# Patient Record
Sex: Female | Born: 1945
Health system: Southern US, Community
[De-identification: ages and names within clinical notes are randomized; demographics above are authoritative.]

## PROBLEM LIST (undated history)

## (undated) DIAGNOSIS — I10 Essential (primary) hypertension: Secondary | ICD-10-CM

## (undated) DIAGNOSIS — E119 Type 2 diabetes mellitus without complications: Secondary | ICD-10-CM

## (undated) DIAGNOSIS — R0902 Hypoxemia: Secondary | ICD-10-CM

## (undated) DIAGNOSIS — I509 Heart failure, unspecified: Secondary | ICD-10-CM

## (undated) DIAGNOSIS — J449 Chronic obstructive pulmonary disease, unspecified: Secondary | ICD-10-CM

## (undated) HISTORY — PX: CHOLECYSTECTOMY: SHX55

## (undated) HISTORY — DX: Chronic obstructive pulmonary disease, unspecified: J44.9

## (undated) HISTORY — DX: Type 2 diabetes mellitus without complications: E11.9

## (undated) HISTORY — DX: Hypoxemia: R09.02

## (undated) HISTORY — PX: ABDOMINAL HYSTERECTOMY: SHX81

---

## 2013-12-12 ENCOUNTER — Other Ambulatory Visit: Payer: Self-pay | Admitting: Gastroenterology

## 2013-12-12 DIAGNOSIS — R935 Abnormal findings on diagnostic imaging of other abdominal regions, including retroperitoneum: Secondary | ICD-10-CM

## 2013-12-12 DIAGNOSIS — N289 Disorder of kidney and ureter, unspecified: Secondary | ICD-10-CM

## 2013-12-22 ENCOUNTER — Other Ambulatory Visit: Payer: Self-pay

## 2013-12-27 ENCOUNTER — Other Ambulatory Visit: Payer: Self-pay

## 2014-01-06 ENCOUNTER — Ambulatory Visit
Admission: RE | Admit: 2014-01-06 | Discharge: 2014-01-06 | Disposition: A | Discharge status: Home / Self Care | Payer: 59 | Source: Ambulatory Visit | Attending: Gastroenterology | Admitting: Gastroenterology

## 2014-01-06 DIAGNOSIS — N289 Disorder of kidney and ureter, unspecified: Secondary | ICD-10-CM

## 2014-01-06 DIAGNOSIS — R935 Abnormal findings on diagnostic imaging of other abdominal regions, including retroperitoneum: Secondary | ICD-10-CM

## 2014-01-30 ENCOUNTER — Other Ambulatory Visit: Payer: Self-pay | Admitting: Gastroenterology

## 2014-01-30 DIAGNOSIS — R9389 Abnormal findings on diagnostic imaging of other specified body structures: Secondary | ICD-10-CM

## 2014-01-30 DIAGNOSIS — D500 Iron deficiency anemia secondary to blood loss (chronic): Secondary | ICD-10-CM

## 2014-02-08 ENCOUNTER — Other Ambulatory Visit: Payer: 59

## 2014-02-08 NOTE — Progress Notes (Signed)
Blood drawn for bun and creat. Pt is for MRI on the weekend. Site is unremarkable at left [facility] and pt tolerated procedure well.

## 2014-02-10 ENCOUNTER — Ambulatory Visit
Admission: RE | Admit: 2014-02-10 | Discharge: 2014-02-10 | Disposition: A | Discharge status: Home / Self Care | Payer: Medicare HMO | Source: Ambulatory Visit | Attending: Gastroenterology | Admitting: Gastroenterology

## 2014-02-10 DIAGNOSIS — D500 Iron deficiency anemia secondary to blood loss (chronic): Secondary | ICD-10-CM

## 2014-02-10 DIAGNOSIS — R9389 Abnormal findings on diagnostic imaging of other specified body structures: Secondary | ICD-10-CM

## 2014-02-10 MED ORDER — GADOBENATE DIMEGLUMINE 529 MG/ML IV SOLN
20.0000 mL | Freq: Once | INTRAVENOUS | Status: AC | PRN
  Administered 2014-02-10: 20 mL via INTRAVENOUS

## 2015-01-05 DIAGNOSIS — R109 Unspecified abdominal pain: Secondary | ICD-10-CM | POA: Diagnosis not present

## 2015-01-05 DIAGNOSIS — G8918 Other acute postprocedural pain: Secondary | ICD-10-CM | POA: Diagnosis not present

## 2015-01-05 DIAGNOSIS — R0602 Shortness of breath: Secondary | ICD-10-CM | POA: Diagnosis not present

## 2015-01-05 DIAGNOSIS — I10 Essential (primary) hypertension: Secondary | ICD-10-CM | POA: Diagnosis not present

## 2015-01-05 DIAGNOSIS — R1011 Right upper quadrant pain: Secondary | ICD-10-CM | POA: Diagnosis not present

## 2015-01-05 DIAGNOSIS — R079 Chest pain, unspecified: Secondary | ICD-10-CM | POA: Diagnosis not present

## 2015-01-05 DIAGNOSIS — E119 Type 2 diabetes mellitus without complications: Secondary | ICD-10-CM | POA: Diagnosis not present

## 2015-01-05 DIAGNOSIS — R162 Hepatomegaly with splenomegaly, not elsewhere classified: Secondary | ICD-10-CM | POA: Diagnosis not present

## 2015-01-05 DIAGNOSIS — J45909 Unspecified asthma, uncomplicated: Secondary | ICD-10-CM | POA: Diagnosis not present

## 2015-01-06 DIAGNOSIS — J449 Chronic obstructive pulmonary disease, unspecified: Secondary | ICD-10-CM | POA: Diagnosis not present

## 2015-01-11 DIAGNOSIS — Z23 Encounter for immunization: Secondary | ICD-10-CM | POA: Diagnosis not present

## 2015-01-11 DIAGNOSIS — Z6841 Body Mass Index (BMI) 40.0 and over, adult: Secondary | ICD-10-CM | POA: Diagnosis not present

## 2015-01-11 DIAGNOSIS — Z789 Other specified health status: Secondary | ICD-10-CM | POA: Diagnosis not present

## 2015-01-11 DIAGNOSIS — J449 Chronic obstructive pulmonary disease, unspecified: Secondary | ICD-10-CM | POA: Diagnosis not present

## 2015-01-11 DIAGNOSIS — Z9049 Acquired absence of other specified parts of digestive tract: Secondary | ICD-10-CM | POA: Diagnosis not present

## 2015-01-17 DIAGNOSIS — M6281 Muscle weakness (generalized): Secondary | ICD-10-CM | POA: Diagnosis not present

## 2015-01-17 DIAGNOSIS — Z789 Other specified health status: Secondary | ICD-10-CM | POA: Diagnosis not present

## 2015-01-17 DIAGNOSIS — R5383 Other fatigue: Secondary | ICD-10-CM | POA: Diagnosis not present

## 2015-01-17 DIAGNOSIS — R262 Difficulty in walking, not elsewhere classified: Secondary | ICD-10-CM | POA: Diagnosis not present

## 2015-01-22 DIAGNOSIS — M6281 Muscle weakness (generalized): Secondary | ICD-10-CM | POA: Diagnosis not present

## 2015-01-22 DIAGNOSIS — R5383 Other fatigue: Secondary | ICD-10-CM | POA: Diagnosis not present

## 2015-01-22 DIAGNOSIS — R262 Difficulty in walking, not elsewhere classified: Secondary | ICD-10-CM | POA: Diagnosis not present

## 2015-01-22 DIAGNOSIS — Z789 Other specified health status: Secondary | ICD-10-CM | POA: Diagnosis not present

## 2015-01-24 DIAGNOSIS — J441 Chronic obstructive pulmonary disease with (acute) exacerbation: Secondary | ICD-10-CM | POA: Diagnosis not present

## 2015-01-24 DIAGNOSIS — R0602 Shortness of breath: Secondary | ICD-10-CM | POA: Diagnosis not present

## 2015-01-24 DIAGNOSIS — Z6841 Body Mass Index (BMI) 40.0 and over, adult: Secondary | ICD-10-CM | POA: Diagnosis not present

## 2015-01-24 DIAGNOSIS — M542 Cervicalgia: Secondary | ICD-10-CM | POA: Diagnosis not present

## 2015-01-24 DIAGNOSIS — M7031 Other bursitis of elbow, right elbow: Secondary | ICD-10-CM | POA: Diagnosis not present

## 2015-01-24 DIAGNOSIS — M25521 Pain in right elbow: Secondary | ICD-10-CM | POA: Diagnosis not present

## 2015-01-25 DIAGNOSIS — M542 Cervicalgia: Secondary | ICD-10-CM | POA: Diagnosis not present

## 2015-01-25 DIAGNOSIS — R0602 Shortness of breath: Secondary | ICD-10-CM | POA: Diagnosis not present

## 2015-01-25 DIAGNOSIS — M25521 Pain in right elbow: Secondary | ICD-10-CM | POA: Diagnosis not present

## 2015-01-25 DIAGNOSIS — M7031 Other bursitis of elbow, right elbow: Secondary | ICD-10-CM | POA: Diagnosis not present

## 2015-01-25 DIAGNOSIS — M25511 Pain in right shoulder: Secondary | ICD-10-CM | POA: Diagnosis not present

## 2015-01-25 DIAGNOSIS — J441 Chronic obstructive pulmonary disease with (acute) exacerbation: Secondary | ICD-10-CM | POA: Diagnosis not present

## 2015-01-30 DIAGNOSIS — Z794 Long term (current) use of insulin: Secondary | ICD-10-CM | POA: Diagnosis not present

## 2015-01-30 DIAGNOSIS — E785 Hyperlipidemia, unspecified: Secondary | ICD-10-CM | POA: Diagnosis not present

## 2015-01-30 DIAGNOSIS — I10 Essential (primary) hypertension: Secondary | ICD-10-CM | POA: Diagnosis not present

## 2015-01-30 DIAGNOSIS — E1165 Type 2 diabetes mellitus with hyperglycemia: Secondary | ICD-10-CM | POA: Diagnosis not present

## 2015-02-01 DIAGNOSIS — R262 Difficulty in walking, not elsewhere classified: Secondary | ICD-10-CM | POA: Diagnosis not present

## 2015-02-01 DIAGNOSIS — M6281 Muscle weakness (generalized): Secondary | ICD-10-CM | POA: Diagnosis not present

## 2015-02-01 DIAGNOSIS — R5383 Other fatigue: Secondary | ICD-10-CM | POA: Diagnosis not present

## 2015-02-01 DIAGNOSIS — Z789 Other specified health status: Secondary | ICD-10-CM | POA: Diagnosis not present

## 2015-02-05 DIAGNOSIS — Z789 Other specified health status: Secondary | ICD-10-CM | POA: Diagnosis not present

## 2015-02-05 DIAGNOSIS — R262 Difficulty in walking, not elsewhere classified: Secondary | ICD-10-CM | POA: Diagnosis not present

## 2015-02-05 DIAGNOSIS — R5383 Other fatigue: Secondary | ICD-10-CM | POA: Diagnosis not present

## 2015-02-05 DIAGNOSIS — M6281 Muscle weakness (generalized): Secondary | ICD-10-CM | POA: Diagnosis not present

## 2015-02-06 DIAGNOSIS — J449 Chronic obstructive pulmonary disease, unspecified: Secondary | ICD-10-CM | POA: Diagnosis not present

## 2015-02-07 DIAGNOSIS — Z789 Other specified health status: Secondary | ICD-10-CM | POA: Diagnosis not present

## 2015-02-07 DIAGNOSIS — M6281 Muscle weakness (generalized): Secondary | ICD-10-CM | POA: Diagnosis not present

## 2015-02-07 DIAGNOSIS — R262 Difficulty in walking, not elsewhere classified: Secondary | ICD-10-CM | POA: Diagnosis not present

## 2015-02-07 DIAGNOSIS — R5383 Other fatigue: Secondary | ICD-10-CM | POA: Diagnosis not present

## 2015-02-10 DIAGNOSIS — M109 Gout, unspecified: Secondary | ICD-10-CM | POA: Diagnosis not present

## 2015-02-14 DIAGNOSIS — R262 Difficulty in walking, not elsewhere classified: Secondary | ICD-10-CM | POA: Diagnosis not present

## 2015-02-14 DIAGNOSIS — M6281 Muscle weakness (generalized): Secondary | ICD-10-CM | POA: Diagnosis not present

## 2015-02-14 DIAGNOSIS — R5383 Other fatigue: Secondary | ICD-10-CM | POA: Diagnosis not present

## 2015-02-14 DIAGNOSIS — Z789 Other specified health status: Secondary | ICD-10-CM | POA: Diagnosis not present

## 2015-02-20 DIAGNOSIS — Z6839 Body mass index (BMI) 39.0-39.9, adult: Secondary | ICD-10-CM | POA: Diagnosis not present

## 2015-02-20 DIAGNOSIS — M25531 Pain in right wrist: Secondary | ICD-10-CM | POA: Diagnosis not present

## 2015-02-20 DIAGNOSIS — R110 Nausea: Secondary | ICD-10-CM | POA: Diagnosis not present

## 2015-02-20 DIAGNOSIS — M19031 Primary osteoarthritis, right wrist: Secondary | ICD-10-CM | POA: Diagnosis not present

## 2015-02-21 DIAGNOSIS — D631 Anemia in chronic kidney disease: Secondary | ICD-10-CM | POA: Diagnosis not present

## 2015-02-21 DIAGNOSIS — N183 Chronic kidney disease, stage 3 (moderate): Secondary | ICD-10-CM | POA: Diagnosis not present

## 2015-02-25 DIAGNOSIS — N289 Disorder of kidney and ureter, unspecified: Secondary | ICD-10-CM | POA: Diagnosis not present

## 2015-02-25 DIAGNOSIS — Z6841 Body Mass Index (BMI) 40.0 and over, adult: Secondary | ICD-10-CM | POA: Diagnosis not present

## 2015-02-25 DIAGNOSIS — D649 Anemia, unspecified: Secondary | ICD-10-CM | POA: Diagnosis not present

## 2015-02-26 DIAGNOSIS — M6281 Muscle weakness (generalized): Secondary | ICD-10-CM | POA: Diagnosis not present

## 2015-02-26 DIAGNOSIS — Z789 Other specified health status: Secondary | ICD-10-CM | POA: Diagnosis not present

## 2015-02-26 DIAGNOSIS — R5383 Other fatigue: Secondary | ICD-10-CM | POA: Diagnosis not present

## 2015-02-26 DIAGNOSIS — R262 Difficulty in walking, not elsewhere classified: Secondary | ICD-10-CM | POA: Diagnosis not present

## 2015-02-27 DIAGNOSIS — Z6839 Body mass index (BMI) 39.0-39.9, adult: Secondary | ICD-10-CM | POA: Diagnosis not present

## 2015-02-27 DIAGNOSIS — D649 Anemia, unspecified: Secondary | ICD-10-CM | POA: Diagnosis not present

## 2015-03-05 DIAGNOSIS — M6281 Muscle weakness (generalized): Secondary | ICD-10-CM | POA: Diagnosis not present

## 2015-03-05 DIAGNOSIS — R262 Difficulty in walking, not elsewhere classified: Secondary | ICD-10-CM | POA: Diagnosis not present

## 2015-03-05 DIAGNOSIS — R5383 Other fatigue: Secondary | ICD-10-CM | POA: Diagnosis not present

## 2015-03-05 DIAGNOSIS — Z789 Other specified health status: Secondary | ICD-10-CM | POA: Diagnosis not present

## 2015-03-07 DIAGNOSIS — R5383 Other fatigue: Secondary | ICD-10-CM | POA: Diagnosis not present

## 2015-03-07 DIAGNOSIS — Z789 Other specified health status: Secondary | ICD-10-CM | POA: Diagnosis not present

## 2015-03-07 DIAGNOSIS — M6281 Muscle weakness (generalized): Secondary | ICD-10-CM | POA: Diagnosis not present

## 2015-03-07 DIAGNOSIS — R262 Difficulty in walking, not elsewhere classified: Secondary | ICD-10-CM | POA: Diagnosis not present

## 2015-03-08 DIAGNOSIS — J449 Chronic obstructive pulmonary disease, unspecified: Secondary | ICD-10-CM | POA: Diagnosis not present

## 2015-03-08 DIAGNOSIS — D509 Iron deficiency anemia, unspecified: Secondary | ICD-10-CM | POA: Diagnosis not present

## 2015-03-11 DIAGNOSIS — M109 Gout, unspecified: Secondary | ICD-10-CM | POA: Diagnosis not present

## 2015-03-11 DIAGNOSIS — Z6839 Body mass index (BMI) 39.0-39.9, adult: Secondary | ICD-10-CM | POA: Diagnosis not present

## 2015-03-11 DIAGNOSIS — N289 Disorder of kidney and ureter, unspecified: Secondary | ICD-10-CM | POA: Diagnosis not present

## 2015-03-11 DIAGNOSIS — R609 Edema, unspecified: Secondary | ICD-10-CM | POA: Diagnosis not present

## 2015-03-27 DIAGNOSIS — M109 Gout, unspecified: Secondary | ICD-10-CM | POA: Diagnosis not present

## 2015-03-27 DIAGNOSIS — Z6839 Body mass index (BMI) 39.0-39.9, adult: Secondary | ICD-10-CM | POA: Diagnosis not present

## 2015-04-08 DIAGNOSIS — J449 Chronic obstructive pulmonary disease, unspecified: Secondary | ICD-10-CM | POA: Diagnosis not present

## 2015-04-10 DIAGNOSIS — Z6839 Body mass index (BMI) 39.0-39.9, adult: Secondary | ICD-10-CM | POA: Diagnosis not present

## 2015-04-10 DIAGNOSIS — D649 Anemia, unspecified: Secondary | ICD-10-CM | POA: Diagnosis not present

## 2015-04-10 DIAGNOSIS — M109 Gout, unspecified: Secondary | ICD-10-CM | POA: Diagnosis not present

## 2015-05-09 DIAGNOSIS — J449 Chronic obstructive pulmonary disease, unspecified: Secondary | ICD-10-CM | POA: Diagnosis not present

## 2015-05-27 DIAGNOSIS — S62101A Fracture of unspecified carpal bone, right wrist, initial encounter for closed fracture: Secondary | ICD-10-CM | POA: Diagnosis not present

## 2015-05-27 DIAGNOSIS — S8391XA Sprain of unspecified site of right knee, initial encounter: Secondary | ICD-10-CM | POA: Diagnosis not present

## 2015-05-27 DIAGNOSIS — S52614A Nondisplaced fracture of right ulna styloid process, initial encounter for closed fracture: Secondary | ICD-10-CM | POA: Diagnosis not present

## 2015-05-27 DIAGNOSIS — M25531 Pain in right wrist: Secondary | ICD-10-CM | POA: Diagnosis not present

## 2015-05-27 DIAGNOSIS — M25461 Effusion, right knee: Secondary | ICD-10-CM | POA: Diagnosis not present

## 2015-05-27 DIAGNOSIS — W010XXA Fall on same level from slipping, tripping and stumbling without subsequent striking against object, initial encounter: Secondary | ICD-10-CM | POA: Diagnosis not present

## 2015-05-27 DIAGNOSIS — M25561 Pain in right knee: Secondary | ICD-10-CM | POA: Diagnosis not present

## 2015-05-27 DIAGNOSIS — T148 Other injury of unspecified body region: Secondary | ICD-10-CM | POA: Diagnosis not present

## 2015-05-29 DIAGNOSIS — S52614A Nondisplaced fracture of right ulna styloid process, initial encounter for closed fracture: Secondary | ICD-10-CM | POA: Diagnosis not present

## 2015-06-05 DIAGNOSIS — K219 Gastro-esophageal reflux disease without esophagitis: Secondary | ICD-10-CM | POA: Diagnosis not present

## 2015-06-05 DIAGNOSIS — E119 Type 2 diabetes mellitus without complications: Secondary | ICD-10-CM | POA: Diagnosis not present

## 2015-06-05 DIAGNOSIS — I10 Essential (primary) hypertension: Secondary | ICD-10-CM | POA: Diagnosis not present

## 2015-06-05 DIAGNOSIS — R5383 Other fatigue: Secondary | ICD-10-CM | POA: Diagnosis not present

## 2015-06-05 DIAGNOSIS — J449 Chronic obstructive pulmonary disease, unspecified: Secondary | ICD-10-CM | POA: Diagnosis not present

## 2015-06-05 DIAGNOSIS — E785 Hyperlipidemia, unspecified: Secondary | ICD-10-CM | POA: Diagnosis not present

## 2015-06-05 DIAGNOSIS — Z1389 Encounter for screening for other disorder: Secondary | ICD-10-CM | POA: Diagnosis not present

## 2015-06-05 DIAGNOSIS — M8580 Other specified disorders of bone density and structure, unspecified site: Secondary | ICD-10-CM | POA: Diagnosis not present

## 2015-06-05 DIAGNOSIS — M109 Gout, unspecified: Secondary | ICD-10-CM | POA: Diagnosis not present

## 2015-06-05 DIAGNOSIS — D649 Anemia, unspecified: Secondary | ICD-10-CM | POA: Diagnosis not present

## 2015-06-05 DIAGNOSIS — Z9181 History of falling: Secondary | ICD-10-CM | POA: Diagnosis not present

## 2015-06-05 DIAGNOSIS — E1343 Other specified diabetes mellitus with diabetic autonomic (poly)neuropathy: Secondary | ICD-10-CM | POA: Diagnosis not present

## 2015-06-06 DIAGNOSIS — D509 Iron deficiency anemia, unspecified: Secondary | ICD-10-CM | POA: Diagnosis not present

## 2015-06-06 DIAGNOSIS — J449 Chronic obstructive pulmonary disease, unspecified: Secondary | ICD-10-CM | POA: Diagnosis not present

## 2015-06-19 DIAGNOSIS — I10 Essential (primary) hypertension: Secondary | ICD-10-CM | POA: Diagnosis not present

## 2015-06-19 DIAGNOSIS — R945 Abnormal results of liver function studies: Secondary | ICD-10-CM | POA: Diagnosis not present

## 2015-06-19 DIAGNOSIS — E785 Hyperlipidemia, unspecified: Secondary | ICD-10-CM | POA: Diagnosis not present

## 2015-06-19 DIAGNOSIS — J449 Chronic obstructive pulmonary disease, unspecified: Secondary | ICD-10-CM | POA: Diagnosis not present

## 2015-06-19 DIAGNOSIS — R609 Edema, unspecified: Secondary | ICD-10-CM | POA: Diagnosis not present

## 2015-06-19 DIAGNOSIS — E119 Type 2 diabetes mellitus without complications: Secondary | ICD-10-CM | POA: Diagnosis not present

## 2015-06-19 DIAGNOSIS — K219 Gastro-esophageal reflux disease without esophagitis: Secondary | ICD-10-CM | POA: Diagnosis not present

## 2015-06-19 DIAGNOSIS — E1343 Other specified diabetes mellitus with diabetic autonomic (poly)neuropathy: Secondary | ICD-10-CM | POA: Diagnosis not present

## 2015-06-19 DIAGNOSIS — M109 Gout, unspecified: Secondary | ICD-10-CM | POA: Diagnosis not present

## 2015-06-19 DIAGNOSIS — M8580 Other specified disorders of bone density and structure, unspecified site: Secondary | ICD-10-CM | POA: Diagnosis not present

## 2015-07-02 DIAGNOSIS — D500 Iron deficiency anemia secondary to blood loss (chronic): Secondary | ICD-10-CM | POA: Diagnosis not present

## 2015-07-02 DIAGNOSIS — K7581 Nonalcoholic steatohepatitis (NASH): Secondary | ICD-10-CM | POA: Diagnosis not present

## 2015-07-03 DIAGNOSIS — E1343 Other specified diabetes mellitus with diabetic autonomic (poly)neuropathy: Secondary | ICD-10-CM | POA: Diagnosis not present

## 2015-07-03 DIAGNOSIS — Z720 Tobacco use: Secondary | ICD-10-CM | POA: Diagnosis not present

## 2015-07-03 DIAGNOSIS — I10 Essential (primary) hypertension: Secondary | ICD-10-CM | POA: Diagnosis not present

## 2015-07-03 DIAGNOSIS — J449 Chronic obstructive pulmonary disease, unspecified: Secondary | ICD-10-CM | POA: Diagnosis not present

## 2015-07-03 DIAGNOSIS — K219 Gastro-esophageal reflux disease without esophagitis: Secondary | ICD-10-CM | POA: Diagnosis not present

## 2015-07-03 DIAGNOSIS — R609 Edema, unspecified: Secondary | ICD-10-CM | POA: Diagnosis not present

## 2015-07-03 DIAGNOSIS — E785 Hyperlipidemia, unspecified: Secondary | ICD-10-CM | POA: Diagnosis not present

## 2015-07-03 DIAGNOSIS — D649 Anemia, unspecified: Secondary | ICD-10-CM | POA: Diagnosis not present

## 2015-07-03 DIAGNOSIS — E119 Type 2 diabetes mellitus without complications: Secondary | ICD-10-CM | POA: Diagnosis not present

## 2015-07-03 DIAGNOSIS — M8580 Other specified disorders of bone density and structure, unspecified site: Secondary | ICD-10-CM | POA: Diagnosis not present

## 2015-07-05 DIAGNOSIS — I10 Essential (primary) hypertension: Secondary | ICD-10-CM | POA: Diagnosis not present

## 2015-07-05 DIAGNOSIS — E785 Hyperlipidemia, unspecified: Secondary | ICD-10-CM | POA: Diagnosis not present

## 2015-07-05 DIAGNOSIS — Z794 Long term (current) use of insulin: Secondary | ICD-10-CM | POA: Diagnosis not present

## 2015-07-05 DIAGNOSIS — E1165 Type 2 diabetes mellitus with hyperglycemia: Secondary | ICD-10-CM | POA: Diagnosis not present

## 2015-07-07 DIAGNOSIS — J449 Chronic obstructive pulmonary disease, unspecified: Secondary | ICD-10-CM | POA: Diagnosis not present

## 2015-08-01 DIAGNOSIS — E1343 Other specified diabetes mellitus with diabetic autonomic (poly)neuropathy: Secondary | ICD-10-CM | POA: Diagnosis not present

## 2015-08-01 DIAGNOSIS — M109 Gout, unspecified: Secondary | ICD-10-CM | POA: Diagnosis not present

## 2015-08-01 DIAGNOSIS — I4510 Unspecified right bundle-branch block: Secondary | ICD-10-CM | POA: Diagnosis not present

## 2015-08-01 DIAGNOSIS — K219 Gastro-esophageal reflux disease without esophagitis: Secondary | ICD-10-CM | POA: Diagnosis not present

## 2015-08-01 DIAGNOSIS — J449 Chronic obstructive pulmonary disease, unspecified: Secondary | ICD-10-CM | POA: Diagnosis not present

## 2015-08-01 DIAGNOSIS — E119 Type 2 diabetes mellitus without complications: Secondary | ICD-10-CM | POA: Diagnosis not present

## 2015-08-01 DIAGNOSIS — E785 Hyperlipidemia, unspecified: Secondary | ICD-10-CM | POA: Diagnosis not present

## 2015-08-01 DIAGNOSIS — M8580 Other specified disorders of bone density and structure, unspecified site: Secondary | ICD-10-CM | POA: Diagnosis not present

## 2015-08-01 DIAGNOSIS — R609 Edema, unspecified: Secondary | ICD-10-CM | POA: Diagnosis not present

## 2015-08-01 DIAGNOSIS — D649 Anemia, unspecified: Secondary | ICD-10-CM | POA: Diagnosis not present

## 2015-08-06 DIAGNOSIS — J449 Chronic obstructive pulmonary disease, unspecified: Secondary | ICD-10-CM | POA: Diagnosis not present

## 2015-08-29 DIAGNOSIS — R609 Edema, unspecified: Secondary | ICD-10-CM | POA: Diagnosis not present

## 2015-08-29 DIAGNOSIS — E785 Hyperlipidemia, unspecified: Secondary | ICD-10-CM | POA: Diagnosis not present

## 2015-08-29 DIAGNOSIS — K219 Gastro-esophageal reflux disease without esophagitis: Secondary | ICD-10-CM | POA: Diagnosis not present

## 2015-08-29 DIAGNOSIS — I739 Peripheral vascular disease, unspecified: Secondary | ICD-10-CM | POA: Diagnosis not present

## 2015-08-29 DIAGNOSIS — I4510 Unspecified right bundle-branch block: Secondary | ICD-10-CM | POA: Diagnosis not present

## 2015-08-29 DIAGNOSIS — E1343 Other specified diabetes mellitus with diabetic autonomic (poly)neuropathy: Secondary | ICD-10-CM | POA: Diagnosis not present

## 2015-08-29 DIAGNOSIS — M8580 Other specified disorders of bone density and structure, unspecified site: Secondary | ICD-10-CM | POA: Diagnosis not present

## 2015-08-29 DIAGNOSIS — E119 Type 2 diabetes mellitus without complications: Secondary | ICD-10-CM | POA: Diagnosis not present

## 2015-08-29 DIAGNOSIS — D649 Anemia, unspecified: Secondary | ICD-10-CM | POA: Diagnosis not present

## 2015-08-29 DIAGNOSIS — J449 Chronic obstructive pulmonary disease, unspecified: Secondary | ICD-10-CM | POA: Diagnosis not present

## 2015-09-06 DIAGNOSIS — J449 Chronic obstructive pulmonary disease, unspecified: Secondary | ICD-10-CM | POA: Diagnosis not present

## 2015-09-12 DIAGNOSIS — E1343 Other specified diabetes mellitus with diabetic autonomic (poly)neuropathy: Secondary | ICD-10-CM | POA: Diagnosis not present

## 2015-09-12 DIAGNOSIS — R945 Abnormal results of liver function studies: Secondary | ICD-10-CM | POA: Diagnosis not present

## 2015-09-12 DIAGNOSIS — I4510 Unspecified right bundle-branch block: Secondary | ICD-10-CM | POA: Diagnosis not present

## 2015-09-12 DIAGNOSIS — R609 Edema, unspecified: Secondary | ICD-10-CM | POA: Diagnosis not present

## 2015-09-12 DIAGNOSIS — K219 Gastro-esophageal reflux disease without esophagitis: Secondary | ICD-10-CM | POA: Diagnosis not present

## 2015-09-12 DIAGNOSIS — J449 Chronic obstructive pulmonary disease, unspecified: Secondary | ICD-10-CM | POA: Diagnosis not present

## 2015-09-12 DIAGNOSIS — M109 Gout, unspecified: Secondary | ICD-10-CM | POA: Diagnosis not present

## 2015-09-12 DIAGNOSIS — D649 Anemia, unspecified: Secondary | ICD-10-CM | POA: Diagnosis not present

## 2015-09-12 DIAGNOSIS — E785 Hyperlipidemia, unspecified: Secondary | ICD-10-CM | POA: Diagnosis not present

## 2015-09-12 DIAGNOSIS — E119 Type 2 diabetes mellitus without complications: Secondary | ICD-10-CM | POA: Diagnosis not present

## 2015-09-17 DIAGNOSIS — J449 Chronic obstructive pulmonary disease, unspecified: Secondary | ICD-10-CM | POA: Diagnosis not present

## 2015-09-17 DIAGNOSIS — Z0000 Encounter for general adult medical examination without abnormal findings: Secondary | ICD-10-CM | POA: Diagnosis not present

## 2015-09-17 DIAGNOSIS — K219 Gastro-esophageal reflux disease without esophagitis: Secondary | ICD-10-CM | POA: Diagnosis not present

## 2015-09-17 DIAGNOSIS — E119 Type 2 diabetes mellitus without complications: Secondary | ICD-10-CM | POA: Diagnosis not present

## 2015-09-17 DIAGNOSIS — Z794 Long term (current) use of insulin: Secondary | ICD-10-CM | POA: Diagnosis not present

## 2015-09-17 DIAGNOSIS — I509 Heart failure, unspecified: Secondary | ICD-10-CM | POA: Diagnosis not present

## 2015-10-06 DIAGNOSIS — J449 Chronic obstructive pulmonary disease, unspecified: Secondary | ICD-10-CM | POA: Diagnosis not present

## 2015-10-10 DIAGNOSIS — Z862 Personal history of diseases of the blood and blood-forming organs and certain disorders involving the immune mechanism: Secondary | ICD-10-CM | POA: Diagnosis not present

## 2015-10-10 DIAGNOSIS — D509 Iron deficiency anemia, unspecified: Secondary | ICD-10-CM | POA: Diagnosis not present

## 2015-10-14 DIAGNOSIS — K219 Gastro-esophageal reflux disease without esophagitis: Secondary | ICD-10-CM | POA: Diagnosis not present

## 2015-10-14 DIAGNOSIS — E785 Hyperlipidemia, unspecified: Secondary | ICD-10-CM | POA: Diagnosis not present

## 2015-10-14 DIAGNOSIS — J449 Chronic obstructive pulmonary disease, unspecified: Secondary | ICD-10-CM | POA: Diagnosis not present

## 2015-10-14 DIAGNOSIS — E119 Type 2 diabetes mellitus without complications: Secondary | ICD-10-CM | POA: Diagnosis not present

## 2015-10-14 DIAGNOSIS — M8580 Other specified disorders of bone density and structure, unspecified site: Secondary | ICD-10-CM | POA: Diagnosis not present

## 2015-10-14 DIAGNOSIS — M109 Gout, unspecified: Secondary | ICD-10-CM | POA: Diagnosis not present

## 2015-10-14 DIAGNOSIS — D649 Anemia, unspecified: Secondary | ICD-10-CM | POA: Diagnosis not present

## 2015-10-14 DIAGNOSIS — I4510 Unspecified right bundle-branch block: Secondary | ICD-10-CM | POA: Diagnosis not present

## 2015-10-14 DIAGNOSIS — R609 Edema, unspecified: Secondary | ICD-10-CM | POA: Diagnosis not present

## 2015-10-14 DIAGNOSIS — E1343 Other specified diabetes mellitus with diabetic autonomic (poly)neuropathy: Secondary | ICD-10-CM | POA: Diagnosis not present

## 2015-10-18 DIAGNOSIS — M1991 Primary osteoarthritis, unspecified site: Secondary | ICD-10-CM | POA: Diagnosis not present

## 2015-10-18 DIAGNOSIS — Z87891 Personal history of nicotine dependence: Secondary | ICD-10-CM | POA: Diagnosis not present

## 2015-10-18 DIAGNOSIS — I10 Essential (primary) hypertension: Secondary | ICD-10-CM | POA: Diagnosis not present

## 2015-10-18 DIAGNOSIS — J45909 Unspecified asthma, uncomplicated: Secondary | ICD-10-CM | POA: Diagnosis not present

## 2015-10-18 DIAGNOSIS — D509 Iron deficiency anemia, unspecified: Secondary | ICD-10-CM | POA: Diagnosis not present

## 2015-10-18 DIAGNOSIS — J449 Chronic obstructive pulmonary disease, unspecified: Secondary | ICD-10-CM | POA: Diagnosis not present

## 2015-10-18 DIAGNOSIS — M10031 Idiopathic gout, right wrist: Secondary | ICD-10-CM | POA: Diagnosis not present

## 2015-10-18 DIAGNOSIS — Z794 Long term (current) use of insulin: Secondary | ICD-10-CM | POA: Diagnosis not present

## 2015-10-18 DIAGNOSIS — E119 Type 2 diabetes mellitus without complications: Secondary | ICD-10-CM | POA: Diagnosis not present

## 2015-10-18 DIAGNOSIS — N19 Unspecified kidney failure: Secondary | ICD-10-CM | POA: Diagnosis not present

## 2015-10-21 DIAGNOSIS — Z87891 Personal history of nicotine dependence: Secondary | ICD-10-CM | POA: Diagnosis not present

## 2015-10-21 DIAGNOSIS — I10 Essential (primary) hypertension: Secondary | ICD-10-CM | POA: Diagnosis not present

## 2015-10-21 DIAGNOSIS — R609 Edema, unspecified: Secondary | ICD-10-CM | POA: Diagnosis not present

## 2015-10-21 DIAGNOSIS — K219 Gastro-esophageal reflux disease without esophagitis: Secondary | ICD-10-CM | POA: Diagnosis not present

## 2015-10-21 DIAGNOSIS — M1991 Primary osteoarthritis, unspecified site: Secondary | ICD-10-CM | POA: Diagnosis not present

## 2015-10-21 DIAGNOSIS — M8580 Other specified disorders of bone density and structure, unspecified site: Secondary | ICD-10-CM | POA: Diagnosis not present

## 2015-10-21 DIAGNOSIS — I4510 Unspecified right bundle-branch block: Secondary | ICD-10-CM | POA: Diagnosis not present

## 2015-10-21 DIAGNOSIS — M109 Gout, unspecified: Secondary | ICD-10-CM | POA: Diagnosis not present

## 2015-10-21 DIAGNOSIS — E119 Type 2 diabetes mellitus without complications: Secondary | ICD-10-CM | POA: Diagnosis not present

## 2015-10-21 DIAGNOSIS — Z794 Long term (current) use of insulin: Secondary | ICD-10-CM | POA: Diagnosis not present

## 2015-10-21 DIAGNOSIS — E785 Hyperlipidemia, unspecified: Secondary | ICD-10-CM | POA: Diagnosis not present

## 2015-10-21 DIAGNOSIS — N19 Unspecified kidney failure: Secondary | ICD-10-CM | POA: Diagnosis not present

## 2015-10-21 DIAGNOSIS — J449 Chronic obstructive pulmonary disease, unspecified: Secondary | ICD-10-CM | POA: Diagnosis not present

## 2015-10-21 DIAGNOSIS — D649 Anemia, unspecified: Secondary | ICD-10-CM | POA: Diagnosis not present

## 2015-10-21 DIAGNOSIS — M10031 Idiopathic gout, right wrist: Secondary | ICD-10-CM | POA: Diagnosis not present

## 2015-10-21 DIAGNOSIS — J45909 Unspecified asthma, uncomplicated: Secondary | ICD-10-CM | POA: Diagnosis not present

## 2015-10-21 DIAGNOSIS — R7989 Other specified abnormal findings of blood chemistry: Secondary | ICD-10-CM | POA: Diagnosis not present

## 2015-10-21 DIAGNOSIS — D509 Iron deficiency anemia, unspecified: Secondary | ICD-10-CM | POA: Diagnosis not present

## 2015-10-23 DIAGNOSIS — Z794 Long term (current) use of insulin: Secondary | ICD-10-CM | POA: Diagnosis not present

## 2015-10-23 DIAGNOSIS — J45909 Unspecified asthma, uncomplicated: Secondary | ICD-10-CM | POA: Diagnosis not present

## 2015-10-23 DIAGNOSIS — E119 Type 2 diabetes mellitus without complications: Secondary | ICD-10-CM | POA: Diagnosis not present

## 2015-10-23 DIAGNOSIS — M1991 Primary osteoarthritis, unspecified site: Secondary | ICD-10-CM | POA: Diagnosis not present

## 2015-10-23 DIAGNOSIS — J449 Chronic obstructive pulmonary disease, unspecified: Secondary | ICD-10-CM | POA: Diagnosis not present

## 2015-10-23 DIAGNOSIS — Z87891 Personal history of nicotine dependence: Secondary | ICD-10-CM | POA: Diagnosis not present

## 2015-10-23 DIAGNOSIS — I10 Essential (primary) hypertension: Secondary | ICD-10-CM | POA: Diagnosis not present

## 2015-10-23 DIAGNOSIS — D509 Iron deficiency anemia, unspecified: Secondary | ICD-10-CM | POA: Diagnosis not present

## 2015-10-23 DIAGNOSIS — M10031 Idiopathic gout, right wrist: Secondary | ICD-10-CM | POA: Diagnosis not present

## 2015-10-23 DIAGNOSIS — N19 Unspecified kidney failure: Secondary | ICD-10-CM | POA: Diagnosis not present

## 2015-10-28 DIAGNOSIS — Z87891 Personal history of nicotine dependence: Secondary | ICD-10-CM | POA: Diagnosis not present

## 2015-10-28 DIAGNOSIS — Z794 Long term (current) use of insulin: Secondary | ICD-10-CM | POA: Diagnosis not present

## 2015-10-28 DIAGNOSIS — I4510 Unspecified right bundle-branch block: Secondary | ICD-10-CM | POA: Diagnosis not present

## 2015-10-28 DIAGNOSIS — J449 Chronic obstructive pulmonary disease, unspecified: Secondary | ICD-10-CM | POA: Diagnosis not present

## 2015-10-28 DIAGNOSIS — M8580 Other specified disorders of bone density and structure, unspecified site: Secondary | ICD-10-CM | POA: Diagnosis not present

## 2015-10-28 DIAGNOSIS — I10 Essential (primary) hypertension: Secondary | ICD-10-CM | POA: Diagnosis not present

## 2015-10-28 DIAGNOSIS — R609 Edema, unspecified: Secondary | ICD-10-CM | POA: Diagnosis not present

## 2015-10-28 DIAGNOSIS — M10031 Idiopathic gout, right wrist: Secondary | ICD-10-CM | POA: Diagnosis not present

## 2015-10-28 DIAGNOSIS — J45909 Unspecified asthma, uncomplicated: Secondary | ICD-10-CM | POA: Diagnosis not present

## 2015-10-28 DIAGNOSIS — E785 Hyperlipidemia, unspecified: Secondary | ICD-10-CM | POA: Diagnosis not present

## 2015-10-28 DIAGNOSIS — D649 Anemia, unspecified: Secondary | ICD-10-CM | POA: Diagnosis not present

## 2015-10-28 DIAGNOSIS — M1991 Primary osteoarthritis, unspecified site: Secondary | ICD-10-CM | POA: Diagnosis not present

## 2015-10-28 DIAGNOSIS — N19 Unspecified kidney failure: Secondary | ICD-10-CM | POA: Diagnosis not present

## 2015-10-28 DIAGNOSIS — K219 Gastro-esophageal reflux disease without esophagitis: Secondary | ICD-10-CM | POA: Diagnosis not present

## 2015-10-28 DIAGNOSIS — D509 Iron deficiency anemia, unspecified: Secondary | ICD-10-CM | POA: Diagnosis not present

## 2015-10-28 DIAGNOSIS — L0390 Cellulitis, unspecified: Secondary | ICD-10-CM | POA: Diagnosis not present

## 2015-10-28 DIAGNOSIS — R7989 Other specified abnormal findings of blood chemistry: Secondary | ICD-10-CM | POA: Diagnosis not present

## 2015-10-28 DIAGNOSIS — E119 Type 2 diabetes mellitus without complications: Secondary | ICD-10-CM | POA: Diagnosis not present

## 2015-10-30 DIAGNOSIS — E119 Type 2 diabetes mellitus without complications: Secondary | ICD-10-CM | POA: Diagnosis not present

## 2015-10-30 DIAGNOSIS — N19 Unspecified kidney failure: Secondary | ICD-10-CM | POA: Diagnosis not present

## 2015-10-30 DIAGNOSIS — M1991 Primary osteoarthritis, unspecified site: Secondary | ICD-10-CM | POA: Diagnosis not present

## 2015-10-30 DIAGNOSIS — J45909 Unspecified asthma, uncomplicated: Secondary | ICD-10-CM | POA: Diagnosis not present

## 2015-10-30 DIAGNOSIS — J449 Chronic obstructive pulmonary disease, unspecified: Secondary | ICD-10-CM | POA: Diagnosis not present

## 2015-10-30 DIAGNOSIS — I10 Essential (primary) hypertension: Secondary | ICD-10-CM | POA: Diagnosis not present

## 2015-10-30 DIAGNOSIS — Z87891 Personal history of nicotine dependence: Secondary | ICD-10-CM | POA: Diagnosis not present

## 2015-10-30 DIAGNOSIS — M10031 Idiopathic gout, right wrist: Secondary | ICD-10-CM | POA: Diagnosis not present

## 2015-10-30 DIAGNOSIS — D509 Iron deficiency anemia, unspecified: Secondary | ICD-10-CM | POA: Diagnosis not present

## 2015-10-30 DIAGNOSIS — Z794 Long term (current) use of insulin: Secondary | ICD-10-CM | POA: Diagnosis not present

## 2015-11-04 DIAGNOSIS — N19 Unspecified kidney failure: Secondary | ICD-10-CM | POA: Diagnosis not present

## 2015-11-04 DIAGNOSIS — J449 Chronic obstructive pulmonary disease, unspecified: Secondary | ICD-10-CM | POA: Diagnosis not present

## 2015-11-04 DIAGNOSIS — Z794 Long term (current) use of insulin: Secondary | ICD-10-CM | POA: Diagnosis not present

## 2015-11-04 DIAGNOSIS — D509 Iron deficiency anemia, unspecified: Secondary | ICD-10-CM | POA: Diagnosis not present

## 2015-11-04 DIAGNOSIS — Z87891 Personal history of nicotine dependence: Secondary | ICD-10-CM | POA: Diagnosis not present

## 2015-11-04 DIAGNOSIS — M10031 Idiopathic gout, right wrist: Secondary | ICD-10-CM | POA: Diagnosis not present

## 2015-11-04 DIAGNOSIS — M1991 Primary osteoarthritis, unspecified site: Secondary | ICD-10-CM | POA: Diagnosis not present

## 2015-11-04 DIAGNOSIS — I10 Essential (primary) hypertension: Secondary | ICD-10-CM | POA: Diagnosis not present

## 2015-11-04 DIAGNOSIS — E119 Type 2 diabetes mellitus without complications: Secondary | ICD-10-CM | POA: Diagnosis not present

## 2015-11-04 DIAGNOSIS — J45909 Unspecified asthma, uncomplicated: Secondary | ICD-10-CM | POA: Diagnosis not present

## 2015-11-05 DIAGNOSIS — D649 Anemia, unspecified: Secondary | ICD-10-CM | POA: Diagnosis not present

## 2015-11-05 DIAGNOSIS — J449 Chronic obstructive pulmonary disease, unspecified: Secondary | ICD-10-CM | POA: Diagnosis not present

## 2015-11-05 DIAGNOSIS — E119 Type 2 diabetes mellitus without complications: Secondary | ICD-10-CM | POA: Diagnosis not present

## 2015-11-05 DIAGNOSIS — L0390 Cellulitis, unspecified: Secondary | ICD-10-CM | POA: Diagnosis not present

## 2015-11-05 DIAGNOSIS — M8580 Other specified disorders of bone density and structure, unspecified site: Secondary | ICD-10-CM | POA: Diagnosis not present

## 2015-11-05 DIAGNOSIS — E785 Hyperlipidemia, unspecified: Secondary | ICD-10-CM | POA: Diagnosis not present

## 2015-11-05 DIAGNOSIS — R609 Edema, unspecified: Secondary | ICD-10-CM | POA: Diagnosis not present

## 2015-11-05 DIAGNOSIS — I4510 Unspecified right bundle-branch block: Secondary | ICD-10-CM | POA: Diagnosis not present

## 2015-11-05 DIAGNOSIS — K219 Gastro-esophageal reflux disease without esophagitis: Secondary | ICD-10-CM | POA: Diagnosis not present

## 2015-11-05 DIAGNOSIS — R7989 Other specified abnormal findings of blood chemistry: Secondary | ICD-10-CM | POA: Diagnosis not present

## 2015-11-06 DIAGNOSIS — M1991 Primary osteoarthritis, unspecified site: Secondary | ICD-10-CM | POA: Diagnosis not present

## 2015-11-06 DIAGNOSIS — J449 Chronic obstructive pulmonary disease, unspecified: Secondary | ICD-10-CM | POA: Diagnosis not present

## 2015-11-06 DIAGNOSIS — N19 Unspecified kidney failure: Secondary | ICD-10-CM | POA: Diagnosis not present

## 2015-11-06 DIAGNOSIS — E119 Type 2 diabetes mellitus without complications: Secondary | ICD-10-CM | POA: Diagnosis not present

## 2015-11-06 DIAGNOSIS — Z794 Long term (current) use of insulin: Secondary | ICD-10-CM | POA: Diagnosis not present

## 2015-11-06 DIAGNOSIS — J45909 Unspecified asthma, uncomplicated: Secondary | ICD-10-CM | POA: Diagnosis not present

## 2015-11-06 DIAGNOSIS — M10031 Idiopathic gout, right wrist: Secondary | ICD-10-CM | POA: Diagnosis not present

## 2015-11-06 DIAGNOSIS — D509 Iron deficiency anemia, unspecified: Secondary | ICD-10-CM | POA: Diagnosis not present

## 2015-11-06 DIAGNOSIS — Z87891 Personal history of nicotine dependence: Secondary | ICD-10-CM | POA: Diagnosis not present

## 2015-11-06 DIAGNOSIS — I10 Essential (primary) hypertension: Secondary | ICD-10-CM | POA: Diagnosis not present

## 2015-11-11 DIAGNOSIS — D509 Iron deficiency anemia, unspecified: Secondary | ICD-10-CM | POA: Diagnosis not present

## 2015-11-11 DIAGNOSIS — M1991 Primary osteoarthritis, unspecified site: Secondary | ICD-10-CM | POA: Diagnosis not present

## 2015-11-11 DIAGNOSIS — M10031 Idiopathic gout, right wrist: Secondary | ICD-10-CM | POA: Diagnosis not present

## 2015-11-11 DIAGNOSIS — J45909 Unspecified asthma, uncomplicated: Secondary | ICD-10-CM | POA: Diagnosis not present

## 2015-11-11 DIAGNOSIS — I10 Essential (primary) hypertension: Secondary | ICD-10-CM | POA: Diagnosis not present

## 2015-11-11 DIAGNOSIS — N19 Unspecified kidney failure: Secondary | ICD-10-CM | POA: Diagnosis not present

## 2015-11-11 DIAGNOSIS — J449 Chronic obstructive pulmonary disease, unspecified: Secondary | ICD-10-CM | POA: Diagnosis not present

## 2015-11-11 DIAGNOSIS — Z794 Long term (current) use of insulin: Secondary | ICD-10-CM | POA: Diagnosis not present

## 2015-11-11 DIAGNOSIS — E119 Type 2 diabetes mellitus without complications: Secondary | ICD-10-CM | POA: Diagnosis not present

## 2015-11-11 DIAGNOSIS — Z87891 Personal history of nicotine dependence: Secondary | ICD-10-CM | POA: Diagnosis not present

## 2015-11-14 DIAGNOSIS — J45909 Unspecified asthma, uncomplicated: Secondary | ICD-10-CM | POA: Diagnosis not present

## 2015-11-14 DIAGNOSIS — M1991 Primary osteoarthritis, unspecified site: Secondary | ICD-10-CM | POA: Diagnosis not present

## 2015-11-14 DIAGNOSIS — N19 Unspecified kidney failure: Secondary | ICD-10-CM | POA: Diagnosis not present

## 2015-11-14 DIAGNOSIS — M10031 Idiopathic gout, right wrist: Secondary | ICD-10-CM | POA: Diagnosis not present

## 2015-11-14 DIAGNOSIS — Z87891 Personal history of nicotine dependence: Secondary | ICD-10-CM | POA: Diagnosis not present

## 2015-11-14 DIAGNOSIS — J449 Chronic obstructive pulmonary disease, unspecified: Secondary | ICD-10-CM | POA: Diagnosis not present

## 2015-11-14 DIAGNOSIS — Z794 Long term (current) use of insulin: Secondary | ICD-10-CM | POA: Diagnosis not present

## 2015-11-14 DIAGNOSIS — E119 Type 2 diabetes mellitus without complications: Secondary | ICD-10-CM | POA: Diagnosis not present

## 2015-11-14 DIAGNOSIS — D509 Iron deficiency anemia, unspecified: Secondary | ICD-10-CM | POA: Diagnosis not present

## 2015-11-14 DIAGNOSIS — I10 Essential (primary) hypertension: Secondary | ICD-10-CM | POA: Diagnosis not present

## 2015-11-19 DIAGNOSIS — E113299 Type 2 diabetes mellitus with mild nonproliferative diabetic retinopathy without macular edema, unspecified eye: Secondary | ICD-10-CM | POA: Diagnosis not present

## 2015-11-21 DIAGNOSIS — M8580 Other specified disorders of bone density and structure, unspecified site: Secondary | ICD-10-CM | POA: Diagnosis not present

## 2015-11-21 DIAGNOSIS — K219 Gastro-esophageal reflux disease without esophagitis: Secondary | ICD-10-CM | POA: Diagnosis not present

## 2015-11-21 DIAGNOSIS — D649 Anemia, unspecified: Secondary | ICD-10-CM | POA: Diagnosis not present

## 2015-11-21 DIAGNOSIS — J449 Chronic obstructive pulmonary disease, unspecified: Secondary | ICD-10-CM | POA: Diagnosis not present

## 2015-11-21 DIAGNOSIS — R7989 Other specified abnormal findings of blood chemistry: Secondary | ICD-10-CM | POA: Diagnosis not present

## 2015-11-21 DIAGNOSIS — I10 Essential (primary) hypertension: Secondary | ICD-10-CM | POA: Diagnosis not present

## 2015-11-21 DIAGNOSIS — I4510 Unspecified right bundle-branch block: Secondary | ICD-10-CM | POA: Diagnosis not present

## 2015-11-21 DIAGNOSIS — R609 Edema, unspecified: Secondary | ICD-10-CM | POA: Diagnosis not present

## 2015-11-21 DIAGNOSIS — E785 Hyperlipidemia, unspecified: Secondary | ICD-10-CM | POA: Diagnosis not present

## 2015-11-21 DIAGNOSIS — E119 Type 2 diabetes mellitus without complications: Secondary | ICD-10-CM | POA: Diagnosis not present

## 2015-12-04 DIAGNOSIS — Z6841 Body Mass Index (BMI) 40.0 and over, adult: Secondary | ICD-10-CM | POA: Diagnosis not present

## 2015-12-04 DIAGNOSIS — E785 Hyperlipidemia, unspecified: Secondary | ICD-10-CM | POA: Diagnosis not present

## 2015-12-04 DIAGNOSIS — E1165 Type 2 diabetes mellitus with hyperglycemia: Secondary | ICD-10-CM | POA: Diagnosis not present

## 2015-12-04 DIAGNOSIS — Z794 Long term (current) use of insulin: Secondary | ICD-10-CM | POA: Diagnosis not present

## 2015-12-04 DIAGNOSIS — I10 Essential (primary) hypertension: Secondary | ICD-10-CM | POA: Diagnosis not present

## 2015-12-07 DIAGNOSIS — J449 Chronic obstructive pulmonary disease, unspecified: Secondary | ICD-10-CM | POA: Diagnosis not present

## 2015-12-23 DIAGNOSIS — J449 Chronic obstructive pulmonary disease, unspecified: Secondary | ICD-10-CM | POA: Diagnosis not present

## 2015-12-23 DIAGNOSIS — R609 Edema, unspecified: Secondary | ICD-10-CM | POA: Diagnosis not present

## 2015-12-23 DIAGNOSIS — K219 Gastro-esophageal reflux disease without esophagitis: Secondary | ICD-10-CM | POA: Diagnosis not present

## 2015-12-23 DIAGNOSIS — R7989 Other specified abnormal findings of blood chemistry: Secondary | ICD-10-CM | POA: Diagnosis not present

## 2015-12-23 DIAGNOSIS — E119 Type 2 diabetes mellitus without complications: Secondary | ICD-10-CM | POA: Diagnosis not present

## 2015-12-23 DIAGNOSIS — M109 Gout, unspecified: Secondary | ICD-10-CM | POA: Diagnosis not present

## 2015-12-23 DIAGNOSIS — M8580 Other specified disorders of bone density and structure, unspecified site: Secondary | ICD-10-CM | POA: Diagnosis not present

## 2015-12-23 DIAGNOSIS — E785 Hyperlipidemia, unspecified: Secondary | ICD-10-CM | POA: Diagnosis not present

## 2015-12-23 DIAGNOSIS — I4510 Unspecified right bundle-branch block: Secondary | ICD-10-CM | POA: Diagnosis not present

## 2015-12-23 DIAGNOSIS — I10 Essential (primary) hypertension: Secondary | ICD-10-CM | POA: Diagnosis not present

## 2015-12-23 DIAGNOSIS — D649 Anemia, unspecified: Secondary | ICD-10-CM | POA: Diagnosis not present

## 2015-12-23 DIAGNOSIS — L0390 Cellulitis, unspecified: Secondary | ICD-10-CM | POA: Diagnosis not present

## 2016-01-06 DIAGNOSIS — J449 Chronic obstructive pulmonary disease, unspecified: Secondary | ICD-10-CM | POA: Diagnosis not present

## 2016-01-20 DIAGNOSIS — R609 Edema, unspecified: Secondary | ICD-10-CM | POA: Diagnosis not present

## 2016-01-20 DIAGNOSIS — K219 Gastro-esophageal reflux disease without esophagitis: Secondary | ICD-10-CM | POA: Diagnosis not present

## 2016-01-20 DIAGNOSIS — D649 Anemia, unspecified: Secondary | ICD-10-CM | POA: Diagnosis not present

## 2016-01-20 DIAGNOSIS — I4510 Unspecified right bundle-branch block: Secondary | ICD-10-CM | POA: Diagnosis not present

## 2016-01-20 DIAGNOSIS — J449 Chronic obstructive pulmonary disease, unspecified: Secondary | ICD-10-CM | POA: Diagnosis not present

## 2016-01-20 DIAGNOSIS — R7989 Other specified abnormal findings of blood chemistry: Secondary | ICD-10-CM | POA: Diagnosis not present

## 2016-01-20 DIAGNOSIS — M8580 Other specified disorders of bone density and structure, unspecified site: Secondary | ICD-10-CM | POA: Diagnosis not present

## 2016-01-20 DIAGNOSIS — L0390 Cellulitis, unspecified: Secondary | ICD-10-CM | POA: Diagnosis not present

## 2016-01-20 DIAGNOSIS — E785 Hyperlipidemia, unspecified: Secondary | ICD-10-CM | POA: Diagnosis not present

## 2016-01-20 DIAGNOSIS — E119 Type 2 diabetes mellitus without complications: Secondary | ICD-10-CM | POA: Diagnosis not present

## 2016-02-04 DIAGNOSIS — E785 Hyperlipidemia, unspecified: Secondary | ICD-10-CM | POA: Diagnosis not present

## 2016-02-04 DIAGNOSIS — K219 Gastro-esophageal reflux disease without esophagitis: Secondary | ICD-10-CM | POA: Diagnosis not present

## 2016-02-04 DIAGNOSIS — R609 Edema, unspecified: Secondary | ICD-10-CM | POA: Diagnosis not present

## 2016-02-04 DIAGNOSIS — E119 Type 2 diabetes mellitus without complications: Secondary | ICD-10-CM | POA: Diagnosis not present

## 2016-02-04 DIAGNOSIS — M8580 Other specified disorders of bone density and structure, unspecified site: Secondary | ICD-10-CM | POA: Diagnosis not present

## 2016-02-04 DIAGNOSIS — L0390 Cellulitis, unspecified: Secondary | ICD-10-CM | POA: Diagnosis not present

## 2016-02-04 DIAGNOSIS — D649 Anemia, unspecified: Secondary | ICD-10-CM | POA: Diagnosis not present

## 2016-02-04 DIAGNOSIS — I4510 Unspecified right bundle-branch block: Secondary | ICD-10-CM | POA: Diagnosis not present

## 2016-02-04 DIAGNOSIS — J449 Chronic obstructive pulmonary disease, unspecified: Secondary | ICD-10-CM | POA: Diagnosis not present

## 2016-02-04 DIAGNOSIS — Z23 Encounter for immunization: Secondary | ICD-10-CM | POA: Diagnosis not present

## 2016-02-06 DIAGNOSIS — J449 Chronic obstructive pulmonary disease, unspecified: Secondary | ICD-10-CM | POA: Diagnosis not present

## 2016-02-18 DIAGNOSIS — J449 Chronic obstructive pulmonary disease, unspecified: Secondary | ICD-10-CM | POA: Diagnosis not present

## 2016-02-18 DIAGNOSIS — L0390 Cellulitis, unspecified: Secondary | ICD-10-CM | POA: Diagnosis not present

## 2016-02-18 DIAGNOSIS — R609 Edema, unspecified: Secondary | ICD-10-CM | POA: Diagnosis not present

## 2016-02-18 DIAGNOSIS — M8580 Other specified disorders of bone density and structure, unspecified site: Secondary | ICD-10-CM | POA: Diagnosis not present

## 2016-02-18 DIAGNOSIS — E785 Hyperlipidemia, unspecified: Secondary | ICD-10-CM | POA: Diagnosis not present

## 2016-02-18 DIAGNOSIS — I4510 Unspecified right bundle-branch block: Secondary | ICD-10-CM | POA: Diagnosis not present

## 2016-02-18 DIAGNOSIS — R7989 Other specified abnormal findings of blood chemistry: Secondary | ICD-10-CM | POA: Diagnosis not present

## 2016-02-18 DIAGNOSIS — E119 Type 2 diabetes mellitus without complications: Secondary | ICD-10-CM | POA: Diagnosis not present

## 2016-02-18 DIAGNOSIS — K219 Gastro-esophageal reflux disease without esophagitis: Secondary | ICD-10-CM | POA: Diagnosis not present

## 2016-02-18 DIAGNOSIS — D649 Anemia, unspecified: Secondary | ICD-10-CM | POA: Diagnosis not present

## 2016-03-07 DIAGNOSIS — J449 Chronic obstructive pulmonary disease, unspecified: Secondary | ICD-10-CM | POA: Diagnosis not present

## 2016-04-07 DIAGNOSIS — J449 Chronic obstructive pulmonary disease, unspecified: Secondary | ICD-10-CM | POA: Diagnosis not present

## 2016-04-17 DIAGNOSIS — D509 Iron deficiency anemia, unspecified: Secondary | ICD-10-CM | POA: Diagnosis not present

## 2016-04-21 DIAGNOSIS — D509 Iron deficiency anemia, unspecified: Secondary | ICD-10-CM | POA: Diagnosis not present

## 2016-04-27 DIAGNOSIS — K219 Gastro-esophageal reflux disease without esophagitis: Secondary | ICD-10-CM | POA: Diagnosis not present

## 2016-04-27 DIAGNOSIS — I4510 Unspecified right bundle-branch block: Secondary | ICD-10-CM | POA: Diagnosis not present

## 2016-04-27 DIAGNOSIS — E785 Hyperlipidemia, unspecified: Secondary | ICD-10-CM | POA: Diagnosis not present

## 2016-04-27 DIAGNOSIS — M8580 Other specified disorders of bone density and structure, unspecified site: Secondary | ICD-10-CM | POA: Diagnosis not present

## 2016-04-27 DIAGNOSIS — D649 Anemia, unspecified: Secondary | ICD-10-CM | POA: Diagnosis not present

## 2016-04-27 DIAGNOSIS — J449 Chronic obstructive pulmonary disease, unspecified: Secondary | ICD-10-CM | POA: Diagnosis not present

## 2016-04-27 DIAGNOSIS — R7989 Other specified abnormal findings of blood chemistry: Secondary | ICD-10-CM | POA: Diagnosis not present

## 2016-04-27 DIAGNOSIS — R609 Edema, unspecified: Secondary | ICD-10-CM | POA: Diagnosis not present

## 2016-04-27 DIAGNOSIS — R300 Dysuria: Secondary | ICD-10-CM | POA: Diagnosis not present

## 2016-04-27 DIAGNOSIS — E119 Type 2 diabetes mellitus without complications: Secondary | ICD-10-CM | POA: Diagnosis not present

## 2016-04-28 DIAGNOSIS — D509 Iron deficiency anemia, unspecified: Secondary | ICD-10-CM | POA: Diagnosis not present

## 2016-05-04 DIAGNOSIS — I10 Essential (primary) hypertension: Secondary | ICD-10-CM | POA: Diagnosis not present

## 2016-05-04 DIAGNOSIS — D649 Anemia, unspecified: Secondary | ICD-10-CM | POA: Diagnosis not present

## 2016-05-04 DIAGNOSIS — R7989 Other specified abnormal findings of blood chemistry: Secondary | ICD-10-CM | POA: Diagnosis not present

## 2016-05-04 DIAGNOSIS — I4510 Unspecified right bundle-branch block: Secondary | ICD-10-CM | POA: Diagnosis not present

## 2016-05-04 DIAGNOSIS — M8580 Other specified disorders of bone density and structure, unspecified site: Secondary | ICD-10-CM | POA: Diagnosis not present

## 2016-05-04 DIAGNOSIS — E785 Hyperlipidemia, unspecified: Secondary | ICD-10-CM | POA: Diagnosis not present

## 2016-05-04 DIAGNOSIS — E119 Type 2 diabetes mellitus without complications: Secondary | ICD-10-CM | POA: Diagnosis not present

## 2016-05-04 DIAGNOSIS — R609 Edema, unspecified: Secondary | ICD-10-CM | POA: Diagnosis not present

## 2016-05-04 DIAGNOSIS — J449 Chronic obstructive pulmonary disease, unspecified: Secondary | ICD-10-CM | POA: Diagnosis not present

## 2016-05-04 DIAGNOSIS — K219 Gastro-esophageal reflux disease without esophagitis: Secondary | ICD-10-CM | POA: Diagnosis not present

## 2016-05-08 DIAGNOSIS — J449 Chronic obstructive pulmonary disease, unspecified: Secondary | ICD-10-CM | POA: Diagnosis not present

## 2016-05-11 DIAGNOSIS — E119 Type 2 diabetes mellitus without complications: Secondary | ICD-10-CM | POA: Diagnosis not present

## 2016-05-11 DIAGNOSIS — D649 Anemia, unspecified: Secondary | ICD-10-CM | POA: Diagnosis not present

## 2016-05-11 DIAGNOSIS — J449 Chronic obstructive pulmonary disease, unspecified: Secondary | ICD-10-CM | POA: Diagnosis not present

## 2016-05-11 DIAGNOSIS — E785 Hyperlipidemia, unspecified: Secondary | ICD-10-CM | POA: Diagnosis not present

## 2016-05-11 DIAGNOSIS — I4510 Unspecified right bundle-branch block: Secondary | ICD-10-CM | POA: Diagnosis not present

## 2016-05-11 DIAGNOSIS — K219 Gastro-esophageal reflux disease without esophagitis: Secondary | ICD-10-CM | POA: Diagnosis not present

## 2016-05-11 DIAGNOSIS — R7989 Other specified abnormal findings of blood chemistry: Secondary | ICD-10-CM | POA: Diagnosis not present

## 2016-05-11 DIAGNOSIS — J208 Acute bronchitis due to other specified organisms: Secondary | ICD-10-CM | POA: Diagnosis not present

## 2016-05-11 DIAGNOSIS — R609 Edema, unspecified: Secondary | ICD-10-CM | POA: Diagnosis not present

## 2016-05-11 DIAGNOSIS — M8580 Other specified disorders of bone density and structure, unspecified site: Secondary | ICD-10-CM | POA: Diagnosis not present

## 2016-05-19 DIAGNOSIS — E119 Type 2 diabetes mellitus without complications: Secondary | ICD-10-CM | POA: Diagnosis not present

## 2016-05-19 DIAGNOSIS — K219 Gastro-esophageal reflux disease without esophagitis: Secondary | ICD-10-CM | POA: Diagnosis not present

## 2016-05-19 DIAGNOSIS — R609 Edema, unspecified: Secondary | ICD-10-CM | POA: Diagnosis not present

## 2016-05-19 DIAGNOSIS — J449 Chronic obstructive pulmonary disease, unspecified: Secondary | ICD-10-CM | POA: Diagnosis not present

## 2016-05-19 DIAGNOSIS — I10 Essential (primary) hypertension: Secondary | ICD-10-CM | POA: Diagnosis not present

## 2016-05-19 DIAGNOSIS — I4510 Unspecified right bundle-branch block: Secondary | ICD-10-CM | POA: Diagnosis not present

## 2016-05-19 DIAGNOSIS — E785 Hyperlipidemia, unspecified: Secondary | ICD-10-CM | POA: Diagnosis not present

## 2016-05-19 DIAGNOSIS — R7989 Other specified abnormal findings of blood chemistry: Secondary | ICD-10-CM | POA: Diagnosis not present

## 2016-05-19 DIAGNOSIS — D649 Anemia, unspecified: Secondary | ICD-10-CM | POA: Diagnosis not present

## 2016-05-19 DIAGNOSIS — M8580 Other specified disorders of bone density and structure, unspecified site: Secondary | ICD-10-CM | POA: Diagnosis not present

## 2016-05-21 DIAGNOSIS — E785 Hyperlipidemia, unspecified: Secondary | ICD-10-CM | POA: Diagnosis not present

## 2016-05-21 DIAGNOSIS — I10 Essential (primary) hypertension: Secondary | ICD-10-CM | POA: Diagnosis not present

## 2016-05-21 DIAGNOSIS — E1165 Type 2 diabetes mellitus with hyperglycemia: Secondary | ICD-10-CM | POA: Diagnosis not present

## 2016-05-21 DIAGNOSIS — Z794 Long term (current) use of insulin: Secondary | ICD-10-CM | POA: Diagnosis not present

## 2016-05-26 DIAGNOSIS — K219 Gastro-esophageal reflux disease without esophagitis: Secondary | ICD-10-CM | POA: Diagnosis not present

## 2016-05-26 DIAGNOSIS — E119 Type 2 diabetes mellitus without complications: Secondary | ICD-10-CM | POA: Diagnosis not present

## 2016-05-26 DIAGNOSIS — I450 Right fascicular block: Secondary | ICD-10-CM | POA: Diagnosis not present

## 2016-05-26 DIAGNOSIS — M8580 Other specified disorders of bone density and structure, unspecified site: Secondary | ICD-10-CM | POA: Diagnosis not present

## 2016-05-26 DIAGNOSIS — D649 Anemia, unspecified: Secondary | ICD-10-CM | POA: Diagnosis not present

## 2016-05-26 DIAGNOSIS — E785 Hyperlipidemia, unspecified: Secondary | ICD-10-CM | POA: Diagnosis not present

## 2016-05-26 DIAGNOSIS — R7989 Other specified abnormal findings of blood chemistry: Secondary | ICD-10-CM | POA: Diagnosis not present

## 2016-05-26 DIAGNOSIS — E1343 Other specified diabetes mellitus with diabetic autonomic (poly)neuropathy: Secondary | ICD-10-CM | POA: Diagnosis not present

## 2016-05-26 DIAGNOSIS — J449 Chronic obstructive pulmonary disease, unspecified: Secondary | ICD-10-CM | POA: Diagnosis not present

## 2016-05-26 DIAGNOSIS — R609 Edema, unspecified: Secondary | ICD-10-CM | POA: Diagnosis not present

## 2016-06-02 DIAGNOSIS — I509 Heart failure, unspecified: Secondary | ICD-10-CM | POA: Diagnosis not present

## 2016-06-02 DIAGNOSIS — M1A9XX0 Chronic gout, unspecified, without tophus (tophi): Secondary | ICD-10-CM | POA: Diagnosis not present

## 2016-06-02 DIAGNOSIS — Z794 Long term (current) use of insulin: Secondary | ICD-10-CM | POA: Diagnosis not present

## 2016-06-02 DIAGNOSIS — Z9181 History of falling: Secondary | ICD-10-CM | POA: Diagnosis not present

## 2016-06-02 DIAGNOSIS — Z862 Personal history of diseases of the blood and blood-forming organs and certain disorders involving the immune mechanism: Secondary | ICD-10-CM | POA: Diagnosis not present

## 2016-06-02 DIAGNOSIS — K5900 Constipation, unspecified: Secondary | ICD-10-CM | POA: Diagnosis not present

## 2016-06-02 DIAGNOSIS — Z87891 Personal history of nicotine dependence: Secondary | ICD-10-CM | POA: Diagnosis not present

## 2016-06-02 DIAGNOSIS — J449 Chronic obstructive pulmonary disease, unspecified: Secondary | ICD-10-CM | POA: Diagnosis not present

## 2016-06-02 DIAGNOSIS — Z79899 Other long term (current) drug therapy: Secondary | ICD-10-CM | POA: Diagnosis not present

## 2016-06-02 DIAGNOSIS — E1141 Type 2 diabetes mellitus with diabetic mononeuropathy: Secondary | ICD-10-CM | POA: Diagnosis not present

## 2016-06-02 DIAGNOSIS — K219 Gastro-esophageal reflux disease without esophagitis: Secondary | ICD-10-CM | POA: Diagnosis not present

## 2016-06-02 DIAGNOSIS — Z0000 Encounter for general adult medical examination without abnormal findings: Secondary | ICD-10-CM | POA: Diagnosis not present

## 2016-06-02 DIAGNOSIS — Z7982 Long term (current) use of aspirin: Secondary | ICD-10-CM | POA: Diagnosis not present

## 2016-06-02 DIAGNOSIS — D509 Iron deficiency anemia, unspecified: Secondary | ICD-10-CM | POA: Diagnosis not present

## 2016-06-02 DIAGNOSIS — L853 Xerosis cutis: Secondary | ICD-10-CM | POA: Diagnosis not present

## 2016-06-02 DIAGNOSIS — Z9981 Dependence on supplemental oxygen: Secondary | ICD-10-CM | POA: Diagnosis not present

## 2016-06-02 DIAGNOSIS — Z7401 Bed confinement status: Secondary | ICD-10-CM | POA: Diagnosis not present

## 2016-06-02 DIAGNOSIS — Z8781 Personal history of (healed) traumatic fracture: Secondary | ICD-10-CM | POA: Diagnosis not present

## 2016-06-03 DIAGNOSIS — R945 Abnormal results of liver function studies: Secondary | ICD-10-CM | POA: Diagnosis not present

## 2016-06-03 DIAGNOSIS — R7989 Other specified abnormal findings of blood chemistry: Secondary | ICD-10-CM | POA: Diagnosis not present

## 2016-06-03 DIAGNOSIS — R748 Abnormal levels of other serum enzymes: Secondary | ICD-10-CM | POA: Diagnosis not present

## 2016-06-05 DIAGNOSIS — J449 Chronic obstructive pulmonary disease, unspecified: Secondary | ICD-10-CM | POA: Diagnosis not present

## 2016-06-09 DIAGNOSIS — J449 Chronic obstructive pulmonary disease, unspecified: Secondary | ICD-10-CM | POA: Diagnosis not present

## 2016-06-09 DIAGNOSIS — K219 Gastro-esophageal reflux disease without esophagitis: Secondary | ICD-10-CM | POA: Diagnosis not present

## 2016-06-09 DIAGNOSIS — D649 Anemia, unspecified: Secondary | ICD-10-CM | POA: Diagnosis not present

## 2016-06-09 DIAGNOSIS — E785 Hyperlipidemia, unspecified: Secondary | ICD-10-CM | POA: Diagnosis not present

## 2016-06-09 DIAGNOSIS — R609 Edema, unspecified: Secondary | ICD-10-CM | POA: Diagnosis not present

## 2016-06-09 DIAGNOSIS — M8580 Other specified disorders of bone density and structure, unspecified site: Secondary | ICD-10-CM | POA: Diagnosis not present

## 2016-06-09 DIAGNOSIS — J208 Acute bronchitis due to other specified organisms: Secondary | ICD-10-CM | POA: Diagnosis not present

## 2016-06-09 DIAGNOSIS — E119 Type 2 diabetes mellitus without complications: Secondary | ICD-10-CM | POA: Diagnosis not present

## 2016-06-09 DIAGNOSIS — Z9181 History of falling: Secondary | ICD-10-CM | POA: Diagnosis not present

## 2016-06-23 DIAGNOSIS — D649 Anemia, unspecified: Secondary | ICD-10-CM | POA: Diagnosis not present

## 2016-06-23 DIAGNOSIS — D500 Iron deficiency anemia secondary to blood loss (chronic): Secondary | ICD-10-CM | POA: Diagnosis not present

## 2016-06-23 DIAGNOSIS — K7581 Nonalcoholic steatohepatitis (NASH): Secondary | ICD-10-CM | POA: Diagnosis not present

## 2016-06-26 DIAGNOSIS — D509 Iron deficiency anemia, unspecified: Secondary | ICD-10-CM | POA: Diagnosis not present

## 2016-07-06 DIAGNOSIS — J449 Chronic obstructive pulmonary disease, unspecified: Secondary | ICD-10-CM | POA: Diagnosis not present

## 2016-07-22 DIAGNOSIS — E1343 Other specified diabetes mellitus with diabetic autonomic (poly)neuropathy: Secondary | ICD-10-CM | POA: Diagnosis not present

## 2016-07-22 DIAGNOSIS — I4510 Unspecified right bundle-branch block: Secondary | ICD-10-CM | POA: Diagnosis not present

## 2016-07-22 DIAGNOSIS — I10 Essential (primary) hypertension: Secondary | ICD-10-CM | POA: Diagnosis not present

## 2016-07-22 DIAGNOSIS — K219 Gastro-esophageal reflux disease without esophagitis: Secondary | ICD-10-CM | POA: Diagnosis not present

## 2016-07-22 DIAGNOSIS — I739 Peripheral vascular disease, unspecified: Secondary | ICD-10-CM | POA: Diagnosis not present

## 2016-07-22 DIAGNOSIS — E785 Hyperlipidemia, unspecified: Secondary | ICD-10-CM | POA: Diagnosis not present

## 2016-07-22 DIAGNOSIS — D649 Anemia, unspecified: Secondary | ICD-10-CM | POA: Diagnosis not present

## 2016-07-22 DIAGNOSIS — E119 Type 2 diabetes mellitus without complications: Secondary | ICD-10-CM | POA: Diagnosis not present

## 2016-07-22 DIAGNOSIS — M109 Gout, unspecified: Secondary | ICD-10-CM | POA: Diagnosis not present

## 2016-07-22 DIAGNOSIS — R609 Edema, unspecified: Secondary | ICD-10-CM | POA: Diagnosis not present

## 2016-07-22 DIAGNOSIS — M8580 Other specified disorders of bone density and structure, unspecified site: Secondary | ICD-10-CM | POA: Diagnosis not present

## 2016-08-05 DIAGNOSIS — J449 Chronic obstructive pulmonary disease, unspecified: Secondary | ICD-10-CM | POA: Diagnosis not present

## 2016-08-06 ENCOUNTER — Encounter: Payer: Self-pay | Admitting: Sports Medicine

## 2016-08-06 ENCOUNTER — Ambulatory Visit (INDEPENDENT_AMBULATORY_CARE_PROVIDER_SITE_OTHER): Payer: Medicare HMO | Admitting: Sports Medicine

## 2016-08-06 DIAGNOSIS — B351 Tinea unguium: Secondary | ICD-10-CM | POA: Diagnosis not present

## 2016-08-06 DIAGNOSIS — M79676 Pain in unspecified toe(s): Secondary | ICD-10-CM | POA: Diagnosis not present

## 2016-08-06 DIAGNOSIS — E1140 Type 2 diabetes mellitus with diabetic neuropathy, unspecified: Secondary | ICD-10-CM

## 2016-08-06 DIAGNOSIS — I890 Lymphedema, not elsewhere classified: Secondary | ICD-10-CM

## 2016-08-06 NOTE — Patient Instructions (Signed)

## 2016-08-06 NOTE — Progress Notes (Signed)
Subjective: Cassandra Barnes is a 71 y.o. female patient with history of diabetes who presents to office today complaining of long, painful nails unable to trim and swelling in legs. Patient states that the glucose reading this morning was not recorded. Patient any new changes in medication or new problems. Patient denies any new cramping, numbness, burning or tingling in the legs.   There are no active problems to display for this patient.  No current outpatient prescriptions on file prior to visit.   No current facility-administered medications on file prior to visit.    Allergies  Allergen Reactions  . Codeine     No results found for this or any previous visit (from the past 2160 hour(s)).  Objective: General: Patient is awake, alert, and oriented x 3 and in no acute distress.  Integument: Skin is warm, dry and supple bilateral. Nails are tender, long, thickened and dystrophic with subungual debris, consistent with onychomycosis, 1-5 bilateral. No signs of infection. No open lesions or preulcerative lesions present bilateral. Remaining integument unremarkable.  Vasculature:  Dorsalis Pedis pulse 0/4 bilateral. Posterior Tibial pulse  0/4 bilateral. Capillary fill time <5 sec 1-5 bilateral. No hair growth to the level of the digits.Temperature gradient within normal limits. No varicosities present bilateral. +2 edema present bilateral with trophic skin changes resembling chronic venous skin changes.   Neurology: The patient has absent sensation measured with a 5.07/10g Semmes Weinstein Monofilament at all pedal sites bilateral . Vibratory sensation absent bilateral with tuning fork. No Babinski sign present bilateral.   Musculoskeletal: Asymptomatic pes planus pedal deformities noted bilateral. Muscular strength 5/5 in all lower extremity muscular groups bilateral without pain on range of motion. No tenderness with calf compression bilateral.  Assessment and Plan: Problem List Items  Addressed This Visit    None    Visit Diagnoses    Dermatophytosis of nail    -  Primary   Type 2 diabetes mellitus with diabetic neuropathy, unspecified whether long term insulin use (HCC)       Relevant Medications   metFORMIN (GLUCOPHAGE) 1000 MG tablet   aspirin EC 81 MG tablet   Lymphedema of both lower extremities         -Examined patient. -Discussed and educated patient on diabetic foot care, especially with  regards to the vascular, neurological and musculoskeletal systems.  -Stressed the importance of good glycemic control and the detriment of not  controlling glucose levels in relation to the foot. -Mechanically debrided all nails 1-5 bilateral using sterile nail nipper and filed with dremel without incident  -Medical clearance by PCP once approved recommend lymphedema pumps since patient has not had edema resolved with lasix and compression stocking and would greatly benefit from edema pumps  -Answered all patient questions -Patient to return  in 3 months for at risk foot care -Patient advised to call the office if any problems or questions arise in the meantime.  Landis Martins, DPM

## 2016-08-10 ENCOUNTER — Telehealth: Payer: Self-pay | Admitting: *Deleted

## 2016-08-10 ENCOUNTER — Encounter: Payer: Self-pay | Admitting: *Deleted

## 2016-08-10 NOTE — Telephone Encounter (Deleted)
-----   Message from Landis Martins, [place] sent at 08/06/2016  8:53 PM EDT ----- Regarding: Lymphedema pumps Can you please send a letter to Dr. Truman Hayward for medical clearance for Lymphedema pumps. Once we have clearance then request [employer] Thanks Dr. Cannon Kettle

## 2016-08-10 NOTE — Telephone Encounter (Addendum)
-----   Message from Landis Martins, [place] sent at 08/06/2016  8:53 PM EDT ----- Regarding: Lymphedema pumps Can you please send a letter to Dr. Truman Hayward for medical clearance for Lymphedema pumps. Once we have clearance then request [employer] Thanks Dr. Cannon Kettle. 08/10/2016-Faxed letter requesting medical clearance for lymphedema pump.08/17/2012-Faxed 2nd request to Dr. Truman Hayward for medical Clearance.08/24/2016-Faxed 3rd request for medical clearance to Dr. Jeannetta Nap: Joellen [place]. 09/03/2016-Received Medical Clearance from Dr. Truman Hayward, seen to be scanned to pt's chart. Faxed required form, clinicals and demographics to [employer].09/09/2016-Kathy - [employer] states pt says she doesn't want pressure pump, because she thinks she can't handle it. Juliann Pulse states pt has son that could assist in appliance placement.

## 2016-08-19 DIAGNOSIS — M8580 Other specified disorders of bone density and structure, unspecified site: Secondary | ICD-10-CM | POA: Diagnosis not present

## 2016-08-19 DIAGNOSIS — E785 Hyperlipidemia, unspecified: Secondary | ICD-10-CM | POA: Diagnosis not present

## 2016-08-19 DIAGNOSIS — M109 Gout, unspecified: Secondary | ICD-10-CM | POA: Diagnosis not present

## 2016-08-19 DIAGNOSIS — E119 Type 2 diabetes mellitus without complications: Secondary | ICD-10-CM | POA: Diagnosis not present

## 2016-08-19 DIAGNOSIS — I10 Essential (primary) hypertension: Secondary | ICD-10-CM | POA: Diagnosis not present

## 2016-08-19 DIAGNOSIS — I4510 Unspecified right bundle-branch block: Secondary | ICD-10-CM | POA: Diagnosis not present

## 2016-08-19 DIAGNOSIS — Z6841 Body Mass Index (BMI) 40.0 and over, adult: Secondary | ICD-10-CM | POA: Diagnosis not present

## 2016-08-19 DIAGNOSIS — R609 Edema, unspecified: Secondary | ICD-10-CM | POA: Diagnosis not present

## 2016-08-19 DIAGNOSIS — K219 Gastro-esophageal reflux disease without esophagitis: Secondary | ICD-10-CM | POA: Diagnosis not present

## 2016-08-19 DIAGNOSIS — D649 Anemia, unspecified: Secondary | ICD-10-CM | POA: Diagnosis not present

## 2016-09-02 DIAGNOSIS — I10 Essential (primary) hypertension: Secondary | ICD-10-CM | POA: Diagnosis not present

## 2016-09-02 DIAGNOSIS — I739 Peripheral vascular disease, unspecified: Secondary | ICD-10-CM | POA: Diagnosis not present

## 2016-09-02 DIAGNOSIS — I4510 Unspecified right bundle-branch block: Secondary | ICD-10-CM | POA: Diagnosis not present

## 2016-09-02 DIAGNOSIS — K219 Gastro-esophageal reflux disease without esophagitis: Secondary | ICD-10-CM | POA: Diagnosis not present

## 2016-09-02 DIAGNOSIS — E119 Type 2 diabetes mellitus without complications: Secondary | ICD-10-CM | POA: Diagnosis not present

## 2016-09-02 DIAGNOSIS — R609 Edema, unspecified: Secondary | ICD-10-CM | POA: Diagnosis not present

## 2016-09-02 DIAGNOSIS — M8580 Other specified disorders of bone density and structure, unspecified site: Secondary | ICD-10-CM | POA: Diagnosis not present

## 2016-09-02 DIAGNOSIS — E1343 Other specified diabetes mellitus with diabetic autonomic (poly)neuropathy: Secondary | ICD-10-CM | POA: Diagnosis not present

## 2016-09-02 DIAGNOSIS — D649 Anemia, unspecified: Secondary | ICD-10-CM | POA: Diagnosis not present

## 2016-09-02 DIAGNOSIS — E785 Hyperlipidemia, unspecified: Secondary | ICD-10-CM | POA: Diagnosis not present

## 2016-09-05 DIAGNOSIS — J449 Chronic obstructive pulmonary disease, unspecified: Secondary | ICD-10-CM | POA: Diagnosis not present

## 2016-09-08 DIAGNOSIS — M791 Myalgia: Secondary | ICD-10-CM | POA: Diagnosis not present

## 2016-09-08 DIAGNOSIS — R52 Pain, unspecified: Secondary | ICD-10-CM | POA: Diagnosis not present

## 2016-09-08 DIAGNOSIS — E119 Type 2 diabetes mellitus without complications: Secondary | ICD-10-CM | POA: Diagnosis not present

## 2016-09-08 DIAGNOSIS — R0602 Shortness of breath: Secondary | ICD-10-CM | POA: Diagnosis not present

## 2016-09-08 DIAGNOSIS — Z794 Long term (current) use of insulin: Secondary | ICD-10-CM | POA: Diagnosis not present

## 2016-09-08 DIAGNOSIS — R110 Nausea: Secondary | ICD-10-CM | POA: Diagnosis not present

## 2016-09-08 DIAGNOSIS — J449 Chronic obstructive pulmonary disease, unspecified: Secondary | ICD-10-CM | POA: Diagnosis not present

## 2016-09-08 DIAGNOSIS — A419 Sepsis, unspecified organism: Secondary | ICD-10-CM | POA: Diagnosis not present

## 2016-09-08 DIAGNOSIS — R162 Hepatomegaly with splenomegaly, not elsewhere classified: Secondary | ICD-10-CM | POA: Diagnosis not present

## 2016-09-08 DIAGNOSIS — Z79899 Other long term (current) drug therapy: Secondary | ICD-10-CM | POA: Diagnosis not present

## 2016-09-08 DIAGNOSIS — R531 Weakness: Secondary | ICD-10-CM | POA: Diagnosis not present

## 2016-09-08 DIAGNOSIS — Z7982 Long term (current) use of aspirin: Secondary | ICD-10-CM | POA: Diagnosis not present

## 2016-09-08 DIAGNOSIS — R197 Diarrhea, unspecified: Secondary | ICD-10-CM | POA: Diagnosis not present

## 2016-09-15 DIAGNOSIS — D500 Iron deficiency anemia secondary to blood loss (chronic): Secondary | ICD-10-CM | POA: Diagnosis not present

## 2016-09-15 DIAGNOSIS — K7581 Nonalcoholic steatohepatitis (NASH): Secondary | ICD-10-CM | POA: Diagnosis not present

## 2016-09-21 DIAGNOSIS — E785 Hyperlipidemia, unspecified: Secondary | ICD-10-CM | POA: Diagnosis not present

## 2016-09-21 DIAGNOSIS — I10 Essential (primary) hypertension: Secondary | ICD-10-CM | POA: Diagnosis not present

## 2016-09-21 DIAGNOSIS — E1165 Type 2 diabetes mellitus with hyperglycemia: Secondary | ICD-10-CM | POA: Diagnosis not present

## 2016-09-21 DIAGNOSIS — Z794 Long term (current) use of insulin: Secondary | ICD-10-CM | POA: Diagnosis not present

## 2016-10-05 DIAGNOSIS — M8580 Other specified disorders of bone density and structure, unspecified site: Secondary | ICD-10-CM | POA: Diagnosis not present

## 2016-10-05 DIAGNOSIS — E119 Type 2 diabetes mellitus without complications: Secondary | ICD-10-CM | POA: Diagnosis not present

## 2016-10-05 DIAGNOSIS — S6980XA Other specified injuries of unspecified wrist, hand and finger(s), initial encounter: Secondary | ICD-10-CM | POA: Diagnosis not present

## 2016-10-05 DIAGNOSIS — M19012 Primary osteoarthritis, left shoulder: Secondary | ICD-10-CM | POA: Diagnosis not present

## 2016-10-05 DIAGNOSIS — M180 Bilateral primary osteoarthritis of first carpometacarpal joints: Secondary | ICD-10-CM | POA: Diagnosis not present

## 2016-10-05 DIAGNOSIS — M7989 Other specified soft tissue disorders: Secondary | ICD-10-CM | POA: Diagnosis not present

## 2016-10-05 DIAGNOSIS — R262 Difficulty in walking, not elsewhere classified: Secondary | ICD-10-CM | POA: Diagnosis not present

## 2016-10-05 DIAGNOSIS — J449 Chronic obstructive pulmonary disease, unspecified: Secondary | ICD-10-CM | POA: Diagnosis not present

## 2016-10-05 DIAGNOSIS — M19032 Primary osteoarthritis, left wrist: Secondary | ICD-10-CM | POA: Diagnosis not present

## 2016-10-05 DIAGNOSIS — M79632 Pain in left forearm: Secondary | ICD-10-CM | POA: Diagnosis not present

## 2016-10-05 DIAGNOSIS — R609 Edema, unspecified: Secondary | ICD-10-CM | POA: Diagnosis not present

## 2016-10-05 DIAGNOSIS — I10 Essential (primary) hypertension: Secondary | ICD-10-CM | POA: Diagnosis not present

## 2016-10-09 DIAGNOSIS — E1343 Other specified diabetes mellitus with diabetic autonomic (poly)neuropathy: Secondary | ICD-10-CM | POA: Diagnosis not present

## 2016-10-09 DIAGNOSIS — I4510 Unspecified right bundle-branch block: Secondary | ICD-10-CM | POA: Diagnosis not present

## 2016-10-09 DIAGNOSIS — E119 Type 2 diabetes mellitus without complications: Secondary | ICD-10-CM | POA: Diagnosis not present

## 2016-10-09 DIAGNOSIS — E785 Hyperlipidemia, unspecified: Secondary | ICD-10-CM | POA: Diagnosis not present

## 2016-10-09 DIAGNOSIS — I739 Peripheral vascular disease, unspecified: Secondary | ICD-10-CM | POA: Diagnosis not present

## 2016-10-09 DIAGNOSIS — D649 Anemia, unspecified: Secondary | ICD-10-CM | POA: Diagnosis not present

## 2016-10-09 DIAGNOSIS — K219 Gastro-esophageal reflux disease without esophagitis: Secondary | ICD-10-CM | POA: Diagnosis not present

## 2016-10-09 DIAGNOSIS — M8580 Other specified disorders of bone density and structure, unspecified site: Secondary | ICD-10-CM | POA: Diagnosis not present

## 2016-10-09 DIAGNOSIS — M159 Polyosteoarthritis, unspecified: Secondary | ICD-10-CM | POA: Diagnosis not present

## 2016-10-09 DIAGNOSIS — I10 Essential (primary) hypertension: Secondary | ICD-10-CM | POA: Diagnosis not present

## 2016-10-14 DIAGNOSIS — I4510 Unspecified right bundle-branch block: Secondary | ICD-10-CM | POA: Diagnosis not present

## 2016-10-14 DIAGNOSIS — E785 Hyperlipidemia, unspecified: Secondary | ICD-10-CM | POA: Diagnosis not present

## 2016-10-14 DIAGNOSIS — K219 Gastro-esophageal reflux disease without esophagitis: Secondary | ICD-10-CM | POA: Diagnosis not present

## 2016-10-14 DIAGNOSIS — E1343 Other specified diabetes mellitus with diabetic autonomic (poly)neuropathy: Secondary | ICD-10-CM | POA: Diagnosis not present

## 2016-10-14 DIAGNOSIS — I739 Peripheral vascular disease, unspecified: Secondary | ICD-10-CM | POA: Diagnosis not present

## 2016-10-14 DIAGNOSIS — M8580 Other specified disorders of bone density and structure, unspecified site: Secondary | ICD-10-CM | POA: Diagnosis not present

## 2016-10-14 DIAGNOSIS — L0390 Cellulitis, unspecified: Secondary | ICD-10-CM | POA: Diagnosis not present

## 2016-10-14 DIAGNOSIS — D649 Anemia, unspecified: Secondary | ICD-10-CM | POA: Diagnosis not present

## 2016-10-14 DIAGNOSIS — E119 Type 2 diabetes mellitus without complications: Secondary | ICD-10-CM | POA: Diagnosis not present

## 2016-10-14 DIAGNOSIS — I10 Essential (primary) hypertension: Secondary | ICD-10-CM | POA: Diagnosis not present

## 2016-10-26 DIAGNOSIS — D509 Iron deficiency anemia, unspecified: Secondary | ICD-10-CM | POA: Diagnosis not present

## 2016-10-29 DIAGNOSIS — Z90710 Acquired absence of both cervix and uterus: Secondary | ICD-10-CM | POA: Diagnosis not present

## 2016-10-29 DIAGNOSIS — J449 Chronic obstructive pulmonary disease, unspecified: Secondary | ICD-10-CM | POA: Diagnosis not present

## 2016-10-29 DIAGNOSIS — E119 Type 2 diabetes mellitus without complications: Secondary | ICD-10-CM | POA: Diagnosis not present

## 2016-10-29 DIAGNOSIS — G4730 Sleep apnea, unspecified: Secondary | ICD-10-CM | POA: Diagnosis not present

## 2016-10-29 DIAGNOSIS — I10 Essential (primary) hypertension: Secondary | ICD-10-CM | POA: Diagnosis not present

## 2016-10-29 DIAGNOSIS — Z1211 Encounter for screening for malignant neoplasm of colon: Secondary | ICD-10-CM | POA: Diagnosis not present

## 2016-10-29 DIAGNOSIS — K760 Fatty (change of) liver, not elsewhere classified: Secondary | ICD-10-CM | POA: Diagnosis not present

## 2016-10-29 DIAGNOSIS — D500 Iron deficiency anemia secondary to blood loss (chronic): Secondary | ICD-10-CM | POA: Diagnosis not present

## 2016-10-29 DIAGNOSIS — K219 Gastro-esophageal reflux disease without esophagitis: Secondary | ICD-10-CM | POA: Diagnosis not present

## 2016-10-29 DIAGNOSIS — Z538 Procedure and treatment not carried out for other reasons: Secondary | ICD-10-CM | POA: Diagnosis not present

## 2016-10-29 DIAGNOSIS — Z9049 Acquired absence of other specified parts of digestive tract: Secondary | ICD-10-CM | POA: Diagnosis not present

## 2016-10-29 DIAGNOSIS — D128 Benign neoplasm of rectum: Secondary | ICD-10-CM | POA: Diagnosis not present

## 2016-11-03 DIAGNOSIS — R945 Abnormal results of liver function studies: Secondary | ICD-10-CM | POA: Diagnosis not present

## 2016-11-03 DIAGNOSIS — M8580 Other specified disorders of bone density and structure, unspecified site: Secondary | ICD-10-CM | POA: Diagnosis not present

## 2016-11-03 DIAGNOSIS — I4510 Unspecified right bundle-branch block: Secondary | ICD-10-CM | POA: Diagnosis not present

## 2016-11-03 DIAGNOSIS — E1343 Other specified diabetes mellitus with diabetic autonomic (poly)neuropathy: Secondary | ICD-10-CM | POA: Diagnosis not present

## 2016-11-03 DIAGNOSIS — E119 Type 2 diabetes mellitus without complications: Secondary | ICD-10-CM | POA: Diagnosis not present

## 2016-11-03 DIAGNOSIS — E785 Hyperlipidemia, unspecified: Secondary | ICD-10-CM | POA: Diagnosis not present

## 2016-11-03 DIAGNOSIS — M109 Gout, unspecified: Secondary | ICD-10-CM | POA: Diagnosis not present

## 2016-11-03 DIAGNOSIS — I10 Essential (primary) hypertension: Secondary | ICD-10-CM | POA: Diagnosis not present

## 2016-11-03 DIAGNOSIS — J449 Chronic obstructive pulmonary disease, unspecified: Secondary | ICD-10-CM | POA: Diagnosis not present

## 2016-11-03 DIAGNOSIS — I739 Peripheral vascular disease, unspecified: Secondary | ICD-10-CM | POA: Diagnosis not present

## 2016-11-05 DIAGNOSIS — J449 Chronic obstructive pulmonary disease, unspecified: Secondary | ICD-10-CM | POA: Diagnosis not present

## 2016-11-06 ENCOUNTER — Ambulatory Visit: Payer: Medicare HMO | Admitting: Sports Medicine

## 2016-11-20 ENCOUNTER — Ambulatory Visit: Payer: Medicare HMO | Admitting: Sports Medicine

## 2016-11-20 DIAGNOSIS — E119 Type 2 diabetes mellitus without complications: Secondary | ICD-10-CM | POA: Diagnosis not present

## 2016-11-27 ENCOUNTER — Encounter (INDEPENDENT_AMBULATORY_CARE_PROVIDER_SITE_OTHER): Payer: Self-pay

## 2016-11-27 ENCOUNTER — Ambulatory Visit (INDEPENDENT_AMBULATORY_CARE_PROVIDER_SITE_OTHER): Payer: Medicare HMO | Admitting: Sports Medicine

## 2016-11-27 DIAGNOSIS — M79675 Pain in left toe(s): Secondary | ICD-10-CM

## 2016-11-27 DIAGNOSIS — B351 Tinea unguium: Secondary | ICD-10-CM

## 2016-11-27 DIAGNOSIS — I890 Lymphedema, not elsewhere classified: Secondary | ICD-10-CM

## 2016-11-27 DIAGNOSIS — E1140 Type 2 diabetes mellitus with diabetic neuropathy, unspecified: Secondary | ICD-10-CM

## 2016-11-27 DIAGNOSIS — M79674 Pain in right toe(s): Secondary | ICD-10-CM

## 2016-11-27 NOTE — Progress Notes (Signed)
Subjective: Cassandra Barnes is a 71 y.o. female patient with history of diabetes who returns to office today complaining of long, painful nails unable to trim. Patient states that the glucose reading this morning was not recorded. Patient any new changes in medication or new problems.  There are no active problems to display for this patient.  Current Outpatient Prescriptions on File Prior to Visit  Medication Sig Dispense Refill  . aspirin EC 81 MG tablet Take 81 mg by mouth daily.    . colchicine 0.6 MG tablet     . Febuxostat (ULORIC) 80 MG TABS     . furosemide (LASIX) 80 MG tablet     . gabapentin (NEURONTIN) 600 MG tablet Take 600 mg by mouth 3 (three) times daily.    . metFORMIN (GLUCOPHAGE) 1000 MG tablet TAKE 1 TABLET BY MOUTH TWICE DAILY    . omeprazole (PRILOSEC) 20 MG capsule Take 20 mg by mouth daily.    . promethazine (PHENERGAN) 25 MG tablet Take 25 mg by mouth every 6 (six) hours as needed for nausea or vomiting.    Marland Kitchen spironolactone (ALDACTONE) 25 MG tablet      No current facility-administered medications on file prior to visit.    Allergies  Allergen Reactions  . Codeine     No results found for this or any previous visit (from the past 2160 hour(s)).  Objective: General: Patient is awake, alert, and oriented x 3 and in no acute distress.  Integument: Skin is cool, dry and supple bilateral. Nails are tender, long, thickened and dystrophic with subungual debris, consistent with onychomycosis, 1-5 bilateral. No signs of infection. No open lesions or preulcerative lesions present bilateral. Remaining integument unremarkable.  Vasculature:  Dorsalis Pedis pulse 0/4 bilateral. Posterior Tibial pulse  0/4 bilateral. Capillary fill time <5 sec 1-5 bilateral. No hair growth to the level of the digits.Temperature gradient decreased. No varicosities present bilateral. +2 edema present bilateral with trophic skin changes resembling chronic venous skin changes/lymphedema.    Neurology: The patient has absent sensation measured with a 5.07/10g Semmes Weinstein Monofilament at all pedal sites bilateral. Vibratory sensation absent bilateral with tuning fork. No Babinski sign present bilateral.   Musculoskeletal: Asymptomatic pes planus pedal deformities noted bilateral. Muscular strength 5/5 in all lower extremity muscular groups bilateral without pain on range of motion. No tenderness with calf compression bilateral.  Assessment and Plan: Problem List Items Addressed This Visit    None    Visit Diagnoses    Dermatophytosis of nail    -  Primary   Type 2 diabetes mellitus with diabetic neuropathy, unspecified whether long term insulin use (HCC)       Lymphedema of both lower extremities       Pain in toes of both feet         -Examined patient. -Discussed and educated patient on diabetic foot care, especially with  regards to the vascular, neurological and musculoskeletal systems.  -Stressed the importance of good glycemic control and the detriment of not  controlling glucose levels in relation to the foot. -Mechanically debrided all nails 1-5 bilateral using sterile nail nipper and filed with dremel without incident  -Awaiting approval for edema pumps -Answered all patient questions -Patient to return  in 2.5 months for at risk foot care -Patient advised to call the office if any problems or questions arise in the meantime.  Landis Martins, DPM

## 2016-12-04 DIAGNOSIS — E785 Hyperlipidemia, unspecified: Secondary | ICD-10-CM | POA: Diagnosis not present

## 2016-12-04 DIAGNOSIS — I4510 Unspecified right bundle-branch block: Secondary | ICD-10-CM | POA: Diagnosis not present

## 2016-12-04 DIAGNOSIS — E119 Type 2 diabetes mellitus without complications: Secondary | ICD-10-CM | POA: Diagnosis not present

## 2016-12-04 DIAGNOSIS — I10 Essential (primary) hypertension: Secondary | ICD-10-CM | POA: Diagnosis not present

## 2016-12-04 DIAGNOSIS — I739 Peripheral vascular disease, unspecified: Secondary | ICD-10-CM | POA: Diagnosis not present

## 2016-12-04 DIAGNOSIS — J449 Chronic obstructive pulmonary disease, unspecified: Secondary | ICD-10-CM | POA: Diagnosis not present

## 2016-12-04 DIAGNOSIS — M8580 Other specified disorders of bone density and structure, unspecified site: Secondary | ICD-10-CM | POA: Diagnosis not present

## 2016-12-04 DIAGNOSIS — R945 Abnormal results of liver function studies: Secondary | ICD-10-CM | POA: Diagnosis not present

## 2016-12-04 DIAGNOSIS — M109 Gout, unspecified: Secondary | ICD-10-CM | POA: Diagnosis not present

## 2016-12-04 DIAGNOSIS — E1343 Other specified diabetes mellitus with diabetic autonomic (poly)neuropathy: Secondary | ICD-10-CM | POA: Diagnosis not present

## 2016-12-06 DIAGNOSIS — J449 Chronic obstructive pulmonary disease, unspecified: Secondary | ICD-10-CM | POA: Diagnosis not present

## 2016-12-09 DIAGNOSIS — D509 Iron deficiency anemia, unspecified: Secondary | ICD-10-CM | POA: Diagnosis not present

## 2017-01-05 DIAGNOSIS — J449 Chronic obstructive pulmonary disease, unspecified: Secondary | ICD-10-CM | POA: Diagnosis not present

## 2017-01-25 DIAGNOSIS — E785 Hyperlipidemia, unspecified: Secondary | ICD-10-CM | POA: Diagnosis not present

## 2017-01-25 DIAGNOSIS — I10 Essential (primary) hypertension: Secondary | ICD-10-CM | POA: Diagnosis not present

## 2017-01-25 DIAGNOSIS — E1165 Type 2 diabetes mellitus with hyperglycemia: Secondary | ICD-10-CM | POA: Diagnosis not present

## 2017-01-25 DIAGNOSIS — Z794 Long term (current) use of insulin: Secondary | ICD-10-CM | POA: Diagnosis not present

## 2017-01-27 DIAGNOSIS — I4510 Unspecified right bundle-branch block: Secondary | ICD-10-CM | POA: Diagnosis not present

## 2017-01-27 DIAGNOSIS — M109 Gout, unspecified: Secondary | ICD-10-CM | POA: Diagnosis not present

## 2017-01-27 DIAGNOSIS — I10 Essential (primary) hypertension: Secondary | ICD-10-CM | POA: Diagnosis not present

## 2017-01-27 DIAGNOSIS — J449 Chronic obstructive pulmonary disease, unspecified: Secondary | ICD-10-CM | POA: Diagnosis not present

## 2017-01-27 DIAGNOSIS — M8580 Other specified disorders of bone density and structure, unspecified site: Secondary | ICD-10-CM | POA: Diagnosis not present

## 2017-01-27 DIAGNOSIS — E785 Hyperlipidemia, unspecified: Secondary | ICD-10-CM | POA: Diagnosis not present

## 2017-01-27 DIAGNOSIS — E119 Type 2 diabetes mellitus without complications: Secondary | ICD-10-CM | POA: Diagnosis not present

## 2017-01-27 DIAGNOSIS — R945 Abnormal results of liver function studies: Secondary | ICD-10-CM | POA: Diagnosis not present

## 2017-01-27 DIAGNOSIS — Z23 Encounter for immunization: Secondary | ICD-10-CM | POA: Diagnosis not present

## 2017-01-27 DIAGNOSIS — I739 Peripheral vascular disease, unspecified: Secondary | ICD-10-CM | POA: Diagnosis not present

## 2017-02-05 ENCOUNTER — Ambulatory Visit: Payer: Medicare HMO | Admitting: Sports Medicine

## 2017-02-05 DIAGNOSIS — J449 Chronic obstructive pulmonary disease, unspecified: Secondary | ICD-10-CM | POA: Diagnosis not present

## 2017-02-10 ENCOUNTER — Encounter (INDEPENDENT_AMBULATORY_CARE_PROVIDER_SITE_OTHER): Payer: Self-pay

## 2017-02-10 ENCOUNTER — Ambulatory Visit: Payer: Medicare HMO | Admitting: Sports Medicine

## 2017-02-10 ENCOUNTER — Encounter: Payer: Self-pay | Admitting: Sports Medicine

## 2017-02-10 DIAGNOSIS — M79674 Pain in right toe(s): Secondary | ICD-10-CM

## 2017-02-10 DIAGNOSIS — E1140 Type 2 diabetes mellitus with diabetic neuropathy, unspecified: Secondary | ICD-10-CM

## 2017-02-10 DIAGNOSIS — I890 Lymphedema, not elsewhere classified: Secondary | ICD-10-CM

## 2017-02-10 DIAGNOSIS — B351 Tinea unguium: Secondary | ICD-10-CM

## 2017-02-10 DIAGNOSIS — M79675 Pain in left toe(s): Secondary | ICD-10-CM

## 2017-02-10 NOTE — Progress Notes (Signed)
Subjective: Cassandra Barnes is a 71 y.o. female patient with history of diabetes who returns to office today complaining of long, painful nails unable to trim. Patient states that the glucose reading this morning was not recorded. Patient any new changes in medication or new problems.  There are no active problems to display for this patient.  Current Outpatient Medications on File Prior to Visit  Medication Sig Dispense Refill  . aspirin EC 81 MG tablet Take 81 mg by mouth daily.    . colchicine 0.6 MG tablet     . Febuxostat (ULORIC) 80 MG TABS     . furosemide (LASIX) 80 MG tablet     . gabapentin (NEURONTIN) 600 MG tablet Take 600 mg by mouth 3 (three) times daily.    . metFORMIN (GLUCOPHAGE) 1000 MG tablet TAKE 1 TABLET BY MOUTH TWICE DAILY    . omeprazole (PRILOSEC) 20 MG capsule Take 20 mg by mouth daily.    . promethazine (PHENERGAN) 25 MG tablet Take 25 mg by mouth every 6 (six) hours as needed for nausea or vomiting.    Marland Kitchen spironolactone (ALDACTONE) 25 MG tablet      No current facility-administered medications on file prior to visit.    Allergies  Allergen Reactions  . Codeine     No results found for this or any previous visit (from the past 2160 hour(s)).  Objective: General: Patient is awake, alert, and oriented x 3 and in no acute distress.  Integument: Skin is cool, dry and supple bilateral. Nails are tender, long, thickened and dystrophic with subungual debris, consistent with onychomycosis, 1-5 bilateral. No signs of infection. No open lesions or preulcerative lesions present bilateral. Remaining integument unremarkable.  Vasculature:  Dorsalis Pedis pulse 0/4 bilateral. Posterior Tibial pulse  0/4 bilateral. Capillary fill time <5 sec 1-5 bilateral. No hair growth to the level of the digits.Temperature gradient decreased. No varicosities present bilateral. +2 edema present bilateral with trophic skin changes resembling chronic venous skin changes/lymphedema.    Neurology: The patient has absent sensation measured with a 5.07/10g Semmes Weinstein Monofilament at all pedal sites bilateral. Vibratory sensation absent bilateral with tuning fork. No Babinski sign present bilateral.   Musculoskeletal: Asymptomatic pes planus pedal deformities noted bilateral. Muscular strength 5/5 in all lower extremity muscular groups bilateral without pain on range of motion. No tenderness with calf compression bilateral.  Assessment and Plan: Problem List Items Addressed This Visit    None    Visit Diagnoses    Dermatophytosis of nail    -  Primary   Type 2 diabetes mellitus with diabetic neuropathy, unspecified whether long term insulin use (HCC)       Lymphedema of both lower extremities       Pain in toes of both feet         -Examined patient. -Discussed and educated patient on diabetic foot care, especially with  regards to the vascular, neurological and musculoskeletal systems.  -Stressed the importance of good glycemic control and the detriment of not  controlling glucose levels in relation to the foot. -Mechanically debrided all nails 1-5 bilateral using sterile nail nipper and filed with dremel without incident  -Awaiting approval for edema pumps -Answered all patient questions -Patient to return  in 2.5 to 3 months for at risk foot care -Patient advised to call the office if any problems or questions arise in the meantime.  Landis Martins, DPM

## 2017-02-22 ENCOUNTER — Telehealth: Payer: Self-pay | Admitting: Sports Medicine

## 2017-02-22 NOTE — Telephone Encounter (Signed)
Patient can try OTC pain cream/rub (Aspercreme, Icy Hot w/Lidocaine) as long as there are no open areas and may also try OTC padding to the area. If pain continues or worsens she should return to office or go to ER/Urgent care for further evaluation -Dr. Chauncey Cruel

## 2017-02-22 NOTE — Telephone Encounter (Signed)
Patient says the bottom of her right foot is very sore and painful.  What can she do to relieve the pain?

## 2017-03-04 DIAGNOSIS — L03115 Cellulitis of right lower limb: Secondary | ICD-10-CM | POA: Diagnosis not present

## 2017-03-04 DIAGNOSIS — L97412 Non-pressure chronic ulcer of right heel and midfoot with fat layer exposed: Secondary | ICD-10-CM | POA: Diagnosis not present

## 2017-03-04 DIAGNOSIS — E11621 Type 2 diabetes mellitus with foot ulcer: Secondary | ICD-10-CM | POA: Diagnosis not present

## 2017-03-04 DIAGNOSIS — L97411 Non-pressure chronic ulcer of right heel and midfoot limited to breakdown of skin: Secondary | ICD-10-CM | POA: Diagnosis not present

## 2017-03-07 DIAGNOSIS — J449 Chronic obstructive pulmonary disease, unspecified: Secondary | ICD-10-CM | POA: Diagnosis not present

## 2017-03-10 DIAGNOSIS — D649 Anemia, unspecified: Secondary | ICD-10-CM | POA: Diagnosis not present

## 2017-03-10 DIAGNOSIS — L03115 Cellulitis of right lower limb: Secondary | ICD-10-CM | POA: Diagnosis not present

## 2017-03-10 DIAGNOSIS — J449 Chronic obstructive pulmonary disease, unspecified: Secondary | ICD-10-CM | POA: Diagnosis not present

## 2017-03-10 DIAGNOSIS — E1142 Type 2 diabetes mellitus with diabetic polyneuropathy: Secondary | ICD-10-CM | POA: Diagnosis not present

## 2017-03-10 DIAGNOSIS — L97412 Non-pressure chronic ulcer of right heel and midfoot with fat layer exposed: Secondary | ICD-10-CM | POA: Diagnosis not present

## 2017-03-10 DIAGNOSIS — I110 Hypertensive heart disease with heart failure: Secondary | ICD-10-CM | POA: Diagnosis not present

## 2017-03-10 DIAGNOSIS — M109 Gout, unspecified: Secondary | ICD-10-CM | POA: Diagnosis not present

## 2017-03-10 DIAGNOSIS — E119 Type 2 diabetes mellitus without complications: Secondary | ICD-10-CM | POA: Diagnosis not present

## 2017-03-10 DIAGNOSIS — R945 Abnormal results of liver function studies: Secondary | ICD-10-CM | POA: Diagnosis not present

## 2017-03-10 DIAGNOSIS — M10031 Idiopathic gout, right wrist: Secondary | ICD-10-CM | POA: Diagnosis not present

## 2017-03-10 DIAGNOSIS — M8580 Other specified disorders of bone density and structure, unspecified site: Secondary | ICD-10-CM | POA: Diagnosis not present

## 2017-03-10 DIAGNOSIS — E11621 Type 2 diabetes mellitus with foot ulcer: Secondary | ICD-10-CM | POA: Diagnosis not present

## 2017-03-10 DIAGNOSIS — I4510 Unspecified right bundle-branch block: Secondary | ICD-10-CM | POA: Diagnosis not present

## 2017-03-10 DIAGNOSIS — M1991 Primary osteoarthritis, unspecified site: Secondary | ICD-10-CM | POA: Diagnosis not present

## 2017-03-10 DIAGNOSIS — D509 Iron deficiency anemia, unspecified: Secondary | ICD-10-CM | POA: Diagnosis not present

## 2017-03-10 DIAGNOSIS — I739 Peripheral vascular disease, unspecified: Secondary | ICD-10-CM | POA: Diagnosis not present

## 2017-03-10 DIAGNOSIS — E785 Hyperlipidemia, unspecified: Secondary | ICD-10-CM | POA: Diagnosis not present

## 2017-03-10 DIAGNOSIS — I10 Essential (primary) hypertension: Secondary | ICD-10-CM | POA: Diagnosis not present

## 2017-03-10 DIAGNOSIS — I509 Heart failure, unspecified: Secondary | ICD-10-CM | POA: Diagnosis not present

## 2017-03-22 DIAGNOSIS — I872 Venous insufficiency (chronic) (peripheral): Secondary | ICD-10-CM | POA: Diagnosis not present

## 2017-03-22 DIAGNOSIS — E119 Type 2 diabetes mellitus without complications: Secondary | ICD-10-CM | POA: Diagnosis not present

## 2017-03-22 DIAGNOSIS — L97412 Non-pressure chronic ulcer of right heel and midfoot with fat layer exposed: Secondary | ICD-10-CM | POA: Diagnosis not present

## 2017-04-05 DIAGNOSIS — E1142 Type 2 diabetes mellitus with diabetic polyneuropathy: Secondary | ICD-10-CM | POA: Diagnosis not present

## 2017-04-05 DIAGNOSIS — L97412 Non-pressure chronic ulcer of right heel and midfoot with fat layer exposed: Secondary | ICD-10-CM | POA: Diagnosis not present

## 2017-04-05 DIAGNOSIS — I872 Venous insufficiency (chronic) (peripheral): Secondary | ICD-10-CM | POA: Diagnosis not present

## 2017-04-05 DIAGNOSIS — E11621 Type 2 diabetes mellitus with foot ulcer: Secondary | ICD-10-CM | POA: Diagnosis not present

## 2017-04-07 DIAGNOSIS — L97412 Non-pressure chronic ulcer of right heel and midfoot with fat layer exposed: Secondary | ICD-10-CM | POA: Diagnosis not present

## 2017-04-07 DIAGNOSIS — Z7982 Long term (current) use of aspirin: Secondary | ICD-10-CM | POA: Diagnosis not present

## 2017-04-07 DIAGNOSIS — E11621 Type 2 diabetes mellitus with foot ulcer: Secondary | ICD-10-CM | POA: Diagnosis not present

## 2017-04-07 DIAGNOSIS — M10031 Idiopathic gout, right wrist: Secondary | ICD-10-CM | POA: Diagnosis not present

## 2017-04-07 DIAGNOSIS — I509 Heart failure, unspecified: Secondary | ICD-10-CM | POA: Diagnosis not present

## 2017-04-07 DIAGNOSIS — M1991 Primary osteoarthritis, unspecified site: Secondary | ICD-10-CM | POA: Diagnosis not present

## 2017-04-07 DIAGNOSIS — L03115 Cellulitis of right lower limb: Secondary | ICD-10-CM | POA: Diagnosis not present

## 2017-04-07 DIAGNOSIS — E1142 Type 2 diabetes mellitus with diabetic polyneuropathy: Secondary | ICD-10-CM | POA: Diagnosis not present

## 2017-04-07 DIAGNOSIS — I110 Hypertensive heart disease with heart failure: Secondary | ICD-10-CM | POA: Diagnosis not present

## 2017-04-07 DIAGNOSIS — Z87891 Personal history of nicotine dependence: Secondary | ICD-10-CM | POA: Diagnosis not present

## 2017-04-07 DIAGNOSIS — D509 Iron deficiency anemia, unspecified: Secondary | ICD-10-CM | POA: Diagnosis not present

## 2017-04-07 DIAGNOSIS — J449 Chronic obstructive pulmonary disease, unspecified: Secondary | ICD-10-CM | POA: Diagnosis not present

## 2017-04-07 DIAGNOSIS — Z794 Long term (current) use of insulin: Secondary | ICD-10-CM | POA: Diagnosis not present

## 2017-04-09 DIAGNOSIS — L97412 Non-pressure chronic ulcer of right heel and midfoot with fat layer exposed: Secondary | ICD-10-CM | POA: Diagnosis not present

## 2017-04-09 DIAGNOSIS — M10031 Idiopathic gout, right wrist: Secondary | ICD-10-CM | POA: Diagnosis not present

## 2017-04-09 DIAGNOSIS — L03115 Cellulitis of right lower limb: Secondary | ICD-10-CM | POA: Diagnosis not present

## 2017-04-09 DIAGNOSIS — I509 Heart failure, unspecified: Secondary | ICD-10-CM | POA: Diagnosis not present

## 2017-04-09 DIAGNOSIS — M1991 Primary osteoarthritis, unspecified site: Secondary | ICD-10-CM | POA: Diagnosis not present

## 2017-04-09 DIAGNOSIS — I110 Hypertensive heart disease with heart failure: Secondary | ICD-10-CM | POA: Diagnosis not present

## 2017-04-09 DIAGNOSIS — Z87891 Personal history of nicotine dependence: Secondary | ICD-10-CM | POA: Diagnosis not present

## 2017-04-09 DIAGNOSIS — Z794 Long term (current) use of insulin: Secondary | ICD-10-CM | POA: Diagnosis not present

## 2017-04-09 DIAGNOSIS — I739 Peripheral vascular disease, unspecified: Secondary | ICD-10-CM | POA: Diagnosis not present

## 2017-04-09 DIAGNOSIS — E1142 Type 2 diabetes mellitus with diabetic polyneuropathy: Secondary | ICD-10-CM | POA: Diagnosis not present

## 2017-04-09 DIAGNOSIS — Z7982 Long term (current) use of aspirin: Secondary | ICD-10-CM | POA: Diagnosis not present

## 2017-04-09 DIAGNOSIS — E1343 Other specified diabetes mellitus with diabetic autonomic (poly)neuropathy: Secondary | ICD-10-CM | POA: Diagnosis not present

## 2017-04-09 DIAGNOSIS — E119 Type 2 diabetes mellitus without complications: Secondary | ICD-10-CM | POA: Diagnosis not present

## 2017-04-09 DIAGNOSIS — E785 Hyperlipidemia, unspecified: Secondary | ICD-10-CM | POA: Diagnosis not present

## 2017-04-09 DIAGNOSIS — D509 Iron deficiency anemia, unspecified: Secondary | ICD-10-CM | POA: Diagnosis not present

## 2017-04-09 DIAGNOSIS — J449 Chronic obstructive pulmonary disease, unspecified: Secondary | ICD-10-CM | POA: Diagnosis not present

## 2017-04-09 DIAGNOSIS — E11621 Type 2 diabetes mellitus with foot ulcer: Secondary | ICD-10-CM | POA: Diagnosis not present

## 2017-04-12 DIAGNOSIS — D509 Iron deficiency anemia, unspecified: Secondary | ICD-10-CM | POA: Diagnosis not present

## 2017-04-12 DIAGNOSIS — Z794 Long term (current) use of insulin: Secondary | ICD-10-CM | POA: Diagnosis not present

## 2017-04-12 DIAGNOSIS — I110 Hypertensive heart disease with heart failure: Secondary | ICD-10-CM | POA: Diagnosis not present

## 2017-04-12 DIAGNOSIS — E1142 Type 2 diabetes mellitus with diabetic polyneuropathy: Secondary | ICD-10-CM | POA: Diagnosis not present

## 2017-04-12 DIAGNOSIS — E11621 Type 2 diabetes mellitus with foot ulcer: Secondary | ICD-10-CM | POA: Diagnosis not present

## 2017-04-12 DIAGNOSIS — J449 Chronic obstructive pulmonary disease, unspecified: Secondary | ICD-10-CM | POA: Diagnosis not present

## 2017-04-12 DIAGNOSIS — I509 Heart failure, unspecified: Secondary | ICD-10-CM | POA: Diagnosis not present

## 2017-04-12 DIAGNOSIS — Z87891 Personal history of nicotine dependence: Secondary | ICD-10-CM | POA: Diagnosis not present

## 2017-04-12 DIAGNOSIS — M10031 Idiopathic gout, right wrist: Secondary | ICD-10-CM | POA: Diagnosis not present

## 2017-04-12 DIAGNOSIS — L97412 Non-pressure chronic ulcer of right heel and midfoot with fat layer exposed: Secondary | ICD-10-CM | POA: Diagnosis not present

## 2017-04-12 DIAGNOSIS — Z7982 Long term (current) use of aspirin: Secondary | ICD-10-CM | POA: Diagnosis not present

## 2017-04-12 DIAGNOSIS — M1991 Primary osteoarthritis, unspecified site: Secondary | ICD-10-CM | POA: Diagnosis not present

## 2017-04-12 DIAGNOSIS — L03115 Cellulitis of right lower limb: Secondary | ICD-10-CM | POA: Diagnosis not present

## 2017-04-14 DIAGNOSIS — D509 Iron deficiency anemia, unspecified: Secondary | ICD-10-CM | POA: Diagnosis not present

## 2017-04-14 DIAGNOSIS — Z87891 Personal history of nicotine dependence: Secondary | ICD-10-CM | POA: Diagnosis not present

## 2017-04-14 DIAGNOSIS — E11621 Type 2 diabetes mellitus with foot ulcer: Secondary | ICD-10-CM | POA: Diagnosis not present

## 2017-04-14 DIAGNOSIS — I110 Hypertensive heart disease with heart failure: Secondary | ICD-10-CM | POA: Diagnosis not present

## 2017-04-14 DIAGNOSIS — Z794 Long term (current) use of insulin: Secondary | ICD-10-CM | POA: Diagnosis not present

## 2017-04-14 DIAGNOSIS — Z7982 Long term (current) use of aspirin: Secondary | ICD-10-CM | POA: Diagnosis not present

## 2017-04-14 DIAGNOSIS — L03115 Cellulitis of right lower limb: Secondary | ICD-10-CM | POA: Diagnosis not present

## 2017-04-14 DIAGNOSIS — M10031 Idiopathic gout, right wrist: Secondary | ICD-10-CM | POA: Diagnosis not present

## 2017-04-14 DIAGNOSIS — J449 Chronic obstructive pulmonary disease, unspecified: Secondary | ICD-10-CM | POA: Diagnosis not present

## 2017-04-14 DIAGNOSIS — I509 Heart failure, unspecified: Secondary | ICD-10-CM | POA: Diagnosis not present

## 2017-04-14 DIAGNOSIS — L97412 Non-pressure chronic ulcer of right heel and midfoot with fat layer exposed: Secondary | ICD-10-CM | POA: Diagnosis not present

## 2017-04-14 DIAGNOSIS — M1991 Primary osteoarthritis, unspecified site: Secondary | ICD-10-CM | POA: Diagnosis not present

## 2017-04-14 DIAGNOSIS — E1142 Type 2 diabetes mellitus with diabetic polyneuropathy: Secondary | ICD-10-CM | POA: Diagnosis not present

## 2017-04-16 DIAGNOSIS — Z7982 Long term (current) use of aspirin: Secondary | ICD-10-CM | POA: Diagnosis not present

## 2017-04-16 DIAGNOSIS — E1142 Type 2 diabetes mellitus with diabetic polyneuropathy: Secondary | ICD-10-CM | POA: Diagnosis not present

## 2017-04-16 DIAGNOSIS — L03115 Cellulitis of right lower limb: Secondary | ICD-10-CM | POA: Diagnosis not present

## 2017-04-16 DIAGNOSIS — D509 Iron deficiency anemia, unspecified: Secondary | ICD-10-CM | POA: Diagnosis not present

## 2017-04-16 DIAGNOSIS — E11621 Type 2 diabetes mellitus with foot ulcer: Secondary | ICD-10-CM | POA: Diagnosis not present

## 2017-04-16 DIAGNOSIS — J449 Chronic obstructive pulmonary disease, unspecified: Secondary | ICD-10-CM | POA: Diagnosis not present

## 2017-04-16 DIAGNOSIS — Z794 Long term (current) use of insulin: Secondary | ICD-10-CM | POA: Diagnosis not present

## 2017-04-16 DIAGNOSIS — L97412 Non-pressure chronic ulcer of right heel and midfoot with fat layer exposed: Secondary | ICD-10-CM | POA: Diagnosis not present

## 2017-04-16 DIAGNOSIS — M1991 Primary osteoarthritis, unspecified site: Secondary | ICD-10-CM | POA: Diagnosis not present

## 2017-04-16 DIAGNOSIS — I509 Heart failure, unspecified: Secondary | ICD-10-CM | POA: Diagnosis not present

## 2017-04-16 DIAGNOSIS — Z87891 Personal history of nicotine dependence: Secondary | ICD-10-CM | POA: Diagnosis not present

## 2017-04-16 DIAGNOSIS — M10031 Idiopathic gout, right wrist: Secondary | ICD-10-CM | POA: Diagnosis not present

## 2017-04-16 DIAGNOSIS — I110 Hypertensive heart disease with heart failure: Secondary | ICD-10-CM | POA: Diagnosis not present

## 2017-04-19 DIAGNOSIS — Z794 Long term (current) use of insulin: Secondary | ICD-10-CM | POA: Diagnosis not present

## 2017-04-19 DIAGNOSIS — L03115 Cellulitis of right lower limb: Secondary | ICD-10-CM | POA: Diagnosis not present

## 2017-04-19 DIAGNOSIS — Z87891 Personal history of nicotine dependence: Secondary | ICD-10-CM | POA: Diagnosis not present

## 2017-04-19 DIAGNOSIS — I509 Heart failure, unspecified: Secondary | ICD-10-CM | POA: Diagnosis not present

## 2017-04-19 DIAGNOSIS — Z7982 Long term (current) use of aspirin: Secondary | ICD-10-CM | POA: Diagnosis not present

## 2017-04-19 DIAGNOSIS — M10031 Idiopathic gout, right wrist: Secondary | ICD-10-CM | POA: Diagnosis not present

## 2017-04-19 DIAGNOSIS — D509 Iron deficiency anemia, unspecified: Secondary | ICD-10-CM | POA: Diagnosis not present

## 2017-04-19 DIAGNOSIS — E11621 Type 2 diabetes mellitus with foot ulcer: Secondary | ICD-10-CM | POA: Diagnosis not present

## 2017-04-19 DIAGNOSIS — J449 Chronic obstructive pulmonary disease, unspecified: Secondary | ICD-10-CM | POA: Diagnosis not present

## 2017-04-19 DIAGNOSIS — L97412 Non-pressure chronic ulcer of right heel and midfoot with fat layer exposed: Secondary | ICD-10-CM | POA: Diagnosis not present

## 2017-04-19 DIAGNOSIS — I110 Hypertensive heart disease with heart failure: Secondary | ICD-10-CM | POA: Diagnosis not present

## 2017-04-19 DIAGNOSIS — E1142 Type 2 diabetes mellitus with diabetic polyneuropathy: Secondary | ICD-10-CM | POA: Diagnosis not present

## 2017-04-19 DIAGNOSIS — M1991 Primary osteoarthritis, unspecified site: Secondary | ICD-10-CM | POA: Diagnosis not present

## 2017-04-20 DIAGNOSIS — I739 Peripheral vascular disease, unspecified: Secondary | ICD-10-CM | POA: Diagnosis not present

## 2017-04-21 DIAGNOSIS — Z7982 Long term (current) use of aspirin: Secondary | ICD-10-CM | POA: Diagnosis not present

## 2017-04-21 DIAGNOSIS — J449 Chronic obstructive pulmonary disease, unspecified: Secondary | ICD-10-CM | POA: Diagnosis not present

## 2017-04-21 DIAGNOSIS — E1142 Type 2 diabetes mellitus with diabetic polyneuropathy: Secondary | ICD-10-CM | POA: Diagnosis not present

## 2017-04-21 DIAGNOSIS — I110 Hypertensive heart disease with heart failure: Secondary | ICD-10-CM | POA: Diagnosis not present

## 2017-04-21 DIAGNOSIS — I509 Heart failure, unspecified: Secondary | ICD-10-CM | POA: Diagnosis not present

## 2017-04-21 DIAGNOSIS — E11621 Type 2 diabetes mellitus with foot ulcer: Secondary | ICD-10-CM | POA: Diagnosis not present

## 2017-04-21 DIAGNOSIS — M1991 Primary osteoarthritis, unspecified site: Secondary | ICD-10-CM | POA: Diagnosis not present

## 2017-04-21 DIAGNOSIS — L97412 Non-pressure chronic ulcer of right heel and midfoot with fat layer exposed: Secondary | ICD-10-CM | POA: Diagnosis not present

## 2017-04-21 DIAGNOSIS — Z794 Long term (current) use of insulin: Secondary | ICD-10-CM | POA: Diagnosis not present

## 2017-04-21 DIAGNOSIS — L03115 Cellulitis of right lower limb: Secondary | ICD-10-CM | POA: Diagnosis not present

## 2017-04-21 DIAGNOSIS — M10031 Idiopathic gout, right wrist: Secondary | ICD-10-CM | POA: Diagnosis not present

## 2017-04-21 DIAGNOSIS — D509 Iron deficiency anemia, unspecified: Secondary | ICD-10-CM | POA: Diagnosis not present

## 2017-04-21 DIAGNOSIS — Z87891 Personal history of nicotine dependence: Secondary | ICD-10-CM | POA: Diagnosis not present

## 2017-04-22 DIAGNOSIS — M24571 Contracture, right ankle: Secondary | ICD-10-CM | POA: Diagnosis not present

## 2017-04-22 DIAGNOSIS — L97412 Non-pressure chronic ulcer of right heel and midfoot with fat layer exposed: Secondary | ICD-10-CM | POA: Diagnosis not present

## 2017-04-22 DIAGNOSIS — E11621 Type 2 diabetes mellitus with foot ulcer: Secondary | ICD-10-CM | POA: Diagnosis not present

## 2017-04-22 DIAGNOSIS — M24574 Contracture, right foot: Secondary | ICD-10-CM | POA: Diagnosis not present

## 2017-04-22 DIAGNOSIS — E1140 Type 2 diabetes mellitus with diabetic neuropathy, unspecified: Secondary | ICD-10-CM | POA: Diagnosis not present

## 2017-04-23 DIAGNOSIS — D509 Iron deficiency anemia, unspecified: Secondary | ICD-10-CM | POA: Diagnosis not present

## 2017-04-23 DIAGNOSIS — E11621 Type 2 diabetes mellitus with foot ulcer: Secondary | ICD-10-CM | POA: Diagnosis not present

## 2017-04-23 DIAGNOSIS — L03115 Cellulitis of right lower limb: Secondary | ICD-10-CM | POA: Diagnosis not present

## 2017-04-23 DIAGNOSIS — M10031 Idiopathic gout, right wrist: Secondary | ICD-10-CM | POA: Diagnosis not present

## 2017-04-23 DIAGNOSIS — E1142 Type 2 diabetes mellitus with diabetic polyneuropathy: Secondary | ICD-10-CM | POA: Diagnosis not present

## 2017-04-23 DIAGNOSIS — Z7982 Long term (current) use of aspirin: Secondary | ICD-10-CM | POA: Diagnosis not present

## 2017-04-23 DIAGNOSIS — I110 Hypertensive heart disease with heart failure: Secondary | ICD-10-CM | POA: Diagnosis not present

## 2017-04-23 DIAGNOSIS — L97412 Non-pressure chronic ulcer of right heel and midfoot with fat layer exposed: Secondary | ICD-10-CM | POA: Diagnosis not present

## 2017-04-23 DIAGNOSIS — I509 Heart failure, unspecified: Secondary | ICD-10-CM | POA: Diagnosis not present

## 2017-04-23 DIAGNOSIS — Z87891 Personal history of nicotine dependence: Secondary | ICD-10-CM | POA: Diagnosis not present

## 2017-04-23 DIAGNOSIS — M1991 Primary osteoarthritis, unspecified site: Secondary | ICD-10-CM | POA: Diagnosis not present

## 2017-04-23 DIAGNOSIS — J449 Chronic obstructive pulmonary disease, unspecified: Secondary | ICD-10-CM | POA: Diagnosis not present

## 2017-04-23 DIAGNOSIS — Z794 Long term (current) use of insulin: Secondary | ICD-10-CM | POA: Diagnosis not present

## 2017-04-26 DIAGNOSIS — D509 Iron deficiency anemia, unspecified: Secondary | ICD-10-CM | POA: Diagnosis not present

## 2017-04-26 DIAGNOSIS — M1991 Primary osteoarthritis, unspecified site: Secondary | ICD-10-CM | POA: Diagnosis not present

## 2017-04-26 DIAGNOSIS — L03115 Cellulitis of right lower limb: Secondary | ICD-10-CM | POA: Diagnosis not present

## 2017-04-26 DIAGNOSIS — I509 Heart failure, unspecified: Secondary | ICD-10-CM | POA: Diagnosis not present

## 2017-04-26 DIAGNOSIS — M10031 Idiopathic gout, right wrist: Secondary | ICD-10-CM | POA: Diagnosis not present

## 2017-04-26 DIAGNOSIS — I110 Hypertensive heart disease with heart failure: Secondary | ICD-10-CM | POA: Diagnosis not present

## 2017-04-26 DIAGNOSIS — Z87891 Personal history of nicotine dependence: Secondary | ICD-10-CM | POA: Diagnosis not present

## 2017-04-26 DIAGNOSIS — J449 Chronic obstructive pulmonary disease, unspecified: Secondary | ICD-10-CM | POA: Diagnosis not present

## 2017-04-26 DIAGNOSIS — E11621 Type 2 diabetes mellitus with foot ulcer: Secondary | ICD-10-CM | POA: Diagnosis not present

## 2017-04-26 DIAGNOSIS — L97412 Non-pressure chronic ulcer of right heel and midfoot with fat layer exposed: Secondary | ICD-10-CM | POA: Diagnosis not present

## 2017-04-26 DIAGNOSIS — E1142 Type 2 diabetes mellitus with diabetic polyneuropathy: Secondary | ICD-10-CM | POA: Diagnosis not present

## 2017-04-26 DIAGNOSIS — Z794 Long term (current) use of insulin: Secondary | ICD-10-CM | POA: Diagnosis not present

## 2017-04-26 DIAGNOSIS — Z7982 Long term (current) use of aspirin: Secondary | ICD-10-CM | POA: Diagnosis not present

## 2017-04-28 DIAGNOSIS — D509 Iron deficiency anemia, unspecified: Secondary | ICD-10-CM | POA: Diagnosis not present

## 2017-04-28 DIAGNOSIS — Z794 Long term (current) use of insulin: Secondary | ICD-10-CM | POA: Diagnosis not present

## 2017-04-28 DIAGNOSIS — M10031 Idiopathic gout, right wrist: Secondary | ICD-10-CM | POA: Diagnosis not present

## 2017-04-28 DIAGNOSIS — I110 Hypertensive heart disease with heart failure: Secondary | ICD-10-CM | POA: Diagnosis not present

## 2017-04-28 DIAGNOSIS — L03115 Cellulitis of right lower limb: Secondary | ICD-10-CM | POA: Diagnosis not present

## 2017-04-28 DIAGNOSIS — Z87891 Personal history of nicotine dependence: Secondary | ICD-10-CM | POA: Diagnosis not present

## 2017-04-28 DIAGNOSIS — E1142 Type 2 diabetes mellitus with diabetic polyneuropathy: Secondary | ICD-10-CM | POA: Diagnosis not present

## 2017-04-28 DIAGNOSIS — Z7982 Long term (current) use of aspirin: Secondary | ICD-10-CM | POA: Diagnosis not present

## 2017-04-28 DIAGNOSIS — I509 Heart failure, unspecified: Secondary | ICD-10-CM | POA: Diagnosis not present

## 2017-04-28 DIAGNOSIS — M1991 Primary osteoarthritis, unspecified site: Secondary | ICD-10-CM | POA: Diagnosis not present

## 2017-04-28 DIAGNOSIS — L97412 Non-pressure chronic ulcer of right heel and midfoot with fat layer exposed: Secondary | ICD-10-CM | POA: Diagnosis not present

## 2017-04-28 DIAGNOSIS — E11621 Type 2 diabetes mellitus with foot ulcer: Secondary | ICD-10-CM | POA: Diagnosis not present

## 2017-04-28 DIAGNOSIS — J449 Chronic obstructive pulmonary disease, unspecified: Secondary | ICD-10-CM | POA: Diagnosis not present

## 2017-04-29 DIAGNOSIS — D509 Iron deficiency anemia, unspecified: Secondary | ICD-10-CM | POA: Diagnosis not present

## 2017-04-30 DIAGNOSIS — I509 Heart failure, unspecified: Secondary | ICD-10-CM | POA: Diagnosis not present

## 2017-04-30 DIAGNOSIS — L97412 Non-pressure chronic ulcer of right heel and midfoot with fat layer exposed: Secondary | ICD-10-CM | POA: Diagnosis not present

## 2017-04-30 DIAGNOSIS — Z794 Long term (current) use of insulin: Secondary | ICD-10-CM | POA: Diagnosis not present

## 2017-04-30 DIAGNOSIS — M1991 Primary osteoarthritis, unspecified site: Secondary | ICD-10-CM | POA: Diagnosis not present

## 2017-04-30 DIAGNOSIS — Z7982 Long term (current) use of aspirin: Secondary | ICD-10-CM | POA: Diagnosis not present

## 2017-04-30 DIAGNOSIS — L03115 Cellulitis of right lower limb: Secondary | ICD-10-CM | POA: Diagnosis not present

## 2017-04-30 DIAGNOSIS — I110 Hypertensive heart disease with heart failure: Secondary | ICD-10-CM | POA: Diagnosis not present

## 2017-04-30 DIAGNOSIS — D509 Iron deficiency anemia, unspecified: Secondary | ICD-10-CM | POA: Diagnosis not present

## 2017-04-30 DIAGNOSIS — E1142 Type 2 diabetes mellitus with diabetic polyneuropathy: Secondary | ICD-10-CM | POA: Diagnosis not present

## 2017-04-30 DIAGNOSIS — Z87891 Personal history of nicotine dependence: Secondary | ICD-10-CM | POA: Diagnosis not present

## 2017-04-30 DIAGNOSIS — M10031 Idiopathic gout, right wrist: Secondary | ICD-10-CM | POA: Diagnosis not present

## 2017-04-30 DIAGNOSIS — E11621 Type 2 diabetes mellitus with foot ulcer: Secondary | ICD-10-CM | POA: Diagnosis not present

## 2017-04-30 DIAGNOSIS — J449 Chronic obstructive pulmonary disease, unspecified: Secondary | ICD-10-CM | POA: Diagnosis not present

## 2017-05-03 DIAGNOSIS — M10031 Idiopathic gout, right wrist: Secondary | ICD-10-CM | POA: Diagnosis not present

## 2017-05-03 DIAGNOSIS — E1142 Type 2 diabetes mellitus with diabetic polyneuropathy: Secondary | ICD-10-CM | POA: Diagnosis not present

## 2017-05-03 DIAGNOSIS — I110 Hypertensive heart disease with heart failure: Secondary | ICD-10-CM | POA: Diagnosis not present

## 2017-05-03 DIAGNOSIS — I509 Heart failure, unspecified: Secondary | ICD-10-CM | POA: Diagnosis not present

## 2017-05-03 DIAGNOSIS — Z87891 Personal history of nicotine dependence: Secondary | ICD-10-CM | POA: Diagnosis not present

## 2017-05-03 DIAGNOSIS — J449 Chronic obstructive pulmonary disease, unspecified: Secondary | ICD-10-CM | POA: Diagnosis not present

## 2017-05-03 DIAGNOSIS — D509 Iron deficiency anemia, unspecified: Secondary | ICD-10-CM | POA: Diagnosis not present

## 2017-05-03 DIAGNOSIS — E11621 Type 2 diabetes mellitus with foot ulcer: Secondary | ICD-10-CM | POA: Diagnosis not present

## 2017-05-03 DIAGNOSIS — Z7982 Long term (current) use of aspirin: Secondary | ICD-10-CM | POA: Diagnosis not present

## 2017-05-03 DIAGNOSIS — L03115 Cellulitis of right lower limb: Secondary | ICD-10-CM | POA: Diagnosis not present

## 2017-05-03 DIAGNOSIS — Z794 Long term (current) use of insulin: Secondary | ICD-10-CM | POA: Diagnosis not present

## 2017-05-03 DIAGNOSIS — L97412 Non-pressure chronic ulcer of right heel and midfoot with fat layer exposed: Secondary | ICD-10-CM | POA: Diagnosis not present

## 2017-05-03 DIAGNOSIS — M1991 Primary osteoarthritis, unspecified site: Secondary | ICD-10-CM | POA: Diagnosis not present

## 2017-05-05 DIAGNOSIS — E1142 Type 2 diabetes mellitus with diabetic polyneuropathy: Secondary | ICD-10-CM | POA: Diagnosis not present

## 2017-05-05 DIAGNOSIS — Z7982 Long term (current) use of aspirin: Secondary | ICD-10-CM | POA: Diagnosis not present

## 2017-05-05 DIAGNOSIS — Z87891 Personal history of nicotine dependence: Secondary | ICD-10-CM | POA: Diagnosis not present

## 2017-05-05 DIAGNOSIS — I509 Heart failure, unspecified: Secondary | ICD-10-CM | POA: Diagnosis not present

## 2017-05-05 DIAGNOSIS — M10031 Idiopathic gout, right wrist: Secondary | ICD-10-CM | POA: Diagnosis not present

## 2017-05-05 DIAGNOSIS — L03115 Cellulitis of right lower limb: Secondary | ICD-10-CM | POA: Diagnosis not present

## 2017-05-05 DIAGNOSIS — Z794 Long term (current) use of insulin: Secondary | ICD-10-CM | POA: Diagnosis not present

## 2017-05-05 DIAGNOSIS — L97412 Non-pressure chronic ulcer of right heel and midfoot with fat layer exposed: Secondary | ICD-10-CM | POA: Diagnosis not present

## 2017-05-05 DIAGNOSIS — I110 Hypertensive heart disease with heart failure: Secondary | ICD-10-CM | POA: Diagnosis not present

## 2017-05-05 DIAGNOSIS — J449 Chronic obstructive pulmonary disease, unspecified: Secondary | ICD-10-CM | POA: Diagnosis not present

## 2017-05-05 DIAGNOSIS — M1991 Primary osteoarthritis, unspecified site: Secondary | ICD-10-CM | POA: Diagnosis not present

## 2017-05-05 DIAGNOSIS — D509 Iron deficiency anemia, unspecified: Secondary | ICD-10-CM | POA: Diagnosis not present

## 2017-05-05 DIAGNOSIS — E11621 Type 2 diabetes mellitus with foot ulcer: Secondary | ICD-10-CM | POA: Diagnosis not present

## 2017-05-07 DIAGNOSIS — J449 Chronic obstructive pulmonary disease, unspecified: Secondary | ICD-10-CM | POA: Diagnosis not present

## 2017-05-07 DIAGNOSIS — Z87891 Personal history of nicotine dependence: Secondary | ICD-10-CM | POA: Diagnosis not present

## 2017-05-07 DIAGNOSIS — I509 Heart failure, unspecified: Secondary | ICD-10-CM | POA: Diagnosis not present

## 2017-05-07 DIAGNOSIS — D509 Iron deficiency anemia, unspecified: Secondary | ICD-10-CM | POA: Diagnosis not present

## 2017-05-07 DIAGNOSIS — E1142 Type 2 diabetes mellitus with diabetic polyneuropathy: Secondary | ICD-10-CM | POA: Diagnosis not present

## 2017-05-07 DIAGNOSIS — L97412 Non-pressure chronic ulcer of right heel and midfoot with fat layer exposed: Secondary | ICD-10-CM | POA: Diagnosis not present

## 2017-05-07 DIAGNOSIS — E11621 Type 2 diabetes mellitus with foot ulcer: Secondary | ICD-10-CM | POA: Diagnosis not present

## 2017-05-07 DIAGNOSIS — M10031 Idiopathic gout, right wrist: Secondary | ICD-10-CM | POA: Diagnosis not present

## 2017-05-07 DIAGNOSIS — I110 Hypertensive heart disease with heart failure: Secondary | ICD-10-CM | POA: Diagnosis not present

## 2017-05-07 DIAGNOSIS — Z794 Long term (current) use of insulin: Secondary | ICD-10-CM | POA: Diagnosis not present

## 2017-05-07 DIAGNOSIS — L03115 Cellulitis of right lower limb: Secondary | ICD-10-CM | POA: Diagnosis not present

## 2017-05-07 DIAGNOSIS — M1991 Primary osteoarthritis, unspecified site: Secondary | ICD-10-CM | POA: Diagnosis not present

## 2017-05-07 DIAGNOSIS — Z7982 Long term (current) use of aspirin: Secondary | ICD-10-CM | POA: Diagnosis not present

## 2017-05-08 DIAGNOSIS — J449 Chronic obstructive pulmonary disease, unspecified: Secondary | ICD-10-CM | POA: Diagnosis not present

## 2017-05-10 DIAGNOSIS — Z87891 Personal history of nicotine dependence: Secondary | ICD-10-CM | POA: Diagnosis not present

## 2017-05-10 DIAGNOSIS — L03115 Cellulitis of right lower limb: Secondary | ICD-10-CM | POA: Diagnosis not present

## 2017-05-10 DIAGNOSIS — E1343 Other specified diabetes mellitus with diabetic autonomic (poly)neuropathy: Secondary | ICD-10-CM | POA: Diagnosis not present

## 2017-05-10 DIAGNOSIS — E1142 Type 2 diabetes mellitus with diabetic polyneuropathy: Secondary | ICD-10-CM | POA: Diagnosis not present

## 2017-05-10 DIAGNOSIS — M1991 Primary osteoarthritis, unspecified site: Secondary | ICD-10-CM | POA: Diagnosis not present

## 2017-05-10 DIAGNOSIS — E11621 Type 2 diabetes mellitus with foot ulcer: Secondary | ICD-10-CM | POA: Diagnosis not present

## 2017-05-10 DIAGNOSIS — I509 Heart failure, unspecified: Secondary | ICD-10-CM | POA: Diagnosis not present

## 2017-05-10 DIAGNOSIS — Z7982 Long term (current) use of aspirin: Secondary | ICD-10-CM | POA: Diagnosis not present

## 2017-05-10 DIAGNOSIS — E119 Type 2 diabetes mellitus without complications: Secondary | ICD-10-CM | POA: Diagnosis not present

## 2017-05-10 DIAGNOSIS — Z794 Long term (current) use of insulin: Secondary | ICD-10-CM | POA: Diagnosis not present

## 2017-05-10 DIAGNOSIS — L97412 Non-pressure chronic ulcer of right heel and midfoot with fat layer exposed: Secondary | ICD-10-CM | POA: Diagnosis not present

## 2017-05-10 DIAGNOSIS — D509 Iron deficiency anemia, unspecified: Secondary | ICD-10-CM | POA: Diagnosis not present

## 2017-05-10 DIAGNOSIS — I110 Hypertensive heart disease with heart failure: Secondary | ICD-10-CM | POA: Diagnosis not present

## 2017-05-10 DIAGNOSIS — M10031 Idiopathic gout, right wrist: Secondary | ICD-10-CM | POA: Diagnosis not present

## 2017-05-10 DIAGNOSIS — E785 Hyperlipidemia, unspecified: Secondary | ICD-10-CM | POA: Diagnosis not present

## 2017-05-10 DIAGNOSIS — L0390 Cellulitis, unspecified: Secondary | ICD-10-CM | POA: Diagnosis not present

## 2017-05-10 DIAGNOSIS — J449 Chronic obstructive pulmonary disease, unspecified: Secondary | ICD-10-CM | POA: Diagnosis not present

## 2017-05-12 DIAGNOSIS — L03115 Cellulitis of right lower limb: Secondary | ICD-10-CM | POA: Diagnosis not present

## 2017-05-12 DIAGNOSIS — J449 Chronic obstructive pulmonary disease, unspecified: Secondary | ICD-10-CM | POA: Diagnosis not present

## 2017-05-12 DIAGNOSIS — M1991 Primary osteoarthritis, unspecified site: Secondary | ICD-10-CM | POA: Diagnosis not present

## 2017-05-12 DIAGNOSIS — Z794 Long term (current) use of insulin: Secondary | ICD-10-CM | POA: Diagnosis not present

## 2017-05-12 DIAGNOSIS — E11621 Type 2 diabetes mellitus with foot ulcer: Secondary | ICD-10-CM | POA: Diagnosis not present

## 2017-05-12 DIAGNOSIS — I110 Hypertensive heart disease with heart failure: Secondary | ICD-10-CM | POA: Diagnosis not present

## 2017-05-12 DIAGNOSIS — I509 Heart failure, unspecified: Secondary | ICD-10-CM | POA: Diagnosis not present

## 2017-05-12 DIAGNOSIS — L97412 Non-pressure chronic ulcer of right heel and midfoot with fat layer exposed: Secondary | ICD-10-CM | POA: Diagnosis not present

## 2017-05-12 DIAGNOSIS — D509 Iron deficiency anemia, unspecified: Secondary | ICD-10-CM | POA: Diagnosis not present

## 2017-05-12 DIAGNOSIS — E1142 Type 2 diabetes mellitus with diabetic polyneuropathy: Secondary | ICD-10-CM | POA: Diagnosis not present

## 2017-05-12 DIAGNOSIS — Z87891 Personal history of nicotine dependence: Secondary | ICD-10-CM | POA: Diagnosis not present

## 2017-05-12 DIAGNOSIS — M10031 Idiopathic gout, right wrist: Secondary | ICD-10-CM | POA: Diagnosis not present

## 2017-05-12 DIAGNOSIS — Z7982 Long term (current) use of aspirin: Secondary | ICD-10-CM | POA: Diagnosis not present

## 2017-05-13 ENCOUNTER — Ambulatory Visit: Payer: Medicare HMO | Admitting: Sports Medicine

## 2017-05-14 DIAGNOSIS — M10031 Idiopathic gout, right wrist: Secondary | ICD-10-CM | POA: Diagnosis not present

## 2017-05-14 DIAGNOSIS — D509 Iron deficiency anemia, unspecified: Secondary | ICD-10-CM | POA: Diagnosis not present

## 2017-05-14 DIAGNOSIS — M1991 Primary osteoarthritis, unspecified site: Secondary | ICD-10-CM | POA: Diagnosis not present

## 2017-05-14 DIAGNOSIS — I509 Heart failure, unspecified: Secondary | ICD-10-CM | POA: Diagnosis not present

## 2017-05-14 DIAGNOSIS — I110 Hypertensive heart disease with heart failure: Secondary | ICD-10-CM | POA: Diagnosis not present

## 2017-05-14 DIAGNOSIS — Z794 Long term (current) use of insulin: Secondary | ICD-10-CM | POA: Diagnosis not present

## 2017-05-14 DIAGNOSIS — E11621 Type 2 diabetes mellitus with foot ulcer: Secondary | ICD-10-CM | POA: Diagnosis not present

## 2017-05-14 DIAGNOSIS — Z87891 Personal history of nicotine dependence: Secondary | ICD-10-CM | POA: Diagnosis not present

## 2017-05-14 DIAGNOSIS — L03115 Cellulitis of right lower limb: Secondary | ICD-10-CM | POA: Diagnosis not present

## 2017-05-14 DIAGNOSIS — L97412 Non-pressure chronic ulcer of right heel and midfoot with fat layer exposed: Secondary | ICD-10-CM | POA: Diagnosis not present

## 2017-05-14 DIAGNOSIS — J449 Chronic obstructive pulmonary disease, unspecified: Secondary | ICD-10-CM | POA: Diagnosis not present

## 2017-05-14 DIAGNOSIS — Z7982 Long term (current) use of aspirin: Secondary | ICD-10-CM | POA: Diagnosis not present

## 2017-05-14 DIAGNOSIS — E1142 Type 2 diabetes mellitus with diabetic polyneuropathy: Secondary | ICD-10-CM | POA: Diagnosis not present

## 2017-05-17 DIAGNOSIS — M10031 Idiopathic gout, right wrist: Secondary | ICD-10-CM | POA: Diagnosis not present

## 2017-05-17 DIAGNOSIS — I110 Hypertensive heart disease with heart failure: Secondary | ICD-10-CM | POA: Diagnosis not present

## 2017-05-17 DIAGNOSIS — L97412 Non-pressure chronic ulcer of right heel and midfoot with fat layer exposed: Secondary | ICD-10-CM | POA: Diagnosis not present

## 2017-05-17 DIAGNOSIS — J449 Chronic obstructive pulmonary disease, unspecified: Secondary | ICD-10-CM | POA: Diagnosis not present

## 2017-05-17 DIAGNOSIS — E1142 Type 2 diabetes mellitus with diabetic polyneuropathy: Secondary | ICD-10-CM | POA: Diagnosis not present

## 2017-05-17 DIAGNOSIS — M1991 Primary osteoarthritis, unspecified site: Secondary | ICD-10-CM | POA: Diagnosis not present

## 2017-05-17 DIAGNOSIS — I509 Heart failure, unspecified: Secondary | ICD-10-CM | POA: Diagnosis not present

## 2017-05-17 DIAGNOSIS — L03115 Cellulitis of right lower limb: Secondary | ICD-10-CM | POA: Diagnosis not present

## 2017-05-17 DIAGNOSIS — Z87891 Personal history of nicotine dependence: Secondary | ICD-10-CM | POA: Diagnosis not present

## 2017-05-17 DIAGNOSIS — Z7982 Long term (current) use of aspirin: Secondary | ICD-10-CM | POA: Diagnosis not present

## 2017-05-17 DIAGNOSIS — D509 Iron deficiency anemia, unspecified: Secondary | ICD-10-CM | POA: Diagnosis not present

## 2017-05-17 DIAGNOSIS — E11621 Type 2 diabetes mellitus with foot ulcer: Secondary | ICD-10-CM | POA: Diagnosis not present

## 2017-05-17 DIAGNOSIS — Z794 Long term (current) use of insulin: Secondary | ICD-10-CM | POA: Diagnosis not present

## 2017-05-19 DIAGNOSIS — I509 Heart failure, unspecified: Secondary | ICD-10-CM | POA: Diagnosis not present

## 2017-05-19 DIAGNOSIS — D509 Iron deficiency anemia, unspecified: Secondary | ICD-10-CM | POA: Diagnosis not present

## 2017-05-19 DIAGNOSIS — M1991 Primary osteoarthritis, unspecified site: Secondary | ICD-10-CM | POA: Diagnosis not present

## 2017-05-19 DIAGNOSIS — L03115 Cellulitis of right lower limb: Secondary | ICD-10-CM | POA: Diagnosis not present

## 2017-05-19 DIAGNOSIS — Z794 Long term (current) use of insulin: Secondary | ICD-10-CM | POA: Diagnosis not present

## 2017-05-19 DIAGNOSIS — E11621 Type 2 diabetes mellitus with foot ulcer: Secondary | ICD-10-CM | POA: Diagnosis not present

## 2017-05-19 DIAGNOSIS — J449 Chronic obstructive pulmonary disease, unspecified: Secondary | ICD-10-CM | POA: Diagnosis not present

## 2017-05-19 DIAGNOSIS — L97412 Non-pressure chronic ulcer of right heel and midfoot with fat layer exposed: Secondary | ICD-10-CM | POA: Diagnosis not present

## 2017-05-19 DIAGNOSIS — M10031 Idiopathic gout, right wrist: Secondary | ICD-10-CM | POA: Diagnosis not present

## 2017-05-19 DIAGNOSIS — E1142 Type 2 diabetes mellitus with diabetic polyneuropathy: Secondary | ICD-10-CM | POA: Diagnosis not present

## 2017-05-19 DIAGNOSIS — Z87891 Personal history of nicotine dependence: Secondary | ICD-10-CM | POA: Diagnosis not present

## 2017-05-19 DIAGNOSIS — Z7982 Long term (current) use of aspirin: Secondary | ICD-10-CM | POA: Diagnosis not present

## 2017-05-19 DIAGNOSIS — I110 Hypertensive heart disease with heart failure: Secondary | ICD-10-CM | POA: Diagnosis not present

## 2017-05-21 DIAGNOSIS — M1991 Primary osteoarthritis, unspecified site: Secondary | ICD-10-CM | POA: Diagnosis not present

## 2017-05-21 DIAGNOSIS — D509 Iron deficiency anemia, unspecified: Secondary | ICD-10-CM | POA: Diagnosis not present

## 2017-05-21 DIAGNOSIS — J449 Chronic obstructive pulmonary disease, unspecified: Secondary | ICD-10-CM | POA: Diagnosis not present

## 2017-05-21 DIAGNOSIS — M10031 Idiopathic gout, right wrist: Secondary | ICD-10-CM | POA: Diagnosis not present

## 2017-05-21 DIAGNOSIS — I509 Heart failure, unspecified: Secondary | ICD-10-CM | POA: Diagnosis not present

## 2017-05-21 DIAGNOSIS — E1142 Type 2 diabetes mellitus with diabetic polyneuropathy: Secondary | ICD-10-CM | POA: Diagnosis not present

## 2017-05-21 DIAGNOSIS — Z87891 Personal history of nicotine dependence: Secondary | ICD-10-CM | POA: Diagnosis not present

## 2017-05-21 DIAGNOSIS — I110 Hypertensive heart disease with heart failure: Secondary | ICD-10-CM | POA: Diagnosis not present

## 2017-05-21 DIAGNOSIS — E11621 Type 2 diabetes mellitus with foot ulcer: Secondary | ICD-10-CM | POA: Diagnosis not present

## 2017-05-21 DIAGNOSIS — Z794 Long term (current) use of insulin: Secondary | ICD-10-CM | POA: Diagnosis not present

## 2017-05-21 DIAGNOSIS — L03115 Cellulitis of right lower limb: Secondary | ICD-10-CM | POA: Diagnosis not present

## 2017-05-21 DIAGNOSIS — L97412 Non-pressure chronic ulcer of right heel and midfoot with fat layer exposed: Secondary | ICD-10-CM | POA: Diagnosis not present

## 2017-05-21 DIAGNOSIS — Z7982 Long term (current) use of aspirin: Secondary | ICD-10-CM | POA: Diagnosis not present

## 2017-05-24 DIAGNOSIS — I110 Hypertensive heart disease with heart failure: Secondary | ICD-10-CM | POA: Diagnosis not present

## 2017-05-24 DIAGNOSIS — J449 Chronic obstructive pulmonary disease, unspecified: Secondary | ICD-10-CM | POA: Diagnosis not present

## 2017-05-24 DIAGNOSIS — L03115 Cellulitis of right lower limb: Secondary | ICD-10-CM | POA: Diagnosis not present

## 2017-05-24 DIAGNOSIS — M10031 Idiopathic gout, right wrist: Secondary | ICD-10-CM | POA: Diagnosis not present

## 2017-05-24 DIAGNOSIS — Z794 Long term (current) use of insulin: Secondary | ICD-10-CM | POA: Diagnosis not present

## 2017-05-24 DIAGNOSIS — D509 Iron deficiency anemia, unspecified: Secondary | ICD-10-CM | POA: Diagnosis not present

## 2017-05-24 DIAGNOSIS — L97412 Non-pressure chronic ulcer of right heel and midfoot with fat layer exposed: Secondary | ICD-10-CM | POA: Diagnosis not present

## 2017-05-24 DIAGNOSIS — M1991 Primary osteoarthritis, unspecified site: Secondary | ICD-10-CM | POA: Diagnosis not present

## 2017-05-24 DIAGNOSIS — Z7982 Long term (current) use of aspirin: Secondary | ICD-10-CM | POA: Diagnosis not present

## 2017-05-24 DIAGNOSIS — E1142 Type 2 diabetes mellitus with diabetic polyneuropathy: Secondary | ICD-10-CM | POA: Diagnosis not present

## 2017-05-24 DIAGNOSIS — Z87891 Personal history of nicotine dependence: Secondary | ICD-10-CM | POA: Diagnosis not present

## 2017-05-24 DIAGNOSIS — I509 Heart failure, unspecified: Secondary | ICD-10-CM | POA: Diagnosis not present

## 2017-05-24 DIAGNOSIS — E11621 Type 2 diabetes mellitus with foot ulcer: Secondary | ICD-10-CM | POA: Diagnosis not present

## 2017-05-25 DIAGNOSIS — E119 Type 2 diabetes mellitus without complications: Secondary | ICD-10-CM | POA: Diagnosis not present

## 2017-05-25 DIAGNOSIS — L97412 Non-pressure chronic ulcer of right heel and midfoot with fat layer exposed: Secondary | ICD-10-CM | POA: Diagnosis not present

## 2017-05-25 DIAGNOSIS — I872 Venous insufficiency (chronic) (peripheral): Secondary | ICD-10-CM | POA: Diagnosis not present

## 2017-05-28 DIAGNOSIS — I509 Heart failure, unspecified: Secondary | ICD-10-CM | POA: Diagnosis not present

## 2017-05-28 DIAGNOSIS — E1142 Type 2 diabetes mellitus with diabetic polyneuropathy: Secondary | ICD-10-CM | POA: Diagnosis not present

## 2017-05-28 DIAGNOSIS — L03115 Cellulitis of right lower limb: Secondary | ICD-10-CM | POA: Diagnosis not present

## 2017-05-28 DIAGNOSIS — Z87891 Personal history of nicotine dependence: Secondary | ICD-10-CM | POA: Diagnosis not present

## 2017-05-28 DIAGNOSIS — I110 Hypertensive heart disease with heart failure: Secondary | ICD-10-CM | POA: Diagnosis not present

## 2017-05-28 DIAGNOSIS — M10031 Idiopathic gout, right wrist: Secondary | ICD-10-CM | POA: Diagnosis not present

## 2017-05-28 DIAGNOSIS — D509 Iron deficiency anemia, unspecified: Secondary | ICD-10-CM | POA: Diagnosis not present

## 2017-05-28 DIAGNOSIS — L97412 Non-pressure chronic ulcer of right heel and midfoot with fat layer exposed: Secondary | ICD-10-CM | POA: Diagnosis not present

## 2017-05-28 DIAGNOSIS — Z794 Long term (current) use of insulin: Secondary | ICD-10-CM | POA: Diagnosis not present

## 2017-05-28 DIAGNOSIS — E11621 Type 2 diabetes mellitus with foot ulcer: Secondary | ICD-10-CM | POA: Diagnosis not present

## 2017-05-28 DIAGNOSIS — M1991 Primary osteoarthritis, unspecified site: Secondary | ICD-10-CM | POA: Diagnosis not present

## 2017-05-28 DIAGNOSIS — J449 Chronic obstructive pulmonary disease, unspecified: Secondary | ICD-10-CM | POA: Diagnosis not present

## 2017-05-28 DIAGNOSIS — Z7982 Long term (current) use of aspirin: Secondary | ICD-10-CM | POA: Diagnosis not present

## 2017-05-31 DIAGNOSIS — J449 Chronic obstructive pulmonary disease, unspecified: Secondary | ICD-10-CM | POA: Diagnosis not present

## 2017-05-31 DIAGNOSIS — E1142 Type 2 diabetes mellitus with diabetic polyneuropathy: Secondary | ICD-10-CM | POA: Diagnosis not present

## 2017-05-31 DIAGNOSIS — L03115 Cellulitis of right lower limb: Secondary | ICD-10-CM | POA: Diagnosis not present

## 2017-05-31 DIAGNOSIS — I110 Hypertensive heart disease with heart failure: Secondary | ICD-10-CM | POA: Diagnosis not present

## 2017-05-31 DIAGNOSIS — Z794 Long term (current) use of insulin: Secondary | ICD-10-CM | POA: Diagnosis not present

## 2017-05-31 DIAGNOSIS — Z7982 Long term (current) use of aspirin: Secondary | ICD-10-CM | POA: Diagnosis not present

## 2017-05-31 DIAGNOSIS — D509 Iron deficiency anemia, unspecified: Secondary | ICD-10-CM | POA: Diagnosis not present

## 2017-05-31 DIAGNOSIS — M10031 Idiopathic gout, right wrist: Secondary | ICD-10-CM | POA: Diagnosis not present

## 2017-05-31 DIAGNOSIS — Z87891 Personal history of nicotine dependence: Secondary | ICD-10-CM | POA: Diagnosis not present

## 2017-05-31 DIAGNOSIS — L97412 Non-pressure chronic ulcer of right heel and midfoot with fat layer exposed: Secondary | ICD-10-CM | POA: Diagnosis not present

## 2017-05-31 DIAGNOSIS — I509 Heart failure, unspecified: Secondary | ICD-10-CM | POA: Diagnosis not present

## 2017-05-31 DIAGNOSIS — E11621 Type 2 diabetes mellitus with foot ulcer: Secondary | ICD-10-CM | POA: Diagnosis not present

## 2017-05-31 DIAGNOSIS — M1991 Primary osteoarthritis, unspecified site: Secondary | ICD-10-CM | POA: Diagnosis not present

## 2017-06-01 DIAGNOSIS — E785 Hyperlipidemia, unspecified: Secondary | ICD-10-CM | POA: Diagnosis not present

## 2017-06-01 DIAGNOSIS — J449 Chronic obstructive pulmonary disease, unspecified: Secondary | ICD-10-CM | POA: Diagnosis not present

## 2017-06-01 DIAGNOSIS — E119 Type 2 diabetes mellitus without complications: Secondary | ICD-10-CM | POA: Diagnosis not present

## 2017-06-01 DIAGNOSIS — I10 Essential (primary) hypertension: Secondary | ICD-10-CM | POA: Diagnosis not present

## 2017-06-01 DIAGNOSIS — E1343 Other specified diabetes mellitus with diabetic autonomic (poly)neuropathy: Secondary | ICD-10-CM | POA: Diagnosis not present

## 2017-06-02 DIAGNOSIS — Z87891 Personal history of nicotine dependence: Secondary | ICD-10-CM | POA: Diagnosis not present

## 2017-06-02 DIAGNOSIS — D509 Iron deficiency anemia, unspecified: Secondary | ICD-10-CM | POA: Diagnosis not present

## 2017-06-02 DIAGNOSIS — J449 Chronic obstructive pulmonary disease, unspecified: Secondary | ICD-10-CM | POA: Diagnosis not present

## 2017-06-02 DIAGNOSIS — Z7982 Long term (current) use of aspirin: Secondary | ICD-10-CM | POA: Diagnosis not present

## 2017-06-02 DIAGNOSIS — L97412 Non-pressure chronic ulcer of right heel and midfoot with fat layer exposed: Secondary | ICD-10-CM | POA: Diagnosis not present

## 2017-06-02 DIAGNOSIS — E1142 Type 2 diabetes mellitus with diabetic polyneuropathy: Secondary | ICD-10-CM | POA: Diagnosis not present

## 2017-06-02 DIAGNOSIS — I509 Heart failure, unspecified: Secondary | ICD-10-CM | POA: Diagnosis not present

## 2017-06-02 DIAGNOSIS — L03115 Cellulitis of right lower limb: Secondary | ICD-10-CM | POA: Diagnosis not present

## 2017-06-02 DIAGNOSIS — Z794 Long term (current) use of insulin: Secondary | ICD-10-CM | POA: Diagnosis not present

## 2017-06-02 DIAGNOSIS — M10031 Idiopathic gout, right wrist: Secondary | ICD-10-CM | POA: Diagnosis not present

## 2017-06-02 DIAGNOSIS — E11621 Type 2 diabetes mellitus with foot ulcer: Secondary | ICD-10-CM | POA: Diagnosis not present

## 2017-06-02 DIAGNOSIS — M1991 Primary osteoarthritis, unspecified site: Secondary | ICD-10-CM | POA: Diagnosis not present

## 2017-06-02 DIAGNOSIS — I110 Hypertensive heart disease with heart failure: Secondary | ICD-10-CM | POA: Diagnosis not present

## 2017-06-04 DIAGNOSIS — E1142 Type 2 diabetes mellitus with diabetic polyneuropathy: Secondary | ICD-10-CM | POA: Diagnosis not present

## 2017-06-04 DIAGNOSIS — Z794 Long term (current) use of insulin: Secondary | ICD-10-CM | POA: Diagnosis not present

## 2017-06-04 DIAGNOSIS — E11621 Type 2 diabetes mellitus with foot ulcer: Secondary | ICD-10-CM | POA: Diagnosis not present

## 2017-06-04 DIAGNOSIS — L97412 Non-pressure chronic ulcer of right heel and midfoot with fat layer exposed: Secondary | ICD-10-CM | POA: Diagnosis not present

## 2017-06-04 DIAGNOSIS — D509 Iron deficiency anemia, unspecified: Secondary | ICD-10-CM | POA: Diagnosis not present

## 2017-06-04 DIAGNOSIS — M1991 Primary osteoarthritis, unspecified site: Secondary | ICD-10-CM | POA: Diagnosis not present

## 2017-06-04 DIAGNOSIS — Z87891 Personal history of nicotine dependence: Secondary | ICD-10-CM | POA: Diagnosis not present

## 2017-06-04 DIAGNOSIS — I110 Hypertensive heart disease with heart failure: Secondary | ICD-10-CM | POA: Diagnosis not present

## 2017-06-04 DIAGNOSIS — J449 Chronic obstructive pulmonary disease, unspecified: Secondary | ICD-10-CM | POA: Diagnosis not present

## 2017-06-04 DIAGNOSIS — Z7982 Long term (current) use of aspirin: Secondary | ICD-10-CM | POA: Diagnosis not present

## 2017-06-04 DIAGNOSIS — M10031 Idiopathic gout, right wrist: Secondary | ICD-10-CM | POA: Diagnosis not present

## 2017-06-04 DIAGNOSIS — I509 Heart failure, unspecified: Secondary | ICD-10-CM | POA: Diagnosis not present

## 2017-06-05 DIAGNOSIS — J449 Chronic obstructive pulmonary disease, unspecified: Secondary | ICD-10-CM | POA: Diagnosis not present

## 2017-06-07 DIAGNOSIS — J449 Chronic obstructive pulmonary disease, unspecified: Secondary | ICD-10-CM | POA: Diagnosis not present

## 2017-06-07 DIAGNOSIS — Z87891 Personal history of nicotine dependence: Secondary | ICD-10-CM | POA: Diagnosis not present

## 2017-06-07 DIAGNOSIS — E11621 Type 2 diabetes mellitus with foot ulcer: Secondary | ICD-10-CM | POA: Diagnosis not present

## 2017-06-07 DIAGNOSIS — I110 Hypertensive heart disease with heart failure: Secondary | ICD-10-CM | POA: Diagnosis not present

## 2017-06-07 DIAGNOSIS — L97412 Non-pressure chronic ulcer of right heel and midfoot with fat layer exposed: Secondary | ICD-10-CM | POA: Diagnosis not present

## 2017-06-07 DIAGNOSIS — M10031 Idiopathic gout, right wrist: Secondary | ICD-10-CM | POA: Diagnosis not present

## 2017-06-07 DIAGNOSIS — E1142 Type 2 diabetes mellitus with diabetic polyneuropathy: Secondary | ICD-10-CM | POA: Diagnosis not present

## 2017-06-07 DIAGNOSIS — M1991 Primary osteoarthritis, unspecified site: Secondary | ICD-10-CM | POA: Diagnosis not present

## 2017-06-07 DIAGNOSIS — Z794 Long term (current) use of insulin: Secondary | ICD-10-CM | POA: Diagnosis not present

## 2017-06-07 DIAGNOSIS — D509 Iron deficiency anemia, unspecified: Secondary | ICD-10-CM | POA: Diagnosis not present

## 2017-06-07 DIAGNOSIS — I509 Heart failure, unspecified: Secondary | ICD-10-CM | POA: Diagnosis not present

## 2017-06-07 DIAGNOSIS — Z7982 Long term (current) use of aspirin: Secondary | ICD-10-CM | POA: Diagnosis not present

## 2017-06-09 DIAGNOSIS — J449 Chronic obstructive pulmonary disease, unspecified: Secondary | ICD-10-CM | POA: Diagnosis not present

## 2017-06-09 DIAGNOSIS — E1142 Type 2 diabetes mellitus with diabetic polyneuropathy: Secondary | ICD-10-CM | POA: Diagnosis not present

## 2017-06-09 DIAGNOSIS — E11621 Type 2 diabetes mellitus with foot ulcer: Secondary | ICD-10-CM | POA: Diagnosis not present

## 2017-06-09 DIAGNOSIS — M1991 Primary osteoarthritis, unspecified site: Secondary | ICD-10-CM | POA: Diagnosis not present

## 2017-06-09 DIAGNOSIS — L97412 Non-pressure chronic ulcer of right heel and midfoot with fat layer exposed: Secondary | ICD-10-CM | POA: Diagnosis not present

## 2017-06-09 DIAGNOSIS — Z87891 Personal history of nicotine dependence: Secondary | ICD-10-CM | POA: Diagnosis not present

## 2017-06-09 DIAGNOSIS — I110 Hypertensive heart disease with heart failure: Secondary | ICD-10-CM | POA: Diagnosis not present

## 2017-06-09 DIAGNOSIS — M10031 Idiopathic gout, right wrist: Secondary | ICD-10-CM | POA: Diagnosis not present

## 2017-06-09 DIAGNOSIS — Z794 Long term (current) use of insulin: Secondary | ICD-10-CM | POA: Diagnosis not present

## 2017-06-09 DIAGNOSIS — I509 Heart failure, unspecified: Secondary | ICD-10-CM | POA: Diagnosis not present

## 2017-06-09 DIAGNOSIS — Z7982 Long term (current) use of aspirin: Secondary | ICD-10-CM | POA: Diagnosis not present

## 2017-06-09 DIAGNOSIS — D509 Iron deficiency anemia, unspecified: Secondary | ICD-10-CM | POA: Diagnosis not present

## 2017-06-11 DIAGNOSIS — I509 Heart failure, unspecified: Secondary | ICD-10-CM | POA: Diagnosis not present

## 2017-06-11 DIAGNOSIS — L97412 Non-pressure chronic ulcer of right heel and midfoot with fat layer exposed: Secondary | ICD-10-CM | POA: Diagnosis not present

## 2017-06-11 DIAGNOSIS — E11621 Type 2 diabetes mellitus with foot ulcer: Secondary | ICD-10-CM | POA: Diagnosis not present

## 2017-06-11 DIAGNOSIS — J449 Chronic obstructive pulmonary disease, unspecified: Secondary | ICD-10-CM | POA: Diagnosis not present

## 2017-06-11 DIAGNOSIS — D509 Iron deficiency anemia, unspecified: Secondary | ICD-10-CM | POA: Diagnosis not present

## 2017-06-11 DIAGNOSIS — E1142 Type 2 diabetes mellitus with diabetic polyneuropathy: Secondary | ICD-10-CM | POA: Diagnosis not present

## 2017-06-11 DIAGNOSIS — M1991 Primary osteoarthritis, unspecified site: Secondary | ICD-10-CM | POA: Diagnosis not present

## 2017-06-11 DIAGNOSIS — I110 Hypertensive heart disease with heart failure: Secondary | ICD-10-CM | POA: Diagnosis not present

## 2017-06-11 DIAGNOSIS — Z7982 Long term (current) use of aspirin: Secondary | ICD-10-CM | POA: Diagnosis not present

## 2017-06-11 DIAGNOSIS — M10031 Idiopathic gout, right wrist: Secondary | ICD-10-CM | POA: Diagnosis not present

## 2017-06-11 DIAGNOSIS — Z794 Long term (current) use of insulin: Secondary | ICD-10-CM | POA: Diagnosis not present

## 2017-06-11 DIAGNOSIS — Z87891 Personal history of nicotine dependence: Secondary | ICD-10-CM | POA: Diagnosis not present

## 2017-06-14 DIAGNOSIS — I110 Hypertensive heart disease with heart failure: Secondary | ICD-10-CM | POA: Diagnosis not present

## 2017-06-14 DIAGNOSIS — E11621 Type 2 diabetes mellitus with foot ulcer: Secondary | ICD-10-CM | POA: Diagnosis not present

## 2017-06-14 DIAGNOSIS — Z794 Long term (current) use of insulin: Secondary | ICD-10-CM | POA: Diagnosis not present

## 2017-06-14 DIAGNOSIS — M10031 Idiopathic gout, right wrist: Secondary | ICD-10-CM | POA: Diagnosis not present

## 2017-06-14 DIAGNOSIS — M1991 Primary osteoarthritis, unspecified site: Secondary | ICD-10-CM | POA: Diagnosis not present

## 2017-06-14 DIAGNOSIS — E1142 Type 2 diabetes mellitus with diabetic polyneuropathy: Secondary | ICD-10-CM | POA: Diagnosis not present

## 2017-06-14 DIAGNOSIS — Z7982 Long term (current) use of aspirin: Secondary | ICD-10-CM | POA: Diagnosis not present

## 2017-06-14 DIAGNOSIS — I509 Heart failure, unspecified: Secondary | ICD-10-CM | POA: Diagnosis not present

## 2017-06-14 DIAGNOSIS — D509 Iron deficiency anemia, unspecified: Secondary | ICD-10-CM | POA: Diagnosis not present

## 2017-06-14 DIAGNOSIS — Z87891 Personal history of nicotine dependence: Secondary | ICD-10-CM | POA: Diagnosis not present

## 2017-06-14 DIAGNOSIS — J449 Chronic obstructive pulmonary disease, unspecified: Secondary | ICD-10-CM | POA: Diagnosis not present

## 2017-06-14 DIAGNOSIS — L97412 Non-pressure chronic ulcer of right heel and midfoot with fat layer exposed: Secondary | ICD-10-CM | POA: Diagnosis not present

## 2017-06-15 DIAGNOSIS — L97412 Non-pressure chronic ulcer of right heel and midfoot with fat layer exposed: Secondary | ICD-10-CM | POA: Diagnosis not present

## 2017-06-15 DIAGNOSIS — I872 Venous insufficiency (chronic) (peripheral): Secondary | ICD-10-CM | POA: Diagnosis not present

## 2017-06-16 DIAGNOSIS — Z1331 Encounter for screening for depression: Secondary | ICD-10-CM | POA: Diagnosis not present

## 2017-06-16 DIAGNOSIS — E785 Hyperlipidemia, unspecified: Secondary | ICD-10-CM | POA: Diagnosis not present

## 2017-06-16 DIAGNOSIS — E119 Type 2 diabetes mellitus without complications: Secondary | ICD-10-CM | POA: Diagnosis not present

## 2017-06-16 DIAGNOSIS — E1343 Other specified diabetes mellitus with diabetic autonomic (poly)neuropathy: Secondary | ICD-10-CM | POA: Diagnosis not present

## 2017-06-16 DIAGNOSIS — I10 Essential (primary) hypertension: Secondary | ICD-10-CM | POA: Diagnosis not present

## 2017-06-16 DIAGNOSIS — Z9181 History of falling: Secondary | ICD-10-CM | POA: Diagnosis not present

## 2017-06-16 DIAGNOSIS — J449 Chronic obstructive pulmonary disease, unspecified: Secondary | ICD-10-CM | POA: Diagnosis not present

## 2017-06-18 DIAGNOSIS — I110 Hypertensive heart disease with heart failure: Secondary | ICD-10-CM | POA: Diagnosis not present

## 2017-06-18 DIAGNOSIS — Z87891 Personal history of nicotine dependence: Secondary | ICD-10-CM | POA: Diagnosis not present

## 2017-06-18 DIAGNOSIS — L97412 Non-pressure chronic ulcer of right heel and midfoot with fat layer exposed: Secondary | ICD-10-CM | POA: Diagnosis not present

## 2017-06-18 DIAGNOSIS — Z7982 Long term (current) use of aspirin: Secondary | ICD-10-CM | POA: Diagnosis not present

## 2017-06-18 DIAGNOSIS — M1991 Primary osteoarthritis, unspecified site: Secondary | ICD-10-CM | POA: Diagnosis not present

## 2017-06-18 DIAGNOSIS — M10031 Idiopathic gout, right wrist: Secondary | ICD-10-CM | POA: Diagnosis not present

## 2017-06-18 DIAGNOSIS — E11621 Type 2 diabetes mellitus with foot ulcer: Secondary | ICD-10-CM | POA: Diagnosis not present

## 2017-06-18 DIAGNOSIS — Z794 Long term (current) use of insulin: Secondary | ICD-10-CM | POA: Diagnosis not present

## 2017-06-18 DIAGNOSIS — D509 Iron deficiency anemia, unspecified: Secondary | ICD-10-CM | POA: Diagnosis not present

## 2017-06-18 DIAGNOSIS — J449 Chronic obstructive pulmonary disease, unspecified: Secondary | ICD-10-CM | POA: Diagnosis not present

## 2017-06-18 DIAGNOSIS — I509 Heart failure, unspecified: Secondary | ICD-10-CM | POA: Diagnosis not present

## 2017-06-18 DIAGNOSIS — E1142 Type 2 diabetes mellitus with diabetic polyneuropathy: Secondary | ICD-10-CM | POA: Diagnosis not present

## 2017-06-21 DIAGNOSIS — I110 Hypertensive heart disease with heart failure: Secondary | ICD-10-CM | POA: Diagnosis not present

## 2017-06-21 DIAGNOSIS — M1991 Primary osteoarthritis, unspecified site: Secondary | ICD-10-CM | POA: Diagnosis not present

## 2017-06-21 DIAGNOSIS — E1142 Type 2 diabetes mellitus with diabetic polyneuropathy: Secondary | ICD-10-CM | POA: Diagnosis not present

## 2017-06-21 DIAGNOSIS — Z794 Long term (current) use of insulin: Secondary | ICD-10-CM | POA: Diagnosis not present

## 2017-06-21 DIAGNOSIS — J449 Chronic obstructive pulmonary disease, unspecified: Secondary | ICD-10-CM | POA: Diagnosis not present

## 2017-06-21 DIAGNOSIS — Z87891 Personal history of nicotine dependence: Secondary | ICD-10-CM | POA: Diagnosis not present

## 2017-06-21 DIAGNOSIS — E11621 Type 2 diabetes mellitus with foot ulcer: Secondary | ICD-10-CM | POA: Diagnosis not present

## 2017-06-21 DIAGNOSIS — L97412 Non-pressure chronic ulcer of right heel and midfoot with fat layer exposed: Secondary | ICD-10-CM | POA: Diagnosis not present

## 2017-06-21 DIAGNOSIS — D509 Iron deficiency anemia, unspecified: Secondary | ICD-10-CM | POA: Diagnosis not present

## 2017-06-21 DIAGNOSIS — M10031 Idiopathic gout, right wrist: Secondary | ICD-10-CM | POA: Diagnosis not present

## 2017-06-21 DIAGNOSIS — Z7982 Long term (current) use of aspirin: Secondary | ICD-10-CM | POA: Diagnosis not present

## 2017-06-21 DIAGNOSIS — I509 Heart failure, unspecified: Secondary | ICD-10-CM | POA: Diagnosis not present

## 2017-06-22 DIAGNOSIS — E1165 Type 2 diabetes mellitus with hyperglycemia: Secondary | ICD-10-CM | POA: Diagnosis not present

## 2017-06-22 DIAGNOSIS — Z794 Long term (current) use of insulin: Secondary | ICD-10-CM | POA: Diagnosis not present

## 2017-06-22 DIAGNOSIS — E785 Hyperlipidemia, unspecified: Secondary | ICD-10-CM | POA: Diagnosis not present

## 2017-06-22 DIAGNOSIS — I10 Essential (primary) hypertension: Secondary | ICD-10-CM | POA: Diagnosis not present

## 2017-06-23 DIAGNOSIS — Z794 Long term (current) use of insulin: Secondary | ICD-10-CM | POA: Diagnosis not present

## 2017-06-23 DIAGNOSIS — J449 Chronic obstructive pulmonary disease, unspecified: Secondary | ICD-10-CM | POA: Diagnosis not present

## 2017-06-23 DIAGNOSIS — I509 Heart failure, unspecified: Secondary | ICD-10-CM | POA: Diagnosis not present

## 2017-06-23 DIAGNOSIS — M1991 Primary osteoarthritis, unspecified site: Secondary | ICD-10-CM | POA: Diagnosis not present

## 2017-06-23 DIAGNOSIS — D509 Iron deficiency anemia, unspecified: Secondary | ICD-10-CM | POA: Diagnosis not present

## 2017-06-23 DIAGNOSIS — L97412 Non-pressure chronic ulcer of right heel and midfoot with fat layer exposed: Secondary | ICD-10-CM | POA: Diagnosis not present

## 2017-06-23 DIAGNOSIS — E11621 Type 2 diabetes mellitus with foot ulcer: Secondary | ICD-10-CM | POA: Diagnosis not present

## 2017-06-23 DIAGNOSIS — Z7982 Long term (current) use of aspirin: Secondary | ICD-10-CM | POA: Diagnosis not present

## 2017-06-23 DIAGNOSIS — E1142 Type 2 diabetes mellitus with diabetic polyneuropathy: Secondary | ICD-10-CM | POA: Diagnosis not present

## 2017-06-23 DIAGNOSIS — I110 Hypertensive heart disease with heart failure: Secondary | ICD-10-CM | POA: Diagnosis not present

## 2017-06-23 DIAGNOSIS — Z87891 Personal history of nicotine dependence: Secondary | ICD-10-CM | POA: Diagnosis not present

## 2017-06-23 DIAGNOSIS — M10031 Idiopathic gout, right wrist: Secondary | ICD-10-CM | POA: Diagnosis not present

## 2017-06-25 DIAGNOSIS — E11621 Type 2 diabetes mellitus with foot ulcer: Secondary | ICD-10-CM | POA: Diagnosis not present

## 2017-06-25 DIAGNOSIS — Z87891 Personal history of nicotine dependence: Secondary | ICD-10-CM | POA: Diagnosis not present

## 2017-06-25 DIAGNOSIS — Z794 Long term (current) use of insulin: Secondary | ICD-10-CM | POA: Diagnosis not present

## 2017-06-25 DIAGNOSIS — Z7982 Long term (current) use of aspirin: Secondary | ICD-10-CM | POA: Diagnosis not present

## 2017-06-25 DIAGNOSIS — I509 Heart failure, unspecified: Secondary | ICD-10-CM | POA: Diagnosis not present

## 2017-06-25 DIAGNOSIS — I110 Hypertensive heart disease with heart failure: Secondary | ICD-10-CM | POA: Diagnosis not present

## 2017-06-25 DIAGNOSIS — M1991 Primary osteoarthritis, unspecified site: Secondary | ICD-10-CM | POA: Diagnosis not present

## 2017-06-25 DIAGNOSIS — M10031 Idiopathic gout, right wrist: Secondary | ICD-10-CM | POA: Diagnosis not present

## 2017-06-25 DIAGNOSIS — D509 Iron deficiency anemia, unspecified: Secondary | ICD-10-CM | POA: Diagnosis not present

## 2017-06-25 DIAGNOSIS — E1142 Type 2 diabetes mellitus with diabetic polyneuropathy: Secondary | ICD-10-CM | POA: Diagnosis not present

## 2017-06-25 DIAGNOSIS — J449 Chronic obstructive pulmonary disease, unspecified: Secondary | ICD-10-CM | POA: Diagnosis not present

## 2017-06-25 DIAGNOSIS — L97412 Non-pressure chronic ulcer of right heel and midfoot with fat layer exposed: Secondary | ICD-10-CM | POA: Diagnosis not present

## 2017-06-28 DIAGNOSIS — I110 Hypertensive heart disease with heart failure: Secondary | ICD-10-CM | POA: Diagnosis not present

## 2017-06-28 DIAGNOSIS — Z794 Long term (current) use of insulin: Secondary | ICD-10-CM | POA: Diagnosis not present

## 2017-06-28 DIAGNOSIS — L97412 Non-pressure chronic ulcer of right heel and midfoot with fat layer exposed: Secondary | ICD-10-CM | POA: Diagnosis not present

## 2017-06-28 DIAGNOSIS — E11621 Type 2 diabetes mellitus with foot ulcer: Secondary | ICD-10-CM | POA: Diagnosis not present

## 2017-06-28 DIAGNOSIS — I509 Heart failure, unspecified: Secondary | ICD-10-CM | POA: Diagnosis not present

## 2017-06-28 DIAGNOSIS — E1142 Type 2 diabetes mellitus with diabetic polyneuropathy: Secondary | ICD-10-CM | POA: Diagnosis not present

## 2017-06-28 DIAGNOSIS — J449 Chronic obstructive pulmonary disease, unspecified: Secondary | ICD-10-CM | POA: Diagnosis not present

## 2017-06-28 DIAGNOSIS — Z87891 Personal history of nicotine dependence: Secondary | ICD-10-CM | POA: Diagnosis not present

## 2017-06-28 DIAGNOSIS — M10031 Idiopathic gout, right wrist: Secondary | ICD-10-CM | POA: Diagnosis not present

## 2017-06-28 DIAGNOSIS — Z7982 Long term (current) use of aspirin: Secondary | ICD-10-CM | POA: Diagnosis not present

## 2017-06-28 DIAGNOSIS — D509 Iron deficiency anemia, unspecified: Secondary | ICD-10-CM | POA: Diagnosis not present

## 2017-06-28 DIAGNOSIS — M1991 Primary osteoarthritis, unspecified site: Secondary | ICD-10-CM | POA: Diagnosis not present

## 2017-06-30 DIAGNOSIS — Z87891 Personal history of nicotine dependence: Secondary | ICD-10-CM | POA: Diagnosis not present

## 2017-06-30 DIAGNOSIS — Z794 Long term (current) use of insulin: Secondary | ICD-10-CM | POA: Diagnosis not present

## 2017-06-30 DIAGNOSIS — E1142 Type 2 diabetes mellitus with diabetic polyneuropathy: Secondary | ICD-10-CM | POA: Diagnosis not present

## 2017-06-30 DIAGNOSIS — M1991 Primary osteoarthritis, unspecified site: Secondary | ICD-10-CM | POA: Diagnosis not present

## 2017-06-30 DIAGNOSIS — E11621 Type 2 diabetes mellitus with foot ulcer: Secondary | ICD-10-CM | POA: Diagnosis not present

## 2017-06-30 DIAGNOSIS — Z7982 Long term (current) use of aspirin: Secondary | ICD-10-CM | POA: Diagnosis not present

## 2017-06-30 DIAGNOSIS — D509 Iron deficiency anemia, unspecified: Secondary | ICD-10-CM | POA: Diagnosis not present

## 2017-06-30 DIAGNOSIS — I509 Heart failure, unspecified: Secondary | ICD-10-CM | POA: Diagnosis not present

## 2017-06-30 DIAGNOSIS — M10031 Idiopathic gout, right wrist: Secondary | ICD-10-CM | POA: Diagnosis not present

## 2017-06-30 DIAGNOSIS — I110 Hypertensive heart disease with heart failure: Secondary | ICD-10-CM | POA: Diagnosis not present

## 2017-06-30 DIAGNOSIS — L97412 Non-pressure chronic ulcer of right heel and midfoot with fat layer exposed: Secondary | ICD-10-CM | POA: Diagnosis not present

## 2017-06-30 DIAGNOSIS — J449 Chronic obstructive pulmonary disease, unspecified: Secondary | ICD-10-CM | POA: Diagnosis not present

## 2017-07-02 DIAGNOSIS — M10031 Idiopathic gout, right wrist: Secondary | ICD-10-CM | POA: Diagnosis not present

## 2017-07-02 DIAGNOSIS — Z794 Long term (current) use of insulin: Secondary | ICD-10-CM | POA: Diagnosis not present

## 2017-07-02 DIAGNOSIS — D509 Iron deficiency anemia, unspecified: Secondary | ICD-10-CM | POA: Diagnosis not present

## 2017-07-02 DIAGNOSIS — E11621 Type 2 diabetes mellitus with foot ulcer: Secondary | ICD-10-CM | POA: Diagnosis not present

## 2017-07-02 DIAGNOSIS — I509 Heart failure, unspecified: Secondary | ICD-10-CM | POA: Diagnosis not present

## 2017-07-02 DIAGNOSIS — J449 Chronic obstructive pulmonary disease, unspecified: Secondary | ICD-10-CM | POA: Diagnosis not present

## 2017-07-02 DIAGNOSIS — Z87891 Personal history of nicotine dependence: Secondary | ICD-10-CM | POA: Diagnosis not present

## 2017-07-02 DIAGNOSIS — Z7982 Long term (current) use of aspirin: Secondary | ICD-10-CM | POA: Diagnosis not present

## 2017-07-02 DIAGNOSIS — E1142 Type 2 diabetes mellitus with diabetic polyneuropathy: Secondary | ICD-10-CM | POA: Diagnosis not present

## 2017-07-02 DIAGNOSIS — I110 Hypertensive heart disease with heart failure: Secondary | ICD-10-CM | POA: Diagnosis not present

## 2017-07-02 DIAGNOSIS — L97412 Non-pressure chronic ulcer of right heel and midfoot with fat layer exposed: Secondary | ICD-10-CM | POA: Diagnosis not present

## 2017-07-02 DIAGNOSIS — M1991 Primary osteoarthritis, unspecified site: Secondary | ICD-10-CM | POA: Diagnosis not present

## 2017-07-05 DIAGNOSIS — M10031 Idiopathic gout, right wrist: Secondary | ICD-10-CM | POA: Diagnosis not present

## 2017-07-05 DIAGNOSIS — D509 Iron deficiency anemia, unspecified: Secondary | ICD-10-CM | POA: Diagnosis not present

## 2017-07-05 DIAGNOSIS — E1142 Type 2 diabetes mellitus with diabetic polyneuropathy: Secondary | ICD-10-CM | POA: Diagnosis not present

## 2017-07-05 DIAGNOSIS — L97412 Non-pressure chronic ulcer of right heel and midfoot with fat layer exposed: Secondary | ICD-10-CM | POA: Diagnosis not present

## 2017-07-05 DIAGNOSIS — I110 Hypertensive heart disease with heart failure: Secondary | ICD-10-CM | POA: Diagnosis not present

## 2017-07-05 DIAGNOSIS — Z794 Long term (current) use of insulin: Secondary | ICD-10-CM | POA: Diagnosis not present

## 2017-07-05 DIAGNOSIS — J449 Chronic obstructive pulmonary disease, unspecified: Secondary | ICD-10-CM | POA: Diagnosis not present

## 2017-07-05 DIAGNOSIS — I509 Heart failure, unspecified: Secondary | ICD-10-CM | POA: Diagnosis not present

## 2017-07-05 DIAGNOSIS — E11621 Type 2 diabetes mellitus with foot ulcer: Secondary | ICD-10-CM | POA: Diagnosis not present

## 2017-07-05 DIAGNOSIS — Z87891 Personal history of nicotine dependence: Secondary | ICD-10-CM | POA: Diagnosis not present

## 2017-07-05 DIAGNOSIS — M1991 Primary osteoarthritis, unspecified site: Secondary | ICD-10-CM | POA: Diagnosis not present

## 2017-07-05 DIAGNOSIS — Z7982 Long term (current) use of aspirin: Secondary | ICD-10-CM | POA: Diagnosis not present

## 2017-07-06 DIAGNOSIS — Z7982 Long term (current) use of aspirin: Secondary | ICD-10-CM | POA: Diagnosis not present

## 2017-07-06 DIAGNOSIS — D509 Iron deficiency anemia, unspecified: Secondary | ICD-10-CM | POA: Diagnosis not present

## 2017-07-06 DIAGNOSIS — Z794 Long term (current) use of insulin: Secondary | ICD-10-CM | POA: Diagnosis not present

## 2017-07-06 DIAGNOSIS — I110 Hypertensive heart disease with heart failure: Secondary | ICD-10-CM | POA: Diagnosis not present

## 2017-07-06 DIAGNOSIS — M10031 Idiopathic gout, right wrist: Secondary | ICD-10-CM | POA: Diagnosis not present

## 2017-07-06 DIAGNOSIS — Z87891 Personal history of nicotine dependence: Secondary | ICD-10-CM | POA: Diagnosis not present

## 2017-07-06 DIAGNOSIS — L97412 Non-pressure chronic ulcer of right heel and midfoot with fat layer exposed: Secondary | ICD-10-CM | POA: Diagnosis not present

## 2017-07-06 DIAGNOSIS — M1991 Primary osteoarthritis, unspecified site: Secondary | ICD-10-CM | POA: Diagnosis not present

## 2017-07-06 DIAGNOSIS — I509 Heart failure, unspecified: Secondary | ICD-10-CM | POA: Diagnosis not present

## 2017-07-06 DIAGNOSIS — E11621 Type 2 diabetes mellitus with foot ulcer: Secondary | ICD-10-CM | POA: Diagnosis not present

## 2017-07-06 DIAGNOSIS — E1142 Type 2 diabetes mellitus with diabetic polyneuropathy: Secondary | ICD-10-CM | POA: Diagnosis not present

## 2017-07-06 DIAGNOSIS — J449 Chronic obstructive pulmonary disease, unspecified: Secondary | ICD-10-CM | POA: Diagnosis not present

## 2017-07-09 DIAGNOSIS — E1142 Type 2 diabetes mellitus with diabetic polyneuropathy: Secondary | ICD-10-CM | POA: Diagnosis not present

## 2017-07-09 DIAGNOSIS — M1991 Primary osteoarthritis, unspecified site: Secondary | ICD-10-CM | POA: Diagnosis not present

## 2017-07-09 DIAGNOSIS — J449 Chronic obstructive pulmonary disease, unspecified: Secondary | ICD-10-CM | POA: Diagnosis not present

## 2017-07-09 DIAGNOSIS — Z794 Long term (current) use of insulin: Secondary | ICD-10-CM | POA: Diagnosis not present

## 2017-07-09 DIAGNOSIS — L97412 Non-pressure chronic ulcer of right heel and midfoot with fat layer exposed: Secondary | ICD-10-CM | POA: Diagnosis not present

## 2017-07-09 DIAGNOSIS — Z7982 Long term (current) use of aspirin: Secondary | ICD-10-CM | POA: Diagnosis not present

## 2017-07-09 DIAGNOSIS — I509 Heart failure, unspecified: Secondary | ICD-10-CM | POA: Diagnosis not present

## 2017-07-09 DIAGNOSIS — Z87891 Personal history of nicotine dependence: Secondary | ICD-10-CM | POA: Diagnosis not present

## 2017-07-09 DIAGNOSIS — I110 Hypertensive heart disease with heart failure: Secondary | ICD-10-CM | POA: Diagnosis not present

## 2017-07-09 DIAGNOSIS — D509 Iron deficiency anemia, unspecified: Secondary | ICD-10-CM | POA: Diagnosis not present

## 2017-07-09 DIAGNOSIS — M10031 Idiopathic gout, right wrist: Secondary | ICD-10-CM | POA: Diagnosis not present

## 2017-07-09 DIAGNOSIS — E11621 Type 2 diabetes mellitus with foot ulcer: Secondary | ICD-10-CM | POA: Diagnosis not present

## 2017-07-12 DIAGNOSIS — L97412 Non-pressure chronic ulcer of right heel and midfoot with fat layer exposed: Secondary | ICD-10-CM | POA: Diagnosis not present

## 2017-07-12 DIAGNOSIS — D509 Iron deficiency anemia, unspecified: Secondary | ICD-10-CM | POA: Diagnosis not present

## 2017-07-12 DIAGNOSIS — Z87891 Personal history of nicotine dependence: Secondary | ICD-10-CM | POA: Diagnosis not present

## 2017-07-12 DIAGNOSIS — M1991 Primary osteoarthritis, unspecified site: Secondary | ICD-10-CM | POA: Diagnosis not present

## 2017-07-12 DIAGNOSIS — J449 Chronic obstructive pulmonary disease, unspecified: Secondary | ICD-10-CM | POA: Diagnosis not present

## 2017-07-12 DIAGNOSIS — E11621 Type 2 diabetes mellitus with foot ulcer: Secondary | ICD-10-CM | POA: Diagnosis not present

## 2017-07-12 DIAGNOSIS — Z7982 Long term (current) use of aspirin: Secondary | ICD-10-CM | POA: Diagnosis not present

## 2017-07-12 DIAGNOSIS — E1142 Type 2 diabetes mellitus with diabetic polyneuropathy: Secondary | ICD-10-CM | POA: Diagnosis not present

## 2017-07-12 DIAGNOSIS — M10031 Idiopathic gout, right wrist: Secondary | ICD-10-CM | POA: Diagnosis not present

## 2017-07-12 DIAGNOSIS — I110 Hypertensive heart disease with heart failure: Secondary | ICD-10-CM | POA: Diagnosis not present

## 2017-07-12 DIAGNOSIS — I509 Heart failure, unspecified: Secondary | ICD-10-CM | POA: Diagnosis not present

## 2017-07-12 DIAGNOSIS — Z794 Long term (current) use of insulin: Secondary | ICD-10-CM | POA: Diagnosis not present

## 2017-07-14 DIAGNOSIS — J449 Chronic obstructive pulmonary disease, unspecified: Secondary | ICD-10-CM | POA: Diagnosis not present

## 2017-07-14 DIAGNOSIS — I110 Hypertensive heart disease with heart failure: Secondary | ICD-10-CM | POA: Diagnosis not present

## 2017-07-14 DIAGNOSIS — Z794 Long term (current) use of insulin: Secondary | ICD-10-CM | POA: Diagnosis not present

## 2017-07-14 DIAGNOSIS — E1343 Other specified diabetes mellitus with diabetic autonomic (poly)neuropathy: Secondary | ICD-10-CM | POA: Diagnosis not present

## 2017-07-14 DIAGNOSIS — E1142 Type 2 diabetes mellitus with diabetic polyneuropathy: Secondary | ICD-10-CM | POA: Diagnosis not present

## 2017-07-14 DIAGNOSIS — Z7982 Long term (current) use of aspirin: Secondary | ICD-10-CM | POA: Diagnosis not present

## 2017-07-14 DIAGNOSIS — Z1331 Encounter for screening for depression: Secondary | ICD-10-CM | POA: Diagnosis not present

## 2017-07-14 DIAGNOSIS — M10031 Idiopathic gout, right wrist: Secondary | ICD-10-CM | POA: Diagnosis not present

## 2017-07-14 DIAGNOSIS — Z9181 History of falling: Secondary | ICD-10-CM | POA: Diagnosis not present

## 2017-07-14 DIAGNOSIS — M1991 Primary osteoarthritis, unspecified site: Secondary | ICD-10-CM | POA: Diagnosis not present

## 2017-07-14 DIAGNOSIS — E119 Type 2 diabetes mellitus without complications: Secondary | ICD-10-CM | POA: Diagnosis not present

## 2017-07-14 DIAGNOSIS — E11621 Type 2 diabetes mellitus with foot ulcer: Secondary | ICD-10-CM | POA: Diagnosis not present

## 2017-07-14 DIAGNOSIS — I509 Heart failure, unspecified: Secondary | ICD-10-CM | POA: Diagnosis not present

## 2017-07-14 DIAGNOSIS — D509 Iron deficiency anemia, unspecified: Secondary | ICD-10-CM | POA: Diagnosis not present

## 2017-07-14 DIAGNOSIS — E785 Hyperlipidemia, unspecified: Secondary | ICD-10-CM | POA: Diagnosis not present

## 2017-07-14 DIAGNOSIS — L97412 Non-pressure chronic ulcer of right heel and midfoot with fat layer exposed: Secondary | ICD-10-CM | POA: Diagnosis not present

## 2017-07-14 DIAGNOSIS — Z87891 Personal history of nicotine dependence: Secondary | ICD-10-CM | POA: Diagnosis not present

## 2017-07-16 DIAGNOSIS — Z7982 Long term (current) use of aspirin: Secondary | ICD-10-CM | POA: Diagnosis not present

## 2017-07-16 DIAGNOSIS — D509 Iron deficiency anemia, unspecified: Secondary | ICD-10-CM | POA: Diagnosis not present

## 2017-07-16 DIAGNOSIS — M10031 Idiopathic gout, right wrist: Secondary | ICD-10-CM | POA: Diagnosis not present

## 2017-07-16 DIAGNOSIS — I509 Heart failure, unspecified: Secondary | ICD-10-CM | POA: Diagnosis not present

## 2017-07-16 DIAGNOSIS — E1142 Type 2 diabetes mellitus with diabetic polyneuropathy: Secondary | ICD-10-CM | POA: Diagnosis not present

## 2017-07-16 DIAGNOSIS — I110 Hypertensive heart disease with heart failure: Secondary | ICD-10-CM | POA: Diagnosis not present

## 2017-07-16 DIAGNOSIS — E11621 Type 2 diabetes mellitus with foot ulcer: Secondary | ICD-10-CM | POA: Diagnosis not present

## 2017-07-16 DIAGNOSIS — Z87891 Personal history of nicotine dependence: Secondary | ICD-10-CM | POA: Diagnosis not present

## 2017-07-16 DIAGNOSIS — L97412 Non-pressure chronic ulcer of right heel and midfoot with fat layer exposed: Secondary | ICD-10-CM | POA: Diagnosis not present

## 2017-07-16 DIAGNOSIS — M1991 Primary osteoarthritis, unspecified site: Secondary | ICD-10-CM | POA: Diagnosis not present

## 2017-07-16 DIAGNOSIS — J449 Chronic obstructive pulmonary disease, unspecified: Secondary | ICD-10-CM | POA: Diagnosis not present

## 2017-07-16 DIAGNOSIS — Z794 Long term (current) use of insulin: Secondary | ICD-10-CM | POA: Diagnosis not present

## 2017-07-19 DIAGNOSIS — Z87891 Personal history of nicotine dependence: Secondary | ICD-10-CM | POA: Diagnosis not present

## 2017-07-19 DIAGNOSIS — M10031 Idiopathic gout, right wrist: Secondary | ICD-10-CM | POA: Diagnosis not present

## 2017-07-19 DIAGNOSIS — J449 Chronic obstructive pulmonary disease, unspecified: Secondary | ICD-10-CM | POA: Diagnosis not present

## 2017-07-19 DIAGNOSIS — D509 Iron deficiency anemia, unspecified: Secondary | ICD-10-CM | POA: Diagnosis not present

## 2017-07-19 DIAGNOSIS — I509 Heart failure, unspecified: Secondary | ICD-10-CM | POA: Diagnosis not present

## 2017-07-19 DIAGNOSIS — M1991 Primary osteoarthritis, unspecified site: Secondary | ICD-10-CM | POA: Diagnosis not present

## 2017-07-19 DIAGNOSIS — E11621 Type 2 diabetes mellitus with foot ulcer: Secondary | ICD-10-CM | POA: Diagnosis not present

## 2017-07-19 DIAGNOSIS — L97412 Non-pressure chronic ulcer of right heel and midfoot with fat layer exposed: Secondary | ICD-10-CM | POA: Diagnosis not present

## 2017-07-19 DIAGNOSIS — E1142 Type 2 diabetes mellitus with diabetic polyneuropathy: Secondary | ICD-10-CM | POA: Diagnosis not present

## 2017-07-19 DIAGNOSIS — Z7982 Long term (current) use of aspirin: Secondary | ICD-10-CM | POA: Diagnosis not present

## 2017-07-19 DIAGNOSIS — I110 Hypertensive heart disease with heart failure: Secondary | ICD-10-CM | POA: Diagnosis not present

## 2017-07-19 DIAGNOSIS — Z794 Long term (current) use of insulin: Secondary | ICD-10-CM | POA: Diagnosis not present

## 2017-07-21 DIAGNOSIS — Z7982 Long term (current) use of aspirin: Secondary | ICD-10-CM | POA: Diagnosis not present

## 2017-07-21 DIAGNOSIS — L97412 Non-pressure chronic ulcer of right heel and midfoot with fat layer exposed: Secondary | ICD-10-CM | POA: Diagnosis not present

## 2017-07-21 DIAGNOSIS — Z87891 Personal history of nicotine dependence: Secondary | ICD-10-CM | POA: Diagnosis not present

## 2017-07-21 DIAGNOSIS — E1142 Type 2 diabetes mellitus with diabetic polyneuropathy: Secondary | ICD-10-CM | POA: Diagnosis not present

## 2017-07-21 DIAGNOSIS — J449 Chronic obstructive pulmonary disease, unspecified: Secondary | ICD-10-CM | POA: Diagnosis not present

## 2017-07-21 DIAGNOSIS — I509 Heart failure, unspecified: Secondary | ICD-10-CM | POA: Diagnosis not present

## 2017-07-21 DIAGNOSIS — M1991 Primary osteoarthritis, unspecified site: Secondary | ICD-10-CM | POA: Diagnosis not present

## 2017-07-21 DIAGNOSIS — Z794 Long term (current) use of insulin: Secondary | ICD-10-CM | POA: Diagnosis not present

## 2017-07-21 DIAGNOSIS — D509 Iron deficiency anemia, unspecified: Secondary | ICD-10-CM | POA: Diagnosis not present

## 2017-07-21 DIAGNOSIS — I110 Hypertensive heart disease with heart failure: Secondary | ICD-10-CM | POA: Diagnosis not present

## 2017-07-21 DIAGNOSIS — E11621 Type 2 diabetes mellitus with foot ulcer: Secondary | ICD-10-CM | POA: Diagnosis not present

## 2017-07-21 DIAGNOSIS — M10031 Idiopathic gout, right wrist: Secondary | ICD-10-CM | POA: Diagnosis not present

## 2017-07-22 DIAGNOSIS — Z87891 Personal history of nicotine dependence: Secondary | ICD-10-CM | POA: Diagnosis not present

## 2017-07-22 DIAGNOSIS — E1142 Type 2 diabetes mellitus with diabetic polyneuropathy: Secondary | ICD-10-CM | POA: Diagnosis not present

## 2017-07-22 DIAGNOSIS — Z794 Long term (current) use of insulin: Secondary | ICD-10-CM | POA: Diagnosis not present

## 2017-07-22 DIAGNOSIS — E11621 Type 2 diabetes mellitus with foot ulcer: Secondary | ICD-10-CM | POA: Diagnosis not present

## 2017-07-22 DIAGNOSIS — J449 Chronic obstructive pulmonary disease, unspecified: Secondary | ICD-10-CM | POA: Diagnosis not present

## 2017-07-22 DIAGNOSIS — Z7982 Long term (current) use of aspirin: Secondary | ICD-10-CM | POA: Diagnosis not present

## 2017-07-22 DIAGNOSIS — D509 Iron deficiency anemia, unspecified: Secondary | ICD-10-CM | POA: Diagnosis not present

## 2017-07-22 DIAGNOSIS — I110 Hypertensive heart disease with heart failure: Secondary | ICD-10-CM | POA: Diagnosis not present

## 2017-07-22 DIAGNOSIS — M10031 Idiopathic gout, right wrist: Secondary | ICD-10-CM | POA: Diagnosis not present

## 2017-07-22 DIAGNOSIS — M1991 Primary osteoarthritis, unspecified site: Secondary | ICD-10-CM | POA: Diagnosis not present

## 2017-07-22 DIAGNOSIS — L97412 Non-pressure chronic ulcer of right heel and midfoot with fat layer exposed: Secondary | ICD-10-CM | POA: Diagnosis not present

## 2017-07-22 DIAGNOSIS — I509 Heart failure, unspecified: Secondary | ICD-10-CM | POA: Diagnosis not present

## 2017-07-23 DIAGNOSIS — E11621 Type 2 diabetes mellitus with foot ulcer: Secondary | ICD-10-CM | POA: Diagnosis not present

## 2017-07-23 DIAGNOSIS — Z794 Long term (current) use of insulin: Secondary | ICD-10-CM | POA: Diagnosis not present

## 2017-07-23 DIAGNOSIS — I110 Hypertensive heart disease with heart failure: Secondary | ICD-10-CM | POA: Diagnosis not present

## 2017-07-23 DIAGNOSIS — M1991 Primary osteoarthritis, unspecified site: Secondary | ICD-10-CM | POA: Diagnosis not present

## 2017-07-23 DIAGNOSIS — Z87891 Personal history of nicotine dependence: Secondary | ICD-10-CM | POA: Diagnosis not present

## 2017-07-23 DIAGNOSIS — J449 Chronic obstructive pulmonary disease, unspecified: Secondary | ICD-10-CM | POA: Diagnosis not present

## 2017-07-23 DIAGNOSIS — L97412 Non-pressure chronic ulcer of right heel and midfoot with fat layer exposed: Secondary | ICD-10-CM | POA: Diagnosis not present

## 2017-07-23 DIAGNOSIS — E1142 Type 2 diabetes mellitus with diabetic polyneuropathy: Secondary | ICD-10-CM | POA: Diagnosis not present

## 2017-07-23 DIAGNOSIS — I509 Heart failure, unspecified: Secondary | ICD-10-CM | POA: Diagnosis not present

## 2017-07-23 DIAGNOSIS — M10031 Idiopathic gout, right wrist: Secondary | ICD-10-CM | POA: Diagnosis not present

## 2017-07-23 DIAGNOSIS — Z7982 Long term (current) use of aspirin: Secondary | ICD-10-CM | POA: Diagnosis not present

## 2017-07-23 DIAGNOSIS — D509 Iron deficiency anemia, unspecified: Secondary | ICD-10-CM | POA: Diagnosis not present

## 2017-07-26 DIAGNOSIS — Z87891 Personal history of nicotine dependence: Secondary | ICD-10-CM | POA: Diagnosis not present

## 2017-07-26 DIAGNOSIS — E11621 Type 2 diabetes mellitus with foot ulcer: Secondary | ICD-10-CM | POA: Diagnosis not present

## 2017-07-26 DIAGNOSIS — L97412 Non-pressure chronic ulcer of right heel and midfoot with fat layer exposed: Secondary | ICD-10-CM | POA: Diagnosis not present

## 2017-07-26 DIAGNOSIS — M1991 Primary osteoarthritis, unspecified site: Secondary | ICD-10-CM | POA: Diagnosis not present

## 2017-07-26 DIAGNOSIS — E1142 Type 2 diabetes mellitus with diabetic polyneuropathy: Secondary | ICD-10-CM | POA: Diagnosis not present

## 2017-07-26 DIAGNOSIS — Z7982 Long term (current) use of aspirin: Secondary | ICD-10-CM | POA: Diagnosis not present

## 2017-07-26 DIAGNOSIS — Z794 Long term (current) use of insulin: Secondary | ICD-10-CM | POA: Diagnosis not present

## 2017-07-26 DIAGNOSIS — M10031 Idiopathic gout, right wrist: Secondary | ICD-10-CM | POA: Diagnosis not present

## 2017-07-26 DIAGNOSIS — D509 Iron deficiency anemia, unspecified: Secondary | ICD-10-CM | POA: Diagnosis not present

## 2017-07-26 DIAGNOSIS — I509 Heart failure, unspecified: Secondary | ICD-10-CM | POA: Diagnosis not present

## 2017-07-26 DIAGNOSIS — I110 Hypertensive heart disease with heart failure: Secondary | ICD-10-CM | POA: Diagnosis not present

## 2017-07-26 DIAGNOSIS — J449 Chronic obstructive pulmonary disease, unspecified: Secondary | ICD-10-CM | POA: Diagnosis not present

## 2017-07-28 DIAGNOSIS — I110 Hypertensive heart disease with heart failure: Secondary | ICD-10-CM | POA: Diagnosis not present

## 2017-07-28 DIAGNOSIS — D509 Iron deficiency anemia, unspecified: Secondary | ICD-10-CM | POA: Diagnosis not present

## 2017-07-28 DIAGNOSIS — M10031 Idiopathic gout, right wrist: Secondary | ICD-10-CM | POA: Diagnosis not present

## 2017-07-28 DIAGNOSIS — Z87891 Personal history of nicotine dependence: Secondary | ICD-10-CM | POA: Diagnosis not present

## 2017-07-28 DIAGNOSIS — M1991 Primary osteoarthritis, unspecified site: Secondary | ICD-10-CM | POA: Diagnosis not present

## 2017-07-28 DIAGNOSIS — I509 Heart failure, unspecified: Secondary | ICD-10-CM | POA: Diagnosis not present

## 2017-07-28 DIAGNOSIS — Z794 Long term (current) use of insulin: Secondary | ICD-10-CM | POA: Diagnosis not present

## 2017-07-28 DIAGNOSIS — J449 Chronic obstructive pulmonary disease, unspecified: Secondary | ICD-10-CM | POA: Diagnosis not present

## 2017-07-28 DIAGNOSIS — E1142 Type 2 diabetes mellitus with diabetic polyneuropathy: Secondary | ICD-10-CM | POA: Diagnosis not present

## 2017-07-28 DIAGNOSIS — L97412 Non-pressure chronic ulcer of right heel and midfoot with fat layer exposed: Secondary | ICD-10-CM | POA: Diagnosis not present

## 2017-07-28 DIAGNOSIS — Z7982 Long term (current) use of aspirin: Secondary | ICD-10-CM | POA: Diagnosis not present

## 2017-07-28 DIAGNOSIS — E11621 Type 2 diabetes mellitus with foot ulcer: Secondary | ICD-10-CM | POA: Diagnosis not present

## 2017-07-30 DIAGNOSIS — E11621 Type 2 diabetes mellitus with foot ulcer: Secondary | ICD-10-CM | POA: Diagnosis not present

## 2017-07-30 DIAGNOSIS — M10031 Idiopathic gout, right wrist: Secondary | ICD-10-CM | POA: Diagnosis not present

## 2017-07-30 DIAGNOSIS — I110 Hypertensive heart disease with heart failure: Secondary | ICD-10-CM | POA: Diagnosis not present

## 2017-07-30 DIAGNOSIS — Z7982 Long term (current) use of aspirin: Secondary | ICD-10-CM | POA: Diagnosis not present

## 2017-07-30 DIAGNOSIS — L97412 Non-pressure chronic ulcer of right heel and midfoot with fat layer exposed: Secondary | ICD-10-CM | POA: Diagnosis not present

## 2017-07-30 DIAGNOSIS — J449 Chronic obstructive pulmonary disease, unspecified: Secondary | ICD-10-CM | POA: Diagnosis not present

## 2017-07-30 DIAGNOSIS — I509 Heart failure, unspecified: Secondary | ICD-10-CM | POA: Diagnosis not present

## 2017-07-30 DIAGNOSIS — M1991 Primary osteoarthritis, unspecified site: Secondary | ICD-10-CM | POA: Diagnosis not present

## 2017-07-30 DIAGNOSIS — Z794 Long term (current) use of insulin: Secondary | ICD-10-CM | POA: Diagnosis not present

## 2017-07-30 DIAGNOSIS — E1142 Type 2 diabetes mellitus with diabetic polyneuropathy: Secondary | ICD-10-CM | POA: Diagnosis not present

## 2017-07-30 DIAGNOSIS — D509 Iron deficiency anemia, unspecified: Secondary | ICD-10-CM | POA: Diagnosis not present

## 2017-07-30 DIAGNOSIS — Z87891 Personal history of nicotine dependence: Secondary | ICD-10-CM | POA: Diagnosis not present

## 2017-08-02 DIAGNOSIS — L97412 Non-pressure chronic ulcer of right heel and midfoot with fat layer exposed: Secondary | ICD-10-CM | POA: Diagnosis not present

## 2017-08-02 DIAGNOSIS — Z7982 Long term (current) use of aspirin: Secondary | ICD-10-CM | POA: Diagnosis not present

## 2017-08-02 DIAGNOSIS — J449 Chronic obstructive pulmonary disease, unspecified: Secondary | ICD-10-CM | POA: Diagnosis not present

## 2017-08-02 DIAGNOSIS — I509 Heart failure, unspecified: Secondary | ICD-10-CM | POA: Diagnosis not present

## 2017-08-02 DIAGNOSIS — M1991 Primary osteoarthritis, unspecified site: Secondary | ICD-10-CM | POA: Diagnosis not present

## 2017-08-02 DIAGNOSIS — M10031 Idiopathic gout, right wrist: Secondary | ICD-10-CM | POA: Diagnosis not present

## 2017-08-02 DIAGNOSIS — Z87891 Personal history of nicotine dependence: Secondary | ICD-10-CM | POA: Diagnosis not present

## 2017-08-02 DIAGNOSIS — I110 Hypertensive heart disease with heart failure: Secondary | ICD-10-CM | POA: Diagnosis not present

## 2017-08-02 DIAGNOSIS — E1142 Type 2 diabetes mellitus with diabetic polyneuropathy: Secondary | ICD-10-CM | POA: Diagnosis not present

## 2017-08-02 DIAGNOSIS — E11621 Type 2 diabetes mellitus with foot ulcer: Secondary | ICD-10-CM | POA: Diagnosis not present

## 2017-08-02 DIAGNOSIS — Z794 Long term (current) use of insulin: Secondary | ICD-10-CM | POA: Diagnosis not present

## 2017-08-02 DIAGNOSIS — D509 Iron deficiency anemia, unspecified: Secondary | ICD-10-CM | POA: Diagnosis not present

## 2017-08-04 DIAGNOSIS — I509 Heart failure, unspecified: Secondary | ICD-10-CM | POA: Diagnosis not present

## 2017-08-04 DIAGNOSIS — E11621 Type 2 diabetes mellitus with foot ulcer: Secondary | ICD-10-CM | POA: Diagnosis not present

## 2017-08-04 DIAGNOSIS — D509 Iron deficiency anemia, unspecified: Secondary | ICD-10-CM | POA: Diagnosis not present

## 2017-08-04 DIAGNOSIS — Z7982 Long term (current) use of aspirin: Secondary | ICD-10-CM | POA: Diagnosis not present

## 2017-08-04 DIAGNOSIS — Z87891 Personal history of nicotine dependence: Secondary | ICD-10-CM | POA: Diagnosis not present

## 2017-08-04 DIAGNOSIS — J449 Chronic obstructive pulmonary disease, unspecified: Secondary | ICD-10-CM | POA: Diagnosis not present

## 2017-08-04 DIAGNOSIS — E1142 Type 2 diabetes mellitus with diabetic polyneuropathy: Secondary | ICD-10-CM | POA: Diagnosis not present

## 2017-08-04 DIAGNOSIS — I110 Hypertensive heart disease with heart failure: Secondary | ICD-10-CM | POA: Diagnosis not present

## 2017-08-04 DIAGNOSIS — M1991 Primary osteoarthritis, unspecified site: Secondary | ICD-10-CM | POA: Diagnosis not present

## 2017-08-04 DIAGNOSIS — L97412 Non-pressure chronic ulcer of right heel and midfoot with fat layer exposed: Secondary | ICD-10-CM | POA: Diagnosis not present

## 2017-08-04 DIAGNOSIS — M10031 Idiopathic gout, right wrist: Secondary | ICD-10-CM | POA: Diagnosis not present

## 2017-08-04 DIAGNOSIS — Z794 Long term (current) use of insulin: Secondary | ICD-10-CM | POA: Diagnosis not present

## 2017-08-05 DIAGNOSIS — E1140 Type 2 diabetes mellitus with diabetic neuropathy, unspecified: Secondary | ICD-10-CM | POA: Diagnosis not present

## 2017-08-05 DIAGNOSIS — J449 Chronic obstructive pulmonary disease, unspecified: Secondary | ICD-10-CM | POA: Diagnosis not present

## 2017-08-05 DIAGNOSIS — L97412 Non-pressure chronic ulcer of right heel and midfoot with fat layer exposed: Secondary | ICD-10-CM | POA: Diagnosis not present

## 2017-08-05 DIAGNOSIS — E11621 Type 2 diabetes mellitus with foot ulcer: Secondary | ICD-10-CM | POA: Diagnosis not present

## 2017-08-09 DIAGNOSIS — D509 Iron deficiency anemia, unspecified: Secondary | ICD-10-CM | POA: Diagnosis not present

## 2017-08-09 DIAGNOSIS — I509 Heart failure, unspecified: Secondary | ICD-10-CM | POA: Diagnosis not present

## 2017-08-09 DIAGNOSIS — E1142 Type 2 diabetes mellitus with diabetic polyneuropathy: Secondary | ICD-10-CM | POA: Diagnosis not present

## 2017-08-09 DIAGNOSIS — I110 Hypertensive heart disease with heart failure: Secondary | ICD-10-CM | POA: Diagnosis not present

## 2017-08-09 DIAGNOSIS — Z7982 Long term (current) use of aspirin: Secondary | ICD-10-CM | POA: Diagnosis not present

## 2017-08-09 DIAGNOSIS — M1991 Primary osteoarthritis, unspecified site: Secondary | ICD-10-CM | POA: Diagnosis not present

## 2017-08-09 DIAGNOSIS — Z87891 Personal history of nicotine dependence: Secondary | ICD-10-CM | POA: Diagnosis not present

## 2017-08-09 DIAGNOSIS — L97412 Non-pressure chronic ulcer of right heel and midfoot with fat layer exposed: Secondary | ICD-10-CM | POA: Diagnosis not present

## 2017-08-09 DIAGNOSIS — E11621 Type 2 diabetes mellitus with foot ulcer: Secondary | ICD-10-CM | POA: Diagnosis not present

## 2017-08-09 DIAGNOSIS — M10031 Idiopathic gout, right wrist: Secondary | ICD-10-CM | POA: Diagnosis not present

## 2017-08-09 DIAGNOSIS — J449 Chronic obstructive pulmonary disease, unspecified: Secondary | ICD-10-CM | POA: Diagnosis not present

## 2017-08-09 DIAGNOSIS — Z794 Long term (current) use of insulin: Secondary | ICD-10-CM | POA: Diagnosis not present

## 2017-08-11 DIAGNOSIS — I509 Heart failure, unspecified: Secondary | ICD-10-CM | POA: Diagnosis not present

## 2017-08-11 DIAGNOSIS — Z794 Long term (current) use of insulin: Secondary | ICD-10-CM | POA: Diagnosis not present

## 2017-08-11 DIAGNOSIS — J449 Chronic obstructive pulmonary disease, unspecified: Secondary | ICD-10-CM | POA: Diagnosis not present

## 2017-08-11 DIAGNOSIS — D509 Iron deficiency anemia, unspecified: Secondary | ICD-10-CM | POA: Diagnosis not present

## 2017-08-11 DIAGNOSIS — E785 Hyperlipidemia, unspecified: Secondary | ICD-10-CM | POA: Diagnosis not present

## 2017-08-11 DIAGNOSIS — L97412 Non-pressure chronic ulcer of right heel and midfoot with fat layer exposed: Secondary | ICD-10-CM | POA: Diagnosis not present

## 2017-08-11 DIAGNOSIS — M1991 Primary osteoarthritis, unspecified site: Secondary | ICD-10-CM | POA: Diagnosis not present

## 2017-08-11 DIAGNOSIS — M10031 Idiopathic gout, right wrist: Secondary | ICD-10-CM | POA: Diagnosis not present

## 2017-08-11 DIAGNOSIS — I110 Hypertensive heart disease with heart failure: Secondary | ICD-10-CM | POA: Diagnosis not present

## 2017-08-11 DIAGNOSIS — E1343 Other specified diabetes mellitus with diabetic autonomic (poly)neuropathy: Secondary | ICD-10-CM | POA: Diagnosis not present

## 2017-08-11 DIAGNOSIS — Z7982 Long term (current) use of aspirin: Secondary | ICD-10-CM | POA: Diagnosis not present

## 2017-08-11 DIAGNOSIS — I10 Essential (primary) hypertension: Secondary | ICD-10-CM | POA: Diagnosis not present

## 2017-08-11 DIAGNOSIS — Z87891 Personal history of nicotine dependence: Secondary | ICD-10-CM | POA: Diagnosis not present

## 2017-08-11 DIAGNOSIS — E11621 Type 2 diabetes mellitus with foot ulcer: Secondary | ICD-10-CM | POA: Diagnosis not present

## 2017-08-11 DIAGNOSIS — E119 Type 2 diabetes mellitus without complications: Secondary | ICD-10-CM | POA: Diagnosis not present

## 2017-08-11 DIAGNOSIS — E1142 Type 2 diabetes mellitus with diabetic polyneuropathy: Secondary | ICD-10-CM | POA: Diagnosis not present

## 2017-08-13 DIAGNOSIS — I110 Hypertensive heart disease with heart failure: Secondary | ICD-10-CM | POA: Diagnosis not present

## 2017-08-13 DIAGNOSIS — Z7982 Long term (current) use of aspirin: Secondary | ICD-10-CM | POA: Diagnosis not present

## 2017-08-13 DIAGNOSIS — E1142 Type 2 diabetes mellitus with diabetic polyneuropathy: Secondary | ICD-10-CM | POA: Diagnosis not present

## 2017-08-13 DIAGNOSIS — M10031 Idiopathic gout, right wrist: Secondary | ICD-10-CM | POA: Diagnosis not present

## 2017-08-13 DIAGNOSIS — L97412 Non-pressure chronic ulcer of right heel and midfoot with fat layer exposed: Secondary | ICD-10-CM | POA: Diagnosis not present

## 2017-08-13 DIAGNOSIS — J449 Chronic obstructive pulmonary disease, unspecified: Secondary | ICD-10-CM | POA: Diagnosis not present

## 2017-08-13 DIAGNOSIS — Z87891 Personal history of nicotine dependence: Secondary | ICD-10-CM | POA: Diagnosis not present

## 2017-08-13 DIAGNOSIS — I509 Heart failure, unspecified: Secondary | ICD-10-CM | POA: Diagnosis not present

## 2017-08-13 DIAGNOSIS — E11621 Type 2 diabetes mellitus with foot ulcer: Secondary | ICD-10-CM | POA: Diagnosis not present

## 2017-08-13 DIAGNOSIS — M1991 Primary osteoarthritis, unspecified site: Secondary | ICD-10-CM | POA: Diagnosis not present

## 2017-08-13 DIAGNOSIS — Z794 Long term (current) use of insulin: Secondary | ICD-10-CM | POA: Diagnosis not present

## 2017-08-13 DIAGNOSIS — D509 Iron deficiency anemia, unspecified: Secondary | ICD-10-CM | POA: Diagnosis not present

## 2017-08-16 DIAGNOSIS — E1142 Type 2 diabetes mellitus with diabetic polyneuropathy: Secondary | ICD-10-CM | POA: Diagnosis not present

## 2017-08-16 DIAGNOSIS — M10031 Idiopathic gout, right wrist: Secondary | ICD-10-CM | POA: Diagnosis not present

## 2017-08-16 DIAGNOSIS — D509 Iron deficiency anemia, unspecified: Secondary | ICD-10-CM | POA: Diagnosis not present

## 2017-08-16 DIAGNOSIS — J449 Chronic obstructive pulmonary disease, unspecified: Secondary | ICD-10-CM | POA: Diagnosis not present

## 2017-08-16 DIAGNOSIS — Z7982 Long term (current) use of aspirin: Secondary | ICD-10-CM | POA: Diagnosis not present

## 2017-08-16 DIAGNOSIS — M1991 Primary osteoarthritis, unspecified site: Secondary | ICD-10-CM | POA: Diagnosis not present

## 2017-08-16 DIAGNOSIS — I509 Heart failure, unspecified: Secondary | ICD-10-CM | POA: Diagnosis not present

## 2017-08-16 DIAGNOSIS — Z794 Long term (current) use of insulin: Secondary | ICD-10-CM | POA: Diagnosis not present

## 2017-08-16 DIAGNOSIS — L97412 Non-pressure chronic ulcer of right heel and midfoot with fat layer exposed: Secondary | ICD-10-CM | POA: Diagnosis not present

## 2017-08-16 DIAGNOSIS — E11621 Type 2 diabetes mellitus with foot ulcer: Secondary | ICD-10-CM | POA: Diagnosis not present

## 2017-08-16 DIAGNOSIS — Z87891 Personal history of nicotine dependence: Secondary | ICD-10-CM | POA: Diagnosis not present

## 2017-08-16 DIAGNOSIS — I110 Hypertensive heart disease with heart failure: Secondary | ICD-10-CM | POA: Diagnosis not present

## 2017-08-18 DIAGNOSIS — M1991 Primary osteoarthritis, unspecified site: Secondary | ICD-10-CM | POA: Diagnosis not present

## 2017-08-18 DIAGNOSIS — M10031 Idiopathic gout, right wrist: Secondary | ICD-10-CM | POA: Diagnosis not present

## 2017-08-18 DIAGNOSIS — E11621 Type 2 diabetes mellitus with foot ulcer: Secondary | ICD-10-CM | POA: Diagnosis not present

## 2017-08-18 DIAGNOSIS — I110 Hypertensive heart disease with heart failure: Secondary | ICD-10-CM | POA: Diagnosis not present

## 2017-08-18 DIAGNOSIS — D509 Iron deficiency anemia, unspecified: Secondary | ICD-10-CM | POA: Diagnosis not present

## 2017-08-18 DIAGNOSIS — J449 Chronic obstructive pulmonary disease, unspecified: Secondary | ICD-10-CM | POA: Diagnosis not present

## 2017-08-18 DIAGNOSIS — Z87891 Personal history of nicotine dependence: Secondary | ICD-10-CM | POA: Diagnosis not present

## 2017-08-18 DIAGNOSIS — E1142 Type 2 diabetes mellitus with diabetic polyneuropathy: Secondary | ICD-10-CM | POA: Diagnosis not present

## 2017-08-18 DIAGNOSIS — I509 Heart failure, unspecified: Secondary | ICD-10-CM | POA: Diagnosis not present

## 2017-08-18 DIAGNOSIS — L97412 Non-pressure chronic ulcer of right heel and midfoot with fat layer exposed: Secondary | ICD-10-CM | POA: Diagnosis not present

## 2017-08-18 DIAGNOSIS — Z794 Long term (current) use of insulin: Secondary | ICD-10-CM | POA: Diagnosis not present

## 2017-08-18 DIAGNOSIS — Z7982 Long term (current) use of aspirin: Secondary | ICD-10-CM | POA: Diagnosis not present

## 2017-08-20 DIAGNOSIS — D509 Iron deficiency anemia, unspecified: Secondary | ICD-10-CM | POA: Diagnosis not present

## 2017-08-20 DIAGNOSIS — Z794 Long term (current) use of insulin: Secondary | ICD-10-CM | POA: Diagnosis not present

## 2017-08-20 DIAGNOSIS — Z7982 Long term (current) use of aspirin: Secondary | ICD-10-CM | POA: Diagnosis not present

## 2017-08-20 DIAGNOSIS — M10031 Idiopathic gout, right wrist: Secondary | ICD-10-CM | POA: Diagnosis not present

## 2017-08-20 DIAGNOSIS — I509 Heart failure, unspecified: Secondary | ICD-10-CM | POA: Diagnosis not present

## 2017-08-20 DIAGNOSIS — E1142 Type 2 diabetes mellitus with diabetic polyneuropathy: Secondary | ICD-10-CM | POA: Diagnosis not present

## 2017-08-20 DIAGNOSIS — Z87891 Personal history of nicotine dependence: Secondary | ICD-10-CM | POA: Diagnosis not present

## 2017-08-20 DIAGNOSIS — E11621 Type 2 diabetes mellitus with foot ulcer: Secondary | ICD-10-CM | POA: Diagnosis not present

## 2017-08-20 DIAGNOSIS — I110 Hypertensive heart disease with heart failure: Secondary | ICD-10-CM | POA: Diagnosis not present

## 2017-08-20 DIAGNOSIS — M1991 Primary osteoarthritis, unspecified site: Secondary | ICD-10-CM | POA: Diagnosis not present

## 2017-08-20 DIAGNOSIS — J449 Chronic obstructive pulmonary disease, unspecified: Secondary | ICD-10-CM | POA: Diagnosis not present

## 2017-08-20 DIAGNOSIS — L97412 Non-pressure chronic ulcer of right heel and midfoot with fat layer exposed: Secondary | ICD-10-CM | POA: Diagnosis not present

## 2017-08-23 DIAGNOSIS — M1991 Primary osteoarthritis, unspecified site: Secondary | ICD-10-CM | POA: Diagnosis not present

## 2017-08-23 DIAGNOSIS — M10031 Idiopathic gout, right wrist: Secondary | ICD-10-CM | POA: Diagnosis not present

## 2017-08-23 DIAGNOSIS — Z87891 Personal history of nicotine dependence: Secondary | ICD-10-CM | POA: Diagnosis not present

## 2017-08-23 DIAGNOSIS — J449 Chronic obstructive pulmonary disease, unspecified: Secondary | ICD-10-CM | POA: Diagnosis not present

## 2017-08-23 DIAGNOSIS — I110 Hypertensive heart disease with heart failure: Secondary | ICD-10-CM | POA: Diagnosis not present

## 2017-08-23 DIAGNOSIS — Z7982 Long term (current) use of aspirin: Secondary | ICD-10-CM | POA: Diagnosis not present

## 2017-08-23 DIAGNOSIS — E1142 Type 2 diabetes mellitus with diabetic polyneuropathy: Secondary | ICD-10-CM | POA: Diagnosis not present

## 2017-08-23 DIAGNOSIS — I509 Heart failure, unspecified: Secondary | ICD-10-CM | POA: Diagnosis not present

## 2017-08-23 DIAGNOSIS — L97412 Non-pressure chronic ulcer of right heel and midfoot with fat layer exposed: Secondary | ICD-10-CM | POA: Diagnosis not present

## 2017-08-23 DIAGNOSIS — D509 Iron deficiency anemia, unspecified: Secondary | ICD-10-CM | POA: Diagnosis not present

## 2017-08-23 DIAGNOSIS — Z794 Long term (current) use of insulin: Secondary | ICD-10-CM | POA: Diagnosis not present

## 2017-08-23 DIAGNOSIS — E11621 Type 2 diabetes mellitus with foot ulcer: Secondary | ICD-10-CM | POA: Diagnosis not present

## 2017-08-25 DIAGNOSIS — E11621 Type 2 diabetes mellitus with foot ulcer: Secondary | ICD-10-CM | POA: Diagnosis not present

## 2017-08-25 DIAGNOSIS — L97412 Non-pressure chronic ulcer of right heel and midfoot with fat layer exposed: Secondary | ICD-10-CM | POA: Diagnosis not present

## 2017-08-25 DIAGNOSIS — I509 Heart failure, unspecified: Secondary | ICD-10-CM | POA: Diagnosis not present

## 2017-08-25 DIAGNOSIS — I110 Hypertensive heart disease with heart failure: Secondary | ICD-10-CM | POA: Diagnosis not present

## 2017-08-25 DIAGNOSIS — M10031 Idiopathic gout, right wrist: Secondary | ICD-10-CM | POA: Diagnosis not present

## 2017-08-25 DIAGNOSIS — J449 Chronic obstructive pulmonary disease, unspecified: Secondary | ICD-10-CM | POA: Diagnosis not present

## 2017-08-25 DIAGNOSIS — Z7982 Long term (current) use of aspirin: Secondary | ICD-10-CM | POA: Diagnosis not present

## 2017-08-25 DIAGNOSIS — Z87891 Personal history of nicotine dependence: Secondary | ICD-10-CM | POA: Diagnosis not present

## 2017-08-25 DIAGNOSIS — D509 Iron deficiency anemia, unspecified: Secondary | ICD-10-CM | POA: Diagnosis not present

## 2017-08-25 DIAGNOSIS — Z794 Long term (current) use of insulin: Secondary | ICD-10-CM | POA: Diagnosis not present

## 2017-08-25 DIAGNOSIS — E1142 Type 2 diabetes mellitus with diabetic polyneuropathy: Secondary | ICD-10-CM | POA: Diagnosis not present

## 2017-08-25 DIAGNOSIS — M1991 Primary osteoarthritis, unspecified site: Secondary | ICD-10-CM | POA: Diagnosis not present

## 2017-08-27 DIAGNOSIS — I509 Heart failure, unspecified: Secondary | ICD-10-CM | POA: Diagnosis not present

## 2017-08-27 DIAGNOSIS — E1142 Type 2 diabetes mellitus with diabetic polyneuropathy: Secondary | ICD-10-CM | POA: Diagnosis not present

## 2017-08-27 DIAGNOSIS — L97412 Non-pressure chronic ulcer of right heel and midfoot with fat layer exposed: Secondary | ICD-10-CM | POA: Diagnosis not present

## 2017-08-27 DIAGNOSIS — E11621 Type 2 diabetes mellitus with foot ulcer: Secondary | ICD-10-CM | POA: Diagnosis not present

## 2017-08-27 DIAGNOSIS — D509 Iron deficiency anemia, unspecified: Secondary | ICD-10-CM | POA: Diagnosis not present

## 2017-08-27 DIAGNOSIS — Z794 Long term (current) use of insulin: Secondary | ICD-10-CM | POA: Diagnosis not present

## 2017-08-27 DIAGNOSIS — Z7982 Long term (current) use of aspirin: Secondary | ICD-10-CM | POA: Diagnosis not present

## 2017-08-27 DIAGNOSIS — M1991 Primary osteoarthritis, unspecified site: Secondary | ICD-10-CM | POA: Diagnosis not present

## 2017-08-27 DIAGNOSIS — J449 Chronic obstructive pulmonary disease, unspecified: Secondary | ICD-10-CM | POA: Diagnosis not present

## 2017-08-27 DIAGNOSIS — M10031 Idiopathic gout, right wrist: Secondary | ICD-10-CM | POA: Diagnosis not present

## 2017-08-27 DIAGNOSIS — Z87891 Personal history of nicotine dependence: Secondary | ICD-10-CM | POA: Diagnosis not present

## 2017-08-27 DIAGNOSIS — I110 Hypertensive heart disease with heart failure: Secondary | ICD-10-CM | POA: Diagnosis not present

## 2017-08-30 DIAGNOSIS — E1142 Type 2 diabetes mellitus with diabetic polyneuropathy: Secondary | ICD-10-CM | POA: Diagnosis not present

## 2017-08-30 DIAGNOSIS — M1991 Primary osteoarthritis, unspecified site: Secondary | ICD-10-CM | POA: Diagnosis not present

## 2017-08-30 DIAGNOSIS — L97412 Non-pressure chronic ulcer of right heel and midfoot with fat layer exposed: Secondary | ICD-10-CM | POA: Diagnosis not present

## 2017-08-30 DIAGNOSIS — J449 Chronic obstructive pulmonary disease, unspecified: Secondary | ICD-10-CM | POA: Diagnosis not present

## 2017-08-30 DIAGNOSIS — Z87891 Personal history of nicotine dependence: Secondary | ICD-10-CM | POA: Diagnosis not present

## 2017-08-30 DIAGNOSIS — Z7982 Long term (current) use of aspirin: Secondary | ICD-10-CM | POA: Diagnosis not present

## 2017-08-30 DIAGNOSIS — D509 Iron deficiency anemia, unspecified: Secondary | ICD-10-CM | POA: Diagnosis not present

## 2017-08-30 DIAGNOSIS — E11621 Type 2 diabetes mellitus with foot ulcer: Secondary | ICD-10-CM | POA: Diagnosis not present

## 2017-08-30 DIAGNOSIS — M10031 Idiopathic gout, right wrist: Secondary | ICD-10-CM | POA: Diagnosis not present

## 2017-08-30 DIAGNOSIS — Z794 Long term (current) use of insulin: Secondary | ICD-10-CM | POA: Diagnosis not present

## 2017-08-30 DIAGNOSIS — I509 Heart failure, unspecified: Secondary | ICD-10-CM | POA: Diagnosis not present

## 2017-08-30 DIAGNOSIS — I110 Hypertensive heart disease with heart failure: Secondary | ICD-10-CM | POA: Diagnosis not present

## 2017-09-01 DIAGNOSIS — Z7982 Long term (current) use of aspirin: Secondary | ICD-10-CM | POA: Diagnosis not present

## 2017-09-01 DIAGNOSIS — L97412 Non-pressure chronic ulcer of right heel and midfoot with fat layer exposed: Secondary | ICD-10-CM | POA: Diagnosis not present

## 2017-09-01 DIAGNOSIS — E11621 Type 2 diabetes mellitus with foot ulcer: Secondary | ICD-10-CM | POA: Diagnosis not present

## 2017-09-01 DIAGNOSIS — M10031 Idiopathic gout, right wrist: Secondary | ICD-10-CM | POA: Diagnosis not present

## 2017-09-01 DIAGNOSIS — Z794 Long term (current) use of insulin: Secondary | ICD-10-CM | POA: Diagnosis not present

## 2017-09-01 DIAGNOSIS — I509 Heart failure, unspecified: Secondary | ICD-10-CM | POA: Diagnosis not present

## 2017-09-01 DIAGNOSIS — D509 Iron deficiency anemia, unspecified: Secondary | ICD-10-CM | POA: Diagnosis not present

## 2017-09-01 DIAGNOSIS — J449 Chronic obstructive pulmonary disease, unspecified: Secondary | ICD-10-CM | POA: Diagnosis not present

## 2017-09-01 DIAGNOSIS — M1991 Primary osteoarthritis, unspecified site: Secondary | ICD-10-CM | POA: Diagnosis not present

## 2017-09-01 DIAGNOSIS — E1142 Type 2 diabetes mellitus with diabetic polyneuropathy: Secondary | ICD-10-CM | POA: Diagnosis not present

## 2017-09-01 DIAGNOSIS — Z87891 Personal history of nicotine dependence: Secondary | ICD-10-CM | POA: Diagnosis not present

## 2017-09-01 DIAGNOSIS — I110 Hypertensive heart disease with heart failure: Secondary | ICD-10-CM | POA: Diagnosis not present

## 2017-09-03 DIAGNOSIS — Z794 Long term (current) use of insulin: Secondary | ICD-10-CM | POA: Diagnosis not present

## 2017-09-03 DIAGNOSIS — M10031 Idiopathic gout, right wrist: Secondary | ICD-10-CM | POA: Diagnosis not present

## 2017-09-03 DIAGNOSIS — E11621 Type 2 diabetes mellitus with foot ulcer: Secondary | ICD-10-CM | POA: Diagnosis not present

## 2017-09-03 DIAGNOSIS — J449 Chronic obstructive pulmonary disease, unspecified: Secondary | ICD-10-CM | POA: Diagnosis not present

## 2017-09-03 DIAGNOSIS — Z7982 Long term (current) use of aspirin: Secondary | ICD-10-CM | POA: Diagnosis not present

## 2017-09-03 DIAGNOSIS — L97412 Non-pressure chronic ulcer of right heel and midfoot with fat layer exposed: Secondary | ICD-10-CM | POA: Diagnosis not present

## 2017-09-03 DIAGNOSIS — M1991 Primary osteoarthritis, unspecified site: Secondary | ICD-10-CM | POA: Diagnosis not present

## 2017-09-03 DIAGNOSIS — Z87891 Personal history of nicotine dependence: Secondary | ICD-10-CM | POA: Diagnosis not present

## 2017-09-03 DIAGNOSIS — I509 Heart failure, unspecified: Secondary | ICD-10-CM | POA: Diagnosis not present

## 2017-09-03 DIAGNOSIS — I110 Hypertensive heart disease with heart failure: Secondary | ICD-10-CM | POA: Diagnosis not present

## 2017-09-03 DIAGNOSIS — D509 Iron deficiency anemia, unspecified: Secondary | ICD-10-CM | POA: Diagnosis not present

## 2017-09-03 DIAGNOSIS — E1142 Type 2 diabetes mellitus with diabetic polyneuropathy: Secondary | ICD-10-CM | POA: Diagnosis not present

## 2017-09-05 DIAGNOSIS — J449 Chronic obstructive pulmonary disease, unspecified: Secondary | ICD-10-CM | POA: Diagnosis not present

## 2017-09-06 DIAGNOSIS — J449 Chronic obstructive pulmonary disease, unspecified: Secondary | ICD-10-CM | POA: Diagnosis not present

## 2017-09-06 DIAGNOSIS — E11621 Type 2 diabetes mellitus with foot ulcer: Secondary | ICD-10-CM | POA: Diagnosis not present

## 2017-09-06 DIAGNOSIS — Z794 Long term (current) use of insulin: Secondary | ICD-10-CM | POA: Diagnosis not present

## 2017-09-06 DIAGNOSIS — Z87891 Personal history of nicotine dependence: Secondary | ICD-10-CM | POA: Diagnosis not present

## 2017-09-06 DIAGNOSIS — I110 Hypertensive heart disease with heart failure: Secondary | ICD-10-CM | POA: Diagnosis not present

## 2017-09-06 DIAGNOSIS — E1142 Type 2 diabetes mellitus with diabetic polyneuropathy: Secondary | ICD-10-CM | POA: Diagnosis not present

## 2017-09-06 DIAGNOSIS — M1991 Primary osteoarthritis, unspecified site: Secondary | ICD-10-CM | POA: Diagnosis not present

## 2017-09-06 DIAGNOSIS — L97412 Non-pressure chronic ulcer of right heel and midfoot with fat layer exposed: Secondary | ICD-10-CM | POA: Diagnosis not present

## 2017-09-06 DIAGNOSIS — Z7982 Long term (current) use of aspirin: Secondary | ICD-10-CM | POA: Diagnosis not present

## 2017-09-06 DIAGNOSIS — M10031 Idiopathic gout, right wrist: Secondary | ICD-10-CM | POA: Diagnosis not present

## 2017-09-06 DIAGNOSIS — I509 Heart failure, unspecified: Secondary | ICD-10-CM | POA: Diagnosis not present

## 2017-09-06 DIAGNOSIS — D509 Iron deficiency anemia, unspecified: Secondary | ICD-10-CM | POA: Diagnosis not present

## 2017-09-08 DIAGNOSIS — Z794 Long term (current) use of insulin: Secondary | ICD-10-CM | POA: Diagnosis not present

## 2017-09-08 DIAGNOSIS — E11621 Type 2 diabetes mellitus with foot ulcer: Secondary | ICD-10-CM | POA: Diagnosis not present

## 2017-09-08 DIAGNOSIS — Z87891 Personal history of nicotine dependence: Secondary | ICD-10-CM | POA: Diagnosis not present

## 2017-09-08 DIAGNOSIS — Z7982 Long term (current) use of aspirin: Secondary | ICD-10-CM | POA: Diagnosis not present

## 2017-09-08 DIAGNOSIS — J449 Chronic obstructive pulmonary disease, unspecified: Secondary | ICD-10-CM | POA: Diagnosis not present

## 2017-09-08 DIAGNOSIS — M10031 Idiopathic gout, right wrist: Secondary | ICD-10-CM | POA: Diagnosis not present

## 2017-09-08 DIAGNOSIS — I509 Heart failure, unspecified: Secondary | ICD-10-CM | POA: Diagnosis not present

## 2017-09-08 DIAGNOSIS — L97412 Non-pressure chronic ulcer of right heel and midfoot with fat layer exposed: Secondary | ICD-10-CM | POA: Diagnosis not present

## 2017-09-08 DIAGNOSIS — M1991 Primary osteoarthritis, unspecified site: Secondary | ICD-10-CM | POA: Diagnosis not present

## 2017-09-08 DIAGNOSIS — D509 Iron deficiency anemia, unspecified: Secondary | ICD-10-CM | POA: Diagnosis not present

## 2017-09-08 DIAGNOSIS — I110 Hypertensive heart disease with heart failure: Secondary | ICD-10-CM | POA: Diagnosis not present

## 2017-09-08 DIAGNOSIS — E1142 Type 2 diabetes mellitus with diabetic polyneuropathy: Secondary | ICD-10-CM | POA: Diagnosis not present

## 2017-09-10 DIAGNOSIS — J449 Chronic obstructive pulmonary disease, unspecified: Secondary | ICD-10-CM | POA: Diagnosis not present

## 2017-09-10 DIAGNOSIS — Z7982 Long term (current) use of aspirin: Secondary | ICD-10-CM | POA: Diagnosis not present

## 2017-09-10 DIAGNOSIS — M10031 Idiopathic gout, right wrist: Secondary | ICD-10-CM | POA: Diagnosis not present

## 2017-09-10 DIAGNOSIS — I509 Heart failure, unspecified: Secondary | ICD-10-CM | POA: Diagnosis not present

## 2017-09-10 DIAGNOSIS — E11621 Type 2 diabetes mellitus with foot ulcer: Secondary | ICD-10-CM | POA: Diagnosis not present

## 2017-09-10 DIAGNOSIS — M8580 Other specified disorders of bone density and structure, unspecified site: Secondary | ICD-10-CM | POA: Diagnosis not present

## 2017-09-10 DIAGNOSIS — L97412 Non-pressure chronic ulcer of right heel and midfoot with fat layer exposed: Secondary | ICD-10-CM | POA: Diagnosis not present

## 2017-09-10 DIAGNOSIS — E1343 Other specified diabetes mellitus with diabetic autonomic (poly)neuropathy: Secondary | ICD-10-CM | POA: Diagnosis not present

## 2017-09-10 DIAGNOSIS — I10 Essential (primary) hypertension: Secondary | ICD-10-CM | POA: Diagnosis not present

## 2017-09-10 DIAGNOSIS — M1991 Primary osteoarthritis, unspecified site: Secondary | ICD-10-CM | POA: Diagnosis not present

## 2017-09-10 DIAGNOSIS — E1142 Type 2 diabetes mellitus with diabetic polyneuropathy: Secondary | ICD-10-CM | POA: Diagnosis not present

## 2017-09-10 DIAGNOSIS — Z87891 Personal history of nicotine dependence: Secondary | ICD-10-CM | POA: Diagnosis not present

## 2017-09-10 DIAGNOSIS — Z794 Long term (current) use of insulin: Secondary | ICD-10-CM | POA: Diagnosis not present

## 2017-09-10 DIAGNOSIS — M109 Gout, unspecified: Secondary | ICD-10-CM | POA: Diagnosis not present

## 2017-09-10 DIAGNOSIS — I110 Hypertensive heart disease with heart failure: Secondary | ICD-10-CM | POA: Diagnosis not present

## 2017-09-10 DIAGNOSIS — E785 Hyperlipidemia, unspecified: Secondary | ICD-10-CM | POA: Diagnosis not present

## 2017-09-10 DIAGNOSIS — D509 Iron deficiency anemia, unspecified: Secondary | ICD-10-CM | POA: Diagnosis not present

## 2017-09-10 DIAGNOSIS — E119 Type 2 diabetes mellitus without complications: Secondary | ICD-10-CM | POA: Diagnosis not present

## 2017-09-13 DIAGNOSIS — J449 Chronic obstructive pulmonary disease, unspecified: Secondary | ICD-10-CM | POA: Diagnosis not present

## 2017-09-13 DIAGNOSIS — L97412 Non-pressure chronic ulcer of right heel and midfoot with fat layer exposed: Secondary | ICD-10-CM | POA: Diagnosis not present

## 2017-09-13 DIAGNOSIS — I110 Hypertensive heart disease with heart failure: Secondary | ICD-10-CM | POA: Diagnosis not present

## 2017-09-13 DIAGNOSIS — Z7982 Long term (current) use of aspirin: Secondary | ICD-10-CM | POA: Diagnosis not present

## 2017-09-13 DIAGNOSIS — I509 Heart failure, unspecified: Secondary | ICD-10-CM | POA: Diagnosis not present

## 2017-09-13 DIAGNOSIS — Z87891 Personal history of nicotine dependence: Secondary | ICD-10-CM | POA: Diagnosis not present

## 2017-09-13 DIAGNOSIS — M10031 Idiopathic gout, right wrist: Secondary | ICD-10-CM | POA: Diagnosis not present

## 2017-09-13 DIAGNOSIS — M1991 Primary osteoarthritis, unspecified site: Secondary | ICD-10-CM | POA: Diagnosis not present

## 2017-09-13 DIAGNOSIS — D509 Iron deficiency anemia, unspecified: Secondary | ICD-10-CM | POA: Diagnosis not present

## 2017-09-13 DIAGNOSIS — Z794 Long term (current) use of insulin: Secondary | ICD-10-CM | POA: Diagnosis not present

## 2017-09-13 DIAGNOSIS — E11621 Type 2 diabetes mellitus with foot ulcer: Secondary | ICD-10-CM | POA: Diagnosis not present

## 2017-09-13 DIAGNOSIS — E1142 Type 2 diabetes mellitus with diabetic polyneuropathy: Secondary | ICD-10-CM | POA: Diagnosis not present

## 2017-09-15 DIAGNOSIS — L97412 Non-pressure chronic ulcer of right heel and midfoot with fat layer exposed: Secondary | ICD-10-CM | POA: Diagnosis not present

## 2017-09-15 DIAGNOSIS — E11621 Type 2 diabetes mellitus with foot ulcer: Secondary | ICD-10-CM | POA: Diagnosis not present

## 2017-09-15 DIAGNOSIS — Z7982 Long term (current) use of aspirin: Secondary | ICD-10-CM | POA: Diagnosis not present

## 2017-09-15 DIAGNOSIS — I110 Hypertensive heart disease with heart failure: Secondary | ICD-10-CM | POA: Diagnosis not present

## 2017-09-15 DIAGNOSIS — Z794 Long term (current) use of insulin: Secondary | ICD-10-CM | POA: Diagnosis not present

## 2017-09-15 DIAGNOSIS — Z87891 Personal history of nicotine dependence: Secondary | ICD-10-CM | POA: Diagnosis not present

## 2017-09-15 DIAGNOSIS — J449 Chronic obstructive pulmonary disease, unspecified: Secondary | ICD-10-CM | POA: Diagnosis not present

## 2017-09-15 DIAGNOSIS — D509 Iron deficiency anemia, unspecified: Secondary | ICD-10-CM | POA: Diagnosis not present

## 2017-09-15 DIAGNOSIS — E1142 Type 2 diabetes mellitus with diabetic polyneuropathy: Secondary | ICD-10-CM | POA: Diagnosis not present

## 2017-09-15 DIAGNOSIS — M1991 Primary osteoarthritis, unspecified site: Secondary | ICD-10-CM | POA: Diagnosis not present

## 2017-09-15 DIAGNOSIS — I509 Heart failure, unspecified: Secondary | ICD-10-CM | POA: Diagnosis not present

## 2017-09-15 DIAGNOSIS — M10031 Idiopathic gout, right wrist: Secondary | ICD-10-CM | POA: Diagnosis not present

## 2017-09-16 DIAGNOSIS — L97412 Non-pressure chronic ulcer of right heel and midfoot with fat layer exposed: Secondary | ICD-10-CM | POA: Diagnosis not present

## 2017-09-16 DIAGNOSIS — E11621 Type 2 diabetes mellitus with foot ulcer: Secondary | ICD-10-CM | POA: Diagnosis not present

## 2017-09-20 DIAGNOSIS — D509 Iron deficiency anemia, unspecified: Secondary | ICD-10-CM | POA: Diagnosis not present

## 2017-09-20 DIAGNOSIS — Z7982 Long term (current) use of aspirin: Secondary | ICD-10-CM | POA: Diagnosis not present

## 2017-09-20 DIAGNOSIS — J449 Chronic obstructive pulmonary disease, unspecified: Secondary | ICD-10-CM | POA: Diagnosis not present

## 2017-09-20 DIAGNOSIS — I509 Heart failure, unspecified: Secondary | ICD-10-CM | POA: Diagnosis not present

## 2017-09-20 DIAGNOSIS — E1142 Type 2 diabetes mellitus with diabetic polyneuropathy: Secondary | ICD-10-CM | POA: Diagnosis not present

## 2017-09-20 DIAGNOSIS — Z794 Long term (current) use of insulin: Secondary | ICD-10-CM | POA: Diagnosis not present

## 2017-09-20 DIAGNOSIS — M1991 Primary osteoarthritis, unspecified site: Secondary | ICD-10-CM | POA: Diagnosis not present

## 2017-09-20 DIAGNOSIS — Z87891 Personal history of nicotine dependence: Secondary | ICD-10-CM | POA: Diagnosis not present

## 2017-09-20 DIAGNOSIS — M10031 Idiopathic gout, right wrist: Secondary | ICD-10-CM | POA: Diagnosis not present

## 2017-09-20 DIAGNOSIS — L97412 Non-pressure chronic ulcer of right heel and midfoot with fat layer exposed: Secondary | ICD-10-CM | POA: Diagnosis not present

## 2017-09-20 DIAGNOSIS — E11621 Type 2 diabetes mellitus with foot ulcer: Secondary | ICD-10-CM | POA: Diagnosis not present

## 2017-09-20 DIAGNOSIS — I110 Hypertensive heart disease with heart failure: Secondary | ICD-10-CM | POA: Diagnosis not present

## 2017-09-22 DIAGNOSIS — J449 Chronic obstructive pulmonary disease, unspecified: Secondary | ICD-10-CM | POA: Diagnosis not present

## 2017-09-22 DIAGNOSIS — Z87891 Personal history of nicotine dependence: Secondary | ICD-10-CM | POA: Diagnosis not present

## 2017-09-22 DIAGNOSIS — E11621 Type 2 diabetes mellitus with foot ulcer: Secondary | ICD-10-CM | POA: Diagnosis not present

## 2017-09-22 DIAGNOSIS — M10031 Idiopathic gout, right wrist: Secondary | ICD-10-CM | POA: Diagnosis not present

## 2017-09-22 DIAGNOSIS — I509 Heart failure, unspecified: Secondary | ICD-10-CM | POA: Diagnosis not present

## 2017-09-22 DIAGNOSIS — M1991 Primary osteoarthritis, unspecified site: Secondary | ICD-10-CM | POA: Diagnosis not present

## 2017-09-22 DIAGNOSIS — D509 Iron deficiency anemia, unspecified: Secondary | ICD-10-CM | POA: Diagnosis not present

## 2017-09-22 DIAGNOSIS — L97412 Non-pressure chronic ulcer of right heel and midfoot with fat layer exposed: Secondary | ICD-10-CM | POA: Diagnosis not present

## 2017-09-22 DIAGNOSIS — E1142 Type 2 diabetes mellitus with diabetic polyneuropathy: Secondary | ICD-10-CM | POA: Diagnosis not present

## 2017-09-22 DIAGNOSIS — Z794 Long term (current) use of insulin: Secondary | ICD-10-CM | POA: Diagnosis not present

## 2017-09-22 DIAGNOSIS — Z7982 Long term (current) use of aspirin: Secondary | ICD-10-CM | POA: Diagnosis not present

## 2017-09-22 DIAGNOSIS — I110 Hypertensive heart disease with heart failure: Secondary | ICD-10-CM | POA: Diagnosis not present

## 2017-09-24 DIAGNOSIS — I110 Hypertensive heart disease with heart failure: Secondary | ICD-10-CM | POA: Diagnosis not present

## 2017-09-24 DIAGNOSIS — D509 Iron deficiency anemia, unspecified: Secondary | ICD-10-CM | POA: Diagnosis not present

## 2017-09-24 DIAGNOSIS — E11621 Type 2 diabetes mellitus with foot ulcer: Secondary | ICD-10-CM | POA: Diagnosis not present

## 2017-09-24 DIAGNOSIS — E1142 Type 2 diabetes mellitus with diabetic polyneuropathy: Secondary | ICD-10-CM | POA: Diagnosis not present

## 2017-09-24 DIAGNOSIS — Z794 Long term (current) use of insulin: Secondary | ICD-10-CM | POA: Diagnosis not present

## 2017-09-24 DIAGNOSIS — J449 Chronic obstructive pulmonary disease, unspecified: Secondary | ICD-10-CM | POA: Diagnosis not present

## 2017-09-24 DIAGNOSIS — Z7982 Long term (current) use of aspirin: Secondary | ICD-10-CM | POA: Diagnosis not present

## 2017-09-24 DIAGNOSIS — Z87891 Personal history of nicotine dependence: Secondary | ICD-10-CM | POA: Diagnosis not present

## 2017-09-24 DIAGNOSIS — I509 Heart failure, unspecified: Secondary | ICD-10-CM | POA: Diagnosis not present

## 2017-09-24 DIAGNOSIS — M10031 Idiopathic gout, right wrist: Secondary | ICD-10-CM | POA: Diagnosis not present

## 2017-09-24 DIAGNOSIS — M1991 Primary osteoarthritis, unspecified site: Secondary | ICD-10-CM | POA: Diagnosis not present

## 2017-09-24 DIAGNOSIS — L97412 Non-pressure chronic ulcer of right heel and midfoot with fat layer exposed: Secondary | ICD-10-CM | POA: Diagnosis not present

## 2017-09-27 DIAGNOSIS — Z794 Long term (current) use of insulin: Secondary | ICD-10-CM | POA: Diagnosis not present

## 2017-09-27 DIAGNOSIS — M10031 Idiopathic gout, right wrist: Secondary | ICD-10-CM | POA: Diagnosis not present

## 2017-09-27 DIAGNOSIS — E11621 Type 2 diabetes mellitus with foot ulcer: Secondary | ICD-10-CM | POA: Diagnosis not present

## 2017-09-27 DIAGNOSIS — I110 Hypertensive heart disease with heart failure: Secondary | ICD-10-CM | POA: Diagnosis not present

## 2017-09-27 DIAGNOSIS — M1991 Primary osteoarthritis, unspecified site: Secondary | ICD-10-CM | POA: Diagnosis not present

## 2017-09-27 DIAGNOSIS — Z87891 Personal history of nicotine dependence: Secondary | ICD-10-CM | POA: Diagnosis not present

## 2017-09-27 DIAGNOSIS — J449 Chronic obstructive pulmonary disease, unspecified: Secondary | ICD-10-CM | POA: Diagnosis not present

## 2017-09-27 DIAGNOSIS — D509 Iron deficiency anemia, unspecified: Secondary | ICD-10-CM | POA: Diagnosis not present

## 2017-09-27 DIAGNOSIS — I509 Heart failure, unspecified: Secondary | ICD-10-CM | POA: Diagnosis not present

## 2017-09-27 DIAGNOSIS — L97412 Non-pressure chronic ulcer of right heel and midfoot with fat layer exposed: Secondary | ICD-10-CM | POA: Diagnosis not present

## 2017-09-27 DIAGNOSIS — E1142 Type 2 diabetes mellitus with diabetic polyneuropathy: Secondary | ICD-10-CM | POA: Diagnosis not present

## 2017-09-27 DIAGNOSIS — Z7982 Long term (current) use of aspirin: Secondary | ICD-10-CM | POA: Diagnosis not present

## 2017-09-30 DIAGNOSIS — J449 Chronic obstructive pulmonary disease, unspecified: Secondary | ICD-10-CM | POA: Diagnosis not present

## 2017-09-30 DIAGNOSIS — L97412 Non-pressure chronic ulcer of right heel and midfoot with fat layer exposed: Secondary | ICD-10-CM | POA: Diagnosis not present

## 2017-09-30 DIAGNOSIS — I110 Hypertensive heart disease with heart failure: Secondary | ICD-10-CM | POA: Diagnosis not present

## 2017-09-30 DIAGNOSIS — Z794 Long term (current) use of insulin: Secondary | ICD-10-CM | POA: Diagnosis not present

## 2017-09-30 DIAGNOSIS — Z87891 Personal history of nicotine dependence: Secondary | ICD-10-CM | POA: Diagnosis not present

## 2017-09-30 DIAGNOSIS — E11621 Type 2 diabetes mellitus with foot ulcer: Secondary | ICD-10-CM | POA: Diagnosis not present

## 2017-09-30 DIAGNOSIS — M1991 Primary osteoarthritis, unspecified site: Secondary | ICD-10-CM | POA: Diagnosis not present

## 2017-09-30 DIAGNOSIS — Z7982 Long term (current) use of aspirin: Secondary | ICD-10-CM | POA: Diagnosis not present

## 2017-09-30 DIAGNOSIS — D509 Iron deficiency anemia, unspecified: Secondary | ICD-10-CM | POA: Diagnosis not present

## 2017-09-30 DIAGNOSIS — E1142 Type 2 diabetes mellitus with diabetic polyneuropathy: Secondary | ICD-10-CM | POA: Diagnosis not present

## 2017-09-30 DIAGNOSIS — M10031 Idiopathic gout, right wrist: Secondary | ICD-10-CM | POA: Diagnosis not present

## 2017-09-30 DIAGNOSIS — I509 Heart failure, unspecified: Secondary | ICD-10-CM | POA: Diagnosis not present

## 2017-10-04 DIAGNOSIS — D509 Iron deficiency anemia, unspecified: Secondary | ICD-10-CM | POA: Diagnosis not present

## 2017-10-04 DIAGNOSIS — J449 Chronic obstructive pulmonary disease, unspecified: Secondary | ICD-10-CM | POA: Diagnosis not present

## 2017-10-04 DIAGNOSIS — Z7982 Long term (current) use of aspirin: Secondary | ICD-10-CM | POA: Diagnosis not present

## 2017-10-04 DIAGNOSIS — I110 Hypertensive heart disease with heart failure: Secondary | ICD-10-CM | POA: Diagnosis not present

## 2017-10-04 DIAGNOSIS — M1991 Primary osteoarthritis, unspecified site: Secondary | ICD-10-CM | POA: Diagnosis not present

## 2017-10-04 DIAGNOSIS — L97412 Non-pressure chronic ulcer of right heel and midfoot with fat layer exposed: Secondary | ICD-10-CM | POA: Diagnosis not present

## 2017-10-04 DIAGNOSIS — E1142 Type 2 diabetes mellitus with diabetic polyneuropathy: Secondary | ICD-10-CM | POA: Diagnosis not present

## 2017-10-04 DIAGNOSIS — Z794 Long term (current) use of insulin: Secondary | ICD-10-CM | POA: Diagnosis not present

## 2017-10-04 DIAGNOSIS — E11621 Type 2 diabetes mellitus with foot ulcer: Secondary | ICD-10-CM | POA: Diagnosis not present

## 2017-10-04 DIAGNOSIS — M10031 Idiopathic gout, right wrist: Secondary | ICD-10-CM | POA: Diagnosis not present

## 2017-10-04 DIAGNOSIS — Z87891 Personal history of nicotine dependence: Secondary | ICD-10-CM | POA: Diagnosis not present

## 2017-10-04 DIAGNOSIS — I509 Heart failure, unspecified: Secondary | ICD-10-CM | POA: Diagnosis not present

## 2017-10-05 DIAGNOSIS — J449 Chronic obstructive pulmonary disease, unspecified: Secondary | ICD-10-CM | POA: Diagnosis not present

## 2017-10-06 DIAGNOSIS — I110 Hypertensive heart disease with heart failure: Secondary | ICD-10-CM | POA: Diagnosis not present

## 2017-10-06 DIAGNOSIS — Z87891 Personal history of nicotine dependence: Secondary | ICD-10-CM | POA: Diagnosis not present

## 2017-10-06 DIAGNOSIS — L97412 Non-pressure chronic ulcer of right heel and midfoot with fat layer exposed: Secondary | ICD-10-CM | POA: Diagnosis not present

## 2017-10-06 DIAGNOSIS — Z794 Long term (current) use of insulin: Secondary | ICD-10-CM | POA: Diagnosis not present

## 2017-10-06 DIAGNOSIS — I509 Heart failure, unspecified: Secondary | ICD-10-CM | POA: Diagnosis not present

## 2017-10-06 DIAGNOSIS — J449 Chronic obstructive pulmonary disease, unspecified: Secondary | ICD-10-CM | POA: Diagnosis not present

## 2017-10-06 DIAGNOSIS — E11621 Type 2 diabetes mellitus with foot ulcer: Secondary | ICD-10-CM | POA: Diagnosis not present

## 2017-10-06 DIAGNOSIS — M10031 Idiopathic gout, right wrist: Secondary | ICD-10-CM | POA: Diagnosis not present

## 2017-10-06 DIAGNOSIS — Z7982 Long term (current) use of aspirin: Secondary | ICD-10-CM | POA: Diagnosis not present

## 2017-10-06 DIAGNOSIS — D509 Iron deficiency anemia, unspecified: Secondary | ICD-10-CM | POA: Diagnosis not present

## 2017-10-06 DIAGNOSIS — E1142 Type 2 diabetes mellitus with diabetic polyneuropathy: Secondary | ICD-10-CM | POA: Diagnosis not present

## 2017-10-06 DIAGNOSIS — M1991 Primary osteoarthritis, unspecified site: Secondary | ICD-10-CM | POA: Diagnosis not present

## 2017-10-11 DIAGNOSIS — Z794 Long term (current) use of insulin: Secondary | ICD-10-CM | POA: Diagnosis not present

## 2017-10-11 DIAGNOSIS — Z87891 Personal history of nicotine dependence: Secondary | ICD-10-CM | POA: Diagnosis not present

## 2017-10-11 DIAGNOSIS — M10031 Idiopathic gout, right wrist: Secondary | ICD-10-CM | POA: Diagnosis not present

## 2017-10-11 DIAGNOSIS — I509 Heart failure, unspecified: Secondary | ICD-10-CM | POA: Diagnosis not present

## 2017-10-11 DIAGNOSIS — I110 Hypertensive heart disease with heart failure: Secondary | ICD-10-CM | POA: Diagnosis not present

## 2017-10-11 DIAGNOSIS — J449 Chronic obstructive pulmonary disease, unspecified: Secondary | ICD-10-CM | POA: Diagnosis not present

## 2017-10-11 DIAGNOSIS — M1991 Primary osteoarthritis, unspecified site: Secondary | ICD-10-CM | POA: Diagnosis not present

## 2017-10-11 DIAGNOSIS — E11621 Type 2 diabetes mellitus with foot ulcer: Secondary | ICD-10-CM | POA: Diagnosis not present

## 2017-10-11 DIAGNOSIS — Z7982 Long term (current) use of aspirin: Secondary | ICD-10-CM | POA: Diagnosis not present

## 2017-10-11 DIAGNOSIS — E1142 Type 2 diabetes mellitus with diabetic polyneuropathy: Secondary | ICD-10-CM | POA: Diagnosis not present

## 2017-10-11 DIAGNOSIS — D509 Iron deficiency anemia, unspecified: Secondary | ICD-10-CM | POA: Diagnosis not present

## 2017-10-11 DIAGNOSIS — L97412 Non-pressure chronic ulcer of right heel and midfoot with fat layer exposed: Secondary | ICD-10-CM | POA: Diagnosis not present

## 2017-10-14 DIAGNOSIS — E1142 Type 2 diabetes mellitus with diabetic polyneuropathy: Secondary | ICD-10-CM | POA: Diagnosis not present

## 2017-10-14 DIAGNOSIS — J449 Chronic obstructive pulmonary disease, unspecified: Secondary | ICD-10-CM | POA: Diagnosis not present

## 2017-10-14 DIAGNOSIS — I509 Heart failure, unspecified: Secondary | ICD-10-CM | POA: Diagnosis not present

## 2017-10-14 DIAGNOSIS — M1991 Primary osteoarthritis, unspecified site: Secondary | ICD-10-CM | POA: Diagnosis not present

## 2017-10-14 DIAGNOSIS — M10031 Idiopathic gout, right wrist: Secondary | ICD-10-CM | POA: Diagnosis not present

## 2017-10-14 DIAGNOSIS — I110 Hypertensive heart disease with heart failure: Secondary | ICD-10-CM | POA: Diagnosis not present

## 2017-10-14 DIAGNOSIS — L97412 Non-pressure chronic ulcer of right heel and midfoot with fat layer exposed: Secondary | ICD-10-CM | POA: Diagnosis not present

## 2017-10-14 DIAGNOSIS — D509 Iron deficiency anemia, unspecified: Secondary | ICD-10-CM | POA: Diagnosis not present

## 2017-10-14 DIAGNOSIS — Z87891 Personal history of nicotine dependence: Secondary | ICD-10-CM | POA: Diagnosis not present

## 2017-10-14 DIAGNOSIS — Z7982 Long term (current) use of aspirin: Secondary | ICD-10-CM | POA: Diagnosis not present

## 2017-10-14 DIAGNOSIS — Z794 Long term (current) use of insulin: Secondary | ICD-10-CM | POA: Diagnosis not present

## 2017-10-14 DIAGNOSIS — E11621 Type 2 diabetes mellitus with foot ulcer: Secondary | ICD-10-CM | POA: Diagnosis not present

## 2017-10-18 DIAGNOSIS — I509 Heart failure, unspecified: Secondary | ICD-10-CM | POA: Diagnosis not present

## 2017-10-18 DIAGNOSIS — E11621 Type 2 diabetes mellitus with foot ulcer: Secondary | ICD-10-CM | POA: Diagnosis not present

## 2017-10-18 DIAGNOSIS — M10031 Idiopathic gout, right wrist: Secondary | ICD-10-CM | POA: Diagnosis not present

## 2017-10-18 DIAGNOSIS — I110 Hypertensive heart disease with heart failure: Secondary | ICD-10-CM | POA: Diagnosis not present

## 2017-10-18 DIAGNOSIS — J449 Chronic obstructive pulmonary disease, unspecified: Secondary | ICD-10-CM | POA: Diagnosis not present

## 2017-10-18 DIAGNOSIS — Z87891 Personal history of nicotine dependence: Secondary | ICD-10-CM | POA: Diagnosis not present

## 2017-10-18 DIAGNOSIS — M1991 Primary osteoarthritis, unspecified site: Secondary | ICD-10-CM | POA: Diagnosis not present

## 2017-10-18 DIAGNOSIS — D509 Iron deficiency anemia, unspecified: Secondary | ICD-10-CM | POA: Diagnosis not present

## 2017-10-18 DIAGNOSIS — L97412 Non-pressure chronic ulcer of right heel and midfoot with fat layer exposed: Secondary | ICD-10-CM | POA: Diagnosis not present

## 2017-10-18 DIAGNOSIS — E1142 Type 2 diabetes mellitus with diabetic polyneuropathy: Secondary | ICD-10-CM | POA: Diagnosis not present

## 2017-10-18 DIAGNOSIS — Z7982 Long term (current) use of aspirin: Secondary | ICD-10-CM | POA: Diagnosis not present

## 2017-10-18 DIAGNOSIS — Z794 Long term (current) use of insulin: Secondary | ICD-10-CM | POA: Diagnosis not present

## 2017-10-21 DIAGNOSIS — Z87891 Personal history of nicotine dependence: Secondary | ICD-10-CM | POA: Diagnosis not present

## 2017-10-21 DIAGNOSIS — M1991 Primary osteoarthritis, unspecified site: Secondary | ICD-10-CM | POA: Diagnosis not present

## 2017-10-21 DIAGNOSIS — I509 Heart failure, unspecified: Secondary | ICD-10-CM | POA: Diagnosis not present

## 2017-10-21 DIAGNOSIS — E1142 Type 2 diabetes mellitus with diabetic polyneuropathy: Secondary | ICD-10-CM | POA: Diagnosis not present

## 2017-10-21 DIAGNOSIS — Z7982 Long term (current) use of aspirin: Secondary | ICD-10-CM | POA: Diagnosis not present

## 2017-10-21 DIAGNOSIS — M10031 Idiopathic gout, right wrist: Secondary | ICD-10-CM | POA: Diagnosis not present

## 2017-10-21 DIAGNOSIS — I110 Hypertensive heart disease with heart failure: Secondary | ICD-10-CM | POA: Diagnosis not present

## 2017-10-21 DIAGNOSIS — Z794 Long term (current) use of insulin: Secondary | ICD-10-CM | POA: Diagnosis not present

## 2017-10-21 DIAGNOSIS — D509 Iron deficiency anemia, unspecified: Secondary | ICD-10-CM | POA: Diagnosis not present

## 2017-10-21 DIAGNOSIS — E11621 Type 2 diabetes mellitus with foot ulcer: Secondary | ICD-10-CM | POA: Diagnosis not present

## 2017-10-21 DIAGNOSIS — J449 Chronic obstructive pulmonary disease, unspecified: Secondary | ICD-10-CM | POA: Diagnosis not present

## 2017-10-21 DIAGNOSIS — L97412 Non-pressure chronic ulcer of right heel and midfoot with fat layer exposed: Secondary | ICD-10-CM | POA: Diagnosis not present

## 2017-10-25 DIAGNOSIS — Z794 Long term (current) use of insulin: Secondary | ICD-10-CM | POA: Diagnosis not present

## 2017-10-25 DIAGNOSIS — J449 Chronic obstructive pulmonary disease, unspecified: Secondary | ICD-10-CM | POA: Diagnosis not present

## 2017-10-25 DIAGNOSIS — M1991 Primary osteoarthritis, unspecified site: Secondary | ICD-10-CM | POA: Diagnosis not present

## 2017-10-25 DIAGNOSIS — Z87891 Personal history of nicotine dependence: Secondary | ICD-10-CM | POA: Diagnosis not present

## 2017-10-25 DIAGNOSIS — I509 Heart failure, unspecified: Secondary | ICD-10-CM | POA: Diagnosis not present

## 2017-10-25 DIAGNOSIS — E11621 Type 2 diabetes mellitus with foot ulcer: Secondary | ICD-10-CM | POA: Diagnosis not present

## 2017-10-25 DIAGNOSIS — M10031 Idiopathic gout, right wrist: Secondary | ICD-10-CM | POA: Diagnosis not present

## 2017-10-25 DIAGNOSIS — D509 Iron deficiency anemia, unspecified: Secondary | ICD-10-CM | POA: Diagnosis not present

## 2017-10-25 DIAGNOSIS — I110 Hypertensive heart disease with heart failure: Secondary | ICD-10-CM | POA: Diagnosis not present

## 2017-10-25 DIAGNOSIS — Z7982 Long term (current) use of aspirin: Secondary | ICD-10-CM | POA: Diagnosis not present

## 2017-10-25 DIAGNOSIS — E1142 Type 2 diabetes mellitus with diabetic polyneuropathy: Secondary | ICD-10-CM | POA: Diagnosis not present

## 2017-10-25 DIAGNOSIS — L97412 Non-pressure chronic ulcer of right heel and midfoot with fat layer exposed: Secondary | ICD-10-CM | POA: Diagnosis not present

## 2017-10-28 DIAGNOSIS — L97412 Non-pressure chronic ulcer of right heel and midfoot with fat layer exposed: Secondary | ICD-10-CM | POA: Diagnosis not present

## 2017-10-28 DIAGNOSIS — Z794 Long term (current) use of insulin: Secondary | ICD-10-CM | POA: Diagnosis not present

## 2017-10-28 DIAGNOSIS — E1142 Type 2 diabetes mellitus with diabetic polyneuropathy: Secondary | ICD-10-CM | POA: Diagnosis not present

## 2017-10-28 DIAGNOSIS — Z7982 Long term (current) use of aspirin: Secondary | ICD-10-CM | POA: Diagnosis not present

## 2017-10-28 DIAGNOSIS — Z87891 Personal history of nicotine dependence: Secondary | ICD-10-CM | POA: Diagnosis not present

## 2017-10-28 DIAGNOSIS — I110 Hypertensive heart disease with heart failure: Secondary | ICD-10-CM | POA: Diagnosis not present

## 2017-10-28 DIAGNOSIS — J449 Chronic obstructive pulmonary disease, unspecified: Secondary | ICD-10-CM | POA: Diagnosis not present

## 2017-10-28 DIAGNOSIS — M1991 Primary osteoarthritis, unspecified site: Secondary | ICD-10-CM | POA: Diagnosis not present

## 2017-10-28 DIAGNOSIS — M10031 Idiopathic gout, right wrist: Secondary | ICD-10-CM | POA: Diagnosis not present

## 2017-10-28 DIAGNOSIS — D509 Iron deficiency anemia, unspecified: Secondary | ICD-10-CM | POA: Diagnosis not present

## 2017-10-28 DIAGNOSIS — I509 Heart failure, unspecified: Secondary | ICD-10-CM | POA: Diagnosis not present

## 2017-10-28 DIAGNOSIS — E11621 Type 2 diabetes mellitus with foot ulcer: Secondary | ICD-10-CM | POA: Diagnosis not present

## 2017-11-01 DIAGNOSIS — L97412 Non-pressure chronic ulcer of right heel and midfoot with fat layer exposed: Secondary | ICD-10-CM | POA: Diagnosis not present

## 2017-11-01 DIAGNOSIS — I872 Venous insufficiency (chronic) (peripheral): Secondary | ICD-10-CM | POA: Diagnosis not present

## 2017-11-04 DIAGNOSIS — M10031 Idiopathic gout, right wrist: Secondary | ICD-10-CM | POA: Diagnosis not present

## 2017-11-04 DIAGNOSIS — Z794 Long term (current) use of insulin: Secondary | ICD-10-CM | POA: Diagnosis not present

## 2017-11-04 DIAGNOSIS — I509 Heart failure, unspecified: Secondary | ICD-10-CM | POA: Diagnosis not present

## 2017-11-04 DIAGNOSIS — Z7982 Long term (current) use of aspirin: Secondary | ICD-10-CM | POA: Diagnosis not present

## 2017-11-04 DIAGNOSIS — M1991 Primary osteoarthritis, unspecified site: Secondary | ICD-10-CM | POA: Diagnosis not present

## 2017-11-04 DIAGNOSIS — E11621 Type 2 diabetes mellitus with foot ulcer: Secondary | ICD-10-CM | POA: Diagnosis not present

## 2017-11-04 DIAGNOSIS — J449 Chronic obstructive pulmonary disease, unspecified: Secondary | ICD-10-CM | POA: Diagnosis not present

## 2017-11-04 DIAGNOSIS — Z87891 Personal history of nicotine dependence: Secondary | ICD-10-CM | POA: Diagnosis not present

## 2017-11-04 DIAGNOSIS — D509 Iron deficiency anemia, unspecified: Secondary | ICD-10-CM | POA: Diagnosis not present

## 2017-11-04 DIAGNOSIS — E1142 Type 2 diabetes mellitus with diabetic polyneuropathy: Secondary | ICD-10-CM | POA: Diagnosis not present

## 2017-11-04 DIAGNOSIS — L97412 Non-pressure chronic ulcer of right heel and midfoot with fat layer exposed: Secondary | ICD-10-CM | POA: Diagnosis not present

## 2017-11-04 DIAGNOSIS — I110 Hypertensive heart disease with heart failure: Secondary | ICD-10-CM | POA: Diagnosis not present

## 2017-11-05 DIAGNOSIS — I872 Venous insufficiency (chronic) (peripheral): Secondary | ICD-10-CM | POA: Diagnosis not present

## 2017-11-05 DIAGNOSIS — J449 Chronic obstructive pulmonary disease, unspecified: Secondary | ICD-10-CM | POA: Diagnosis not present

## 2017-11-05 DIAGNOSIS — Z7409 Other reduced mobility: Secondary | ICD-10-CM | POA: Diagnosis not present

## 2017-11-05 DIAGNOSIS — R2689 Other abnormalities of gait and mobility: Secondary | ICD-10-CM | POA: Diagnosis not present

## 2017-11-08 DIAGNOSIS — M1991 Primary osteoarthritis, unspecified site: Secondary | ICD-10-CM | POA: Diagnosis not present

## 2017-11-08 DIAGNOSIS — I110 Hypertensive heart disease with heart failure: Secondary | ICD-10-CM | POA: Diagnosis not present

## 2017-11-08 DIAGNOSIS — Z87891 Personal history of nicotine dependence: Secondary | ICD-10-CM | POA: Diagnosis not present

## 2017-11-08 DIAGNOSIS — L97412 Non-pressure chronic ulcer of right heel and midfoot with fat layer exposed: Secondary | ICD-10-CM | POA: Diagnosis not present

## 2017-11-08 DIAGNOSIS — D509 Iron deficiency anemia, unspecified: Secondary | ICD-10-CM | POA: Diagnosis not present

## 2017-11-08 DIAGNOSIS — E11621 Type 2 diabetes mellitus with foot ulcer: Secondary | ICD-10-CM | POA: Diagnosis not present

## 2017-11-08 DIAGNOSIS — J449 Chronic obstructive pulmonary disease, unspecified: Secondary | ICD-10-CM | POA: Diagnosis not present

## 2017-11-08 DIAGNOSIS — I509 Heart failure, unspecified: Secondary | ICD-10-CM | POA: Diagnosis not present

## 2017-11-08 DIAGNOSIS — Z7982 Long term (current) use of aspirin: Secondary | ICD-10-CM | POA: Diagnosis not present

## 2017-11-08 DIAGNOSIS — Z794 Long term (current) use of insulin: Secondary | ICD-10-CM | POA: Diagnosis not present

## 2017-11-08 DIAGNOSIS — E1142 Type 2 diabetes mellitus with diabetic polyneuropathy: Secondary | ICD-10-CM | POA: Diagnosis not present

## 2017-11-08 DIAGNOSIS — M10031 Idiopathic gout, right wrist: Secondary | ICD-10-CM | POA: Diagnosis not present

## 2017-11-10 DIAGNOSIS — E785 Hyperlipidemia, unspecified: Secondary | ICD-10-CM | POA: Diagnosis not present

## 2017-11-10 DIAGNOSIS — M8580 Other specified disorders of bone density and structure, unspecified site: Secondary | ICD-10-CM | POA: Diagnosis not present

## 2017-11-10 DIAGNOSIS — J449 Chronic obstructive pulmonary disease, unspecified: Secondary | ICD-10-CM | POA: Diagnosis not present

## 2017-11-10 DIAGNOSIS — E1343 Other specified diabetes mellitus with diabetic autonomic (poly)neuropathy: Secondary | ICD-10-CM | POA: Diagnosis not present

## 2017-11-11 DIAGNOSIS — L97412 Non-pressure chronic ulcer of right heel and midfoot with fat layer exposed: Secondary | ICD-10-CM | POA: Diagnosis not present

## 2017-11-11 DIAGNOSIS — D509 Iron deficiency anemia, unspecified: Secondary | ICD-10-CM | POA: Diagnosis not present

## 2017-11-11 DIAGNOSIS — I509 Heart failure, unspecified: Secondary | ICD-10-CM | POA: Diagnosis not present

## 2017-11-11 DIAGNOSIS — Z7982 Long term (current) use of aspirin: Secondary | ICD-10-CM | POA: Diagnosis not present

## 2017-11-11 DIAGNOSIS — J449 Chronic obstructive pulmonary disease, unspecified: Secondary | ICD-10-CM | POA: Diagnosis not present

## 2017-11-11 DIAGNOSIS — Z87891 Personal history of nicotine dependence: Secondary | ICD-10-CM | POA: Diagnosis not present

## 2017-11-11 DIAGNOSIS — E11621 Type 2 diabetes mellitus with foot ulcer: Secondary | ICD-10-CM | POA: Diagnosis not present

## 2017-11-11 DIAGNOSIS — M10031 Idiopathic gout, right wrist: Secondary | ICD-10-CM | POA: Diagnosis not present

## 2017-11-11 DIAGNOSIS — E1142 Type 2 diabetes mellitus with diabetic polyneuropathy: Secondary | ICD-10-CM | POA: Diagnosis not present

## 2017-11-11 DIAGNOSIS — Z794 Long term (current) use of insulin: Secondary | ICD-10-CM | POA: Diagnosis not present

## 2017-11-11 DIAGNOSIS — M1991 Primary osteoarthritis, unspecified site: Secondary | ICD-10-CM | POA: Diagnosis not present

## 2017-11-11 DIAGNOSIS — I110 Hypertensive heart disease with heart failure: Secondary | ICD-10-CM | POA: Diagnosis not present

## 2017-11-16 DIAGNOSIS — Z7982 Long term (current) use of aspirin: Secondary | ICD-10-CM | POA: Diagnosis not present

## 2017-11-16 DIAGNOSIS — M1991 Primary osteoarthritis, unspecified site: Secondary | ICD-10-CM | POA: Diagnosis not present

## 2017-11-16 DIAGNOSIS — D509 Iron deficiency anemia, unspecified: Secondary | ICD-10-CM | POA: Diagnosis not present

## 2017-11-16 DIAGNOSIS — I110 Hypertensive heart disease with heart failure: Secondary | ICD-10-CM | POA: Diagnosis not present

## 2017-11-16 DIAGNOSIS — L97412 Non-pressure chronic ulcer of right heel and midfoot with fat layer exposed: Secondary | ICD-10-CM | POA: Diagnosis not present

## 2017-11-16 DIAGNOSIS — I509 Heart failure, unspecified: Secondary | ICD-10-CM | POA: Diagnosis not present

## 2017-11-16 DIAGNOSIS — J449 Chronic obstructive pulmonary disease, unspecified: Secondary | ICD-10-CM | POA: Diagnosis not present

## 2017-11-16 DIAGNOSIS — M10031 Idiopathic gout, right wrist: Secondary | ICD-10-CM | POA: Diagnosis not present

## 2017-11-16 DIAGNOSIS — Z794 Long term (current) use of insulin: Secondary | ICD-10-CM | POA: Diagnosis not present

## 2017-11-16 DIAGNOSIS — Z87891 Personal history of nicotine dependence: Secondary | ICD-10-CM | POA: Diagnosis not present

## 2017-11-16 DIAGNOSIS — E11621 Type 2 diabetes mellitus with foot ulcer: Secondary | ICD-10-CM | POA: Diagnosis not present

## 2017-11-16 DIAGNOSIS — E1142 Type 2 diabetes mellitus with diabetic polyneuropathy: Secondary | ICD-10-CM | POA: Diagnosis not present

## 2017-11-18 DIAGNOSIS — I110 Hypertensive heart disease with heart failure: Secondary | ICD-10-CM | POA: Diagnosis not present

## 2017-11-18 DIAGNOSIS — M1991 Primary osteoarthritis, unspecified site: Secondary | ICD-10-CM | POA: Diagnosis not present

## 2017-11-18 DIAGNOSIS — D509 Iron deficiency anemia, unspecified: Secondary | ICD-10-CM | POA: Diagnosis not present

## 2017-11-18 DIAGNOSIS — E11621 Type 2 diabetes mellitus with foot ulcer: Secondary | ICD-10-CM | POA: Diagnosis not present

## 2017-11-18 DIAGNOSIS — Z87891 Personal history of nicotine dependence: Secondary | ICD-10-CM | POA: Diagnosis not present

## 2017-11-18 DIAGNOSIS — Z7982 Long term (current) use of aspirin: Secondary | ICD-10-CM | POA: Diagnosis not present

## 2017-11-18 DIAGNOSIS — J449 Chronic obstructive pulmonary disease, unspecified: Secondary | ICD-10-CM | POA: Diagnosis not present

## 2017-11-18 DIAGNOSIS — I509 Heart failure, unspecified: Secondary | ICD-10-CM | POA: Diagnosis not present

## 2017-11-18 DIAGNOSIS — Z794 Long term (current) use of insulin: Secondary | ICD-10-CM | POA: Diagnosis not present

## 2017-11-18 DIAGNOSIS — L97412 Non-pressure chronic ulcer of right heel and midfoot with fat layer exposed: Secondary | ICD-10-CM | POA: Diagnosis not present

## 2017-11-18 DIAGNOSIS — E1142 Type 2 diabetes mellitus with diabetic polyneuropathy: Secondary | ICD-10-CM | POA: Diagnosis not present

## 2017-11-18 DIAGNOSIS — M10031 Idiopathic gout, right wrist: Secondary | ICD-10-CM | POA: Diagnosis not present

## 2017-11-23 DIAGNOSIS — Z794 Long term (current) use of insulin: Secondary | ICD-10-CM | POA: Diagnosis not present

## 2017-11-23 DIAGNOSIS — E1165 Type 2 diabetes mellitus with hyperglycemia: Secondary | ICD-10-CM | POA: Diagnosis not present

## 2017-11-23 DIAGNOSIS — Z87891 Personal history of nicotine dependence: Secondary | ICD-10-CM | POA: Diagnosis not present

## 2017-11-23 DIAGNOSIS — I10 Essential (primary) hypertension: Secondary | ICD-10-CM | POA: Diagnosis not present

## 2017-11-23 DIAGNOSIS — Z7982 Long term (current) use of aspirin: Secondary | ICD-10-CM | POA: Diagnosis not present

## 2017-11-23 DIAGNOSIS — I110 Hypertensive heart disease with heart failure: Secondary | ICD-10-CM | POA: Diagnosis not present

## 2017-11-23 DIAGNOSIS — E1142 Type 2 diabetes mellitus with diabetic polyneuropathy: Secondary | ICD-10-CM | POA: Diagnosis not present

## 2017-11-23 DIAGNOSIS — I509 Heart failure, unspecified: Secondary | ICD-10-CM | POA: Diagnosis not present

## 2017-11-23 DIAGNOSIS — E785 Hyperlipidemia, unspecified: Secondary | ICD-10-CM | POA: Diagnosis not present

## 2017-11-23 DIAGNOSIS — L97412 Non-pressure chronic ulcer of right heel and midfoot with fat layer exposed: Secondary | ICD-10-CM | POA: Diagnosis not present

## 2017-11-23 DIAGNOSIS — E11621 Type 2 diabetes mellitus with foot ulcer: Secondary | ICD-10-CM | POA: Diagnosis not present

## 2017-11-23 DIAGNOSIS — D509 Iron deficiency anemia, unspecified: Secondary | ICD-10-CM | POA: Diagnosis not present

## 2017-11-23 DIAGNOSIS — J449 Chronic obstructive pulmonary disease, unspecified: Secondary | ICD-10-CM | POA: Diagnosis not present

## 2017-11-23 DIAGNOSIS — M10031 Idiopathic gout, right wrist: Secondary | ICD-10-CM | POA: Diagnosis not present

## 2017-11-23 DIAGNOSIS — M1991 Primary osteoarthritis, unspecified site: Secondary | ICD-10-CM | POA: Diagnosis not present

## 2017-11-26 DIAGNOSIS — I110 Hypertensive heart disease with heart failure: Secondary | ICD-10-CM | POA: Diagnosis not present

## 2017-11-26 DIAGNOSIS — D509 Iron deficiency anemia, unspecified: Secondary | ICD-10-CM | POA: Diagnosis not present

## 2017-11-26 DIAGNOSIS — Z87891 Personal history of nicotine dependence: Secondary | ICD-10-CM | POA: Diagnosis not present

## 2017-11-26 DIAGNOSIS — Z794 Long term (current) use of insulin: Secondary | ICD-10-CM | POA: Diagnosis not present

## 2017-11-26 DIAGNOSIS — M10031 Idiopathic gout, right wrist: Secondary | ICD-10-CM | POA: Diagnosis not present

## 2017-11-26 DIAGNOSIS — E11621 Type 2 diabetes mellitus with foot ulcer: Secondary | ICD-10-CM | POA: Diagnosis not present

## 2017-11-26 DIAGNOSIS — I509 Heart failure, unspecified: Secondary | ICD-10-CM | POA: Diagnosis not present

## 2017-11-26 DIAGNOSIS — J449 Chronic obstructive pulmonary disease, unspecified: Secondary | ICD-10-CM | POA: Diagnosis not present

## 2017-11-26 DIAGNOSIS — M1991 Primary osteoarthritis, unspecified site: Secondary | ICD-10-CM | POA: Diagnosis not present

## 2017-11-26 DIAGNOSIS — L97412 Non-pressure chronic ulcer of right heel and midfoot with fat layer exposed: Secondary | ICD-10-CM | POA: Diagnosis not present

## 2017-11-26 DIAGNOSIS — E1142 Type 2 diabetes mellitus with diabetic polyneuropathy: Secondary | ICD-10-CM | POA: Diagnosis not present

## 2017-11-26 DIAGNOSIS — Z7982 Long term (current) use of aspirin: Secondary | ICD-10-CM | POA: Diagnosis not present

## 2017-11-30 DIAGNOSIS — Z7982 Long term (current) use of aspirin: Secondary | ICD-10-CM | POA: Diagnosis not present

## 2017-11-30 DIAGNOSIS — J449 Chronic obstructive pulmonary disease, unspecified: Secondary | ICD-10-CM | POA: Diagnosis not present

## 2017-11-30 DIAGNOSIS — E11621 Type 2 diabetes mellitus with foot ulcer: Secondary | ICD-10-CM | POA: Diagnosis not present

## 2017-11-30 DIAGNOSIS — M1991 Primary osteoarthritis, unspecified site: Secondary | ICD-10-CM | POA: Diagnosis not present

## 2017-11-30 DIAGNOSIS — Z87891 Personal history of nicotine dependence: Secondary | ICD-10-CM | POA: Diagnosis not present

## 2017-11-30 DIAGNOSIS — M10031 Idiopathic gout, right wrist: Secondary | ICD-10-CM | POA: Diagnosis not present

## 2017-11-30 DIAGNOSIS — E1142 Type 2 diabetes mellitus with diabetic polyneuropathy: Secondary | ICD-10-CM | POA: Diagnosis not present

## 2017-11-30 DIAGNOSIS — D509 Iron deficiency anemia, unspecified: Secondary | ICD-10-CM | POA: Diagnosis not present

## 2017-11-30 DIAGNOSIS — L97412 Non-pressure chronic ulcer of right heel and midfoot with fat layer exposed: Secondary | ICD-10-CM | POA: Diagnosis not present

## 2017-11-30 DIAGNOSIS — Z794 Long term (current) use of insulin: Secondary | ICD-10-CM | POA: Diagnosis not present

## 2017-11-30 DIAGNOSIS — I110 Hypertensive heart disease with heart failure: Secondary | ICD-10-CM | POA: Diagnosis not present

## 2017-11-30 DIAGNOSIS — I509 Heart failure, unspecified: Secondary | ICD-10-CM | POA: Diagnosis not present

## 2017-12-02 DIAGNOSIS — Z794 Long term (current) use of insulin: Secondary | ICD-10-CM | POA: Diagnosis not present

## 2017-12-02 DIAGNOSIS — D509 Iron deficiency anemia, unspecified: Secondary | ICD-10-CM | POA: Diagnosis not present

## 2017-12-02 DIAGNOSIS — M1991 Primary osteoarthritis, unspecified site: Secondary | ICD-10-CM | POA: Diagnosis not present

## 2017-12-02 DIAGNOSIS — I110 Hypertensive heart disease with heart failure: Secondary | ICD-10-CM | POA: Diagnosis not present

## 2017-12-02 DIAGNOSIS — L97412 Non-pressure chronic ulcer of right heel and midfoot with fat layer exposed: Secondary | ICD-10-CM | POA: Diagnosis not present

## 2017-12-02 DIAGNOSIS — E1142 Type 2 diabetes mellitus with diabetic polyneuropathy: Secondary | ICD-10-CM | POA: Diagnosis not present

## 2017-12-02 DIAGNOSIS — E11621 Type 2 diabetes mellitus with foot ulcer: Secondary | ICD-10-CM | POA: Diagnosis not present

## 2017-12-02 DIAGNOSIS — I509 Heart failure, unspecified: Secondary | ICD-10-CM | POA: Diagnosis not present

## 2017-12-02 DIAGNOSIS — M10031 Idiopathic gout, right wrist: Secondary | ICD-10-CM | POA: Diagnosis not present

## 2017-12-02 DIAGNOSIS — J449 Chronic obstructive pulmonary disease, unspecified: Secondary | ICD-10-CM | POA: Diagnosis not present

## 2017-12-02 DIAGNOSIS — Z87891 Personal history of nicotine dependence: Secondary | ICD-10-CM | POA: Diagnosis not present

## 2017-12-02 DIAGNOSIS — Z7982 Long term (current) use of aspirin: Secondary | ICD-10-CM | POA: Diagnosis not present

## 2017-12-06 DIAGNOSIS — J449 Chronic obstructive pulmonary disease, unspecified: Secondary | ICD-10-CM | POA: Diagnosis not present

## 2017-12-07 DIAGNOSIS — L97412 Non-pressure chronic ulcer of right heel and midfoot with fat layer exposed: Secondary | ICD-10-CM | POA: Diagnosis not present

## 2017-12-07 DIAGNOSIS — Z7982 Long term (current) use of aspirin: Secondary | ICD-10-CM | POA: Diagnosis not present

## 2017-12-07 DIAGNOSIS — M10031 Idiopathic gout, right wrist: Secondary | ICD-10-CM | POA: Diagnosis not present

## 2017-12-07 DIAGNOSIS — E11621 Type 2 diabetes mellitus with foot ulcer: Secondary | ICD-10-CM | POA: Diagnosis not present

## 2017-12-07 DIAGNOSIS — I110 Hypertensive heart disease with heart failure: Secondary | ICD-10-CM | POA: Diagnosis not present

## 2017-12-07 DIAGNOSIS — Z87891 Personal history of nicotine dependence: Secondary | ICD-10-CM | POA: Diagnosis not present

## 2017-12-07 DIAGNOSIS — Z794 Long term (current) use of insulin: Secondary | ICD-10-CM | POA: Diagnosis not present

## 2017-12-07 DIAGNOSIS — E1142 Type 2 diabetes mellitus with diabetic polyneuropathy: Secondary | ICD-10-CM | POA: Diagnosis not present

## 2017-12-07 DIAGNOSIS — D509 Iron deficiency anemia, unspecified: Secondary | ICD-10-CM | POA: Diagnosis not present

## 2017-12-07 DIAGNOSIS — J449 Chronic obstructive pulmonary disease, unspecified: Secondary | ICD-10-CM | POA: Diagnosis not present

## 2017-12-07 DIAGNOSIS — I509 Heart failure, unspecified: Secondary | ICD-10-CM | POA: Diagnosis not present

## 2017-12-07 DIAGNOSIS — M1991 Primary osteoarthritis, unspecified site: Secondary | ICD-10-CM | POA: Diagnosis not present

## 2017-12-09 DIAGNOSIS — Z794 Long term (current) use of insulin: Secondary | ICD-10-CM | POA: Diagnosis not present

## 2017-12-09 DIAGNOSIS — E11621 Type 2 diabetes mellitus with foot ulcer: Secondary | ICD-10-CM | POA: Diagnosis not present

## 2017-12-09 DIAGNOSIS — I110 Hypertensive heart disease with heart failure: Secondary | ICD-10-CM | POA: Diagnosis not present

## 2017-12-09 DIAGNOSIS — D509 Iron deficiency anemia, unspecified: Secondary | ICD-10-CM | POA: Diagnosis not present

## 2017-12-09 DIAGNOSIS — L97412 Non-pressure chronic ulcer of right heel and midfoot with fat layer exposed: Secondary | ICD-10-CM | POA: Diagnosis not present

## 2017-12-09 DIAGNOSIS — J449 Chronic obstructive pulmonary disease, unspecified: Secondary | ICD-10-CM | POA: Diagnosis not present

## 2017-12-09 DIAGNOSIS — M1991 Primary osteoarthritis, unspecified site: Secondary | ICD-10-CM | POA: Diagnosis not present

## 2017-12-09 DIAGNOSIS — M10031 Idiopathic gout, right wrist: Secondary | ICD-10-CM | POA: Diagnosis not present

## 2017-12-09 DIAGNOSIS — Z87891 Personal history of nicotine dependence: Secondary | ICD-10-CM | POA: Diagnosis not present

## 2017-12-09 DIAGNOSIS — I509 Heart failure, unspecified: Secondary | ICD-10-CM | POA: Diagnosis not present

## 2017-12-09 DIAGNOSIS — Z7982 Long term (current) use of aspirin: Secondary | ICD-10-CM | POA: Diagnosis not present

## 2017-12-09 DIAGNOSIS — E1142 Type 2 diabetes mellitus with diabetic polyneuropathy: Secondary | ICD-10-CM | POA: Diagnosis not present

## 2017-12-14 DIAGNOSIS — I878 Other specified disorders of veins: Secondary | ICD-10-CM | POA: Diagnosis not present

## 2017-12-14 DIAGNOSIS — L97412 Non-pressure chronic ulcer of right heel and midfoot with fat layer exposed: Secondary | ICD-10-CM | POA: Diagnosis not present

## 2017-12-14 DIAGNOSIS — I872 Venous insufficiency (chronic) (peripheral): Secondary | ICD-10-CM | POA: Diagnosis not present

## 2017-12-17 DIAGNOSIS — J449 Chronic obstructive pulmonary disease, unspecified: Secondary | ICD-10-CM | POA: Diagnosis not present

## 2017-12-17 DIAGNOSIS — E11621 Type 2 diabetes mellitus with foot ulcer: Secondary | ICD-10-CM | POA: Diagnosis not present

## 2017-12-17 DIAGNOSIS — Z794 Long term (current) use of insulin: Secondary | ICD-10-CM | POA: Diagnosis not present

## 2017-12-17 DIAGNOSIS — E1142 Type 2 diabetes mellitus with diabetic polyneuropathy: Secondary | ICD-10-CM | POA: Diagnosis not present

## 2017-12-17 DIAGNOSIS — M10031 Idiopathic gout, right wrist: Secondary | ICD-10-CM | POA: Diagnosis not present

## 2017-12-17 DIAGNOSIS — L97412 Non-pressure chronic ulcer of right heel and midfoot with fat layer exposed: Secondary | ICD-10-CM | POA: Diagnosis not present

## 2017-12-17 DIAGNOSIS — Z87891 Personal history of nicotine dependence: Secondary | ICD-10-CM | POA: Diagnosis not present

## 2017-12-17 DIAGNOSIS — I509 Heart failure, unspecified: Secondary | ICD-10-CM | POA: Diagnosis not present

## 2017-12-17 DIAGNOSIS — D509 Iron deficiency anemia, unspecified: Secondary | ICD-10-CM | POA: Diagnosis not present

## 2017-12-17 DIAGNOSIS — M1991 Primary osteoarthritis, unspecified site: Secondary | ICD-10-CM | POA: Diagnosis not present

## 2017-12-17 DIAGNOSIS — I110 Hypertensive heart disease with heart failure: Secondary | ICD-10-CM | POA: Diagnosis not present

## 2017-12-17 DIAGNOSIS — Z7982 Long term (current) use of aspirin: Secondary | ICD-10-CM | POA: Diagnosis not present

## 2017-12-21 DIAGNOSIS — I509 Heart failure, unspecified: Secondary | ICD-10-CM | POA: Diagnosis not present

## 2017-12-21 DIAGNOSIS — J449 Chronic obstructive pulmonary disease, unspecified: Secondary | ICD-10-CM | POA: Diagnosis not present

## 2017-12-21 DIAGNOSIS — M10031 Idiopathic gout, right wrist: Secondary | ICD-10-CM | POA: Diagnosis not present

## 2017-12-21 DIAGNOSIS — E11621 Type 2 diabetes mellitus with foot ulcer: Secondary | ICD-10-CM | POA: Diagnosis not present

## 2017-12-21 DIAGNOSIS — Z794 Long term (current) use of insulin: Secondary | ICD-10-CM | POA: Diagnosis not present

## 2017-12-21 DIAGNOSIS — D509 Iron deficiency anemia, unspecified: Secondary | ICD-10-CM | POA: Diagnosis not present

## 2017-12-21 DIAGNOSIS — E1142 Type 2 diabetes mellitus with diabetic polyneuropathy: Secondary | ICD-10-CM | POA: Diagnosis not present

## 2017-12-21 DIAGNOSIS — I110 Hypertensive heart disease with heart failure: Secondary | ICD-10-CM | POA: Diagnosis not present

## 2017-12-21 DIAGNOSIS — Z7982 Long term (current) use of aspirin: Secondary | ICD-10-CM | POA: Diagnosis not present

## 2017-12-21 DIAGNOSIS — L97412 Non-pressure chronic ulcer of right heel and midfoot with fat layer exposed: Secondary | ICD-10-CM | POA: Diagnosis not present

## 2017-12-21 DIAGNOSIS — M1991 Primary osteoarthritis, unspecified site: Secondary | ICD-10-CM | POA: Diagnosis not present

## 2017-12-21 DIAGNOSIS — Z87891 Personal history of nicotine dependence: Secondary | ICD-10-CM | POA: Diagnosis not present

## 2017-12-23 DIAGNOSIS — M10031 Idiopathic gout, right wrist: Secondary | ICD-10-CM | POA: Diagnosis not present

## 2017-12-23 DIAGNOSIS — L97412 Non-pressure chronic ulcer of right heel and midfoot with fat layer exposed: Secondary | ICD-10-CM | POA: Diagnosis not present

## 2017-12-23 DIAGNOSIS — M1991 Primary osteoarthritis, unspecified site: Secondary | ICD-10-CM | POA: Diagnosis not present

## 2017-12-23 DIAGNOSIS — I509 Heart failure, unspecified: Secondary | ICD-10-CM | POA: Diagnosis not present

## 2017-12-23 DIAGNOSIS — E1142 Type 2 diabetes mellitus with diabetic polyneuropathy: Secondary | ICD-10-CM | POA: Diagnosis not present

## 2017-12-23 DIAGNOSIS — Z87891 Personal history of nicotine dependence: Secondary | ICD-10-CM | POA: Diagnosis not present

## 2017-12-23 DIAGNOSIS — E11621 Type 2 diabetes mellitus with foot ulcer: Secondary | ICD-10-CM | POA: Diagnosis not present

## 2017-12-23 DIAGNOSIS — D509 Iron deficiency anemia, unspecified: Secondary | ICD-10-CM | POA: Diagnosis not present

## 2017-12-23 DIAGNOSIS — Z7982 Long term (current) use of aspirin: Secondary | ICD-10-CM | POA: Diagnosis not present

## 2017-12-23 DIAGNOSIS — Z794 Long term (current) use of insulin: Secondary | ICD-10-CM | POA: Diagnosis not present

## 2017-12-23 DIAGNOSIS — I110 Hypertensive heart disease with heart failure: Secondary | ICD-10-CM | POA: Diagnosis not present

## 2017-12-23 DIAGNOSIS — J449 Chronic obstructive pulmonary disease, unspecified: Secondary | ICD-10-CM | POA: Diagnosis not present

## 2017-12-28 DIAGNOSIS — D509 Iron deficiency anemia, unspecified: Secondary | ICD-10-CM | POA: Diagnosis not present

## 2017-12-28 DIAGNOSIS — Z87891 Personal history of nicotine dependence: Secondary | ICD-10-CM | POA: Diagnosis not present

## 2017-12-28 DIAGNOSIS — J449 Chronic obstructive pulmonary disease, unspecified: Secondary | ICD-10-CM | POA: Diagnosis not present

## 2017-12-28 DIAGNOSIS — Z794 Long term (current) use of insulin: Secondary | ICD-10-CM | POA: Diagnosis not present

## 2017-12-28 DIAGNOSIS — M1991 Primary osteoarthritis, unspecified site: Secondary | ICD-10-CM | POA: Diagnosis not present

## 2017-12-28 DIAGNOSIS — I509 Heart failure, unspecified: Secondary | ICD-10-CM | POA: Diagnosis not present

## 2017-12-28 DIAGNOSIS — I110 Hypertensive heart disease with heart failure: Secondary | ICD-10-CM | POA: Diagnosis not present

## 2017-12-28 DIAGNOSIS — E1142 Type 2 diabetes mellitus with diabetic polyneuropathy: Secondary | ICD-10-CM | POA: Diagnosis not present

## 2017-12-28 DIAGNOSIS — M10031 Idiopathic gout, right wrist: Secondary | ICD-10-CM | POA: Diagnosis not present

## 2017-12-28 DIAGNOSIS — Z7982 Long term (current) use of aspirin: Secondary | ICD-10-CM | POA: Diagnosis not present

## 2017-12-28 DIAGNOSIS — E11621 Type 2 diabetes mellitus with foot ulcer: Secondary | ICD-10-CM | POA: Diagnosis not present

## 2017-12-28 DIAGNOSIS — L97412 Non-pressure chronic ulcer of right heel and midfoot with fat layer exposed: Secondary | ICD-10-CM | POA: Diagnosis not present

## 2018-01-04 DIAGNOSIS — D509 Iron deficiency anemia, unspecified: Secondary | ICD-10-CM | POA: Diagnosis not present

## 2018-01-04 DIAGNOSIS — J449 Chronic obstructive pulmonary disease, unspecified: Secondary | ICD-10-CM | POA: Diagnosis not present

## 2018-01-04 DIAGNOSIS — M1991 Primary osteoarthritis, unspecified site: Secondary | ICD-10-CM | POA: Diagnosis not present

## 2018-01-04 DIAGNOSIS — Z87891 Personal history of nicotine dependence: Secondary | ICD-10-CM | POA: Diagnosis not present

## 2018-01-04 DIAGNOSIS — E1142 Type 2 diabetes mellitus with diabetic polyneuropathy: Secondary | ICD-10-CM | POA: Diagnosis not present

## 2018-01-04 DIAGNOSIS — L97412 Non-pressure chronic ulcer of right heel and midfoot with fat layer exposed: Secondary | ICD-10-CM | POA: Diagnosis not present

## 2018-01-04 DIAGNOSIS — I110 Hypertensive heart disease with heart failure: Secondary | ICD-10-CM | POA: Diagnosis not present

## 2018-01-04 DIAGNOSIS — E11621 Type 2 diabetes mellitus with foot ulcer: Secondary | ICD-10-CM | POA: Diagnosis not present

## 2018-01-04 DIAGNOSIS — M10031 Idiopathic gout, right wrist: Secondary | ICD-10-CM | POA: Diagnosis not present

## 2018-01-04 DIAGNOSIS — Z794 Long term (current) use of insulin: Secondary | ICD-10-CM | POA: Diagnosis not present

## 2018-01-04 DIAGNOSIS — I509 Heart failure, unspecified: Secondary | ICD-10-CM | POA: Diagnosis not present

## 2018-01-04 DIAGNOSIS — Z7982 Long term (current) use of aspirin: Secondary | ICD-10-CM | POA: Diagnosis not present

## 2018-01-05 DIAGNOSIS — J449 Chronic obstructive pulmonary disease, unspecified: Secondary | ICD-10-CM | POA: Diagnosis not present

## 2018-01-11 DIAGNOSIS — Z7982 Long term (current) use of aspirin: Secondary | ICD-10-CM | POA: Diagnosis not present

## 2018-01-11 DIAGNOSIS — D509 Iron deficiency anemia, unspecified: Secondary | ICD-10-CM | POA: Diagnosis not present

## 2018-01-11 DIAGNOSIS — L97412 Non-pressure chronic ulcer of right heel and midfoot with fat layer exposed: Secondary | ICD-10-CM | POA: Diagnosis not present

## 2018-01-11 DIAGNOSIS — M1991 Primary osteoarthritis, unspecified site: Secondary | ICD-10-CM | POA: Diagnosis not present

## 2018-01-11 DIAGNOSIS — I509 Heart failure, unspecified: Secondary | ICD-10-CM | POA: Diagnosis not present

## 2018-01-11 DIAGNOSIS — Z87891 Personal history of nicotine dependence: Secondary | ICD-10-CM | POA: Diagnosis not present

## 2018-01-11 DIAGNOSIS — Z794 Long term (current) use of insulin: Secondary | ICD-10-CM | POA: Diagnosis not present

## 2018-01-11 DIAGNOSIS — M10031 Idiopathic gout, right wrist: Secondary | ICD-10-CM | POA: Diagnosis not present

## 2018-01-11 DIAGNOSIS — J449 Chronic obstructive pulmonary disease, unspecified: Secondary | ICD-10-CM | POA: Diagnosis not present

## 2018-01-11 DIAGNOSIS — E11621 Type 2 diabetes mellitus with foot ulcer: Secondary | ICD-10-CM | POA: Diagnosis not present

## 2018-01-11 DIAGNOSIS — E1142 Type 2 diabetes mellitus with diabetic polyneuropathy: Secondary | ICD-10-CM | POA: Diagnosis not present

## 2018-01-11 DIAGNOSIS — I110 Hypertensive heart disease with heart failure: Secondary | ICD-10-CM | POA: Diagnosis not present

## 2018-01-17 DIAGNOSIS — Z7982 Long term (current) use of aspirin: Secondary | ICD-10-CM | POA: Diagnosis not present

## 2018-01-17 DIAGNOSIS — Z794 Long term (current) use of insulin: Secondary | ICD-10-CM | POA: Diagnosis not present

## 2018-01-17 DIAGNOSIS — E1142 Type 2 diabetes mellitus with diabetic polyneuropathy: Secondary | ICD-10-CM | POA: Diagnosis not present

## 2018-01-17 DIAGNOSIS — I110 Hypertensive heart disease with heart failure: Secondary | ICD-10-CM | POA: Diagnosis not present

## 2018-01-17 DIAGNOSIS — M10031 Idiopathic gout, right wrist: Secondary | ICD-10-CM | POA: Diagnosis not present

## 2018-01-17 DIAGNOSIS — M1991 Primary osteoarthritis, unspecified site: Secondary | ICD-10-CM | POA: Diagnosis not present

## 2018-01-17 DIAGNOSIS — L97412 Non-pressure chronic ulcer of right heel and midfoot with fat layer exposed: Secondary | ICD-10-CM | POA: Diagnosis not present

## 2018-01-17 DIAGNOSIS — I509 Heart failure, unspecified: Secondary | ICD-10-CM | POA: Diagnosis not present

## 2018-01-17 DIAGNOSIS — J449 Chronic obstructive pulmonary disease, unspecified: Secondary | ICD-10-CM | POA: Diagnosis not present

## 2018-01-17 DIAGNOSIS — D509 Iron deficiency anemia, unspecified: Secondary | ICD-10-CM | POA: Diagnosis not present

## 2018-01-17 DIAGNOSIS — Z87891 Personal history of nicotine dependence: Secondary | ICD-10-CM | POA: Diagnosis not present

## 2018-01-17 DIAGNOSIS — E11621 Type 2 diabetes mellitus with foot ulcer: Secondary | ICD-10-CM | POA: Diagnosis not present

## 2018-01-24 DIAGNOSIS — L97412 Non-pressure chronic ulcer of right heel and midfoot with fat layer exposed: Secondary | ICD-10-CM | POA: Diagnosis not present

## 2018-01-24 DIAGNOSIS — I872 Venous insufficiency (chronic) (peripheral): Secondary | ICD-10-CM | POA: Diagnosis not present

## 2018-01-24 DIAGNOSIS — I878 Other specified disorders of veins: Secondary | ICD-10-CM | POA: Diagnosis not present

## 2018-01-25 DIAGNOSIS — M10031 Idiopathic gout, right wrist: Secondary | ICD-10-CM | POA: Diagnosis not present

## 2018-01-25 DIAGNOSIS — I110 Hypertensive heart disease with heart failure: Secondary | ICD-10-CM | POA: Diagnosis not present

## 2018-01-25 DIAGNOSIS — M1991 Primary osteoarthritis, unspecified site: Secondary | ICD-10-CM | POA: Diagnosis not present

## 2018-01-25 DIAGNOSIS — I509 Heart failure, unspecified: Secondary | ICD-10-CM | POA: Diagnosis not present

## 2018-01-25 DIAGNOSIS — Z87891 Personal history of nicotine dependence: Secondary | ICD-10-CM | POA: Diagnosis not present

## 2018-01-25 DIAGNOSIS — D509 Iron deficiency anemia, unspecified: Secondary | ICD-10-CM | POA: Diagnosis not present

## 2018-01-25 DIAGNOSIS — Z794 Long term (current) use of insulin: Secondary | ICD-10-CM | POA: Diagnosis not present

## 2018-01-25 DIAGNOSIS — E1142 Type 2 diabetes mellitus with diabetic polyneuropathy: Secondary | ICD-10-CM | POA: Diagnosis not present

## 2018-01-25 DIAGNOSIS — E11621 Type 2 diabetes mellitus with foot ulcer: Secondary | ICD-10-CM | POA: Diagnosis not present

## 2018-01-25 DIAGNOSIS — J449 Chronic obstructive pulmonary disease, unspecified: Secondary | ICD-10-CM | POA: Diagnosis not present

## 2018-01-25 DIAGNOSIS — Z7982 Long term (current) use of aspirin: Secondary | ICD-10-CM | POA: Diagnosis not present

## 2018-01-25 DIAGNOSIS — L97412 Non-pressure chronic ulcer of right heel and midfoot with fat layer exposed: Secondary | ICD-10-CM | POA: Diagnosis not present

## 2018-01-31 DIAGNOSIS — J449 Chronic obstructive pulmonary disease, unspecified: Secondary | ICD-10-CM | POA: Diagnosis not present

## 2018-01-31 DIAGNOSIS — Z7982 Long term (current) use of aspirin: Secondary | ICD-10-CM | POA: Diagnosis not present

## 2018-01-31 DIAGNOSIS — M1991 Primary osteoarthritis, unspecified site: Secondary | ICD-10-CM | POA: Diagnosis not present

## 2018-01-31 DIAGNOSIS — I110 Hypertensive heart disease with heart failure: Secondary | ICD-10-CM | POA: Diagnosis not present

## 2018-01-31 DIAGNOSIS — Z87891 Personal history of nicotine dependence: Secondary | ICD-10-CM | POA: Diagnosis not present

## 2018-01-31 DIAGNOSIS — E1142 Type 2 diabetes mellitus with diabetic polyneuropathy: Secondary | ICD-10-CM | POA: Diagnosis not present

## 2018-01-31 DIAGNOSIS — E11621 Type 2 diabetes mellitus with foot ulcer: Secondary | ICD-10-CM | POA: Diagnosis not present

## 2018-01-31 DIAGNOSIS — D509 Iron deficiency anemia, unspecified: Secondary | ICD-10-CM | POA: Diagnosis not present

## 2018-01-31 DIAGNOSIS — Z794 Long term (current) use of insulin: Secondary | ICD-10-CM | POA: Diagnosis not present

## 2018-01-31 DIAGNOSIS — I509 Heart failure, unspecified: Secondary | ICD-10-CM | POA: Diagnosis not present

## 2018-01-31 DIAGNOSIS — L97412 Non-pressure chronic ulcer of right heel and midfoot with fat layer exposed: Secondary | ICD-10-CM | POA: Diagnosis not present

## 2018-01-31 DIAGNOSIS — M10031 Idiopathic gout, right wrist: Secondary | ICD-10-CM | POA: Diagnosis not present

## 2018-02-05 DIAGNOSIS — J449 Chronic obstructive pulmonary disease, unspecified: Secondary | ICD-10-CM | POA: Diagnosis not present

## 2018-02-07 DIAGNOSIS — E119 Type 2 diabetes mellitus without complications: Secondary | ICD-10-CM | POA: Diagnosis not present

## 2018-02-07 DIAGNOSIS — Z0100 Encounter for examination of eyes and vision without abnormal findings: Secondary | ICD-10-CM | POA: Diagnosis not present

## 2018-02-10 DIAGNOSIS — M8580 Other specified disorders of bone density and structure, unspecified site: Secondary | ICD-10-CM | POA: Diagnosis not present

## 2018-02-10 DIAGNOSIS — I10 Essential (primary) hypertension: Secondary | ICD-10-CM | POA: Diagnosis not present

## 2018-02-10 DIAGNOSIS — Z23 Encounter for immunization: Secondary | ICD-10-CM | POA: Diagnosis not present

## 2018-02-10 DIAGNOSIS — E1343 Other specified diabetes mellitus with diabetic autonomic (poly)neuropathy: Secondary | ICD-10-CM | POA: Diagnosis not present

## 2018-02-10 DIAGNOSIS — J449 Chronic obstructive pulmonary disease, unspecified: Secondary | ICD-10-CM | POA: Diagnosis not present

## 2018-02-10 DIAGNOSIS — E785 Hyperlipidemia, unspecified: Secondary | ICD-10-CM | POA: Diagnosis not present

## 2018-02-10 DIAGNOSIS — E119 Type 2 diabetes mellitus without complications: Secondary | ICD-10-CM | POA: Diagnosis not present

## 2018-03-07 DIAGNOSIS — J449 Chronic obstructive pulmonary disease, unspecified: Secondary | ICD-10-CM | POA: Diagnosis not present

## 2018-04-01 DIAGNOSIS — N390 Urinary tract infection, site not specified: Secondary | ICD-10-CM | POA: Diagnosis not present

## 2018-04-01 DIAGNOSIS — J449 Chronic obstructive pulmonary disease, unspecified: Secondary | ICD-10-CM | POA: Diagnosis not present

## 2018-04-01 DIAGNOSIS — E1343 Other specified diabetes mellitus with diabetic autonomic (poly)neuropathy: Secondary | ICD-10-CM | POA: Diagnosis not present

## 2018-04-01 DIAGNOSIS — E785 Hyperlipidemia, unspecified: Secondary | ICD-10-CM | POA: Diagnosis not present

## 2018-04-07 DIAGNOSIS — J449 Chronic obstructive pulmonary disease, unspecified: Secondary | ICD-10-CM | POA: Diagnosis not present

## 2018-04-25 DIAGNOSIS — L309 Dermatitis, unspecified: Secondary | ICD-10-CM | POA: Diagnosis not present

## 2018-04-25 DIAGNOSIS — E1343 Other specified diabetes mellitus with diabetic autonomic (poly)neuropathy: Secondary | ICD-10-CM | POA: Diagnosis not present

## 2018-04-25 DIAGNOSIS — J449 Chronic obstructive pulmonary disease, unspecified: Secondary | ICD-10-CM | POA: Diagnosis not present

## 2018-04-25 DIAGNOSIS — E785 Hyperlipidemia, unspecified: Secondary | ICD-10-CM | POA: Diagnosis not present

## 2018-04-25 DIAGNOSIS — R609 Edema, unspecified: Secondary | ICD-10-CM | POA: Diagnosis not present

## 2018-05-04 DIAGNOSIS — D509 Iron deficiency anemia, unspecified: Secondary | ICD-10-CM | POA: Diagnosis not present

## 2018-05-08 DIAGNOSIS — J449 Chronic obstructive pulmonary disease, unspecified: Secondary | ICD-10-CM | POA: Diagnosis not present

## 2018-05-17 DIAGNOSIS — I8310 Varicose veins of unspecified lower extremity with inflammation: Secondary | ICD-10-CM | POA: Diagnosis not present

## 2018-05-17 DIAGNOSIS — L309 Dermatitis, unspecified: Secondary | ICD-10-CM | POA: Diagnosis not present

## 2018-05-18 DIAGNOSIS — M8580 Other specified disorders of bone density and structure, unspecified site: Secondary | ICD-10-CM | POA: Diagnosis not present

## 2018-05-18 DIAGNOSIS — E1343 Other specified diabetes mellitus with diabetic autonomic (poly)neuropathy: Secondary | ICD-10-CM | POA: Diagnosis not present

## 2018-05-18 DIAGNOSIS — J449 Chronic obstructive pulmonary disease, unspecified: Secondary | ICD-10-CM | POA: Diagnosis not present

## 2018-05-18 DIAGNOSIS — E785 Hyperlipidemia, unspecified: Secondary | ICD-10-CM | POA: Diagnosis not present

## 2018-05-18 DIAGNOSIS — R609 Edema, unspecified: Secondary | ICD-10-CM | POA: Diagnosis not present

## 2018-05-19 DIAGNOSIS — E785 Hyperlipidemia, unspecified: Secondary | ICD-10-CM | POA: Diagnosis not present

## 2018-05-19 DIAGNOSIS — I509 Heart failure, unspecified: Secondary | ICD-10-CM | POA: Diagnosis not present

## 2018-05-19 DIAGNOSIS — I4510 Unspecified right bundle-branch block: Secondary | ICD-10-CM | POA: Diagnosis not present

## 2018-05-19 DIAGNOSIS — J449 Chronic obstructive pulmonary disease, unspecified: Secondary | ICD-10-CM | POA: Diagnosis not present

## 2018-05-19 DIAGNOSIS — Z87891 Personal history of nicotine dependence: Secondary | ICD-10-CM | POA: Diagnosis not present

## 2018-05-19 DIAGNOSIS — M159 Polyosteoarthritis, unspecified: Secondary | ICD-10-CM | POA: Diagnosis not present

## 2018-05-19 DIAGNOSIS — K219 Gastro-esophageal reflux disease without esophagitis: Secondary | ICD-10-CM | POA: Diagnosis not present

## 2018-05-19 DIAGNOSIS — M109 Gout, unspecified: Secondary | ICD-10-CM | POA: Diagnosis not present

## 2018-05-19 DIAGNOSIS — E1151 Type 2 diabetes mellitus with diabetic peripheral angiopathy without gangrene: Secondary | ICD-10-CM | POA: Diagnosis not present

## 2018-05-19 DIAGNOSIS — Z7982 Long term (current) use of aspirin: Secondary | ICD-10-CM | POA: Diagnosis not present

## 2018-05-19 DIAGNOSIS — M8580 Other specified disorders of bone density and structure, unspecified site: Secondary | ICD-10-CM | POA: Diagnosis not present

## 2018-05-19 DIAGNOSIS — J9611 Chronic respiratory failure with hypoxia: Secondary | ICD-10-CM | POA: Diagnosis not present

## 2018-05-19 DIAGNOSIS — Z9981 Dependence on supplemental oxygen: Secondary | ICD-10-CM | POA: Diagnosis not present

## 2018-05-19 DIAGNOSIS — I110 Hypertensive heart disease with heart failure: Secondary | ICD-10-CM | POA: Diagnosis not present

## 2018-05-19 DIAGNOSIS — E1142 Type 2 diabetes mellitus with diabetic polyneuropathy: Secondary | ICD-10-CM | POA: Diagnosis not present

## 2018-05-19 DIAGNOSIS — D509 Iron deficiency anemia, unspecified: Secondary | ICD-10-CM | POA: Diagnosis not present

## 2018-05-19 DIAGNOSIS — K760 Fatty (change of) liver, not elsewhere classified: Secondary | ICD-10-CM | POA: Diagnosis not present

## 2018-05-19 DIAGNOSIS — E1165 Type 2 diabetes mellitus with hyperglycemia: Secondary | ICD-10-CM | POA: Diagnosis not present

## 2018-05-19 DIAGNOSIS — Z794 Long term (current) use of insulin: Secondary | ICD-10-CM | POA: Diagnosis not present

## 2018-05-23 DIAGNOSIS — E1165 Type 2 diabetes mellitus with hyperglycemia: Secondary | ICD-10-CM | POA: Diagnosis not present

## 2018-05-23 DIAGNOSIS — Z9981 Dependence on supplemental oxygen: Secondary | ICD-10-CM | POA: Diagnosis not present

## 2018-05-23 DIAGNOSIS — K760 Fatty (change of) liver, not elsewhere classified: Secondary | ICD-10-CM | POA: Diagnosis not present

## 2018-05-23 DIAGNOSIS — Z7982 Long term (current) use of aspirin: Secondary | ICD-10-CM | POA: Diagnosis not present

## 2018-05-23 DIAGNOSIS — I509 Heart failure, unspecified: Secondary | ICD-10-CM | POA: Diagnosis not present

## 2018-05-23 DIAGNOSIS — M109 Gout, unspecified: Secondary | ICD-10-CM | POA: Diagnosis not present

## 2018-05-23 DIAGNOSIS — M8580 Other specified disorders of bone density and structure, unspecified site: Secondary | ICD-10-CM | POA: Diagnosis not present

## 2018-05-23 DIAGNOSIS — E785 Hyperlipidemia, unspecified: Secondary | ICD-10-CM | POA: Diagnosis not present

## 2018-05-23 DIAGNOSIS — D509 Iron deficiency anemia, unspecified: Secondary | ICD-10-CM | POA: Diagnosis not present

## 2018-05-23 DIAGNOSIS — I4510 Unspecified right bundle-branch block: Secondary | ICD-10-CM | POA: Diagnosis not present

## 2018-05-23 DIAGNOSIS — Z87891 Personal history of nicotine dependence: Secondary | ICD-10-CM | POA: Diagnosis not present

## 2018-05-23 DIAGNOSIS — E1151 Type 2 diabetes mellitus with diabetic peripheral angiopathy without gangrene: Secondary | ICD-10-CM | POA: Diagnosis not present

## 2018-05-23 DIAGNOSIS — I110 Hypertensive heart disease with heart failure: Secondary | ICD-10-CM | POA: Diagnosis not present

## 2018-05-23 DIAGNOSIS — K219 Gastro-esophageal reflux disease without esophagitis: Secondary | ICD-10-CM | POA: Diagnosis not present

## 2018-05-23 DIAGNOSIS — M159 Polyosteoarthritis, unspecified: Secondary | ICD-10-CM | POA: Diagnosis not present

## 2018-05-23 DIAGNOSIS — J9611 Chronic respiratory failure with hypoxia: Secondary | ICD-10-CM | POA: Diagnosis not present

## 2018-05-23 DIAGNOSIS — J449 Chronic obstructive pulmonary disease, unspecified: Secondary | ICD-10-CM | POA: Diagnosis not present

## 2018-05-23 DIAGNOSIS — Z794 Long term (current) use of insulin: Secondary | ICD-10-CM | POA: Diagnosis not present

## 2018-05-23 DIAGNOSIS — E1142 Type 2 diabetes mellitus with diabetic polyneuropathy: Secondary | ICD-10-CM | POA: Diagnosis not present

## 2018-05-26 DIAGNOSIS — E1142 Type 2 diabetes mellitus with diabetic polyneuropathy: Secondary | ICD-10-CM | POA: Diagnosis not present

## 2018-05-26 DIAGNOSIS — Z87891 Personal history of nicotine dependence: Secondary | ICD-10-CM | POA: Diagnosis not present

## 2018-05-26 DIAGNOSIS — E1165 Type 2 diabetes mellitus with hyperglycemia: Secondary | ICD-10-CM | POA: Diagnosis not present

## 2018-05-26 DIAGNOSIS — M159 Polyosteoarthritis, unspecified: Secondary | ICD-10-CM | POA: Diagnosis not present

## 2018-05-26 DIAGNOSIS — Z794 Long term (current) use of insulin: Secondary | ICD-10-CM | POA: Diagnosis not present

## 2018-05-26 DIAGNOSIS — J9611 Chronic respiratory failure with hypoxia: Secondary | ICD-10-CM | POA: Diagnosis not present

## 2018-05-26 DIAGNOSIS — I509 Heart failure, unspecified: Secondary | ICD-10-CM | POA: Diagnosis not present

## 2018-05-26 DIAGNOSIS — I110 Hypertensive heart disease with heart failure: Secondary | ICD-10-CM | POA: Diagnosis not present

## 2018-05-26 DIAGNOSIS — D509 Iron deficiency anemia, unspecified: Secondary | ICD-10-CM | POA: Diagnosis not present

## 2018-05-26 DIAGNOSIS — E1151 Type 2 diabetes mellitus with diabetic peripheral angiopathy without gangrene: Secondary | ICD-10-CM | POA: Diagnosis not present

## 2018-05-26 DIAGNOSIS — K760 Fatty (change of) liver, not elsewhere classified: Secondary | ICD-10-CM | POA: Diagnosis not present

## 2018-05-26 DIAGNOSIS — M109 Gout, unspecified: Secondary | ICD-10-CM | POA: Diagnosis not present

## 2018-05-26 DIAGNOSIS — E785 Hyperlipidemia, unspecified: Secondary | ICD-10-CM | POA: Diagnosis not present

## 2018-05-26 DIAGNOSIS — Z9981 Dependence on supplemental oxygen: Secondary | ICD-10-CM | POA: Diagnosis not present

## 2018-05-26 DIAGNOSIS — M8580 Other specified disorders of bone density and structure, unspecified site: Secondary | ICD-10-CM | POA: Diagnosis not present

## 2018-05-26 DIAGNOSIS — I4510 Unspecified right bundle-branch block: Secondary | ICD-10-CM | POA: Diagnosis not present

## 2018-05-26 DIAGNOSIS — K219 Gastro-esophageal reflux disease without esophagitis: Secondary | ICD-10-CM | POA: Diagnosis not present

## 2018-05-26 DIAGNOSIS — Z7982 Long term (current) use of aspirin: Secondary | ICD-10-CM | POA: Diagnosis not present

## 2018-05-26 DIAGNOSIS — J449 Chronic obstructive pulmonary disease, unspecified: Secondary | ICD-10-CM | POA: Diagnosis not present

## 2018-05-30 DIAGNOSIS — I509 Heart failure, unspecified: Secondary | ICD-10-CM | POA: Diagnosis not present

## 2018-05-30 DIAGNOSIS — E1142 Type 2 diabetes mellitus with diabetic polyneuropathy: Secondary | ICD-10-CM | POA: Diagnosis not present

## 2018-05-30 DIAGNOSIS — E1151 Type 2 diabetes mellitus with diabetic peripheral angiopathy without gangrene: Secondary | ICD-10-CM | POA: Diagnosis not present

## 2018-05-30 DIAGNOSIS — D509 Iron deficiency anemia, unspecified: Secondary | ICD-10-CM | POA: Diagnosis not present

## 2018-05-30 DIAGNOSIS — J449 Chronic obstructive pulmonary disease, unspecified: Secondary | ICD-10-CM | POA: Diagnosis not present

## 2018-05-30 DIAGNOSIS — E785 Hyperlipidemia, unspecified: Secondary | ICD-10-CM | POA: Diagnosis not present

## 2018-05-30 DIAGNOSIS — I110 Hypertensive heart disease with heart failure: Secondary | ICD-10-CM | POA: Diagnosis not present

## 2018-05-30 DIAGNOSIS — M159 Polyosteoarthritis, unspecified: Secondary | ICD-10-CM | POA: Diagnosis not present

## 2018-05-30 DIAGNOSIS — M8580 Other specified disorders of bone density and structure, unspecified site: Secondary | ICD-10-CM | POA: Diagnosis not present

## 2018-05-30 DIAGNOSIS — M109 Gout, unspecified: Secondary | ICD-10-CM | POA: Diagnosis not present

## 2018-05-30 DIAGNOSIS — K219 Gastro-esophageal reflux disease without esophagitis: Secondary | ICD-10-CM | POA: Diagnosis not present

## 2018-05-30 DIAGNOSIS — Z794 Long term (current) use of insulin: Secondary | ICD-10-CM | POA: Diagnosis not present

## 2018-05-30 DIAGNOSIS — Z7982 Long term (current) use of aspirin: Secondary | ICD-10-CM | POA: Diagnosis not present

## 2018-05-30 DIAGNOSIS — Z87891 Personal history of nicotine dependence: Secondary | ICD-10-CM | POA: Diagnosis not present

## 2018-05-30 DIAGNOSIS — E1165 Type 2 diabetes mellitus with hyperglycemia: Secondary | ICD-10-CM | POA: Diagnosis not present

## 2018-05-30 DIAGNOSIS — Z9981 Dependence on supplemental oxygen: Secondary | ICD-10-CM | POA: Diagnosis not present

## 2018-05-30 DIAGNOSIS — J9611 Chronic respiratory failure with hypoxia: Secondary | ICD-10-CM | POA: Diagnosis not present

## 2018-05-30 DIAGNOSIS — K760 Fatty (change of) liver, not elsewhere classified: Secondary | ICD-10-CM | POA: Diagnosis not present

## 2018-05-30 DIAGNOSIS — I4510 Unspecified right bundle-branch block: Secondary | ICD-10-CM | POA: Diagnosis not present

## 2018-06-02 DIAGNOSIS — Z87891 Personal history of nicotine dependence: Secondary | ICD-10-CM | POA: Diagnosis not present

## 2018-06-02 DIAGNOSIS — K219 Gastro-esophageal reflux disease without esophagitis: Secondary | ICD-10-CM | POA: Diagnosis not present

## 2018-06-02 DIAGNOSIS — R0902 Hypoxemia: Secondary | ICD-10-CM | POA: Diagnosis not present

## 2018-06-02 DIAGNOSIS — R0600 Dyspnea, unspecified: Secondary | ICD-10-CM | POA: Diagnosis not present

## 2018-06-02 DIAGNOSIS — E785 Hyperlipidemia, unspecified: Secondary | ICD-10-CM | POA: Diagnosis not present

## 2018-06-02 DIAGNOSIS — E1151 Type 2 diabetes mellitus with diabetic peripheral angiopathy without gangrene: Secondary | ICD-10-CM | POA: Diagnosis not present

## 2018-06-02 DIAGNOSIS — Z7982 Long term (current) use of aspirin: Secondary | ICD-10-CM | POA: Diagnosis not present

## 2018-06-02 DIAGNOSIS — I4510 Unspecified right bundle-branch block: Secondary | ICD-10-CM | POA: Diagnosis not present

## 2018-06-02 DIAGNOSIS — I509 Heart failure, unspecified: Secondary | ICD-10-CM | POA: Diagnosis not present

## 2018-06-02 DIAGNOSIS — M8580 Other specified disorders of bone density and structure, unspecified site: Secondary | ICD-10-CM | POA: Diagnosis not present

## 2018-06-02 DIAGNOSIS — Z9981 Dependence on supplemental oxygen: Secondary | ICD-10-CM | POA: Diagnosis not present

## 2018-06-02 DIAGNOSIS — E1142 Type 2 diabetes mellitus with diabetic polyneuropathy: Secondary | ICD-10-CM | POA: Diagnosis not present

## 2018-06-02 DIAGNOSIS — K760 Fatty (change of) liver, not elsewhere classified: Secondary | ICD-10-CM | POA: Diagnosis not present

## 2018-06-02 DIAGNOSIS — M109 Gout, unspecified: Secondary | ICD-10-CM | POA: Diagnosis not present

## 2018-06-02 DIAGNOSIS — I361 Nonrheumatic tricuspid (valve) insufficiency: Secondary | ICD-10-CM | POA: Diagnosis not present

## 2018-06-02 DIAGNOSIS — I340 Nonrheumatic mitral (valve) insufficiency: Secondary | ICD-10-CM | POA: Diagnosis not present

## 2018-06-02 DIAGNOSIS — R7989 Other specified abnormal findings of blood chemistry: Secondary | ICD-10-CM | POA: Diagnosis not present

## 2018-06-02 DIAGNOSIS — I959 Hypotension, unspecified: Secondary | ICD-10-CM | POA: Diagnosis not present

## 2018-06-02 DIAGNOSIS — R079 Chest pain, unspecified: Secondary | ICD-10-CM | POA: Diagnosis not present

## 2018-06-02 DIAGNOSIS — M159 Polyosteoarthritis, unspecified: Secondary | ICD-10-CM | POA: Diagnosis not present

## 2018-06-02 DIAGNOSIS — E1165 Type 2 diabetes mellitus with hyperglycemia: Secondary | ICD-10-CM | POA: Diagnosis not present

## 2018-06-02 DIAGNOSIS — I110 Hypertensive heart disease with heart failure: Secondary | ICD-10-CM | POA: Diagnosis not present

## 2018-06-02 DIAGNOSIS — D509 Iron deficiency anemia, unspecified: Secondary | ICD-10-CM | POA: Diagnosis not present

## 2018-06-02 DIAGNOSIS — J449 Chronic obstructive pulmonary disease, unspecified: Secondary | ICD-10-CM | POA: Diagnosis not present

## 2018-06-02 DIAGNOSIS — J441 Chronic obstructive pulmonary disease with (acute) exacerbation: Secondary | ICD-10-CM | POA: Diagnosis not present

## 2018-06-02 DIAGNOSIS — Z794 Long term (current) use of insulin: Secondary | ICD-10-CM | POA: Diagnosis not present

## 2018-06-02 DIAGNOSIS — R0602 Shortness of breath: Secondary | ICD-10-CM | POA: Diagnosis not present

## 2018-06-02 DIAGNOSIS — E1159 Type 2 diabetes mellitus with other circulatory complications: Secondary | ICD-10-CM | POA: Diagnosis not present

## 2018-06-02 DIAGNOSIS — R069 Unspecified abnormalities of breathing: Secondary | ICD-10-CM | POA: Diagnosis not present

## 2018-06-02 DIAGNOSIS — J9611 Chronic respiratory failure with hypoxia: Secondary | ICD-10-CM | POA: Diagnosis not present

## 2018-06-02 DIAGNOSIS — I10 Essential (primary) hypertension: Secondary | ICD-10-CM | POA: Diagnosis not present

## 2018-06-03 ENCOUNTER — Encounter: Payer: Self-pay | Admitting: Internal Medicine

## 2018-06-03 DIAGNOSIS — D509 Iron deficiency anemia, unspecified: Secondary | ICD-10-CM | POA: Diagnosis not present

## 2018-06-03 DIAGNOSIS — I10 Essential (primary) hypertension: Secondary | ICD-10-CM | POA: Diagnosis not present

## 2018-06-03 DIAGNOSIS — Z9989 Dependence on other enabling machines and devices: Secondary | ICD-10-CM | POA: Diagnosis not present

## 2018-06-03 DIAGNOSIS — Z79899 Other long term (current) drug therapy: Secondary | ICD-10-CM | POA: Diagnosis not present

## 2018-06-03 DIAGNOSIS — R0600 Dyspnea, unspecified: Secondary | ICD-10-CM | POA: Diagnosis not present

## 2018-06-03 DIAGNOSIS — R600 Localized edema: Secondary | ICD-10-CM | POA: Diagnosis not present

## 2018-06-03 DIAGNOSIS — E7800 Pure hypercholesterolemia, unspecified: Secondary | ICD-10-CM | POA: Diagnosis not present

## 2018-06-03 DIAGNOSIS — R5381 Other malaise: Secondary | ICD-10-CM | POA: Diagnosis not present

## 2018-06-03 DIAGNOSIS — J9611 Chronic respiratory failure with hypoxia: Secondary | ICD-10-CM | POA: Diagnosis not present

## 2018-06-03 DIAGNOSIS — E1159 Type 2 diabetes mellitus with other circulatory complications: Secondary | ICD-10-CM | POA: Diagnosis not present

## 2018-06-03 DIAGNOSIS — Z9981 Dependence on supplemental oxygen: Secondary | ICD-10-CM | POA: Diagnosis not present

## 2018-06-03 DIAGNOSIS — E1165 Type 2 diabetes mellitus with hyperglycemia: Secondary | ICD-10-CM | POA: Diagnosis not present

## 2018-06-03 DIAGNOSIS — R911 Solitary pulmonary nodule: Secondary | ICD-10-CM | POA: Diagnosis not present

## 2018-06-03 DIAGNOSIS — Z7982 Long term (current) use of aspirin: Secondary | ICD-10-CM | POA: Diagnosis not present

## 2018-06-03 DIAGNOSIS — R079 Chest pain, unspecified: Secondary | ICD-10-CM | POA: Diagnosis not present

## 2018-06-03 DIAGNOSIS — M109 Gout, unspecified: Secondary | ICD-10-CM | POA: Diagnosis not present

## 2018-06-03 DIAGNOSIS — G4733 Obstructive sleep apnea (adult) (pediatric): Secondary | ICD-10-CM | POA: Diagnosis not present

## 2018-06-03 DIAGNOSIS — R7989 Other specified abnormal findings of blood chemistry: Secondary | ICD-10-CM | POA: Diagnosis not present

## 2018-06-03 DIAGNOSIS — Z9119 Patient's noncompliance with other medical treatment and regimen: Secondary | ICD-10-CM | POA: Diagnosis not present

## 2018-06-03 DIAGNOSIS — J441 Chronic obstructive pulmonary disease with (acute) exacerbation: Secondary | ICD-10-CM | POA: Diagnosis not present

## 2018-06-03 DIAGNOSIS — Z794 Long term (current) use of insulin: Secondary | ICD-10-CM | POA: Diagnosis not present

## 2018-06-06 DIAGNOSIS — I110 Hypertensive heart disease with heart failure: Secondary | ICD-10-CM | POA: Diagnosis not present

## 2018-06-06 DIAGNOSIS — G4733 Obstructive sleep apnea (adult) (pediatric): Secondary | ICD-10-CM | POA: Diagnosis not present

## 2018-06-06 DIAGNOSIS — E1151 Type 2 diabetes mellitus with diabetic peripheral angiopathy without gangrene: Secondary | ICD-10-CM | POA: Diagnosis not present

## 2018-06-06 DIAGNOSIS — I4510 Unspecified right bundle-branch block: Secondary | ICD-10-CM | POA: Diagnosis not present

## 2018-06-06 DIAGNOSIS — J9612 Chronic respiratory failure with hypercapnia: Secondary | ICD-10-CM | POA: Diagnosis not present

## 2018-06-06 DIAGNOSIS — E785 Hyperlipidemia, unspecified: Secondary | ICD-10-CM | POA: Diagnosis not present

## 2018-06-06 DIAGNOSIS — E1142 Type 2 diabetes mellitus with diabetic polyneuropathy: Secondary | ICD-10-CM | POA: Diagnosis not present

## 2018-06-06 DIAGNOSIS — J441 Chronic obstructive pulmonary disease with (acute) exacerbation: Secondary | ICD-10-CM | POA: Diagnosis not present

## 2018-06-06 DIAGNOSIS — Z7982 Long term (current) use of aspirin: Secondary | ICD-10-CM | POA: Diagnosis not present

## 2018-06-06 DIAGNOSIS — Z9049 Acquired absence of other specified parts of digestive tract: Secondary | ICD-10-CM | POA: Diagnosis not present

## 2018-06-06 DIAGNOSIS — J449 Chronic obstructive pulmonary disease, unspecified: Secondary | ICD-10-CM | POA: Diagnosis not present

## 2018-06-06 DIAGNOSIS — I509 Heart failure, unspecified: Secondary | ICD-10-CM | POA: Diagnosis not present

## 2018-06-06 DIAGNOSIS — Z794 Long term (current) use of insulin: Secondary | ICD-10-CM | POA: Diagnosis not present

## 2018-06-06 DIAGNOSIS — D509 Iron deficiency anemia, unspecified: Secondary | ICD-10-CM | POA: Diagnosis not present

## 2018-06-06 DIAGNOSIS — Z87891 Personal history of nicotine dependence: Secondary | ICD-10-CM | POA: Diagnosis not present

## 2018-06-06 DIAGNOSIS — K219 Gastro-esophageal reflux disease without esophagitis: Secondary | ICD-10-CM | POA: Diagnosis not present

## 2018-06-06 DIAGNOSIS — E7800 Pure hypercholesterolemia, unspecified: Secondary | ICD-10-CM | POA: Diagnosis not present

## 2018-06-06 DIAGNOSIS — M109 Gout, unspecified: Secondary | ICD-10-CM | POA: Diagnosis not present

## 2018-06-06 DIAGNOSIS — Z9981 Dependence on supplemental oxygen: Secondary | ICD-10-CM | POA: Diagnosis not present

## 2018-06-06 DIAGNOSIS — M8580 Other specified disorders of bone density and structure, unspecified site: Secondary | ICD-10-CM | POA: Diagnosis not present

## 2018-06-06 DIAGNOSIS — K760 Fatty (change of) liver, not elsewhere classified: Secondary | ICD-10-CM | POA: Diagnosis not present

## 2018-06-06 DIAGNOSIS — J9611 Chronic respiratory failure with hypoxia: Secondary | ICD-10-CM | POA: Diagnosis not present

## 2018-06-06 DIAGNOSIS — Z87440 Personal history of urinary (tract) infections: Secondary | ICD-10-CM | POA: Diagnosis not present

## 2018-06-06 DIAGNOSIS — M159 Polyosteoarthritis, unspecified: Secondary | ICD-10-CM | POA: Diagnosis not present

## 2018-06-06 DIAGNOSIS — E1165 Type 2 diabetes mellitus with hyperglycemia: Secondary | ICD-10-CM | POA: Diagnosis not present

## 2018-06-07 DIAGNOSIS — M109 Gout, unspecified: Secondary | ICD-10-CM | POA: Diagnosis not present

## 2018-06-07 DIAGNOSIS — M159 Polyosteoarthritis, unspecified: Secondary | ICD-10-CM | POA: Diagnosis not present

## 2018-06-07 DIAGNOSIS — J441 Chronic obstructive pulmonary disease with (acute) exacerbation: Secondary | ICD-10-CM | POA: Diagnosis not present

## 2018-06-07 DIAGNOSIS — E7800 Pure hypercholesterolemia, unspecified: Secondary | ICD-10-CM | POA: Diagnosis not present

## 2018-06-07 DIAGNOSIS — D509 Iron deficiency anemia, unspecified: Secondary | ICD-10-CM | POA: Diagnosis not present

## 2018-06-07 DIAGNOSIS — Z87440 Personal history of urinary (tract) infections: Secondary | ICD-10-CM | POA: Diagnosis not present

## 2018-06-07 DIAGNOSIS — I4510 Unspecified right bundle-branch block: Secondary | ICD-10-CM | POA: Diagnosis not present

## 2018-06-07 DIAGNOSIS — Z794 Long term (current) use of insulin: Secondary | ICD-10-CM | POA: Diagnosis not present

## 2018-06-07 DIAGNOSIS — J9612 Chronic respiratory failure with hypercapnia: Secondary | ICD-10-CM | POA: Diagnosis not present

## 2018-06-07 DIAGNOSIS — M8580 Other specified disorders of bone density and structure, unspecified site: Secondary | ICD-10-CM | POA: Diagnosis not present

## 2018-06-07 DIAGNOSIS — J9611 Chronic respiratory failure with hypoxia: Secondary | ICD-10-CM | POA: Diagnosis not present

## 2018-06-07 DIAGNOSIS — E1142 Type 2 diabetes mellitus with diabetic polyneuropathy: Secondary | ICD-10-CM | POA: Diagnosis not present

## 2018-06-07 DIAGNOSIS — Z9981 Dependence on supplemental oxygen: Secondary | ICD-10-CM | POA: Diagnosis not present

## 2018-06-07 DIAGNOSIS — K219 Gastro-esophageal reflux disease without esophagitis: Secondary | ICD-10-CM | POA: Diagnosis not present

## 2018-06-07 DIAGNOSIS — Z7982 Long term (current) use of aspirin: Secondary | ICD-10-CM | POA: Diagnosis not present

## 2018-06-07 DIAGNOSIS — E1165 Type 2 diabetes mellitus with hyperglycemia: Secondary | ICD-10-CM | POA: Diagnosis not present

## 2018-06-07 DIAGNOSIS — K760 Fatty (change of) liver, not elsewhere classified: Secondary | ICD-10-CM | POA: Diagnosis not present

## 2018-06-07 DIAGNOSIS — Z87891 Personal history of nicotine dependence: Secondary | ICD-10-CM | POA: Diagnosis not present

## 2018-06-07 DIAGNOSIS — Z9049 Acquired absence of other specified parts of digestive tract: Secondary | ICD-10-CM | POA: Diagnosis not present

## 2018-06-07 DIAGNOSIS — I110 Hypertensive heart disease with heart failure: Secondary | ICD-10-CM | POA: Diagnosis not present

## 2018-06-07 DIAGNOSIS — E1151 Type 2 diabetes mellitus with diabetic peripheral angiopathy without gangrene: Secondary | ICD-10-CM | POA: Diagnosis not present

## 2018-06-07 DIAGNOSIS — G4733 Obstructive sleep apnea (adult) (pediatric): Secondary | ICD-10-CM | POA: Diagnosis not present

## 2018-06-07 DIAGNOSIS — I509 Heart failure, unspecified: Secondary | ICD-10-CM | POA: Diagnosis not present

## 2018-06-07 DIAGNOSIS — E785 Hyperlipidemia, unspecified: Secondary | ICD-10-CM | POA: Diagnosis not present

## 2018-06-08 DIAGNOSIS — E1165 Type 2 diabetes mellitus with hyperglycemia: Secondary | ICD-10-CM | POA: Diagnosis not present

## 2018-06-08 DIAGNOSIS — E1142 Type 2 diabetes mellitus with diabetic polyneuropathy: Secondary | ICD-10-CM | POA: Diagnosis not present

## 2018-06-08 DIAGNOSIS — G4733 Obstructive sleep apnea (adult) (pediatric): Secondary | ICD-10-CM | POA: Diagnosis not present

## 2018-06-08 DIAGNOSIS — K219 Gastro-esophageal reflux disease without esophagitis: Secondary | ICD-10-CM | POA: Diagnosis not present

## 2018-06-08 DIAGNOSIS — Z7982 Long term (current) use of aspirin: Secondary | ICD-10-CM | POA: Diagnosis not present

## 2018-06-08 DIAGNOSIS — E7800 Pure hypercholesterolemia, unspecified: Secondary | ICD-10-CM | POA: Diagnosis not present

## 2018-06-08 DIAGNOSIS — K760 Fatty (change of) liver, not elsewhere classified: Secondary | ICD-10-CM | POA: Diagnosis not present

## 2018-06-08 DIAGNOSIS — Z87891 Personal history of nicotine dependence: Secondary | ICD-10-CM | POA: Diagnosis not present

## 2018-06-08 DIAGNOSIS — D509 Iron deficiency anemia, unspecified: Secondary | ICD-10-CM | POA: Diagnosis not present

## 2018-06-08 DIAGNOSIS — E1151 Type 2 diabetes mellitus with diabetic peripheral angiopathy without gangrene: Secondary | ICD-10-CM | POA: Diagnosis not present

## 2018-06-08 DIAGNOSIS — M159 Polyosteoarthritis, unspecified: Secondary | ICD-10-CM | POA: Diagnosis not present

## 2018-06-08 DIAGNOSIS — J9612 Chronic respiratory failure with hypercapnia: Secondary | ICD-10-CM | POA: Diagnosis not present

## 2018-06-08 DIAGNOSIS — I4510 Unspecified right bundle-branch block: Secondary | ICD-10-CM | POA: Diagnosis not present

## 2018-06-08 DIAGNOSIS — M109 Gout, unspecified: Secondary | ICD-10-CM | POA: Diagnosis not present

## 2018-06-08 DIAGNOSIS — I110 Hypertensive heart disease with heart failure: Secondary | ICD-10-CM | POA: Diagnosis not present

## 2018-06-08 DIAGNOSIS — Z9049 Acquired absence of other specified parts of digestive tract: Secondary | ICD-10-CM | POA: Diagnosis not present

## 2018-06-08 DIAGNOSIS — Z794 Long term (current) use of insulin: Secondary | ICD-10-CM | POA: Diagnosis not present

## 2018-06-08 DIAGNOSIS — M8580 Other specified disorders of bone density and structure, unspecified site: Secondary | ICD-10-CM | POA: Diagnosis not present

## 2018-06-08 DIAGNOSIS — J441 Chronic obstructive pulmonary disease with (acute) exacerbation: Secondary | ICD-10-CM | POA: Diagnosis not present

## 2018-06-08 DIAGNOSIS — Z87440 Personal history of urinary (tract) infections: Secondary | ICD-10-CM | POA: Diagnosis not present

## 2018-06-08 DIAGNOSIS — I509 Heart failure, unspecified: Secondary | ICD-10-CM | POA: Diagnosis not present

## 2018-06-08 DIAGNOSIS — E785 Hyperlipidemia, unspecified: Secondary | ICD-10-CM | POA: Diagnosis not present

## 2018-06-08 DIAGNOSIS — J9611 Chronic respiratory failure with hypoxia: Secondary | ICD-10-CM | POA: Diagnosis not present

## 2018-06-08 DIAGNOSIS — Z9981 Dependence on supplemental oxygen: Secondary | ICD-10-CM | POA: Diagnosis not present

## 2018-06-09 DIAGNOSIS — J441 Chronic obstructive pulmonary disease with (acute) exacerbation: Secondary | ICD-10-CM | POA: Diagnosis not present

## 2018-06-09 DIAGNOSIS — I110 Hypertensive heart disease with heart failure: Secondary | ICD-10-CM | POA: Diagnosis not present

## 2018-06-09 DIAGNOSIS — E1151 Type 2 diabetes mellitus with diabetic peripheral angiopathy without gangrene: Secondary | ICD-10-CM | POA: Diagnosis not present

## 2018-06-09 DIAGNOSIS — E1142 Type 2 diabetes mellitus with diabetic polyneuropathy: Secondary | ICD-10-CM | POA: Diagnosis not present

## 2018-06-09 DIAGNOSIS — K760 Fatty (change of) liver, not elsewhere classified: Secondary | ICD-10-CM | POA: Diagnosis not present

## 2018-06-09 DIAGNOSIS — M8580 Other specified disorders of bone density and structure, unspecified site: Secondary | ICD-10-CM | POA: Diagnosis not present

## 2018-06-09 DIAGNOSIS — Z794 Long term (current) use of insulin: Secondary | ICD-10-CM | POA: Diagnosis not present

## 2018-06-09 DIAGNOSIS — M109 Gout, unspecified: Secondary | ICD-10-CM | POA: Diagnosis not present

## 2018-06-09 DIAGNOSIS — K219 Gastro-esophageal reflux disease without esophagitis: Secondary | ICD-10-CM | POA: Diagnosis not present

## 2018-06-09 DIAGNOSIS — M159 Polyosteoarthritis, unspecified: Secondary | ICD-10-CM | POA: Diagnosis not present

## 2018-06-09 DIAGNOSIS — E785 Hyperlipidemia, unspecified: Secondary | ICD-10-CM | POA: Diagnosis not present

## 2018-06-09 DIAGNOSIS — E7800 Pure hypercholesterolemia, unspecified: Secondary | ICD-10-CM | POA: Diagnosis not present

## 2018-06-09 DIAGNOSIS — J9611 Chronic respiratory failure with hypoxia: Secondary | ICD-10-CM | POA: Diagnosis not present

## 2018-06-09 DIAGNOSIS — Z87891 Personal history of nicotine dependence: Secondary | ICD-10-CM | POA: Diagnosis not present

## 2018-06-09 DIAGNOSIS — Z7982 Long term (current) use of aspirin: Secondary | ICD-10-CM | POA: Diagnosis not present

## 2018-06-09 DIAGNOSIS — E1165 Type 2 diabetes mellitus with hyperglycemia: Secondary | ICD-10-CM | POA: Diagnosis not present

## 2018-06-09 DIAGNOSIS — Z87440 Personal history of urinary (tract) infections: Secondary | ICD-10-CM | POA: Diagnosis not present

## 2018-06-09 DIAGNOSIS — J9612 Chronic respiratory failure with hypercapnia: Secondary | ICD-10-CM | POA: Diagnosis not present

## 2018-06-09 DIAGNOSIS — I4510 Unspecified right bundle-branch block: Secondary | ICD-10-CM | POA: Diagnosis not present

## 2018-06-09 DIAGNOSIS — Z9049 Acquired absence of other specified parts of digestive tract: Secondary | ICD-10-CM | POA: Diagnosis not present

## 2018-06-09 DIAGNOSIS — D509 Iron deficiency anemia, unspecified: Secondary | ICD-10-CM | POA: Diagnosis not present

## 2018-06-09 DIAGNOSIS — G4733 Obstructive sleep apnea (adult) (pediatric): Secondary | ICD-10-CM | POA: Diagnosis not present

## 2018-06-09 DIAGNOSIS — I509 Heart failure, unspecified: Secondary | ICD-10-CM | POA: Diagnosis not present

## 2018-06-09 DIAGNOSIS — Z9981 Dependence on supplemental oxygen: Secondary | ICD-10-CM | POA: Diagnosis not present

## 2018-06-13 DIAGNOSIS — I4510 Unspecified right bundle-branch block: Secondary | ICD-10-CM | POA: Diagnosis not present

## 2018-06-13 DIAGNOSIS — E1142 Type 2 diabetes mellitus with diabetic polyneuropathy: Secondary | ICD-10-CM | POA: Diagnosis not present

## 2018-06-13 DIAGNOSIS — G4733 Obstructive sleep apnea (adult) (pediatric): Secondary | ICD-10-CM | POA: Diagnosis not present

## 2018-06-13 DIAGNOSIS — E1165 Type 2 diabetes mellitus with hyperglycemia: Secondary | ICD-10-CM | POA: Diagnosis not present

## 2018-06-13 DIAGNOSIS — E1151 Type 2 diabetes mellitus with diabetic peripheral angiopathy without gangrene: Secondary | ICD-10-CM | POA: Diagnosis not present

## 2018-06-13 DIAGNOSIS — Z794 Long term (current) use of insulin: Secondary | ICD-10-CM | POA: Diagnosis not present

## 2018-06-13 DIAGNOSIS — J9611 Chronic respiratory failure with hypoxia: Secondary | ICD-10-CM | POA: Diagnosis not present

## 2018-06-13 DIAGNOSIS — M159 Polyosteoarthritis, unspecified: Secondary | ICD-10-CM | POA: Diagnosis not present

## 2018-06-13 DIAGNOSIS — Z87891 Personal history of nicotine dependence: Secondary | ICD-10-CM | POA: Diagnosis not present

## 2018-06-13 DIAGNOSIS — E7800 Pure hypercholesterolemia, unspecified: Secondary | ICD-10-CM | POA: Diagnosis not present

## 2018-06-13 DIAGNOSIS — I509 Heart failure, unspecified: Secondary | ICD-10-CM | POA: Diagnosis not present

## 2018-06-13 DIAGNOSIS — I110 Hypertensive heart disease with heart failure: Secondary | ICD-10-CM | POA: Diagnosis not present

## 2018-06-13 DIAGNOSIS — J441 Chronic obstructive pulmonary disease with (acute) exacerbation: Secondary | ICD-10-CM | POA: Diagnosis not present

## 2018-06-13 DIAGNOSIS — J9612 Chronic respiratory failure with hypercapnia: Secondary | ICD-10-CM | POA: Diagnosis not present

## 2018-06-13 DIAGNOSIS — K219 Gastro-esophageal reflux disease without esophagitis: Secondary | ICD-10-CM | POA: Diagnosis not present

## 2018-06-13 DIAGNOSIS — E785 Hyperlipidemia, unspecified: Secondary | ICD-10-CM | POA: Diagnosis not present

## 2018-06-13 DIAGNOSIS — D509 Iron deficiency anemia, unspecified: Secondary | ICD-10-CM | POA: Diagnosis not present

## 2018-06-13 DIAGNOSIS — Z87440 Personal history of urinary (tract) infections: Secondary | ICD-10-CM | POA: Diagnosis not present

## 2018-06-13 DIAGNOSIS — K760 Fatty (change of) liver, not elsewhere classified: Secondary | ICD-10-CM | POA: Diagnosis not present

## 2018-06-13 DIAGNOSIS — Z9981 Dependence on supplemental oxygen: Secondary | ICD-10-CM | POA: Diagnosis not present

## 2018-06-13 DIAGNOSIS — Z7982 Long term (current) use of aspirin: Secondary | ICD-10-CM | POA: Diagnosis not present

## 2018-06-13 DIAGNOSIS — M8580 Other specified disorders of bone density and structure, unspecified site: Secondary | ICD-10-CM | POA: Diagnosis not present

## 2018-06-13 DIAGNOSIS — M109 Gout, unspecified: Secondary | ICD-10-CM | POA: Diagnosis not present

## 2018-06-13 DIAGNOSIS — Z9049 Acquired absence of other specified parts of digestive tract: Secondary | ICD-10-CM | POA: Diagnosis not present

## 2018-06-14 DIAGNOSIS — B359 Dermatophytosis, unspecified: Secondary | ICD-10-CM | POA: Diagnosis not present

## 2018-06-14 DIAGNOSIS — Z23 Encounter for immunization: Secondary | ICD-10-CM | POA: Diagnosis not present

## 2018-06-14 DIAGNOSIS — J449 Chronic obstructive pulmonary disease, unspecified: Secondary | ICD-10-CM | POA: Diagnosis not present

## 2018-06-14 DIAGNOSIS — E785 Hyperlipidemia, unspecified: Secondary | ICD-10-CM | POA: Diagnosis not present

## 2018-06-14 DIAGNOSIS — J9611 Chronic respiratory failure with hypoxia: Secondary | ICD-10-CM | POA: Diagnosis not present

## 2018-06-15 DIAGNOSIS — I4510 Unspecified right bundle-branch block: Secondary | ICD-10-CM | POA: Diagnosis not present

## 2018-06-15 DIAGNOSIS — Z9981 Dependence on supplemental oxygen: Secondary | ICD-10-CM | POA: Diagnosis not present

## 2018-06-15 DIAGNOSIS — J441 Chronic obstructive pulmonary disease with (acute) exacerbation: Secondary | ICD-10-CM | POA: Diagnosis not present

## 2018-06-15 DIAGNOSIS — Z87440 Personal history of urinary (tract) infections: Secondary | ICD-10-CM | POA: Diagnosis not present

## 2018-06-15 DIAGNOSIS — M8580 Other specified disorders of bone density and structure, unspecified site: Secondary | ICD-10-CM | POA: Diagnosis not present

## 2018-06-15 DIAGNOSIS — E1142 Type 2 diabetes mellitus with diabetic polyneuropathy: Secondary | ICD-10-CM | POA: Diagnosis not present

## 2018-06-15 DIAGNOSIS — K760 Fatty (change of) liver, not elsewhere classified: Secondary | ICD-10-CM | POA: Diagnosis not present

## 2018-06-15 DIAGNOSIS — I509 Heart failure, unspecified: Secondary | ICD-10-CM | POA: Diagnosis not present

## 2018-06-15 DIAGNOSIS — D509 Iron deficiency anemia, unspecified: Secondary | ICD-10-CM | POA: Diagnosis not present

## 2018-06-15 DIAGNOSIS — J9611 Chronic respiratory failure with hypoxia: Secondary | ICD-10-CM | POA: Diagnosis not present

## 2018-06-15 DIAGNOSIS — E7800 Pure hypercholesterolemia, unspecified: Secondary | ICD-10-CM | POA: Diagnosis not present

## 2018-06-15 DIAGNOSIS — E1151 Type 2 diabetes mellitus with diabetic peripheral angiopathy without gangrene: Secondary | ICD-10-CM | POA: Diagnosis not present

## 2018-06-15 DIAGNOSIS — K219 Gastro-esophageal reflux disease without esophagitis: Secondary | ICD-10-CM | POA: Diagnosis not present

## 2018-06-15 DIAGNOSIS — Z87891 Personal history of nicotine dependence: Secondary | ICD-10-CM | POA: Diagnosis not present

## 2018-06-15 DIAGNOSIS — Z794 Long term (current) use of insulin: Secondary | ICD-10-CM | POA: Diagnosis not present

## 2018-06-15 DIAGNOSIS — Z9049 Acquired absence of other specified parts of digestive tract: Secondary | ICD-10-CM | POA: Diagnosis not present

## 2018-06-15 DIAGNOSIS — I110 Hypertensive heart disease with heart failure: Secondary | ICD-10-CM | POA: Diagnosis not present

## 2018-06-15 DIAGNOSIS — M159 Polyosteoarthritis, unspecified: Secondary | ICD-10-CM | POA: Diagnosis not present

## 2018-06-15 DIAGNOSIS — J9612 Chronic respiratory failure with hypercapnia: Secondary | ICD-10-CM | POA: Diagnosis not present

## 2018-06-15 DIAGNOSIS — E785 Hyperlipidemia, unspecified: Secondary | ICD-10-CM | POA: Diagnosis not present

## 2018-06-15 DIAGNOSIS — Z7982 Long term (current) use of aspirin: Secondary | ICD-10-CM | POA: Diagnosis not present

## 2018-06-15 DIAGNOSIS — G4733 Obstructive sleep apnea (adult) (pediatric): Secondary | ICD-10-CM | POA: Diagnosis not present

## 2018-06-15 DIAGNOSIS — M109 Gout, unspecified: Secondary | ICD-10-CM | POA: Diagnosis not present

## 2018-06-15 DIAGNOSIS — E1165 Type 2 diabetes mellitus with hyperglycemia: Secondary | ICD-10-CM | POA: Diagnosis not present

## 2018-06-16 DIAGNOSIS — I110 Hypertensive heart disease with heart failure: Secondary | ICD-10-CM | POA: Diagnosis not present

## 2018-06-16 DIAGNOSIS — Z87440 Personal history of urinary (tract) infections: Secondary | ICD-10-CM | POA: Diagnosis not present

## 2018-06-16 DIAGNOSIS — M109 Gout, unspecified: Secondary | ICD-10-CM | POA: Diagnosis not present

## 2018-06-16 DIAGNOSIS — E1151 Type 2 diabetes mellitus with diabetic peripheral angiopathy without gangrene: Secondary | ICD-10-CM | POA: Diagnosis not present

## 2018-06-16 DIAGNOSIS — Z87891 Personal history of nicotine dependence: Secondary | ICD-10-CM | POA: Diagnosis not present

## 2018-06-16 DIAGNOSIS — J9611 Chronic respiratory failure with hypoxia: Secondary | ICD-10-CM | POA: Diagnosis not present

## 2018-06-16 DIAGNOSIS — E7800 Pure hypercholesterolemia, unspecified: Secondary | ICD-10-CM | POA: Diagnosis not present

## 2018-06-16 DIAGNOSIS — I4510 Unspecified right bundle-branch block: Secondary | ICD-10-CM | POA: Diagnosis not present

## 2018-06-16 DIAGNOSIS — M159 Polyosteoarthritis, unspecified: Secondary | ICD-10-CM | POA: Diagnosis not present

## 2018-06-16 DIAGNOSIS — Z7982 Long term (current) use of aspirin: Secondary | ICD-10-CM | POA: Diagnosis not present

## 2018-06-16 DIAGNOSIS — K219 Gastro-esophageal reflux disease without esophagitis: Secondary | ICD-10-CM | POA: Diagnosis not present

## 2018-06-16 DIAGNOSIS — I509 Heart failure, unspecified: Secondary | ICD-10-CM | POA: Diagnosis not present

## 2018-06-16 DIAGNOSIS — M8580 Other specified disorders of bone density and structure, unspecified site: Secondary | ICD-10-CM | POA: Diagnosis not present

## 2018-06-16 DIAGNOSIS — E1165 Type 2 diabetes mellitus with hyperglycemia: Secondary | ICD-10-CM | POA: Diagnosis not present

## 2018-06-16 DIAGNOSIS — Z794 Long term (current) use of insulin: Secondary | ICD-10-CM | POA: Diagnosis not present

## 2018-06-16 DIAGNOSIS — J9612 Chronic respiratory failure with hypercapnia: Secondary | ICD-10-CM | POA: Diagnosis not present

## 2018-06-16 DIAGNOSIS — E1142 Type 2 diabetes mellitus with diabetic polyneuropathy: Secondary | ICD-10-CM | POA: Diagnosis not present

## 2018-06-16 DIAGNOSIS — E785 Hyperlipidemia, unspecified: Secondary | ICD-10-CM | POA: Diagnosis not present

## 2018-06-16 DIAGNOSIS — Z9049 Acquired absence of other specified parts of digestive tract: Secondary | ICD-10-CM | POA: Diagnosis not present

## 2018-06-16 DIAGNOSIS — Z9981 Dependence on supplemental oxygen: Secondary | ICD-10-CM | POA: Diagnosis not present

## 2018-06-16 DIAGNOSIS — J441 Chronic obstructive pulmonary disease with (acute) exacerbation: Secondary | ICD-10-CM | POA: Diagnosis not present

## 2018-06-16 DIAGNOSIS — D509 Iron deficiency anemia, unspecified: Secondary | ICD-10-CM | POA: Diagnosis not present

## 2018-06-16 DIAGNOSIS — G4733 Obstructive sleep apnea (adult) (pediatric): Secondary | ICD-10-CM | POA: Diagnosis not present

## 2018-06-16 DIAGNOSIS — K760 Fatty (change of) liver, not elsewhere classified: Secondary | ICD-10-CM | POA: Diagnosis not present

## 2018-06-17 DIAGNOSIS — K219 Gastro-esophageal reflux disease without esophagitis: Secondary | ICD-10-CM | POA: Diagnosis not present

## 2018-06-17 DIAGNOSIS — J441 Chronic obstructive pulmonary disease with (acute) exacerbation: Secondary | ICD-10-CM | POA: Diagnosis not present

## 2018-06-17 DIAGNOSIS — E7800 Pure hypercholesterolemia, unspecified: Secondary | ICD-10-CM | POA: Diagnosis not present

## 2018-06-17 DIAGNOSIS — I509 Heart failure, unspecified: Secondary | ICD-10-CM | POA: Diagnosis not present

## 2018-06-17 DIAGNOSIS — M109 Gout, unspecified: Secondary | ICD-10-CM | POA: Diagnosis not present

## 2018-06-17 DIAGNOSIS — I4510 Unspecified right bundle-branch block: Secondary | ICD-10-CM | POA: Diagnosis not present

## 2018-06-17 DIAGNOSIS — I110 Hypertensive heart disease with heart failure: Secondary | ICD-10-CM | POA: Diagnosis not present

## 2018-06-17 DIAGNOSIS — G4733 Obstructive sleep apnea (adult) (pediatric): Secondary | ICD-10-CM | POA: Diagnosis not present

## 2018-06-17 DIAGNOSIS — M159 Polyosteoarthritis, unspecified: Secondary | ICD-10-CM | POA: Diagnosis not present

## 2018-06-17 DIAGNOSIS — Z9049 Acquired absence of other specified parts of digestive tract: Secondary | ICD-10-CM | POA: Diagnosis not present

## 2018-06-17 DIAGNOSIS — J9611 Chronic respiratory failure with hypoxia: Secondary | ICD-10-CM | POA: Diagnosis not present

## 2018-06-17 DIAGNOSIS — E1165 Type 2 diabetes mellitus with hyperglycemia: Secondary | ICD-10-CM | POA: Diagnosis not present

## 2018-06-17 DIAGNOSIS — E785 Hyperlipidemia, unspecified: Secondary | ICD-10-CM | POA: Diagnosis not present

## 2018-06-17 DIAGNOSIS — Z9981 Dependence on supplemental oxygen: Secondary | ICD-10-CM | POA: Diagnosis not present

## 2018-06-17 DIAGNOSIS — Z794 Long term (current) use of insulin: Secondary | ICD-10-CM | POA: Diagnosis not present

## 2018-06-17 DIAGNOSIS — Z7982 Long term (current) use of aspirin: Secondary | ICD-10-CM | POA: Diagnosis not present

## 2018-06-17 DIAGNOSIS — E1142 Type 2 diabetes mellitus with diabetic polyneuropathy: Secondary | ICD-10-CM | POA: Diagnosis not present

## 2018-06-17 DIAGNOSIS — E1151 Type 2 diabetes mellitus with diabetic peripheral angiopathy without gangrene: Secondary | ICD-10-CM | POA: Diagnosis not present

## 2018-06-17 DIAGNOSIS — K760 Fatty (change of) liver, not elsewhere classified: Secondary | ICD-10-CM | POA: Diagnosis not present

## 2018-06-17 DIAGNOSIS — D509 Iron deficiency anemia, unspecified: Secondary | ICD-10-CM | POA: Diagnosis not present

## 2018-06-17 DIAGNOSIS — J9612 Chronic respiratory failure with hypercapnia: Secondary | ICD-10-CM | POA: Diagnosis not present

## 2018-06-17 DIAGNOSIS — Z87440 Personal history of urinary (tract) infections: Secondary | ICD-10-CM | POA: Diagnosis not present

## 2018-06-17 DIAGNOSIS — M8580 Other specified disorders of bone density and structure, unspecified site: Secondary | ICD-10-CM | POA: Diagnosis not present

## 2018-06-17 DIAGNOSIS — Z87891 Personal history of nicotine dependence: Secondary | ICD-10-CM | POA: Diagnosis not present

## 2018-06-20 DIAGNOSIS — M159 Polyosteoarthritis, unspecified: Secondary | ICD-10-CM | POA: Diagnosis not present

## 2018-06-20 DIAGNOSIS — E7800 Pure hypercholesterolemia, unspecified: Secondary | ICD-10-CM | POA: Diagnosis not present

## 2018-06-20 DIAGNOSIS — Z9049 Acquired absence of other specified parts of digestive tract: Secondary | ICD-10-CM | POA: Diagnosis not present

## 2018-06-20 DIAGNOSIS — G4733 Obstructive sleep apnea (adult) (pediatric): Secondary | ICD-10-CM | POA: Diagnosis not present

## 2018-06-20 DIAGNOSIS — J9612 Chronic respiratory failure with hypercapnia: Secondary | ICD-10-CM | POA: Diagnosis not present

## 2018-06-20 DIAGNOSIS — E1142 Type 2 diabetes mellitus with diabetic polyneuropathy: Secondary | ICD-10-CM | POA: Diagnosis not present

## 2018-06-20 DIAGNOSIS — M8580 Other specified disorders of bone density and structure, unspecified site: Secondary | ICD-10-CM | POA: Diagnosis not present

## 2018-06-20 DIAGNOSIS — E1165 Type 2 diabetes mellitus with hyperglycemia: Secondary | ICD-10-CM | POA: Diagnosis not present

## 2018-06-20 DIAGNOSIS — E785 Hyperlipidemia, unspecified: Secondary | ICD-10-CM | POA: Diagnosis not present

## 2018-06-20 DIAGNOSIS — Z87440 Personal history of urinary (tract) infections: Secondary | ICD-10-CM | POA: Diagnosis not present

## 2018-06-20 DIAGNOSIS — Z794 Long term (current) use of insulin: Secondary | ICD-10-CM | POA: Diagnosis not present

## 2018-06-20 DIAGNOSIS — Z87891 Personal history of nicotine dependence: Secondary | ICD-10-CM | POA: Diagnosis not present

## 2018-06-20 DIAGNOSIS — Z7982 Long term (current) use of aspirin: Secondary | ICD-10-CM | POA: Diagnosis not present

## 2018-06-20 DIAGNOSIS — E1151 Type 2 diabetes mellitus with diabetic peripheral angiopathy without gangrene: Secondary | ICD-10-CM | POA: Diagnosis not present

## 2018-06-20 DIAGNOSIS — Z9981 Dependence on supplemental oxygen: Secondary | ICD-10-CM | POA: Diagnosis not present

## 2018-06-20 DIAGNOSIS — I509 Heart failure, unspecified: Secondary | ICD-10-CM | POA: Diagnosis not present

## 2018-06-20 DIAGNOSIS — M109 Gout, unspecified: Secondary | ICD-10-CM | POA: Diagnosis not present

## 2018-06-20 DIAGNOSIS — J441 Chronic obstructive pulmonary disease with (acute) exacerbation: Secondary | ICD-10-CM | POA: Diagnosis not present

## 2018-06-20 DIAGNOSIS — I110 Hypertensive heart disease with heart failure: Secondary | ICD-10-CM | POA: Diagnosis not present

## 2018-06-20 DIAGNOSIS — K760 Fatty (change of) liver, not elsewhere classified: Secondary | ICD-10-CM | POA: Diagnosis not present

## 2018-06-20 DIAGNOSIS — I4510 Unspecified right bundle-branch block: Secondary | ICD-10-CM | POA: Diagnosis not present

## 2018-06-20 DIAGNOSIS — J9611 Chronic respiratory failure with hypoxia: Secondary | ICD-10-CM | POA: Diagnosis not present

## 2018-06-20 DIAGNOSIS — K219 Gastro-esophageal reflux disease without esophagitis: Secondary | ICD-10-CM | POA: Diagnosis not present

## 2018-06-20 DIAGNOSIS — D509 Iron deficiency anemia, unspecified: Secondary | ICD-10-CM | POA: Diagnosis not present

## 2018-06-21 DIAGNOSIS — Z9981 Dependence on supplemental oxygen: Secondary | ICD-10-CM | POA: Diagnosis not present

## 2018-06-21 DIAGNOSIS — E1142 Type 2 diabetes mellitus with diabetic polyneuropathy: Secondary | ICD-10-CM | POA: Diagnosis not present

## 2018-06-21 DIAGNOSIS — D509 Iron deficiency anemia, unspecified: Secondary | ICD-10-CM | POA: Diagnosis not present

## 2018-06-21 DIAGNOSIS — M159 Polyosteoarthritis, unspecified: Secondary | ICD-10-CM | POA: Diagnosis not present

## 2018-06-21 DIAGNOSIS — Z87440 Personal history of urinary (tract) infections: Secondary | ICD-10-CM | POA: Diagnosis not present

## 2018-06-21 DIAGNOSIS — Z7982 Long term (current) use of aspirin: Secondary | ICD-10-CM | POA: Diagnosis not present

## 2018-06-21 DIAGNOSIS — J441 Chronic obstructive pulmonary disease with (acute) exacerbation: Secondary | ICD-10-CM | POA: Diagnosis not present

## 2018-06-21 DIAGNOSIS — I110 Hypertensive heart disease with heart failure: Secondary | ICD-10-CM | POA: Diagnosis not present

## 2018-06-21 DIAGNOSIS — E7800 Pure hypercholesterolemia, unspecified: Secondary | ICD-10-CM | POA: Diagnosis not present

## 2018-06-21 DIAGNOSIS — M8580 Other specified disorders of bone density and structure, unspecified site: Secondary | ICD-10-CM | POA: Diagnosis not present

## 2018-06-21 DIAGNOSIS — G4733 Obstructive sleep apnea (adult) (pediatric): Secondary | ICD-10-CM | POA: Diagnosis not present

## 2018-06-21 DIAGNOSIS — M109 Gout, unspecified: Secondary | ICD-10-CM | POA: Diagnosis not present

## 2018-06-21 DIAGNOSIS — E1165 Type 2 diabetes mellitus with hyperglycemia: Secondary | ICD-10-CM | POA: Diagnosis not present

## 2018-06-21 DIAGNOSIS — J9611 Chronic respiratory failure with hypoxia: Secondary | ICD-10-CM | POA: Diagnosis not present

## 2018-06-21 DIAGNOSIS — Z794 Long term (current) use of insulin: Secondary | ICD-10-CM | POA: Diagnosis not present

## 2018-06-21 DIAGNOSIS — E785 Hyperlipidemia, unspecified: Secondary | ICD-10-CM | POA: Diagnosis not present

## 2018-06-21 DIAGNOSIS — I509 Heart failure, unspecified: Secondary | ICD-10-CM | POA: Diagnosis not present

## 2018-06-21 DIAGNOSIS — K760 Fatty (change of) liver, not elsewhere classified: Secondary | ICD-10-CM | POA: Diagnosis not present

## 2018-06-21 DIAGNOSIS — Z87891 Personal history of nicotine dependence: Secondary | ICD-10-CM | POA: Diagnosis not present

## 2018-06-21 DIAGNOSIS — J9612 Chronic respiratory failure with hypercapnia: Secondary | ICD-10-CM | POA: Diagnosis not present

## 2018-06-21 DIAGNOSIS — E1151 Type 2 diabetes mellitus with diabetic peripheral angiopathy without gangrene: Secondary | ICD-10-CM | POA: Diagnosis not present

## 2018-06-21 DIAGNOSIS — K219 Gastro-esophageal reflux disease without esophagitis: Secondary | ICD-10-CM | POA: Diagnosis not present

## 2018-06-21 DIAGNOSIS — Z9049 Acquired absence of other specified parts of digestive tract: Secondary | ICD-10-CM | POA: Diagnosis not present

## 2018-06-21 DIAGNOSIS — I4510 Unspecified right bundle-branch block: Secondary | ICD-10-CM | POA: Diagnosis not present

## 2018-06-23 DIAGNOSIS — E1165 Type 2 diabetes mellitus with hyperglycemia: Secondary | ICD-10-CM | POA: Diagnosis not present

## 2018-06-23 DIAGNOSIS — Z7982 Long term (current) use of aspirin: Secondary | ICD-10-CM | POA: Diagnosis not present

## 2018-06-23 DIAGNOSIS — M159 Polyosteoarthritis, unspecified: Secondary | ICD-10-CM | POA: Diagnosis not present

## 2018-06-23 DIAGNOSIS — M8580 Other specified disorders of bone density and structure, unspecified site: Secondary | ICD-10-CM | POA: Diagnosis not present

## 2018-06-23 DIAGNOSIS — E1151 Type 2 diabetes mellitus with diabetic peripheral angiopathy without gangrene: Secondary | ICD-10-CM | POA: Diagnosis not present

## 2018-06-23 DIAGNOSIS — J9612 Chronic respiratory failure with hypercapnia: Secondary | ICD-10-CM | POA: Diagnosis not present

## 2018-06-23 DIAGNOSIS — Z9981 Dependence on supplemental oxygen: Secondary | ICD-10-CM | POA: Diagnosis not present

## 2018-06-23 DIAGNOSIS — K760 Fatty (change of) liver, not elsewhere classified: Secondary | ICD-10-CM | POA: Diagnosis not present

## 2018-06-23 DIAGNOSIS — J9611 Chronic respiratory failure with hypoxia: Secondary | ICD-10-CM | POA: Diagnosis not present

## 2018-06-23 DIAGNOSIS — I110 Hypertensive heart disease with heart failure: Secondary | ICD-10-CM | POA: Diagnosis not present

## 2018-06-23 DIAGNOSIS — Z9049 Acquired absence of other specified parts of digestive tract: Secondary | ICD-10-CM | POA: Diagnosis not present

## 2018-06-23 DIAGNOSIS — Z87440 Personal history of urinary (tract) infections: Secondary | ICD-10-CM | POA: Diagnosis not present

## 2018-06-23 DIAGNOSIS — E7800 Pure hypercholesterolemia, unspecified: Secondary | ICD-10-CM | POA: Diagnosis not present

## 2018-06-23 DIAGNOSIS — G4733 Obstructive sleep apnea (adult) (pediatric): Secondary | ICD-10-CM | POA: Diagnosis not present

## 2018-06-23 DIAGNOSIS — M109 Gout, unspecified: Secondary | ICD-10-CM | POA: Diagnosis not present

## 2018-06-23 DIAGNOSIS — J441 Chronic obstructive pulmonary disease with (acute) exacerbation: Secondary | ICD-10-CM | POA: Diagnosis not present

## 2018-06-23 DIAGNOSIS — I4510 Unspecified right bundle-branch block: Secondary | ICD-10-CM | POA: Diagnosis not present

## 2018-06-23 DIAGNOSIS — D509 Iron deficiency anemia, unspecified: Secondary | ICD-10-CM | POA: Diagnosis not present

## 2018-06-23 DIAGNOSIS — Z87891 Personal history of nicotine dependence: Secondary | ICD-10-CM | POA: Diagnosis not present

## 2018-06-23 DIAGNOSIS — E1142 Type 2 diabetes mellitus with diabetic polyneuropathy: Secondary | ICD-10-CM | POA: Diagnosis not present

## 2018-06-23 DIAGNOSIS — I509 Heart failure, unspecified: Secondary | ICD-10-CM | POA: Diagnosis not present

## 2018-06-23 DIAGNOSIS — E785 Hyperlipidemia, unspecified: Secondary | ICD-10-CM | POA: Diagnosis not present

## 2018-06-23 DIAGNOSIS — Z794 Long term (current) use of insulin: Secondary | ICD-10-CM | POA: Diagnosis not present

## 2018-06-23 DIAGNOSIS — K219 Gastro-esophageal reflux disease without esophagitis: Secondary | ICD-10-CM | POA: Diagnosis not present

## 2018-06-24 DIAGNOSIS — D509 Iron deficiency anemia, unspecified: Secondary | ICD-10-CM | POA: Diagnosis not present

## 2018-06-24 DIAGNOSIS — Z7982 Long term (current) use of aspirin: Secondary | ICD-10-CM | POA: Diagnosis not present

## 2018-06-24 DIAGNOSIS — I110 Hypertensive heart disease with heart failure: Secondary | ICD-10-CM | POA: Diagnosis not present

## 2018-06-24 DIAGNOSIS — J441 Chronic obstructive pulmonary disease with (acute) exacerbation: Secondary | ICD-10-CM | POA: Diagnosis not present

## 2018-06-24 DIAGNOSIS — Z87440 Personal history of urinary (tract) infections: Secondary | ICD-10-CM | POA: Diagnosis not present

## 2018-06-24 DIAGNOSIS — Z9981 Dependence on supplemental oxygen: Secondary | ICD-10-CM | POA: Diagnosis not present

## 2018-06-24 DIAGNOSIS — E1142 Type 2 diabetes mellitus with diabetic polyneuropathy: Secondary | ICD-10-CM | POA: Diagnosis not present

## 2018-06-24 DIAGNOSIS — J9611 Chronic respiratory failure with hypoxia: Secondary | ICD-10-CM | POA: Diagnosis not present

## 2018-06-24 DIAGNOSIS — K219 Gastro-esophageal reflux disease without esophagitis: Secondary | ICD-10-CM | POA: Diagnosis not present

## 2018-06-24 DIAGNOSIS — Z87891 Personal history of nicotine dependence: Secondary | ICD-10-CM | POA: Diagnosis not present

## 2018-06-24 DIAGNOSIS — G4733 Obstructive sleep apnea (adult) (pediatric): Secondary | ICD-10-CM | POA: Diagnosis not present

## 2018-06-24 DIAGNOSIS — I4510 Unspecified right bundle-branch block: Secondary | ICD-10-CM | POA: Diagnosis not present

## 2018-06-24 DIAGNOSIS — E7800 Pure hypercholesterolemia, unspecified: Secondary | ICD-10-CM | POA: Diagnosis not present

## 2018-06-24 DIAGNOSIS — M109 Gout, unspecified: Secondary | ICD-10-CM | POA: Diagnosis not present

## 2018-06-24 DIAGNOSIS — Z9049 Acquired absence of other specified parts of digestive tract: Secondary | ICD-10-CM | POA: Diagnosis not present

## 2018-06-24 DIAGNOSIS — M159 Polyosteoarthritis, unspecified: Secondary | ICD-10-CM | POA: Diagnosis not present

## 2018-06-24 DIAGNOSIS — E1165 Type 2 diabetes mellitus with hyperglycemia: Secondary | ICD-10-CM | POA: Diagnosis not present

## 2018-06-24 DIAGNOSIS — E1151 Type 2 diabetes mellitus with diabetic peripheral angiopathy without gangrene: Secondary | ICD-10-CM | POA: Diagnosis not present

## 2018-06-24 DIAGNOSIS — E785 Hyperlipidemia, unspecified: Secondary | ICD-10-CM | POA: Diagnosis not present

## 2018-06-24 DIAGNOSIS — M8580 Other specified disorders of bone density and structure, unspecified site: Secondary | ICD-10-CM | POA: Diagnosis not present

## 2018-06-24 DIAGNOSIS — K760 Fatty (change of) liver, not elsewhere classified: Secondary | ICD-10-CM | POA: Diagnosis not present

## 2018-06-24 DIAGNOSIS — Z794 Long term (current) use of insulin: Secondary | ICD-10-CM | POA: Diagnosis not present

## 2018-06-24 DIAGNOSIS — I509 Heart failure, unspecified: Secondary | ICD-10-CM | POA: Diagnosis not present

## 2018-06-24 DIAGNOSIS — J9612 Chronic respiratory failure with hypercapnia: Secondary | ICD-10-CM | POA: Diagnosis not present

## 2018-06-27 DIAGNOSIS — I509 Heart failure, unspecified: Secondary | ICD-10-CM | POA: Diagnosis not present

## 2018-06-27 DIAGNOSIS — M8580 Other specified disorders of bone density and structure, unspecified site: Secondary | ICD-10-CM | POA: Diagnosis not present

## 2018-06-27 DIAGNOSIS — J9611 Chronic respiratory failure with hypoxia: Secondary | ICD-10-CM | POA: Diagnosis not present

## 2018-06-27 DIAGNOSIS — E1165 Type 2 diabetes mellitus with hyperglycemia: Secondary | ICD-10-CM | POA: Diagnosis not present

## 2018-06-27 DIAGNOSIS — Z9049 Acquired absence of other specified parts of digestive tract: Secondary | ICD-10-CM | POA: Diagnosis not present

## 2018-06-27 DIAGNOSIS — I110 Hypertensive heart disease with heart failure: Secondary | ICD-10-CM | POA: Diagnosis not present

## 2018-06-27 DIAGNOSIS — K219 Gastro-esophageal reflux disease without esophagitis: Secondary | ICD-10-CM | POA: Diagnosis not present

## 2018-06-27 DIAGNOSIS — M159 Polyosteoarthritis, unspecified: Secondary | ICD-10-CM | POA: Diagnosis not present

## 2018-06-27 DIAGNOSIS — Z87891 Personal history of nicotine dependence: Secondary | ICD-10-CM | POA: Diagnosis not present

## 2018-06-27 DIAGNOSIS — Z7982 Long term (current) use of aspirin: Secondary | ICD-10-CM | POA: Diagnosis not present

## 2018-06-27 DIAGNOSIS — E1151 Type 2 diabetes mellitus with diabetic peripheral angiopathy without gangrene: Secondary | ICD-10-CM | POA: Diagnosis not present

## 2018-06-27 DIAGNOSIS — J441 Chronic obstructive pulmonary disease with (acute) exacerbation: Secondary | ICD-10-CM | POA: Diagnosis not present

## 2018-06-27 DIAGNOSIS — Z9981 Dependence on supplemental oxygen: Secondary | ICD-10-CM | POA: Diagnosis not present

## 2018-06-27 DIAGNOSIS — Z794 Long term (current) use of insulin: Secondary | ICD-10-CM | POA: Diagnosis not present

## 2018-06-27 DIAGNOSIS — Z87440 Personal history of urinary (tract) infections: Secondary | ICD-10-CM | POA: Diagnosis not present

## 2018-06-27 DIAGNOSIS — M109 Gout, unspecified: Secondary | ICD-10-CM | POA: Diagnosis not present

## 2018-06-27 DIAGNOSIS — E1142 Type 2 diabetes mellitus with diabetic polyneuropathy: Secondary | ICD-10-CM | POA: Diagnosis not present

## 2018-06-27 DIAGNOSIS — J9612 Chronic respiratory failure with hypercapnia: Secondary | ICD-10-CM | POA: Diagnosis not present

## 2018-06-27 DIAGNOSIS — G4733 Obstructive sleep apnea (adult) (pediatric): Secondary | ICD-10-CM | POA: Diagnosis not present

## 2018-06-27 DIAGNOSIS — E785 Hyperlipidemia, unspecified: Secondary | ICD-10-CM | POA: Diagnosis not present

## 2018-06-27 DIAGNOSIS — I4510 Unspecified right bundle-branch block: Secondary | ICD-10-CM | POA: Diagnosis not present

## 2018-06-27 DIAGNOSIS — E7800 Pure hypercholesterolemia, unspecified: Secondary | ICD-10-CM | POA: Diagnosis not present

## 2018-06-27 DIAGNOSIS — D509 Iron deficiency anemia, unspecified: Secondary | ICD-10-CM | POA: Diagnosis not present

## 2018-06-27 DIAGNOSIS — K760 Fatty (change of) liver, not elsewhere classified: Secondary | ICD-10-CM | POA: Diagnosis not present

## 2018-06-28 DIAGNOSIS — E7800 Pure hypercholesterolemia, unspecified: Secondary | ICD-10-CM | POA: Diagnosis not present

## 2018-06-28 DIAGNOSIS — I110 Hypertensive heart disease with heart failure: Secondary | ICD-10-CM | POA: Diagnosis not present

## 2018-06-28 DIAGNOSIS — E785 Hyperlipidemia, unspecified: Secondary | ICD-10-CM | POA: Diagnosis not present

## 2018-06-28 DIAGNOSIS — M109 Gout, unspecified: Secondary | ICD-10-CM | POA: Diagnosis not present

## 2018-06-28 DIAGNOSIS — Z9981 Dependence on supplemental oxygen: Secondary | ICD-10-CM | POA: Diagnosis not present

## 2018-06-28 DIAGNOSIS — J9611 Chronic respiratory failure with hypoxia: Secondary | ICD-10-CM | POA: Diagnosis not present

## 2018-06-28 DIAGNOSIS — I4510 Unspecified right bundle-branch block: Secondary | ICD-10-CM | POA: Diagnosis not present

## 2018-06-28 DIAGNOSIS — G4733 Obstructive sleep apnea (adult) (pediatric): Secondary | ICD-10-CM | POA: Diagnosis not present

## 2018-06-28 DIAGNOSIS — I509 Heart failure, unspecified: Secondary | ICD-10-CM | POA: Diagnosis not present

## 2018-06-28 DIAGNOSIS — J9612 Chronic respiratory failure with hypercapnia: Secondary | ICD-10-CM | POA: Diagnosis not present

## 2018-06-28 DIAGNOSIS — Z87891 Personal history of nicotine dependence: Secondary | ICD-10-CM | POA: Diagnosis not present

## 2018-06-28 DIAGNOSIS — M8580 Other specified disorders of bone density and structure, unspecified site: Secondary | ICD-10-CM | POA: Diagnosis not present

## 2018-06-28 DIAGNOSIS — J441 Chronic obstructive pulmonary disease with (acute) exacerbation: Secondary | ICD-10-CM | POA: Diagnosis not present

## 2018-06-28 DIAGNOSIS — E1151 Type 2 diabetes mellitus with diabetic peripheral angiopathy without gangrene: Secondary | ICD-10-CM | POA: Diagnosis not present

## 2018-06-28 DIAGNOSIS — D509 Iron deficiency anemia, unspecified: Secondary | ICD-10-CM | POA: Diagnosis not present

## 2018-06-28 DIAGNOSIS — K219 Gastro-esophageal reflux disease without esophagitis: Secondary | ICD-10-CM | POA: Diagnosis not present

## 2018-06-28 DIAGNOSIS — Z7982 Long term (current) use of aspirin: Secondary | ICD-10-CM | POA: Diagnosis not present

## 2018-06-28 DIAGNOSIS — K760 Fatty (change of) liver, not elsewhere classified: Secondary | ICD-10-CM | POA: Diagnosis not present

## 2018-06-28 DIAGNOSIS — M159 Polyosteoarthritis, unspecified: Secondary | ICD-10-CM | POA: Diagnosis not present

## 2018-06-28 DIAGNOSIS — Z9049 Acquired absence of other specified parts of digestive tract: Secondary | ICD-10-CM | POA: Diagnosis not present

## 2018-06-28 DIAGNOSIS — Z794 Long term (current) use of insulin: Secondary | ICD-10-CM | POA: Diagnosis not present

## 2018-06-28 DIAGNOSIS — E1165 Type 2 diabetes mellitus with hyperglycemia: Secondary | ICD-10-CM | POA: Diagnosis not present

## 2018-06-28 DIAGNOSIS — Z87440 Personal history of urinary (tract) infections: Secondary | ICD-10-CM | POA: Diagnosis not present

## 2018-06-28 DIAGNOSIS — E1142 Type 2 diabetes mellitus with diabetic polyneuropathy: Secondary | ICD-10-CM | POA: Diagnosis not present

## 2018-06-30 DIAGNOSIS — I509 Heart failure, unspecified: Secondary | ICD-10-CM | POA: Diagnosis not present

## 2018-06-30 DIAGNOSIS — E785 Hyperlipidemia, unspecified: Secondary | ICD-10-CM | POA: Diagnosis not present

## 2018-06-30 DIAGNOSIS — I4510 Unspecified right bundle-branch block: Secondary | ICD-10-CM | POA: Diagnosis not present

## 2018-06-30 DIAGNOSIS — J441 Chronic obstructive pulmonary disease with (acute) exacerbation: Secondary | ICD-10-CM | POA: Diagnosis not present

## 2018-06-30 DIAGNOSIS — D509 Iron deficiency anemia, unspecified: Secondary | ICD-10-CM | POA: Diagnosis not present

## 2018-06-30 DIAGNOSIS — G4733 Obstructive sleep apnea (adult) (pediatric): Secondary | ICD-10-CM | POA: Diagnosis not present

## 2018-06-30 DIAGNOSIS — M159 Polyosteoarthritis, unspecified: Secondary | ICD-10-CM | POA: Diagnosis not present

## 2018-06-30 DIAGNOSIS — J9612 Chronic respiratory failure with hypercapnia: Secondary | ICD-10-CM | POA: Diagnosis not present

## 2018-06-30 DIAGNOSIS — Z9049 Acquired absence of other specified parts of digestive tract: Secondary | ICD-10-CM | POA: Diagnosis not present

## 2018-06-30 DIAGNOSIS — K219 Gastro-esophageal reflux disease without esophagitis: Secondary | ICD-10-CM | POA: Diagnosis not present

## 2018-06-30 DIAGNOSIS — E1165 Type 2 diabetes mellitus with hyperglycemia: Secondary | ICD-10-CM | POA: Diagnosis not present

## 2018-06-30 DIAGNOSIS — Z87440 Personal history of urinary (tract) infections: Secondary | ICD-10-CM | POA: Diagnosis not present

## 2018-06-30 DIAGNOSIS — E1142 Type 2 diabetes mellitus with diabetic polyneuropathy: Secondary | ICD-10-CM | POA: Diagnosis not present

## 2018-06-30 DIAGNOSIS — M8580 Other specified disorders of bone density and structure, unspecified site: Secondary | ICD-10-CM | POA: Diagnosis not present

## 2018-06-30 DIAGNOSIS — Z7982 Long term (current) use of aspirin: Secondary | ICD-10-CM | POA: Diagnosis not present

## 2018-06-30 DIAGNOSIS — I110 Hypertensive heart disease with heart failure: Secondary | ICD-10-CM | POA: Diagnosis not present

## 2018-06-30 DIAGNOSIS — Z9981 Dependence on supplemental oxygen: Secondary | ICD-10-CM | POA: Diagnosis not present

## 2018-06-30 DIAGNOSIS — Z794 Long term (current) use of insulin: Secondary | ICD-10-CM | POA: Diagnosis not present

## 2018-06-30 DIAGNOSIS — J9611 Chronic respiratory failure with hypoxia: Secondary | ICD-10-CM | POA: Diagnosis not present

## 2018-06-30 DIAGNOSIS — K760 Fatty (change of) liver, not elsewhere classified: Secondary | ICD-10-CM | POA: Diagnosis not present

## 2018-06-30 DIAGNOSIS — M109 Gout, unspecified: Secondary | ICD-10-CM | POA: Diagnosis not present

## 2018-06-30 DIAGNOSIS — Z87891 Personal history of nicotine dependence: Secondary | ICD-10-CM | POA: Diagnosis not present

## 2018-06-30 DIAGNOSIS — E1151 Type 2 diabetes mellitus with diabetic peripheral angiopathy without gangrene: Secondary | ICD-10-CM | POA: Diagnosis not present

## 2018-06-30 DIAGNOSIS — E7800 Pure hypercholesterolemia, unspecified: Secondary | ICD-10-CM | POA: Diagnosis not present

## 2018-07-04 DIAGNOSIS — Z794 Long term (current) use of insulin: Secondary | ICD-10-CM | POA: Diagnosis not present

## 2018-07-04 DIAGNOSIS — Z9981 Dependence on supplemental oxygen: Secondary | ICD-10-CM | POA: Diagnosis not present

## 2018-07-04 DIAGNOSIS — K760 Fatty (change of) liver, not elsewhere classified: Secondary | ICD-10-CM | POA: Diagnosis not present

## 2018-07-04 DIAGNOSIS — Z9049 Acquired absence of other specified parts of digestive tract: Secondary | ICD-10-CM | POA: Diagnosis not present

## 2018-07-04 DIAGNOSIS — E785 Hyperlipidemia, unspecified: Secondary | ICD-10-CM | POA: Diagnosis not present

## 2018-07-04 DIAGNOSIS — Z87440 Personal history of urinary (tract) infections: Secondary | ICD-10-CM | POA: Diagnosis not present

## 2018-07-04 DIAGNOSIS — E1142 Type 2 diabetes mellitus with diabetic polyneuropathy: Secondary | ICD-10-CM | POA: Diagnosis not present

## 2018-07-04 DIAGNOSIS — M159 Polyosteoarthritis, unspecified: Secondary | ICD-10-CM | POA: Diagnosis not present

## 2018-07-04 DIAGNOSIS — G4733 Obstructive sleep apnea (adult) (pediatric): Secondary | ICD-10-CM | POA: Diagnosis not present

## 2018-07-04 DIAGNOSIS — Z7982 Long term (current) use of aspirin: Secondary | ICD-10-CM | POA: Diagnosis not present

## 2018-07-04 DIAGNOSIS — E1151 Type 2 diabetes mellitus with diabetic peripheral angiopathy without gangrene: Secondary | ICD-10-CM | POA: Diagnosis not present

## 2018-07-04 DIAGNOSIS — J9612 Chronic respiratory failure with hypercapnia: Secondary | ICD-10-CM | POA: Diagnosis not present

## 2018-07-04 DIAGNOSIS — K219 Gastro-esophageal reflux disease without esophagitis: Secondary | ICD-10-CM | POA: Diagnosis not present

## 2018-07-04 DIAGNOSIS — J9611 Chronic respiratory failure with hypoxia: Secondary | ICD-10-CM | POA: Diagnosis not present

## 2018-07-04 DIAGNOSIS — J441 Chronic obstructive pulmonary disease with (acute) exacerbation: Secondary | ICD-10-CM | POA: Diagnosis not present

## 2018-07-04 DIAGNOSIS — I110 Hypertensive heart disease with heart failure: Secondary | ICD-10-CM | POA: Diagnosis not present

## 2018-07-04 DIAGNOSIS — I509 Heart failure, unspecified: Secondary | ICD-10-CM | POA: Diagnosis not present

## 2018-07-04 DIAGNOSIS — Z87891 Personal history of nicotine dependence: Secondary | ICD-10-CM | POA: Diagnosis not present

## 2018-07-04 DIAGNOSIS — E1165 Type 2 diabetes mellitus with hyperglycemia: Secondary | ICD-10-CM | POA: Diagnosis not present

## 2018-07-04 DIAGNOSIS — E7800 Pure hypercholesterolemia, unspecified: Secondary | ICD-10-CM | POA: Diagnosis not present

## 2018-07-04 DIAGNOSIS — M8580 Other specified disorders of bone density and structure, unspecified site: Secondary | ICD-10-CM | POA: Diagnosis not present

## 2018-07-04 DIAGNOSIS — I4510 Unspecified right bundle-branch block: Secondary | ICD-10-CM | POA: Diagnosis not present

## 2018-07-04 DIAGNOSIS — M109 Gout, unspecified: Secondary | ICD-10-CM | POA: Diagnosis not present

## 2018-07-04 DIAGNOSIS — D509 Iron deficiency anemia, unspecified: Secondary | ICD-10-CM | POA: Diagnosis not present

## 2018-07-07 DIAGNOSIS — J9612 Chronic respiratory failure with hypercapnia: Secondary | ICD-10-CM | POA: Diagnosis not present

## 2018-07-07 DIAGNOSIS — K219 Gastro-esophageal reflux disease without esophagitis: Secondary | ICD-10-CM | POA: Diagnosis not present

## 2018-07-07 DIAGNOSIS — G4733 Obstructive sleep apnea (adult) (pediatric): Secondary | ICD-10-CM | POA: Diagnosis not present

## 2018-07-07 DIAGNOSIS — Z87440 Personal history of urinary (tract) infections: Secondary | ICD-10-CM | POA: Diagnosis not present

## 2018-07-07 DIAGNOSIS — J449 Chronic obstructive pulmonary disease, unspecified: Secondary | ICD-10-CM | POA: Diagnosis not present

## 2018-07-07 DIAGNOSIS — E7800 Pure hypercholesterolemia, unspecified: Secondary | ICD-10-CM | POA: Diagnosis not present

## 2018-07-07 DIAGNOSIS — M159 Polyosteoarthritis, unspecified: Secondary | ICD-10-CM | POA: Diagnosis not present

## 2018-07-07 DIAGNOSIS — Z9981 Dependence on supplemental oxygen: Secondary | ICD-10-CM | POA: Diagnosis not present

## 2018-07-07 DIAGNOSIS — E1151 Type 2 diabetes mellitus with diabetic peripheral angiopathy without gangrene: Secondary | ICD-10-CM | POA: Diagnosis not present

## 2018-07-07 DIAGNOSIS — M109 Gout, unspecified: Secondary | ICD-10-CM | POA: Diagnosis not present

## 2018-07-07 DIAGNOSIS — J9611 Chronic respiratory failure with hypoxia: Secondary | ICD-10-CM | POA: Diagnosis not present

## 2018-07-07 DIAGNOSIS — I110 Hypertensive heart disease with heart failure: Secondary | ICD-10-CM | POA: Diagnosis not present

## 2018-07-07 DIAGNOSIS — E1142 Type 2 diabetes mellitus with diabetic polyneuropathy: Secondary | ICD-10-CM | POA: Diagnosis not present

## 2018-07-07 DIAGNOSIS — Z87891 Personal history of nicotine dependence: Secondary | ICD-10-CM | POA: Diagnosis not present

## 2018-07-07 DIAGNOSIS — Z794 Long term (current) use of insulin: Secondary | ICD-10-CM | POA: Diagnosis not present

## 2018-07-07 DIAGNOSIS — E785 Hyperlipidemia, unspecified: Secondary | ICD-10-CM | POA: Diagnosis not present

## 2018-07-07 DIAGNOSIS — I509 Heart failure, unspecified: Secondary | ICD-10-CM | POA: Diagnosis not present

## 2018-07-07 DIAGNOSIS — M8580 Other specified disorders of bone density and structure, unspecified site: Secondary | ICD-10-CM | POA: Diagnosis not present

## 2018-07-07 DIAGNOSIS — K760 Fatty (change of) liver, not elsewhere classified: Secondary | ICD-10-CM | POA: Diagnosis not present

## 2018-07-07 DIAGNOSIS — J441 Chronic obstructive pulmonary disease with (acute) exacerbation: Secondary | ICD-10-CM | POA: Diagnosis not present

## 2018-07-07 DIAGNOSIS — E1165 Type 2 diabetes mellitus with hyperglycemia: Secondary | ICD-10-CM | POA: Diagnosis not present

## 2018-07-07 DIAGNOSIS — Z7982 Long term (current) use of aspirin: Secondary | ICD-10-CM | POA: Diagnosis not present

## 2018-07-07 DIAGNOSIS — I4510 Unspecified right bundle-branch block: Secondary | ICD-10-CM | POA: Diagnosis not present

## 2018-07-07 DIAGNOSIS — D509 Iron deficiency anemia, unspecified: Secondary | ICD-10-CM | POA: Diagnosis not present

## 2018-07-07 DIAGNOSIS — Z9049 Acquired absence of other specified parts of digestive tract: Secondary | ICD-10-CM | POA: Diagnosis not present

## 2018-07-08 DIAGNOSIS — Z794 Long term (current) use of insulin: Secondary | ICD-10-CM | POA: Diagnosis not present

## 2018-07-08 DIAGNOSIS — Z9049 Acquired absence of other specified parts of digestive tract: Secondary | ICD-10-CM | POA: Diagnosis not present

## 2018-07-08 DIAGNOSIS — E1151 Type 2 diabetes mellitus with diabetic peripheral angiopathy without gangrene: Secondary | ICD-10-CM | POA: Diagnosis not present

## 2018-07-08 DIAGNOSIS — L03116 Cellulitis of left lower limb: Secondary | ICD-10-CM | POA: Diagnosis not present

## 2018-07-08 DIAGNOSIS — E785 Hyperlipidemia, unspecified: Secondary | ICD-10-CM | POA: Diagnosis not present

## 2018-07-08 DIAGNOSIS — M8580 Other specified disorders of bone density and structure, unspecified site: Secondary | ICD-10-CM | POA: Diagnosis not present

## 2018-07-08 DIAGNOSIS — E1165 Type 2 diabetes mellitus with hyperglycemia: Secondary | ICD-10-CM | POA: Diagnosis not present

## 2018-07-08 DIAGNOSIS — Z9981 Dependence on supplemental oxygen: Secondary | ICD-10-CM | POA: Diagnosis not present

## 2018-07-08 DIAGNOSIS — E1142 Type 2 diabetes mellitus with diabetic polyneuropathy: Secondary | ICD-10-CM | POA: Diagnosis not present

## 2018-07-08 DIAGNOSIS — G4733 Obstructive sleep apnea (adult) (pediatric): Secondary | ICD-10-CM | POA: Diagnosis not present

## 2018-07-08 DIAGNOSIS — Z87891 Personal history of nicotine dependence: Secondary | ICD-10-CM | POA: Diagnosis not present

## 2018-07-08 DIAGNOSIS — J441 Chronic obstructive pulmonary disease with (acute) exacerbation: Secondary | ICD-10-CM | POA: Diagnosis not present

## 2018-07-08 DIAGNOSIS — Z87440 Personal history of urinary (tract) infections: Secondary | ICD-10-CM | POA: Diagnosis not present

## 2018-07-08 DIAGNOSIS — I110 Hypertensive heart disease with heart failure: Secondary | ICD-10-CM | POA: Diagnosis not present

## 2018-07-08 DIAGNOSIS — I509 Heart failure, unspecified: Secondary | ICD-10-CM | POA: Diagnosis not present

## 2018-07-08 DIAGNOSIS — D509 Iron deficiency anemia, unspecified: Secondary | ICD-10-CM | POA: Diagnosis not present

## 2018-07-08 DIAGNOSIS — I4510 Unspecified right bundle-branch block: Secondary | ICD-10-CM | POA: Diagnosis not present

## 2018-07-08 DIAGNOSIS — M159 Polyosteoarthritis, unspecified: Secondary | ICD-10-CM | POA: Diagnosis not present

## 2018-07-08 DIAGNOSIS — E1343 Other specified diabetes mellitus with diabetic autonomic (poly)neuropathy: Secondary | ICD-10-CM | POA: Diagnosis not present

## 2018-07-08 DIAGNOSIS — K219 Gastro-esophageal reflux disease without esophagitis: Secondary | ICD-10-CM | POA: Diagnosis not present

## 2018-07-08 DIAGNOSIS — K760 Fatty (change of) liver, not elsewhere classified: Secondary | ICD-10-CM | POA: Diagnosis not present

## 2018-07-08 DIAGNOSIS — Z7982 Long term (current) use of aspirin: Secondary | ICD-10-CM | POA: Diagnosis not present

## 2018-07-08 DIAGNOSIS — M109 Gout, unspecified: Secondary | ICD-10-CM | POA: Diagnosis not present

## 2018-07-08 DIAGNOSIS — J449 Chronic obstructive pulmonary disease, unspecified: Secondary | ICD-10-CM | POA: Diagnosis not present

## 2018-07-08 DIAGNOSIS — E7800 Pure hypercholesterolemia, unspecified: Secondary | ICD-10-CM | POA: Diagnosis not present

## 2018-07-08 DIAGNOSIS — J9611 Chronic respiratory failure with hypoxia: Secondary | ICD-10-CM | POA: Diagnosis not present

## 2018-07-08 DIAGNOSIS — J9612 Chronic respiratory failure with hypercapnia: Secondary | ICD-10-CM | POA: Diagnosis not present

## 2018-07-14 DIAGNOSIS — J449 Chronic obstructive pulmonary disease, unspecified: Secondary | ICD-10-CM | POA: Diagnosis not present

## 2018-07-14 DIAGNOSIS — E1343 Other specified diabetes mellitus with diabetic autonomic (poly)neuropathy: Secondary | ICD-10-CM | POA: Diagnosis not present

## 2018-07-14 DIAGNOSIS — L03116 Cellulitis of left lower limb: Secondary | ICD-10-CM | POA: Diagnosis not present

## 2018-07-14 DIAGNOSIS — E785 Hyperlipidemia, unspecified: Secondary | ICD-10-CM | POA: Diagnosis not present

## 2018-07-21 DIAGNOSIS — M8580 Other specified disorders of bone density and structure, unspecified site: Secondary | ICD-10-CM | POA: Diagnosis not present

## 2018-07-21 DIAGNOSIS — E1343 Other specified diabetes mellitus with diabetic autonomic (poly)neuropathy: Secondary | ICD-10-CM | POA: Diagnosis not present

## 2018-07-21 DIAGNOSIS — E785 Hyperlipidemia, unspecified: Secondary | ICD-10-CM | POA: Diagnosis not present

## 2018-07-21 DIAGNOSIS — L03116 Cellulitis of left lower limb: Secondary | ICD-10-CM | POA: Diagnosis not present

## 2018-07-21 DIAGNOSIS — J449 Chronic obstructive pulmonary disease, unspecified: Secondary | ICD-10-CM | POA: Diagnosis not present

## 2018-07-28 DIAGNOSIS — E1343 Other specified diabetes mellitus with diabetic autonomic (poly)neuropathy: Secondary | ICD-10-CM | POA: Diagnosis not present

## 2018-07-28 DIAGNOSIS — J449 Chronic obstructive pulmonary disease, unspecified: Secondary | ICD-10-CM | POA: Diagnosis not present

## 2018-07-28 DIAGNOSIS — L03116 Cellulitis of left lower limb: Secondary | ICD-10-CM | POA: Diagnosis not present

## 2018-07-28 DIAGNOSIS — M8580 Other specified disorders of bone density and structure, unspecified site: Secondary | ICD-10-CM | POA: Diagnosis not present

## 2018-07-28 DIAGNOSIS — E785 Hyperlipidemia, unspecified: Secondary | ICD-10-CM | POA: Diagnosis not present

## 2018-08-02 DIAGNOSIS — L97412 Non-pressure chronic ulcer of right heel and midfoot with fat layer exposed: Secondary | ICD-10-CM | POA: Diagnosis not present

## 2018-08-02 DIAGNOSIS — Z794 Long term (current) use of insulin: Secondary | ICD-10-CM | POA: Diagnosis not present

## 2018-08-02 DIAGNOSIS — I872 Venous insufficiency (chronic) (peripheral): Secondary | ICD-10-CM | POA: Diagnosis not present

## 2018-08-02 DIAGNOSIS — E1142 Type 2 diabetes mellitus with diabetic polyneuropathy: Secondary | ICD-10-CM | POA: Diagnosis not present

## 2018-08-02 DIAGNOSIS — E11621 Type 2 diabetes mellitus with foot ulcer: Secondary | ICD-10-CM | POA: Diagnosis not present

## 2018-08-04 DIAGNOSIS — E785 Hyperlipidemia, unspecified: Secondary | ICD-10-CM | POA: Diagnosis not present

## 2018-08-04 DIAGNOSIS — E1343 Other specified diabetes mellitus with diabetic autonomic (poly)neuropathy: Secondary | ICD-10-CM | POA: Diagnosis not present

## 2018-08-04 DIAGNOSIS — J449 Chronic obstructive pulmonary disease, unspecified: Secondary | ICD-10-CM | POA: Diagnosis not present

## 2018-08-04 DIAGNOSIS — L03116 Cellulitis of left lower limb: Secondary | ICD-10-CM | POA: Diagnosis not present

## 2018-08-06 DIAGNOSIS — J449 Chronic obstructive pulmonary disease, unspecified: Secondary | ICD-10-CM | POA: Diagnosis not present

## 2018-08-17 DIAGNOSIS — L03116 Cellulitis of left lower limb: Secondary | ICD-10-CM | POA: Diagnosis not present

## 2018-08-17 DIAGNOSIS — J449 Chronic obstructive pulmonary disease, unspecified: Secondary | ICD-10-CM | POA: Diagnosis not present

## 2018-08-17 DIAGNOSIS — E1343 Other specified diabetes mellitus with diabetic autonomic (poly)neuropathy: Secondary | ICD-10-CM | POA: Diagnosis not present

## 2018-08-17 DIAGNOSIS — M8580 Other specified disorders of bone density and structure, unspecified site: Secondary | ICD-10-CM | POA: Diagnosis not present

## 2018-08-17 DIAGNOSIS — E785 Hyperlipidemia, unspecified: Secondary | ICD-10-CM | POA: Diagnosis not present

## 2018-08-19 DIAGNOSIS — M109 Gout, unspecified: Secondary | ICD-10-CM | POA: Diagnosis not present

## 2018-08-19 DIAGNOSIS — E785 Hyperlipidemia, unspecified: Secondary | ICD-10-CM | POA: Diagnosis not present

## 2018-08-19 DIAGNOSIS — J449 Chronic obstructive pulmonary disease, unspecified: Secondary | ICD-10-CM | POA: Diagnosis not present

## 2018-08-19 DIAGNOSIS — I10 Essential (primary) hypertension: Secondary | ICD-10-CM | POA: Diagnosis not present

## 2018-08-19 DIAGNOSIS — Z139 Encounter for screening, unspecified: Secondary | ICD-10-CM | POA: Diagnosis not present

## 2018-08-19 DIAGNOSIS — Z9181 History of falling: Secondary | ICD-10-CM | POA: Diagnosis not present

## 2018-08-19 DIAGNOSIS — E119 Type 2 diabetes mellitus without complications: Secondary | ICD-10-CM | POA: Diagnosis not present

## 2018-08-19 DIAGNOSIS — E1343 Other specified diabetes mellitus with diabetic autonomic (poly)neuropathy: Secondary | ICD-10-CM | POA: Diagnosis not present

## 2018-08-26 DIAGNOSIS — I4510 Unspecified right bundle-branch block: Secondary | ICD-10-CM | POA: Diagnosis not present

## 2018-08-26 DIAGNOSIS — K219 Gastro-esophageal reflux disease without esophagitis: Secondary | ICD-10-CM | POA: Diagnosis not present

## 2018-08-26 DIAGNOSIS — J449 Chronic obstructive pulmonary disease, unspecified: Secondary | ICD-10-CM | POA: Diagnosis not present

## 2018-08-26 DIAGNOSIS — E1343 Other specified diabetes mellitus with diabetic autonomic (poly)neuropathy: Secondary | ICD-10-CM | POA: Diagnosis not present

## 2018-08-26 DIAGNOSIS — D649 Anemia, unspecified: Secondary | ICD-10-CM | POA: Diagnosis not present

## 2018-08-29 DIAGNOSIS — E7800 Pure hypercholesterolemia, unspecified: Secondary | ICD-10-CM | POA: Diagnosis not present

## 2018-08-29 DIAGNOSIS — Z794 Long term (current) use of insulin: Secondary | ICD-10-CM | POA: Diagnosis not present

## 2018-08-29 DIAGNOSIS — K760 Fatty (change of) liver, not elsewhere classified: Secondary | ICD-10-CM | POA: Diagnosis not present

## 2018-08-29 DIAGNOSIS — Z9049 Acquired absence of other specified parts of digestive tract: Secondary | ICD-10-CM | POA: Diagnosis not present

## 2018-08-29 DIAGNOSIS — D509 Iron deficiency anemia, unspecified: Secondary | ICD-10-CM | POA: Diagnosis not present

## 2018-08-29 DIAGNOSIS — L89612 Pressure ulcer of right heel, stage 2: Secondary | ICD-10-CM | POA: Diagnosis not present

## 2018-08-29 DIAGNOSIS — J9611 Chronic respiratory failure with hypoxia: Secondary | ICD-10-CM | POA: Diagnosis not present

## 2018-08-29 DIAGNOSIS — M159 Polyosteoarthritis, unspecified: Secondary | ICD-10-CM | POA: Diagnosis not present

## 2018-08-29 DIAGNOSIS — K219 Gastro-esophageal reflux disease without esophagitis: Secondary | ICD-10-CM | POA: Diagnosis not present

## 2018-08-29 DIAGNOSIS — M8580 Other specified disorders of bone density and structure, unspecified site: Secondary | ICD-10-CM | POA: Diagnosis not present

## 2018-08-29 DIAGNOSIS — E1151 Type 2 diabetes mellitus with diabetic peripheral angiopathy without gangrene: Secondary | ICD-10-CM | POA: Diagnosis not present

## 2018-08-29 DIAGNOSIS — I110 Hypertensive heart disease with heart failure: Secondary | ICD-10-CM | POA: Diagnosis not present

## 2018-08-29 DIAGNOSIS — Z9981 Dependence on supplemental oxygen: Secondary | ICD-10-CM | POA: Diagnosis not present

## 2018-08-29 DIAGNOSIS — Z87440 Personal history of urinary (tract) infections: Secondary | ICD-10-CM | POA: Diagnosis not present

## 2018-08-29 DIAGNOSIS — I4510 Unspecified right bundle-branch block: Secondary | ICD-10-CM | POA: Diagnosis not present

## 2018-08-29 DIAGNOSIS — I509 Heart failure, unspecified: Secondary | ICD-10-CM | POA: Diagnosis not present

## 2018-08-29 DIAGNOSIS — J9612 Chronic respiratory failure with hypercapnia: Secondary | ICD-10-CM | POA: Diagnosis not present

## 2018-08-29 DIAGNOSIS — M109 Gout, unspecified: Secondary | ICD-10-CM | POA: Diagnosis not present

## 2018-08-29 DIAGNOSIS — G4733 Obstructive sleep apnea (adult) (pediatric): Secondary | ICD-10-CM | POA: Diagnosis not present

## 2018-08-29 DIAGNOSIS — Z7982 Long term (current) use of aspirin: Secondary | ICD-10-CM | POA: Diagnosis not present

## 2018-08-29 DIAGNOSIS — Z87891 Personal history of nicotine dependence: Secondary | ICD-10-CM | POA: Diagnosis not present

## 2018-08-29 DIAGNOSIS — E1142 Type 2 diabetes mellitus with diabetic polyneuropathy: Secondary | ICD-10-CM | POA: Diagnosis not present

## 2018-08-29 DIAGNOSIS — E785 Hyperlipidemia, unspecified: Secondary | ICD-10-CM | POA: Diagnosis not present

## 2018-08-29 DIAGNOSIS — J449 Chronic obstructive pulmonary disease, unspecified: Secondary | ICD-10-CM | POA: Diagnosis not present

## 2018-08-30 DIAGNOSIS — I110 Hypertensive heart disease with heart failure: Secondary | ICD-10-CM | POA: Diagnosis not present

## 2018-08-30 DIAGNOSIS — I509 Heart failure, unspecified: Secondary | ICD-10-CM | POA: Diagnosis not present

## 2018-08-30 DIAGNOSIS — Z794 Long term (current) use of insulin: Secondary | ICD-10-CM | POA: Diagnosis not present

## 2018-08-30 DIAGNOSIS — E785 Hyperlipidemia, unspecified: Secondary | ICD-10-CM | POA: Diagnosis not present

## 2018-08-30 DIAGNOSIS — G4733 Obstructive sleep apnea (adult) (pediatric): Secondary | ICD-10-CM | POA: Diagnosis not present

## 2018-08-30 DIAGNOSIS — E7800 Pure hypercholesterolemia, unspecified: Secondary | ICD-10-CM | POA: Diagnosis not present

## 2018-08-30 DIAGNOSIS — Z87440 Personal history of urinary (tract) infections: Secondary | ICD-10-CM | POA: Diagnosis not present

## 2018-08-30 DIAGNOSIS — K219 Gastro-esophageal reflux disease without esophagitis: Secondary | ICD-10-CM | POA: Diagnosis not present

## 2018-08-30 DIAGNOSIS — M159 Polyosteoarthritis, unspecified: Secondary | ICD-10-CM | POA: Diagnosis not present

## 2018-08-30 DIAGNOSIS — Z9049 Acquired absence of other specified parts of digestive tract: Secondary | ICD-10-CM | POA: Diagnosis not present

## 2018-08-30 DIAGNOSIS — J9611 Chronic respiratory failure with hypoxia: Secondary | ICD-10-CM | POA: Diagnosis not present

## 2018-08-30 DIAGNOSIS — D509 Iron deficiency anemia, unspecified: Secondary | ICD-10-CM | POA: Diagnosis not present

## 2018-08-30 DIAGNOSIS — Z9981 Dependence on supplemental oxygen: Secondary | ICD-10-CM | POA: Diagnosis not present

## 2018-08-30 DIAGNOSIS — E1151 Type 2 diabetes mellitus with diabetic peripheral angiopathy without gangrene: Secondary | ICD-10-CM | POA: Diagnosis not present

## 2018-08-30 DIAGNOSIS — J9612 Chronic respiratory failure with hypercapnia: Secondary | ICD-10-CM | POA: Diagnosis not present

## 2018-08-30 DIAGNOSIS — I4510 Unspecified right bundle-branch block: Secondary | ICD-10-CM | POA: Diagnosis not present

## 2018-08-30 DIAGNOSIS — M109 Gout, unspecified: Secondary | ICD-10-CM | POA: Diagnosis not present

## 2018-08-30 DIAGNOSIS — L89612 Pressure ulcer of right heel, stage 2: Secondary | ICD-10-CM | POA: Diagnosis not present

## 2018-08-30 DIAGNOSIS — M8580 Other specified disorders of bone density and structure, unspecified site: Secondary | ICD-10-CM | POA: Diagnosis not present

## 2018-08-30 DIAGNOSIS — J449 Chronic obstructive pulmonary disease, unspecified: Secondary | ICD-10-CM | POA: Diagnosis not present

## 2018-08-30 DIAGNOSIS — K760 Fatty (change of) liver, not elsewhere classified: Secondary | ICD-10-CM | POA: Diagnosis not present

## 2018-08-30 DIAGNOSIS — E1142 Type 2 diabetes mellitus with diabetic polyneuropathy: Secondary | ICD-10-CM | POA: Diagnosis not present

## 2018-08-30 DIAGNOSIS — Z7982 Long term (current) use of aspirin: Secondary | ICD-10-CM | POA: Diagnosis not present

## 2018-08-30 DIAGNOSIS — Z87891 Personal history of nicotine dependence: Secondary | ICD-10-CM | POA: Diagnosis not present

## 2018-09-01 DIAGNOSIS — Z7982 Long term (current) use of aspirin: Secondary | ICD-10-CM | POA: Diagnosis not present

## 2018-09-01 DIAGNOSIS — Z87891 Personal history of nicotine dependence: Secondary | ICD-10-CM | POA: Diagnosis not present

## 2018-09-01 DIAGNOSIS — G4733 Obstructive sleep apnea (adult) (pediatric): Secondary | ICD-10-CM | POA: Diagnosis not present

## 2018-09-01 DIAGNOSIS — L89612 Pressure ulcer of right heel, stage 2: Secondary | ICD-10-CM | POA: Diagnosis not present

## 2018-09-01 DIAGNOSIS — J9612 Chronic respiratory failure with hypercapnia: Secondary | ICD-10-CM | POA: Diagnosis not present

## 2018-09-01 DIAGNOSIS — E1151 Type 2 diabetes mellitus with diabetic peripheral angiopathy without gangrene: Secondary | ICD-10-CM | POA: Diagnosis not present

## 2018-09-01 DIAGNOSIS — D509 Iron deficiency anemia, unspecified: Secondary | ICD-10-CM | POA: Diagnosis not present

## 2018-09-01 DIAGNOSIS — E1142 Type 2 diabetes mellitus with diabetic polyneuropathy: Secondary | ICD-10-CM | POA: Diagnosis not present

## 2018-09-01 DIAGNOSIS — Z9049 Acquired absence of other specified parts of digestive tract: Secondary | ICD-10-CM | POA: Diagnosis not present

## 2018-09-01 DIAGNOSIS — Z9981 Dependence on supplemental oxygen: Secondary | ICD-10-CM | POA: Diagnosis not present

## 2018-09-01 DIAGNOSIS — Z87440 Personal history of urinary (tract) infections: Secondary | ICD-10-CM | POA: Diagnosis not present

## 2018-09-01 DIAGNOSIS — Z794 Long term (current) use of insulin: Secondary | ICD-10-CM | POA: Diagnosis not present

## 2018-09-01 DIAGNOSIS — M8580 Other specified disorders of bone density and structure, unspecified site: Secondary | ICD-10-CM | POA: Diagnosis not present

## 2018-09-01 DIAGNOSIS — E7800 Pure hypercholesterolemia, unspecified: Secondary | ICD-10-CM | POA: Diagnosis not present

## 2018-09-01 DIAGNOSIS — I509 Heart failure, unspecified: Secondary | ICD-10-CM | POA: Diagnosis not present

## 2018-09-01 DIAGNOSIS — M109 Gout, unspecified: Secondary | ICD-10-CM | POA: Diagnosis not present

## 2018-09-01 DIAGNOSIS — K219 Gastro-esophageal reflux disease without esophagitis: Secondary | ICD-10-CM | POA: Diagnosis not present

## 2018-09-01 DIAGNOSIS — J9611 Chronic respiratory failure with hypoxia: Secondary | ICD-10-CM | POA: Diagnosis not present

## 2018-09-01 DIAGNOSIS — I110 Hypertensive heart disease with heart failure: Secondary | ICD-10-CM | POA: Diagnosis not present

## 2018-09-01 DIAGNOSIS — K760 Fatty (change of) liver, not elsewhere classified: Secondary | ICD-10-CM | POA: Diagnosis not present

## 2018-09-01 DIAGNOSIS — I4510 Unspecified right bundle-branch block: Secondary | ICD-10-CM | POA: Diagnosis not present

## 2018-09-01 DIAGNOSIS — M159 Polyosteoarthritis, unspecified: Secondary | ICD-10-CM | POA: Diagnosis not present

## 2018-09-01 DIAGNOSIS — J449 Chronic obstructive pulmonary disease, unspecified: Secondary | ICD-10-CM | POA: Diagnosis not present

## 2018-09-01 DIAGNOSIS — E785 Hyperlipidemia, unspecified: Secondary | ICD-10-CM | POA: Diagnosis not present

## 2018-09-02 DIAGNOSIS — N183 Chronic kidney disease, stage 3 (moderate): Secondary | ICD-10-CM | POA: Diagnosis not present

## 2018-09-02 DIAGNOSIS — Z743 Need for continuous supervision: Secondary | ICD-10-CM | POA: Diagnosis not present

## 2018-09-02 DIAGNOSIS — J9612 Chronic respiratory failure with hypercapnia: Secondary | ICD-10-CM | POA: Diagnosis not present

## 2018-09-02 DIAGNOSIS — I517 Cardiomegaly: Secondary | ICD-10-CM | POA: Diagnosis not present

## 2018-09-02 DIAGNOSIS — N179 Acute kidney failure, unspecified: Secondary | ICD-10-CM | POA: Diagnosis not present

## 2018-09-02 DIAGNOSIS — I509 Heart failure, unspecified: Secondary | ICD-10-CM | POA: Diagnosis not present

## 2018-09-02 DIAGNOSIS — E1142 Type 2 diabetes mellitus with diabetic polyneuropathy: Secondary | ICD-10-CM | POA: Diagnosis not present

## 2018-09-02 DIAGNOSIS — M8580 Other specified disorders of bone density and structure, unspecified site: Secondary | ICD-10-CM | POA: Diagnosis not present

## 2018-09-02 DIAGNOSIS — E1159 Type 2 diabetes mellitus with other circulatory complications: Secondary | ICD-10-CM | POA: Diagnosis not present

## 2018-09-02 DIAGNOSIS — Z794 Long term (current) use of insulin: Secondary | ICD-10-CM | POA: Diagnosis not present

## 2018-09-02 DIAGNOSIS — D638 Anemia in other chronic diseases classified elsewhere: Secondary | ICD-10-CM | POA: Diagnosis not present

## 2018-09-02 DIAGNOSIS — M1990 Unspecified osteoarthritis, unspecified site: Secondary | ICD-10-CM | POA: Diagnosis not present

## 2018-09-02 DIAGNOSIS — E7800 Pure hypercholesterolemia, unspecified: Secondary | ICD-10-CM | POA: Diagnosis not present

## 2018-09-02 DIAGNOSIS — R0602 Shortness of breath: Secondary | ICD-10-CM | POA: Diagnosis not present

## 2018-09-02 DIAGNOSIS — I10 Essential (primary) hypertension: Secondary | ICD-10-CM | POA: Diagnosis not present

## 2018-09-02 DIAGNOSIS — J9621 Acute and chronic respiratory failure with hypoxia: Secondary | ICD-10-CM | POA: Diagnosis not present

## 2018-09-02 DIAGNOSIS — R0989 Other specified symptoms and signs involving the circulatory and respiratory systems: Secondary | ICD-10-CM | POA: Diagnosis not present

## 2018-09-02 DIAGNOSIS — K5900 Constipation, unspecified: Secondary | ICD-10-CM | POA: Diagnosis not present

## 2018-09-02 DIAGNOSIS — E871 Hypo-osmolality and hyponatremia: Secondary | ICD-10-CM | POA: Diagnosis not present

## 2018-09-02 DIAGNOSIS — Z79899 Other long term (current) drug therapy: Secondary | ICD-10-CM | POA: Diagnosis not present

## 2018-09-02 DIAGNOSIS — R262 Difficulty in walking, not elsewhere classified: Secondary | ICD-10-CM | POA: Diagnosis not present

## 2018-09-02 DIAGNOSIS — Z7982 Long term (current) use of aspirin: Secondary | ICD-10-CM | POA: Diagnosis not present

## 2018-09-02 DIAGNOSIS — E875 Hyperkalemia: Secondary | ICD-10-CM | POA: Diagnosis not present

## 2018-09-02 DIAGNOSIS — M109 Gout, unspecified: Secondary | ICD-10-CM | POA: Diagnosis not present

## 2018-09-02 DIAGNOSIS — Z03818 Encounter for observation for suspected exposure to other biological agents ruled out: Secondary | ICD-10-CM | POA: Diagnosis not present

## 2018-09-02 DIAGNOSIS — Z9981 Dependence on supplemental oxygen: Secondary | ICD-10-CM | POA: Diagnosis not present

## 2018-09-02 DIAGNOSIS — R0902 Hypoxemia: Secondary | ICD-10-CM | POA: Diagnosis not present

## 2018-09-02 DIAGNOSIS — Z7401 Bed confinement status: Secondary | ICD-10-CM | POA: Diagnosis not present

## 2018-09-02 DIAGNOSIS — M6281 Muscle weakness (generalized): Secondary | ICD-10-CM | POA: Diagnosis not present

## 2018-09-02 DIAGNOSIS — Z87891 Personal history of nicotine dependence: Secondary | ICD-10-CM | POA: Diagnosis not present

## 2018-09-02 DIAGNOSIS — K760 Fatty (change of) liver, not elsewhere classified: Secondary | ICD-10-CM | POA: Diagnosis not present

## 2018-09-02 DIAGNOSIS — E1165 Type 2 diabetes mellitus with hyperglycemia: Secondary | ICD-10-CM | POA: Diagnosis not present

## 2018-09-02 DIAGNOSIS — L89612 Pressure ulcer of right heel, stage 2: Secondary | ICD-10-CM | POA: Diagnosis not present

## 2018-09-02 DIAGNOSIS — R279 Unspecified lack of coordination: Secondary | ICD-10-CM | POA: Diagnosis not present

## 2018-09-02 DIAGNOSIS — R5381 Other malaise: Secondary | ICD-10-CM | POA: Diagnosis not present

## 2018-09-02 DIAGNOSIS — Z9049 Acquired absence of other specified parts of digestive tract: Secondary | ICD-10-CM | POA: Diagnosis not present

## 2018-09-02 DIAGNOSIS — Z87440 Personal history of urinary (tract) infections: Secondary | ICD-10-CM | POA: Diagnosis not present

## 2018-09-02 DIAGNOSIS — E1151 Type 2 diabetes mellitus with diabetic peripheral angiopathy without gangrene: Secondary | ICD-10-CM | POA: Diagnosis not present

## 2018-09-02 DIAGNOSIS — R609 Edema, unspecified: Secondary | ICD-10-CM | POA: Diagnosis not present

## 2018-09-02 DIAGNOSIS — K219 Gastro-esophageal reflux disease without esophagitis: Secondary | ICD-10-CM | POA: Diagnosis not present

## 2018-09-02 DIAGNOSIS — I272 Pulmonary hypertension, unspecified: Secondary | ICD-10-CM | POA: Diagnosis not present

## 2018-09-02 DIAGNOSIS — J449 Chronic obstructive pulmonary disease, unspecified: Secondary | ICD-10-CM | POA: Diagnosis not present

## 2018-09-02 DIAGNOSIS — M159 Polyosteoarthritis, unspecified: Secondary | ICD-10-CM | POA: Diagnosis not present

## 2018-09-02 DIAGNOSIS — R531 Weakness: Secondary | ICD-10-CM | POA: Diagnosis not present

## 2018-09-02 DIAGNOSIS — I4510 Unspecified right bundle-branch block: Secondary | ICD-10-CM | POA: Diagnosis not present

## 2018-09-02 DIAGNOSIS — I5033 Acute on chronic diastolic (congestive) heart failure: Secondary | ICD-10-CM | POA: Diagnosis not present

## 2018-09-02 DIAGNOSIS — G4733 Obstructive sleep apnea (adult) (pediatric): Secondary | ICD-10-CM | POA: Diagnosis not present

## 2018-09-02 DIAGNOSIS — J9611 Chronic respiratory failure with hypoxia: Secondary | ICD-10-CM | POA: Diagnosis not present

## 2018-09-02 DIAGNOSIS — D509 Iron deficiency anemia, unspecified: Secondary | ICD-10-CM | POA: Diagnosis not present

## 2018-09-02 DIAGNOSIS — I110 Hypertensive heart disease with heart failure: Secondary | ICD-10-CM | POA: Diagnosis not present

## 2018-09-02 DIAGNOSIS — D649 Anemia, unspecified: Secondary | ICD-10-CM | POA: Diagnosis not present

## 2018-09-02 DIAGNOSIS — I5032 Chronic diastolic (congestive) heart failure: Secondary | ICD-10-CM | POA: Diagnosis not present

## 2018-09-02 DIAGNOSIS — L89892 Pressure ulcer of other site, stage 2: Secondary | ICD-10-CM | POA: Diagnosis not present

## 2018-09-02 DIAGNOSIS — R600 Localized edema: Secondary | ICD-10-CM | POA: Diagnosis not present

## 2018-09-02 DIAGNOSIS — E785 Hyperlipidemia, unspecified: Secondary | ICD-10-CM | POA: Diagnosis not present

## 2018-09-02 DIAGNOSIS — E1122 Type 2 diabetes mellitus with diabetic chronic kidney disease: Secondary | ICD-10-CM | POA: Diagnosis not present

## 2018-09-02 DIAGNOSIS — I130 Hypertensive heart and chronic kidney disease with heart failure and stage 1 through stage 4 chronic kidney disease, or unspecified chronic kidney disease: Secondary | ICD-10-CM | POA: Diagnosis not present

## 2018-09-07 DIAGNOSIS — R600 Localized edema: Secondary | ICD-10-CM | POA: Diagnosis not present

## 2018-09-07 DIAGNOSIS — D509 Iron deficiency anemia, unspecified: Secondary | ICD-10-CM | POA: Diagnosis not present

## 2018-09-07 DIAGNOSIS — M6281 Muscle weakness (generalized): Secondary | ICD-10-CM | POA: Diagnosis not present

## 2018-09-07 DIAGNOSIS — Z743 Need for continuous supervision: Secondary | ICD-10-CM | POA: Diagnosis not present

## 2018-09-07 DIAGNOSIS — I5032 Chronic diastolic (congestive) heart failure: Secondary | ICD-10-CM | POA: Diagnosis not present

## 2018-09-07 DIAGNOSIS — M1990 Unspecified osteoarthritis, unspecified site: Secondary | ICD-10-CM | POA: Diagnosis not present

## 2018-09-07 DIAGNOSIS — Z03818 Encounter for observation for suspected exposure to other biological agents ruled out: Secondary | ICD-10-CM | POA: Diagnosis not present

## 2018-09-07 DIAGNOSIS — R0902 Hypoxemia: Secondary | ICD-10-CM | POA: Diagnosis not present

## 2018-09-07 DIAGNOSIS — L89892 Pressure ulcer of other site, stage 2: Secondary | ICD-10-CM | POA: Diagnosis not present

## 2018-09-07 DIAGNOSIS — I272 Pulmonary hypertension, unspecified: Secondary | ICD-10-CM | POA: Diagnosis not present

## 2018-09-07 DIAGNOSIS — E7800 Pure hypercholesterolemia, unspecified: Secondary | ICD-10-CM | POA: Diagnosis not present

## 2018-09-07 DIAGNOSIS — K5900 Constipation, unspecified: Secondary | ICD-10-CM | POA: Diagnosis not present

## 2018-09-07 DIAGNOSIS — R279 Unspecified lack of coordination: Secondary | ICD-10-CM | POA: Diagnosis not present

## 2018-09-07 DIAGNOSIS — N183 Chronic kidney disease, stage 3 (moderate): Secondary | ICD-10-CM | POA: Diagnosis not present

## 2018-09-07 DIAGNOSIS — R5381 Other malaise: Secondary | ICD-10-CM | POA: Diagnosis not present

## 2018-09-07 DIAGNOSIS — Z1159 Encounter for screening for other viral diseases: Secondary | ICD-10-CM | POA: Diagnosis not present

## 2018-09-07 DIAGNOSIS — K219 Gastro-esophageal reflux disease without esophagitis: Secondary | ICD-10-CM | POA: Diagnosis not present

## 2018-09-07 DIAGNOSIS — R262 Difficulty in walking, not elsewhere classified: Secondary | ICD-10-CM | POA: Diagnosis not present

## 2018-09-07 DIAGNOSIS — N179 Acute kidney failure, unspecified: Secondary | ICD-10-CM | POA: Diagnosis not present

## 2018-09-07 DIAGNOSIS — M109 Gout, unspecified: Secondary | ICD-10-CM | POA: Diagnosis not present

## 2018-09-07 DIAGNOSIS — I509 Heart failure, unspecified: Secondary | ICD-10-CM | POA: Diagnosis not present

## 2018-09-07 DIAGNOSIS — I10 Essential (primary) hypertension: Secondary | ICD-10-CM | POA: Diagnosis not present

## 2018-09-07 DIAGNOSIS — E1159 Type 2 diabetes mellitus with other circulatory complications: Secondary | ICD-10-CM | POA: Diagnosis not present

## 2018-09-07 DIAGNOSIS — J449 Chronic obstructive pulmonary disease, unspecified: Secondary | ICD-10-CM | POA: Diagnosis not present

## 2018-09-09 DIAGNOSIS — I10 Essential (primary) hypertension: Secondary | ICD-10-CM | POA: Diagnosis not present

## 2018-09-09 DIAGNOSIS — N183 Chronic kidney disease, stage 3 (moderate): Secondary | ICD-10-CM | POA: Diagnosis not present

## 2018-09-09 DIAGNOSIS — J449 Chronic obstructive pulmonary disease, unspecified: Secondary | ICD-10-CM | POA: Diagnosis not present

## 2018-09-09 DIAGNOSIS — D509 Iron deficiency anemia, unspecified: Secondary | ICD-10-CM | POA: Diagnosis not present

## 2018-09-09 DIAGNOSIS — I5032 Chronic diastolic (congestive) heart failure: Secondary | ICD-10-CM | POA: Diagnosis not present

## 2018-09-12 DIAGNOSIS — Z03818 Encounter for observation for suspected exposure to other biological agents ruled out: Secondary | ICD-10-CM | POA: Diagnosis not present

## 2018-09-15 DIAGNOSIS — E1159 Type 2 diabetes mellitus with other circulatory complications: Secondary | ICD-10-CM | POA: Diagnosis not present

## 2018-09-15 DIAGNOSIS — L89892 Pressure ulcer of other site, stage 2: Secondary | ICD-10-CM | POA: Diagnosis not present

## 2018-09-15 DIAGNOSIS — I5032 Chronic diastolic (congestive) heart failure: Secondary | ICD-10-CM | POA: Diagnosis not present

## 2018-09-15 DIAGNOSIS — J449 Chronic obstructive pulmonary disease, unspecified: Secondary | ICD-10-CM | POA: Diagnosis not present

## 2018-09-26 DIAGNOSIS — Z1159 Encounter for screening for other viral diseases: Secondary | ICD-10-CM | POA: Diagnosis not present

## 2018-09-28 DIAGNOSIS — I5030 Unspecified diastolic (congestive) heart failure: Secondary | ICD-10-CM | POA: Diagnosis not present

## 2018-09-28 DIAGNOSIS — D649 Anemia, unspecified: Secondary | ICD-10-CM | POA: Diagnosis not present

## 2018-09-28 DIAGNOSIS — J449 Chronic obstructive pulmonary disease, unspecified: Secondary | ICD-10-CM | POA: Diagnosis not present

## 2018-09-28 DIAGNOSIS — E1343 Other specified diabetes mellitus with diabetic autonomic (poly)neuropathy: Secondary | ICD-10-CM | POA: Diagnosis not present

## 2018-09-28 DIAGNOSIS — I4510 Unspecified right bundle-branch block: Secondary | ICD-10-CM | POA: Diagnosis not present

## 2018-09-29 DIAGNOSIS — I509 Heart failure, unspecified: Secondary | ICD-10-CM | POA: Diagnosis not present

## 2018-09-29 DIAGNOSIS — Z9981 Dependence on supplemental oxygen: Secondary | ICD-10-CM | POA: Diagnosis not present

## 2018-09-29 DIAGNOSIS — E1151 Type 2 diabetes mellitus with diabetic peripheral angiopathy without gangrene: Secondary | ICD-10-CM | POA: Diagnosis not present

## 2018-09-29 DIAGNOSIS — M8580 Other specified disorders of bone density and structure, unspecified site: Secondary | ICD-10-CM | POA: Diagnosis not present

## 2018-09-29 DIAGNOSIS — M109 Gout, unspecified: Secondary | ICD-10-CM | POA: Diagnosis not present

## 2018-09-29 DIAGNOSIS — Z794 Long term (current) use of insulin: Secondary | ICD-10-CM | POA: Diagnosis not present

## 2018-09-29 DIAGNOSIS — D509 Iron deficiency anemia, unspecified: Secondary | ICD-10-CM | POA: Diagnosis not present

## 2018-09-29 DIAGNOSIS — I4510 Unspecified right bundle-branch block: Secondary | ICD-10-CM | POA: Diagnosis not present

## 2018-09-29 DIAGNOSIS — J9612 Chronic respiratory failure with hypercapnia: Secondary | ICD-10-CM | POA: Diagnosis not present

## 2018-09-29 DIAGNOSIS — I110 Hypertensive heart disease with heart failure: Secondary | ICD-10-CM | POA: Diagnosis not present

## 2018-09-29 DIAGNOSIS — J449 Chronic obstructive pulmonary disease, unspecified: Secondary | ICD-10-CM | POA: Diagnosis not present

## 2018-09-29 DIAGNOSIS — E785 Hyperlipidemia, unspecified: Secondary | ICD-10-CM | POA: Diagnosis not present

## 2018-09-29 DIAGNOSIS — M159 Polyosteoarthritis, unspecified: Secondary | ICD-10-CM | POA: Diagnosis not present

## 2018-09-29 DIAGNOSIS — Z7982 Long term (current) use of aspirin: Secondary | ICD-10-CM | POA: Diagnosis not present

## 2018-09-29 DIAGNOSIS — Z87891 Personal history of nicotine dependence: Secondary | ICD-10-CM | POA: Diagnosis not present

## 2018-09-29 DIAGNOSIS — Z87440 Personal history of urinary (tract) infections: Secondary | ICD-10-CM | POA: Diagnosis not present

## 2018-09-29 DIAGNOSIS — G4733 Obstructive sleep apnea (adult) (pediatric): Secondary | ICD-10-CM | POA: Diagnosis not present

## 2018-09-29 DIAGNOSIS — L89612 Pressure ulcer of right heel, stage 2: Secondary | ICD-10-CM | POA: Diagnosis not present

## 2018-09-29 DIAGNOSIS — J9611 Chronic respiratory failure with hypoxia: Secondary | ICD-10-CM | POA: Diagnosis not present

## 2018-09-29 DIAGNOSIS — Z9049 Acquired absence of other specified parts of digestive tract: Secondary | ICD-10-CM | POA: Diagnosis not present

## 2018-09-29 DIAGNOSIS — K219 Gastro-esophageal reflux disease without esophagitis: Secondary | ICD-10-CM | POA: Diagnosis not present

## 2018-09-29 DIAGNOSIS — E7800 Pure hypercholesterolemia, unspecified: Secondary | ICD-10-CM | POA: Diagnosis not present

## 2018-09-29 DIAGNOSIS — K760 Fatty (change of) liver, not elsewhere classified: Secondary | ICD-10-CM | POA: Diagnosis not present

## 2018-09-29 DIAGNOSIS — E1142 Type 2 diabetes mellitus with diabetic polyneuropathy: Secondary | ICD-10-CM | POA: Diagnosis not present

## 2018-09-30 DIAGNOSIS — Z794 Long term (current) use of insulin: Secondary | ICD-10-CM | POA: Diagnosis not present

## 2018-09-30 DIAGNOSIS — J9612 Chronic respiratory failure with hypercapnia: Secondary | ICD-10-CM | POA: Diagnosis not present

## 2018-09-30 DIAGNOSIS — E1142 Type 2 diabetes mellitus with diabetic polyneuropathy: Secondary | ICD-10-CM | POA: Diagnosis not present

## 2018-09-30 DIAGNOSIS — K760 Fatty (change of) liver, not elsewhere classified: Secondary | ICD-10-CM | POA: Diagnosis not present

## 2018-09-30 DIAGNOSIS — G4733 Obstructive sleep apnea (adult) (pediatric): Secondary | ICD-10-CM | POA: Diagnosis not present

## 2018-09-30 DIAGNOSIS — J9611 Chronic respiratory failure with hypoxia: Secondary | ICD-10-CM | POA: Diagnosis not present

## 2018-09-30 DIAGNOSIS — E1151 Type 2 diabetes mellitus with diabetic peripheral angiopathy without gangrene: Secondary | ICD-10-CM | POA: Diagnosis not present

## 2018-09-30 DIAGNOSIS — Z87440 Personal history of urinary (tract) infections: Secondary | ICD-10-CM | POA: Diagnosis not present

## 2018-09-30 DIAGNOSIS — Z7982 Long term (current) use of aspirin: Secondary | ICD-10-CM | POA: Diagnosis not present

## 2018-09-30 DIAGNOSIS — E7800 Pure hypercholesterolemia, unspecified: Secondary | ICD-10-CM | POA: Diagnosis not present

## 2018-09-30 DIAGNOSIS — E785 Hyperlipidemia, unspecified: Secondary | ICD-10-CM | POA: Diagnosis not present

## 2018-09-30 DIAGNOSIS — M159 Polyosteoarthritis, unspecified: Secondary | ICD-10-CM | POA: Diagnosis not present

## 2018-09-30 DIAGNOSIS — I4510 Unspecified right bundle-branch block: Secondary | ICD-10-CM | POA: Diagnosis not present

## 2018-09-30 DIAGNOSIS — Z9981 Dependence on supplemental oxygen: Secondary | ICD-10-CM | POA: Diagnosis not present

## 2018-09-30 DIAGNOSIS — M109 Gout, unspecified: Secondary | ICD-10-CM | POA: Diagnosis not present

## 2018-09-30 DIAGNOSIS — J449 Chronic obstructive pulmonary disease, unspecified: Secondary | ICD-10-CM | POA: Diagnosis not present

## 2018-09-30 DIAGNOSIS — D509 Iron deficiency anemia, unspecified: Secondary | ICD-10-CM | POA: Diagnosis not present

## 2018-09-30 DIAGNOSIS — M8580 Other specified disorders of bone density and structure, unspecified site: Secondary | ICD-10-CM | POA: Diagnosis not present

## 2018-09-30 DIAGNOSIS — I110 Hypertensive heart disease with heart failure: Secondary | ICD-10-CM | POA: Diagnosis not present

## 2018-09-30 DIAGNOSIS — K219 Gastro-esophageal reflux disease without esophagitis: Secondary | ICD-10-CM | POA: Diagnosis not present

## 2018-09-30 DIAGNOSIS — L89612 Pressure ulcer of right heel, stage 2: Secondary | ICD-10-CM | POA: Diagnosis not present

## 2018-09-30 DIAGNOSIS — Z9049 Acquired absence of other specified parts of digestive tract: Secondary | ICD-10-CM | POA: Diagnosis not present

## 2018-09-30 DIAGNOSIS — Z87891 Personal history of nicotine dependence: Secondary | ICD-10-CM | POA: Diagnosis not present

## 2018-09-30 DIAGNOSIS — I509 Heart failure, unspecified: Secondary | ICD-10-CM | POA: Diagnosis not present

## 2018-10-04 DIAGNOSIS — Z87440 Personal history of urinary (tract) infections: Secondary | ICD-10-CM | POA: Diagnosis not present

## 2018-10-04 DIAGNOSIS — E1151 Type 2 diabetes mellitus with diabetic peripheral angiopathy without gangrene: Secondary | ICD-10-CM | POA: Diagnosis not present

## 2018-10-04 DIAGNOSIS — E7800 Pure hypercholesterolemia, unspecified: Secondary | ICD-10-CM | POA: Diagnosis not present

## 2018-10-04 DIAGNOSIS — M159 Polyosteoarthritis, unspecified: Secondary | ICD-10-CM | POA: Diagnosis not present

## 2018-10-04 DIAGNOSIS — E1142 Type 2 diabetes mellitus with diabetic polyneuropathy: Secondary | ICD-10-CM | POA: Diagnosis not present

## 2018-10-04 DIAGNOSIS — L89612 Pressure ulcer of right heel, stage 2: Secondary | ICD-10-CM | POA: Diagnosis not present

## 2018-10-04 DIAGNOSIS — D509 Iron deficiency anemia, unspecified: Secondary | ICD-10-CM | POA: Diagnosis not present

## 2018-10-04 DIAGNOSIS — I4510 Unspecified right bundle-branch block: Secondary | ICD-10-CM | POA: Diagnosis not present

## 2018-10-04 DIAGNOSIS — Z9049 Acquired absence of other specified parts of digestive tract: Secondary | ICD-10-CM | POA: Diagnosis not present

## 2018-10-04 DIAGNOSIS — Z9981 Dependence on supplemental oxygen: Secondary | ICD-10-CM | POA: Diagnosis not present

## 2018-10-04 DIAGNOSIS — J449 Chronic obstructive pulmonary disease, unspecified: Secondary | ICD-10-CM | POA: Diagnosis not present

## 2018-10-04 DIAGNOSIS — M8580 Other specified disorders of bone density and structure, unspecified site: Secondary | ICD-10-CM | POA: Diagnosis not present

## 2018-10-04 DIAGNOSIS — M109 Gout, unspecified: Secondary | ICD-10-CM | POA: Diagnosis not present

## 2018-10-04 DIAGNOSIS — G4733 Obstructive sleep apnea (adult) (pediatric): Secondary | ICD-10-CM | POA: Diagnosis not present

## 2018-10-04 DIAGNOSIS — Z794 Long term (current) use of insulin: Secondary | ICD-10-CM | POA: Diagnosis not present

## 2018-10-04 DIAGNOSIS — I110 Hypertensive heart disease with heart failure: Secondary | ICD-10-CM | POA: Diagnosis not present

## 2018-10-04 DIAGNOSIS — E785 Hyperlipidemia, unspecified: Secondary | ICD-10-CM | POA: Diagnosis not present

## 2018-10-04 DIAGNOSIS — K219 Gastro-esophageal reflux disease without esophagitis: Secondary | ICD-10-CM | POA: Diagnosis not present

## 2018-10-04 DIAGNOSIS — K760 Fatty (change of) liver, not elsewhere classified: Secondary | ICD-10-CM | POA: Diagnosis not present

## 2018-10-04 DIAGNOSIS — E1343 Other specified diabetes mellitus with diabetic autonomic (poly)neuropathy: Secondary | ICD-10-CM | POA: Diagnosis not present

## 2018-10-04 DIAGNOSIS — J9612 Chronic respiratory failure with hypercapnia: Secondary | ICD-10-CM | POA: Diagnosis not present

## 2018-10-04 DIAGNOSIS — D649 Anemia, unspecified: Secondary | ICD-10-CM | POA: Diagnosis not present

## 2018-10-04 DIAGNOSIS — J9611 Chronic respiratory failure with hypoxia: Secondary | ICD-10-CM | POA: Diagnosis not present

## 2018-10-04 DIAGNOSIS — I509 Heart failure, unspecified: Secondary | ICD-10-CM | POA: Diagnosis not present

## 2018-10-04 DIAGNOSIS — Z7982 Long term (current) use of aspirin: Secondary | ICD-10-CM | POA: Diagnosis not present

## 2018-10-04 DIAGNOSIS — Z87891 Personal history of nicotine dependence: Secondary | ICD-10-CM | POA: Diagnosis not present

## 2018-10-05 DIAGNOSIS — M109 Gout, unspecified: Secondary | ICD-10-CM | POA: Diagnosis not present

## 2018-10-05 DIAGNOSIS — J9611 Chronic respiratory failure with hypoxia: Secondary | ICD-10-CM | POA: Diagnosis not present

## 2018-10-05 DIAGNOSIS — Z7982 Long term (current) use of aspirin: Secondary | ICD-10-CM | POA: Diagnosis not present

## 2018-10-05 DIAGNOSIS — Z9981 Dependence on supplemental oxygen: Secondary | ICD-10-CM | POA: Diagnosis not present

## 2018-10-05 DIAGNOSIS — Z87891 Personal history of nicotine dependence: Secondary | ICD-10-CM | POA: Diagnosis not present

## 2018-10-05 DIAGNOSIS — J449 Chronic obstructive pulmonary disease, unspecified: Secondary | ICD-10-CM | POA: Diagnosis not present

## 2018-10-05 DIAGNOSIS — K760 Fatty (change of) liver, not elsewhere classified: Secondary | ICD-10-CM | POA: Diagnosis not present

## 2018-10-05 DIAGNOSIS — I110 Hypertensive heart disease with heart failure: Secondary | ICD-10-CM | POA: Diagnosis not present

## 2018-10-05 DIAGNOSIS — E7800 Pure hypercholesterolemia, unspecified: Secondary | ICD-10-CM | POA: Diagnosis not present

## 2018-10-05 DIAGNOSIS — D509 Iron deficiency anemia, unspecified: Secondary | ICD-10-CM | POA: Diagnosis not present

## 2018-10-05 DIAGNOSIS — G4733 Obstructive sleep apnea (adult) (pediatric): Secondary | ICD-10-CM | POA: Diagnosis not present

## 2018-10-05 DIAGNOSIS — E785 Hyperlipidemia, unspecified: Secondary | ICD-10-CM | POA: Diagnosis not present

## 2018-10-05 DIAGNOSIS — Z794 Long term (current) use of insulin: Secondary | ICD-10-CM | POA: Diagnosis not present

## 2018-10-05 DIAGNOSIS — E1151 Type 2 diabetes mellitus with diabetic peripheral angiopathy without gangrene: Secondary | ICD-10-CM | POA: Diagnosis not present

## 2018-10-05 DIAGNOSIS — I4510 Unspecified right bundle-branch block: Secondary | ICD-10-CM | POA: Diagnosis not present

## 2018-10-05 DIAGNOSIS — K219 Gastro-esophageal reflux disease without esophagitis: Secondary | ICD-10-CM | POA: Diagnosis not present

## 2018-10-05 DIAGNOSIS — L89610 Pressure ulcer of right heel, unstageable: Secondary | ICD-10-CM | POA: Diagnosis not present

## 2018-10-05 DIAGNOSIS — J9612 Chronic respiratory failure with hypercapnia: Secondary | ICD-10-CM | POA: Diagnosis not present

## 2018-10-05 DIAGNOSIS — L89612 Pressure ulcer of right heel, stage 2: Secondary | ICD-10-CM | POA: Diagnosis not present

## 2018-10-05 DIAGNOSIS — E1142 Type 2 diabetes mellitus with diabetic polyneuropathy: Secondary | ICD-10-CM | POA: Diagnosis not present

## 2018-10-05 DIAGNOSIS — Z87440 Personal history of urinary (tract) infections: Secondary | ICD-10-CM | POA: Diagnosis not present

## 2018-10-05 DIAGNOSIS — M159 Polyosteoarthritis, unspecified: Secondary | ICD-10-CM | POA: Diagnosis not present

## 2018-10-05 DIAGNOSIS — I509 Heart failure, unspecified: Secondary | ICD-10-CM | POA: Diagnosis not present

## 2018-10-05 DIAGNOSIS — M8580 Other specified disorders of bone density and structure, unspecified site: Secondary | ICD-10-CM | POA: Diagnosis not present

## 2018-10-06 DIAGNOSIS — I739 Peripheral vascular disease, unspecified: Secondary | ICD-10-CM | POA: Diagnosis not present

## 2018-10-06 DIAGNOSIS — L89610 Pressure ulcer of right heel, unstageable: Secondary | ICD-10-CM | POA: Diagnosis not present

## 2018-10-06 DIAGNOSIS — J449 Chronic obstructive pulmonary disease, unspecified: Secondary | ICD-10-CM | POA: Diagnosis not present

## 2018-10-06 DIAGNOSIS — L89613 Pressure ulcer of right heel, stage 3: Secondary | ICD-10-CM | POA: Diagnosis not present

## 2018-10-06 DIAGNOSIS — G4730 Sleep apnea, unspecified: Secondary | ICD-10-CM | POA: Diagnosis not present

## 2018-10-06 DIAGNOSIS — I872 Venous insufficiency (chronic) (peripheral): Secondary | ICD-10-CM | POA: Diagnosis not present

## 2018-10-06 DIAGNOSIS — I10 Essential (primary) hypertension: Secondary | ICD-10-CM | POA: Diagnosis not present

## 2018-10-06 DIAGNOSIS — I890 Lymphedema, not elsewhere classified: Secondary | ICD-10-CM | POA: Diagnosis not present

## 2018-10-06 DIAGNOSIS — E119 Type 2 diabetes mellitus without complications: Secondary | ICD-10-CM | POA: Diagnosis not present

## 2018-10-07 DIAGNOSIS — L89610 Pressure ulcer of right heel, unstageable: Secondary | ICD-10-CM | POA: Diagnosis not present

## 2018-10-07 DIAGNOSIS — Z87440 Personal history of urinary (tract) infections: Secondary | ICD-10-CM | POA: Diagnosis not present

## 2018-10-07 DIAGNOSIS — E1151 Type 2 diabetes mellitus with diabetic peripheral angiopathy without gangrene: Secondary | ICD-10-CM | POA: Diagnosis not present

## 2018-10-07 DIAGNOSIS — D509 Iron deficiency anemia, unspecified: Secondary | ICD-10-CM | POA: Diagnosis not present

## 2018-10-07 DIAGNOSIS — J9611 Chronic respiratory failure with hypoxia: Secondary | ICD-10-CM | POA: Diagnosis not present

## 2018-10-07 DIAGNOSIS — K219 Gastro-esophageal reflux disease without esophagitis: Secondary | ICD-10-CM | POA: Diagnosis not present

## 2018-10-07 DIAGNOSIS — Z7982 Long term (current) use of aspirin: Secondary | ICD-10-CM | POA: Diagnosis not present

## 2018-10-07 DIAGNOSIS — E1142 Type 2 diabetes mellitus with diabetic polyneuropathy: Secondary | ICD-10-CM | POA: Diagnosis not present

## 2018-10-07 DIAGNOSIS — J449 Chronic obstructive pulmonary disease, unspecified: Secondary | ICD-10-CM | POA: Diagnosis not present

## 2018-10-07 DIAGNOSIS — Z9981 Dependence on supplemental oxygen: Secondary | ICD-10-CM | POA: Diagnosis not present

## 2018-10-07 DIAGNOSIS — E785 Hyperlipidemia, unspecified: Secondary | ICD-10-CM | POA: Diagnosis not present

## 2018-10-07 DIAGNOSIS — I110 Hypertensive heart disease with heart failure: Secondary | ICD-10-CM | POA: Diagnosis not present

## 2018-10-07 DIAGNOSIS — Z794 Long term (current) use of insulin: Secondary | ICD-10-CM | POA: Diagnosis not present

## 2018-10-07 DIAGNOSIS — I4510 Unspecified right bundle-branch block: Secondary | ICD-10-CM | POA: Diagnosis not present

## 2018-10-07 DIAGNOSIS — Z87891 Personal history of nicotine dependence: Secondary | ICD-10-CM | POA: Diagnosis not present

## 2018-10-07 DIAGNOSIS — G4733 Obstructive sleep apnea (adult) (pediatric): Secondary | ICD-10-CM | POA: Diagnosis not present

## 2018-10-07 DIAGNOSIS — M8580 Other specified disorders of bone density and structure, unspecified site: Secondary | ICD-10-CM | POA: Diagnosis not present

## 2018-10-07 DIAGNOSIS — L89612 Pressure ulcer of right heel, stage 2: Secondary | ICD-10-CM | POA: Diagnosis not present

## 2018-10-07 DIAGNOSIS — J9612 Chronic respiratory failure with hypercapnia: Secondary | ICD-10-CM | POA: Diagnosis not present

## 2018-10-07 DIAGNOSIS — E7800 Pure hypercholesterolemia, unspecified: Secondary | ICD-10-CM | POA: Diagnosis not present

## 2018-10-07 DIAGNOSIS — K760 Fatty (change of) liver, not elsewhere classified: Secondary | ICD-10-CM | POA: Diagnosis not present

## 2018-10-07 DIAGNOSIS — M159 Polyosteoarthritis, unspecified: Secondary | ICD-10-CM | POA: Diagnosis not present

## 2018-10-07 DIAGNOSIS — I509 Heart failure, unspecified: Secondary | ICD-10-CM | POA: Diagnosis not present

## 2018-10-07 DIAGNOSIS — M109 Gout, unspecified: Secondary | ICD-10-CM | POA: Diagnosis not present

## 2018-10-10 DIAGNOSIS — E7800 Pure hypercholesterolemia, unspecified: Secondary | ICD-10-CM | POA: Diagnosis not present

## 2018-10-10 DIAGNOSIS — E785 Hyperlipidemia, unspecified: Secondary | ICD-10-CM | POA: Diagnosis not present

## 2018-10-10 DIAGNOSIS — L89612 Pressure ulcer of right heel, stage 2: Secondary | ICD-10-CM | POA: Diagnosis not present

## 2018-10-10 DIAGNOSIS — I110 Hypertensive heart disease with heart failure: Secondary | ICD-10-CM | POA: Diagnosis not present

## 2018-10-10 DIAGNOSIS — E1151 Type 2 diabetes mellitus with diabetic peripheral angiopathy without gangrene: Secondary | ICD-10-CM | POA: Diagnosis not present

## 2018-10-10 DIAGNOSIS — I509 Heart failure, unspecified: Secondary | ICD-10-CM | POA: Diagnosis not present

## 2018-10-10 DIAGNOSIS — J9612 Chronic respiratory failure with hypercapnia: Secondary | ICD-10-CM | POA: Diagnosis not present

## 2018-10-10 DIAGNOSIS — I4510 Unspecified right bundle-branch block: Secondary | ICD-10-CM | POA: Diagnosis not present

## 2018-10-10 DIAGNOSIS — Z7982 Long term (current) use of aspirin: Secondary | ICD-10-CM | POA: Diagnosis not present

## 2018-10-10 DIAGNOSIS — K760 Fatty (change of) liver, not elsewhere classified: Secondary | ICD-10-CM | POA: Diagnosis not present

## 2018-10-10 DIAGNOSIS — J9611 Chronic respiratory failure with hypoxia: Secondary | ICD-10-CM | POA: Diagnosis not present

## 2018-10-10 DIAGNOSIS — K219 Gastro-esophageal reflux disease without esophagitis: Secondary | ICD-10-CM | POA: Diagnosis not present

## 2018-10-10 DIAGNOSIS — J449 Chronic obstructive pulmonary disease, unspecified: Secondary | ICD-10-CM | POA: Diagnosis not present

## 2018-10-10 DIAGNOSIS — Z9981 Dependence on supplemental oxygen: Secondary | ICD-10-CM | POA: Diagnosis not present

## 2018-10-10 DIAGNOSIS — E1142 Type 2 diabetes mellitus with diabetic polyneuropathy: Secondary | ICD-10-CM | POA: Diagnosis not present

## 2018-10-10 DIAGNOSIS — Z87440 Personal history of urinary (tract) infections: Secondary | ICD-10-CM | POA: Diagnosis not present

## 2018-10-10 DIAGNOSIS — G4733 Obstructive sleep apnea (adult) (pediatric): Secondary | ICD-10-CM | POA: Diagnosis not present

## 2018-10-10 DIAGNOSIS — Z794 Long term (current) use of insulin: Secondary | ICD-10-CM | POA: Diagnosis not present

## 2018-10-10 DIAGNOSIS — Z87891 Personal history of nicotine dependence: Secondary | ICD-10-CM | POA: Diagnosis not present

## 2018-10-10 DIAGNOSIS — M109 Gout, unspecified: Secondary | ICD-10-CM | POA: Diagnosis not present

## 2018-10-10 DIAGNOSIS — L89610 Pressure ulcer of right heel, unstageable: Secondary | ICD-10-CM | POA: Diagnosis not present

## 2018-10-10 DIAGNOSIS — M8580 Other specified disorders of bone density and structure, unspecified site: Secondary | ICD-10-CM | POA: Diagnosis not present

## 2018-10-10 DIAGNOSIS — M159 Polyosteoarthritis, unspecified: Secondary | ICD-10-CM | POA: Diagnosis not present

## 2018-10-10 DIAGNOSIS — D509 Iron deficiency anemia, unspecified: Secondary | ICD-10-CM | POA: Diagnosis not present

## 2018-10-12 DIAGNOSIS — L89610 Pressure ulcer of right heel, unstageable: Secondary | ICD-10-CM | POA: Diagnosis not present

## 2018-10-12 DIAGNOSIS — J9612 Chronic respiratory failure with hypercapnia: Secondary | ICD-10-CM | POA: Diagnosis not present

## 2018-10-12 DIAGNOSIS — L89612 Pressure ulcer of right heel, stage 2: Secondary | ICD-10-CM | POA: Diagnosis not present

## 2018-10-12 DIAGNOSIS — I110 Hypertensive heart disease with heart failure: Secondary | ICD-10-CM | POA: Diagnosis not present

## 2018-10-12 DIAGNOSIS — M159 Polyosteoarthritis, unspecified: Secondary | ICD-10-CM | POA: Diagnosis not present

## 2018-10-12 DIAGNOSIS — D649 Anemia, unspecified: Secondary | ICD-10-CM | POA: Diagnosis not present

## 2018-10-12 DIAGNOSIS — E1343 Other specified diabetes mellitus with diabetic autonomic (poly)neuropathy: Secondary | ICD-10-CM | POA: Diagnosis not present

## 2018-10-12 DIAGNOSIS — Z9981 Dependence on supplemental oxygen: Secondary | ICD-10-CM | POA: Diagnosis not present

## 2018-10-12 DIAGNOSIS — E7800 Pure hypercholesterolemia, unspecified: Secondary | ICD-10-CM | POA: Diagnosis not present

## 2018-10-12 DIAGNOSIS — E1142 Type 2 diabetes mellitus with diabetic polyneuropathy: Secondary | ICD-10-CM | POA: Diagnosis not present

## 2018-10-12 DIAGNOSIS — E1151 Type 2 diabetes mellitus with diabetic peripheral angiopathy without gangrene: Secondary | ICD-10-CM | POA: Diagnosis not present

## 2018-10-12 DIAGNOSIS — D509 Iron deficiency anemia, unspecified: Secondary | ICD-10-CM | POA: Diagnosis not present

## 2018-10-12 DIAGNOSIS — I4510 Unspecified right bundle-branch block: Secondary | ICD-10-CM | POA: Diagnosis not present

## 2018-10-12 DIAGNOSIS — Z7982 Long term (current) use of aspirin: Secondary | ICD-10-CM | POA: Diagnosis not present

## 2018-10-12 DIAGNOSIS — I509 Heart failure, unspecified: Secondary | ICD-10-CM | POA: Diagnosis not present

## 2018-10-12 DIAGNOSIS — G4733 Obstructive sleep apnea (adult) (pediatric): Secondary | ICD-10-CM | POA: Diagnosis not present

## 2018-10-12 DIAGNOSIS — E785 Hyperlipidemia, unspecified: Secondary | ICD-10-CM | POA: Diagnosis not present

## 2018-10-12 DIAGNOSIS — K219 Gastro-esophageal reflux disease without esophagitis: Secondary | ICD-10-CM | POA: Diagnosis not present

## 2018-10-12 DIAGNOSIS — Z794 Long term (current) use of insulin: Secondary | ICD-10-CM | POA: Diagnosis not present

## 2018-10-12 DIAGNOSIS — K760 Fatty (change of) liver, not elsewhere classified: Secondary | ICD-10-CM | POA: Diagnosis not present

## 2018-10-12 DIAGNOSIS — Z87891 Personal history of nicotine dependence: Secondary | ICD-10-CM | POA: Diagnosis not present

## 2018-10-12 DIAGNOSIS — M8580 Other specified disorders of bone density and structure, unspecified site: Secondary | ICD-10-CM | POA: Diagnosis not present

## 2018-10-12 DIAGNOSIS — J449 Chronic obstructive pulmonary disease, unspecified: Secondary | ICD-10-CM | POA: Diagnosis not present

## 2018-10-12 DIAGNOSIS — M109 Gout, unspecified: Secondary | ICD-10-CM | POA: Diagnosis not present

## 2018-10-12 DIAGNOSIS — J9611 Chronic respiratory failure with hypoxia: Secondary | ICD-10-CM | POA: Diagnosis not present

## 2018-10-12 DIAGNOSIS — Z87440 Personal history of urinary (tract) infections: Secondary | ICD-10-CM | POA: Diagnosis not present

## 2018-10-14 DIAGNOSIS — M159 Polyosteoarthritis, unspecified: Secondary | ICD-10-CM | POA: Diagnosis not present

## 2018-10-14 DIAGNOSIS — E7800 Pure hypercholesterolemia, unspecified: Secondary | ICD-10-CM | POA: Diagnosis not present

## 2018-10-14 DIAGNOSIS — I4510 Unspecified right bundle-branch block: Secondary | ICD-10-CM | POA: Diagnosis not present

## 2018-10-14 DIAGNOSIS — E1142 Type 2 diabetes mellitus with diabetic polyneuropathy: Secondary | ICD-10-CM | POA: Diagnosis not present

## 2018-10-14 DIAGNOSIS — Z7982 Long term (current) use of aspirin: Secondary | ICD-10-CM | POA: Diagnosis not present

## 2018-10-14 DIAGNOSIS — E785 Hyperlipidemia, unspecified: Secondary | ICD-10-CM | POA: Diagnosis not present

## 2018-10-14 DIAGNOSIS — L89612 Pressure ulcer of right heel, stage 2: Secondary | ICD-10-CM | POA: Diagnosis not present

## 2018-10-14 DIAGNOSIS — J449 Chronic obstructive pulmonary disease, unspecified: Secondary | ICD-10-CM | POA: Diagnosis not present

## 2018-10-14 DIAGNOSIS — M8580 Other specified disorders of bone density and structure, unspecified site: Secondary | ICD-10-CM | POA: Diagnosis not present

## 2018-10-14 DIAGNOSIS — K219 Gastro-esophageal reflux disease without esophagitis: Secondary | ICD-10-CM | POA: Diagnosis not present

## 2018-10-14 DIAGNOSIS — Z87891 Personal history of nicotine dependence: Secondary | ICD-10-CM | POA: Diagnosis not present

## 2018-10-14 DIAGNOSIS — I110 Hypertensive heart disease with heart failure: Secondary | ICD-10-CM | POA: Diagnosis not present

## 2018-10-14 DIAGNOSIS — I509 Heart failure, unspecified: Secondary | ICD-10-CM | POA: Diagnosis not present

## 2018-10-14 DIAGNOSIS — J9611 Chronic respiratory failure with hypoxia: Secondary | ICD-10-CM | POA: Diagnosis not present

## 2018-10-14 DIAGNOSIS — E1151 Type 2 diabetes mellitus with diabetic peripheral angiopathy without gangrene: Secondary | ICD-10-CM | POA: Diagnosis not present

## 2018-10-14 DIAGNOSIS — G4733 Obstructive sleep apnea (adult) (pediatric): Secondary | ICD-10-CM | POA: Diagnosis not present

## 2018-10-14 DIAGNOSIS — Z794 Long term (current) use of insulin: Secondary | ICD-10-CM | POA: Diagnosis not present

## 2018-10-14 DIAGNOSIS — Z87440 Personal history of urinary (tract) infections: Secondary | ICD-10-CM | POA: Diagnosis not present

## 2018-10-14 DIAGNOSIS — M109 Gout, unspecified: Secondary | ICD-10-CM | POA: Diagnosis not present

## 2018-10-14 DIAGNOSIS — J9612 Chronic respiratory failure with hypercapnia: Secondary | ICD-10-CM | POA: Diagnosis not present

## 2018-10-14 DIAGNOSIS — L89610 Pressure ulcer of right heel, unstageable: Secondary | ICD-10-CM | POA: Diagnosis not present

## 2018-10-14 DIAGNOSIS — K760 Fatty (change of) liver, not elsewhere classified: Secondary | ICD-10-CM | POA: Diagnosis not present

## 2018-10-14 DIAGNOSIS — Z9981 Dependence on supplemental oxygen: Secondary | ICD-10-CM | POA: Diagnosis not present

## 2018-10-14 DIAGNOSIS — D509 Iron deficiency anemia, unspecified: Secondary | ICD-10-CM | POA: Diagnosis not present

## 2018-10-17 DIAGNOSIS — J9611 Chronic respiratory failure with hypoxia: Secondary | ICD-10-CM | POA: Diagnosis not present

## 2018-10-17 DIAGNOSIS — E1151 Type 2 diabetes mellitus with diabetic peripheral angiopathy without gangrene: Secondary | ICD-10-CM | POA: Diagnosis not present

## 2018-10-17 DIAGNOSIS — Z794 Long term (current) use of insulin: Secondary | ICD-10-CM | POA: Diagnosis not present

## 2018-10-17 DIAGNOSIS — M109 Gout, unspecified: Secondary | ICD-10-CM | POA: Diagnosis not present

## 2018-10-17 DIAGNOSIS — I4510 Unspecified right bundle-branch block: Secondary | ICD-10-CM | POA: Diagnosis not present

## 2018-10-17 DIAGNOSIS — E119 Type 2 diabetes mellitus without complications: Secondary | ICD-10-CM | POA: Diagnosis not present

## 2018-10-17 DIAGNOSIS — L89612 Pressure ulcer of right heel, stage 2: Secondary | ICD-10-CM | POA: Diagnosis not present

## 2018-10-17 DIAGNOSIS — G4733 Obstructive sleep apnea (adult) (pediatric): Secondary | ICD-10-CM | POA: Diagnosis not present

## 2018-10-17 DIAGNOSIS — I509 Heart failure, unspecified: Secondary | ICD-10-CM | POA: Diagnosis not present

## 2018-10-17 DIAGNOSIS — I110 Hypertensive heart disease with heart failure: Secondary | ICD-10-CM | POA: Diagnosis not present

## 2018-10-17 DIAGNOSIS — Z87440 Personal history of urinary (tract) infections: Secondary | ICD-10-CM | POA: Diagnosis not present

## 2018-10-17 DIAGNOSIS — L89610 Pressure ulcer of right heel, unstageable: Secondary | ICD-10-CM | POA: Diagnosis not present

## 2018-10-17 DIAGNOSIS — E7800 Pure hypercholesterolemia, unspecified: Secondary | ICD-10-CM | POA: Diagnosis not present

## 2018-10-17 DIAGNOSIS — K219 Gastro-esophageal reflux disease without esophagitis: Secondary | ICD-10-CM | POA: Diagnosis not present

## 2018-10-17 DIAGNOSIS — J9612 Chronic respiratory failure with hypercapnia: Secondary | ICD-10-CM | POA: Diagnosis not present

## 2018-10-17 DIAGNOSIS — Z87891 Personal history of nicotine dependence: Secondary | ICD-10-CM | POA: Diagnosis not present

## 2018-10-17 DIAGNOSIS — E785 Hyperlipidemia, unspecified: Secondary | ICD-10-CM | POA: Diagnosis not present

## 2018-10-17 DIAGNOSIS — Z9981 Dependence on supplemental oxygen: Secondary | ICD-10-CM | POA: Diagnosis not present

## 2018-10-17 DIAGNOSIS — E1142 Type 2 diabetes mellitus with diabetic polyneuropathy: Secondary | ICD-10-CM | POA: Diagnosis not present

## 2018-10-17 DIAGNOSIS — M159 Polyosteoarthritis, unspecified: Secondary | ICD-10-CM | POA: Diagnosis not present

## 2018-10-17 DIAGNOSIS — D509 Iron deficiency anemia, unspecified: Secondary | ICD-10-CM | POA: Diagnosis not present

## 2018-10-17 DIAGNOSIS — J449 Chronic obstructive pulmonary disease, unspecified: Secondary | ICD-10-CM | POA: Diagnosis not present

## 2018-10-17 DIAGNOSIS — Z7982 Long term (current) use of aspirin: Secondary | ICD-10-CM | POA: Diagnosis not present

## 2018-10-17 DIAGNOSIS — K760 Fatty (change of) liver, not elsewhere classified: Secondary | ICD-10-CM | POA: Diagnosis not present

## 2018-10-17 DIAGNOSIS — M8580 Other specified disorders of bone density and structure, unspecified site: Secondary | ICD-10-CM | POA: Diagnosis not present

## 2018-10-18 DIAGNOSIS — Z87440 Personal history of urinary (tract) infections: Secondary | ICD-10-CM | POA: Diagnosis not present

## 2018-10-18 DIAGNOSIS — Z794 Long term (current) use of insulin: Secondary | ICD-10-CM | POA: Diagnosis not present

## 2018-10-18 DIAGNOSIS — E1151 Type 2 diabetes mellitus with diabetic peripheral angiopathy without gangrene: Secondary | ICD-10-CM | POA: Diagnosis not present

## 2018-10-18 DIAGNOSIS — Z87891 Personal history of nicotine dependence: Secondary | ICD-10-CM | POA: Diagnosis not present

## 2018-10-18 DIAGNOSIS — J449 Chronic obstructive pulmonary disease, unspecified: Secondary | ICD-10-CM | POA: Diagnosis not present

## 2018-10-18 DIAGNOSIS — J9612 Chronic respiratory failure with hypercapnia: Secondary | ICD-10-CM | POA: Diagnosis not present

## 2018-10-18 DIAGNOSIS — M109 Gout, unspecified: Secondary | ICD-10-CM | POA: Diagnosis not present

## 2018-10-18 DIAGNOSIS — M8580 Other specified disorders of bone density and structure, unspecified site: Secondary | ICD-10-CM | POA: Diagnosis not present

## 2018-10-18 DIAGNOSIS — J9611 Chronic respiratory failure with hypoxia: Secondary | ICD-10-CM | POA: Diagnosis not present

## 2018-10-18 DIAGNOSIS — Z9981 Dependence on supplemental oxygen: Secondary | ICD-10-CM | POA: Diagnosis not present

## 2018-10-18 DIAGNOSIS — Z7982 Long term (current) use of aspirin: Secondary | ICD-10-CM | POA: Diagnosis not present

## 2018-10-18 DIAGNOSIS — K219 Gastro-esophageal reflux disease without esophagitis: Secondary | ICD-10-CM | POA: Diagnosis not present

## 2018-10-18 DIAGNOSIS — G4733 Obstructive sleep apnea (adult) (pediatric): Secondary | ICD-10-CM | POA: Diagnosis not present

## 2018-10-18 DIAGNOSIS — E1142 Type 2 diabetes mellitus with diabetic polyneuropathy: Secondary | ICD-10-CM | POA: Diagnosis not present

## 2018-10-18 DIAGNOSIS — I509 Heart failure, unspecified: Secondary | ICD-10-CM | POA: Diagnosis not present

## 2018-10-18 DIAGNOSIS — K760 Fatty (change of) liver, not elsewhere classified: Secondary | ICD-10-CM | POA: Diagnosis not present

## 2018-10-18 DIAGNOSIS — E785 Hyperlipidemia, unspecified: Secondary | ICD-10-CM | POA: Diagnosis not present

## 2018-10-18 DIAGNOSIS — L89612 Pressure ulcer of right heel, stage 2: Secondary | ICD-10-CM | POA: Diagnosis not present

## 2018-10-18 DIAGNOSIS — L89610 Pressure ulcer of right heel, unstageable: Secondary | ICD-10-CM | POA: Diagnosis not present

## 2018-10-18 DIAGNOSIS — I4510 Unspecified right bundle-branch block: Secondary | ICD-10-CM | POA: Diagnosis not present

## 2018-10-18 DIAGNOSIS — D509 Iron deficiency anemia, unspecified: Secondary | ICD-10-CM | POA: Diagnosis not present

## 2018-10-18 DIAGNOSIS — I110 Hypertensive heart disease with heart failure: Secondary | ICD-10-CM | POA: Diagnosis not present

## 2018-10-18 DIAGNOSIS — M159 Polyosteoarthritis, unspecified: Secondary | ICD-10-CM | POA: Diagnosis not present

## 2018-10-18 DIAGNOSIS — E7800 Pure hypercholesterolemia, unspecified: Secondary | ICD-10-CM | POA: Diagnosis not present

## 2018-10-19 DIAGNOSIS — Z87891 Personal history of nicotine dependence: Secondary | ICD-10-CM | POA: Diagnosis not present

## 2018-10-19 DIAGNOSIS — L89612 Pressure ulcer of right heel, stage 2: Secondary | ICD-10-CM | POA: Diagnosis not present

## 2018-10-19 DIAGNOSIS — J449 Chronic obstructive pulmonary disease, unspecified: Secondary | ICD-10-CM | POA: Diagnosis not present

## 2018-10-19 DIAGNOSIS — M159 Polyosteoarthritis, unspecified: Secondary | ICD-10-CM | POA: Diagnosis not present

## 2018-10-19 DIAGNOSIS — L89610 Pressure ulcer of right heel, unstageable: Secondary | ICD-10-CM | POA: Diagnosis not present

## 2018-10-19 DIAGNOSIS — E785 Hyperlipidemia, unspecified: Secondary | ICD-10-CM | POA: Diagnosis not present

## 2018-10-19 DIAGNOSIS — M8580 Other specified disorders of bone density and structure, unspecified site: Secondary | ICD-10-CM | POA: Diagnosis not present

## 2018-10-19 DIAGNOSIS — E1151 Type 2 diabetes mellitus with diabetic peripheral angiopathy without gangrene: Secondary | ICD-10-CM | POA: Diagnosis not present

## 2018-10-19 DIAGNOSIS — D509 Iron deficiency anemia, unspecified: Secondary | ICD-10-CM | POA: Diagnosis not present

## 2018-10-19 DIAGNOSIS — Z87440 Personal history of urinary (tract) infections: Secondary | ICD-10-CM | POA: Diagnosis not present

## 2018-10-19 DIAGNOSIS — J9611 Chronic respiratory failure with hypoxia: Secondary | ICD-10-CM | POA: Diagnosis not present

## 2018-10-19 DIAGNOSIS — J9612 Chronic respiratory failure with hypercapnia: Secondary | ICD-10-CM | POA: Diagnosis not present

## 2018-10-19 DIAGNOSIS — M109 Gout, unspecified: Secondary | ICD-10-CM | POA: Diagnosis not present

## 2018-10-19 DIAGNOSIS — Z7982 Long term (current) use of aspirin: Secondary | ICD-10-CM | POA: Diagnosis not present

## 2018-10-19 DIAGNOSIS — E1142 Type 2 diabetes mellitus with diabetic polyneuropathy: Secondary | ICD-10-CM | POA: Diagnosis not present

## 2018-10-19 DIAGNOSIS — K219 Gastro-esophageal reflux disease without esophagitis: Secondary | ICD-10-CM | POA: Diagnosis not present

## 2018-10-19 DIAGNOSIS — E7800 Pure hypercholesterolemia, unspecified: Secondary | ICD-10-CM | POA: Diagnosis not present

## 2018-10-19 DIAGNOSIS — I509 Heart failure, unspecified: Secondary | ICD-10-CM | POA: Diagnosis not present

## 2018-10-19 DIAGNOSIS — Z9981 Dependence on supplemental oxygen: Secondary | ICD-10-CM | POA: Diagnosis not present

## 2018-10-19 DIAGNOSIS — K760 Fatty (change of) liver, not elsewhere classified: Secondary | ICD-10-CM | POA: Diagnosis not present

## 2018-10-19 DIAGNOSIS — I4510 Unspecified right bundle-branch block: Secondary | ICD-10-CM | POA: Diagnosis not present

## 2018-10-19 DIAGNOSIS — Z794 Long term (current) use of insulin: Secondary | ICD-10-CM | POA: Diagnosis not present

## 2018-10-19 DIAGNOSIS — I110 Hypertensive heart disease with heart failure: Secondary | ICD-10-CM | POA: Diagnosis not present

## 2018-10-19 DIAGNOSIS — G4733 Obstructive sleep apnea (adult) (pediatric): Secondary | ICD-10-CM | POA: Diagnosis not present

## 2018-10-21 DIAGNOSIS — I509 Heart failure, unspecified: Secondary | ICD-10-CM | POA: Diagnosis not present

## 2018-10-21 DIAGNOSIS — I4510 Unspecified right bundle-branch block: Secondary | ICD-10-CM | POA: Diagnosis not present

## 2018-10-21 DIAGNOSIS — K760 Fatty (change of) liver, not elsewhere classified: Secondary | ICD-10-CM | POA: Diagnosis not present

## 2018-10-21 DIAGNOSIS — Z7982 Long term (current) use of aspirin: Secondary | ICD-10-CM | POA: Diagnosis not present

## 2018-10-21 DIAGNOSIS — K219 Gastro-esophageal reflux disease without esophagitis: Secondary | ICD-10-CM | POA: Diagnosis not present

## 2018-10-21 DIAGNOSIS — L89612 Pressure ulcer of right heel, stage 2: Secondary | ICD-10-CM | POA: Diagnosis not present

## 2018-10-21 DIAGNOSIS — E7800 Pure hypercholesterolemia, unspecified: Secondary | ICD-10-CM | POA: Diagnosis not present

## 2018-10-21 DIAGNOSIS — E1151 Type 2 diabetes mellitus with diabetic peripheral angiopathy without gangrene: Secondary | ICD-10-CM | POA: Diagnosis not present

## 2018-10-21 DIAGNOSIS — M109 Gout, unspecified: Secondary | ICD-10-CM | POA: Diagnosis not present

## 2018-10-21 DIAGNOSIS — J449 Chronic obstructive pulmonary disease, unspecified: Secondary | ICD-10-CM | POA: Diagnosis not present

## 2018-10-21 DIAGNOSIS — L89610 Pressure ulcer of right heel, unstageable: Secondary | ICD-10-CM | POA: Diagnosis not present

## 2018-10-21 DIAGNOSIS — I110 Hypertensive heart disease with heart failure: Secondary | ICD-10-CM | POA: Diagnosis not present

## 2018-10-21 DIAGNOSIS — Z87440 Personal history of urinary (tract) infections: Secondary | ICD-10-CM | POA: Diagnosis not present

## 2018-10-21 DIAGNOSIS — E785 Hyperlipidemia, unspecified: Secondary | ICD-10-CM | POA: Diagnosis not present

## 2018-10-21 DIAGNOSIS — D509 Iron deficiency anemia, unspecified: Secondary | ICD-10-CM | POA: Diagnosis not present

## 2018-10-21 DIAGNOSIS — Z9981 Dependence on supplemental oxygen: Secondary | ICD-10-CM | POA: Diagnosis not present

## 2018-10-21 DIAGNOSIS — E1142 Type 2 diabetes mellitus with diabetic polyneuropathy: Secondary | ICD-10-CM | POA: Diagnosis not present

## 2018-10-21 DIAGNOSIS — J9611 Chronic respiratory failure with hypoxia: Secondary | ICD-10-CM | POA: Diagnosis not present

## 2018-10-21 DIAGNOSIS — G4733 Obstructive sleep apnea (adult) (pediatric): Secondary | ICD-10-CM | POA: Diagnosis not present

## 2018-10-21 DIAGNOSIS — M159 Polyosteoarthritis, unspecified: Secondary | ICD-10-CM | POA: Diagnosis not present

## 2018-10-21 DIAGNOSIS — Z794 Long term (current) use of insulin: Secondary | ICD-10-CM | POA: Diagnosis not present

## 2018-10-21 DIAGNOSIS — Z87891 Personal history of nicotine dependence: Secondary | ICD-10-CM | POA: Diagnosis not present

## 2018-10-21 DIAGNOSIS — M8580 Other specified disorders of bone density and structure, unspecified site: Secondary | ICD-10-CM | POA: Diagnosis not present

## 2018-10-21 DIAGNOSIS — J9612 Chronic respiratory failure with hypercapnia: Secondary | ICD-10-CM | POA: Diagnosis not present

## 2018-10-24 DIAGNOSIS — M159 Polyosteoarthritis, unspecified: Secondary | ICD-10-CM | POA: Diagnosis not present

## 2018-10-24 DIAGNOSIS — G4733 Obstructive sleep apnea (adult) (pediatric): Secondary | ICD-10-CM | POA: Diagnosis not present

## 2018-10-24 DIAGNOSIS — I4510 Unspecified right bundle-branch block: Secondary | ICD-10-CM | POA: Diagnosis not present

## 2018-10-24 DIAGNOSIS — K760 Fatty (change of) liver, not elsewhere classified: Secondary | ICD-10-CM | POA: Diagnosis not present

## 2018-10-24 DIAGNOSIS — M109 Gout, unspecified: Secondary | ICD-10-CM | POA: Diagnosis not present

## 2018-10-24 DIAGNOSIS — Z87440 Personal history of urinary (tract) infections: Secondary | ICD-10-CM | POA: Diagnosis not present

## 2018-10-24 DIAGNOSIS — Z7982 Long term (current) use of aspirin: Secondary | ICD-10-CM | POA: Diagnosis not present

## 2018-10-24 DIAGNOSIS — L89612 Pressure ulcer of right heel, stage 2: Secondary | ICD-10-CM | POA: Diagnosis not present

## 2018-10-24 DIAGNOSIS — E7800 Pure hypercholesterolemia, unspecified: Secondary | ICD-10-CM | POA: Diagnosis not present

## 2018-10-24 DIAGNOSIS — J9611 Chronic respiratory failure with hypoxia: Secondary | ICD-10-CM | POA: Diagnosis not present

## 2018-10-24 DIAGNOSIS — M8580 Other specified disorders of bone density and structure, unspecified site: Secondary | ICD-10-CM | POA: Diagnosis not present

## 2018-10-24 DIAGNOSIS — I509 Heart failure, unspecified: Secondary | ICD-10-CM | POA: Diagnosis not present

## 2018-10-24 DIAGNOSIS — Z87891 Personal history of nicotine dependence: Secondary | ICD-10-CM | POA: Diagnosis not present

## 2018-10-24 DIAGNOSIS — E1142 Type 2 diabetes mellitus with diabetic polyneuropathy: Secondary | ICD-10-CM | POA: Diagnosis not present

## 2018-10-24 DIAGNOSIS — D509 Iron deficiency anemia, unspecified: Secondary | ICD-10-CM | POA: Diagnosis not present

## 2018-10-24 DIAGNOSIS — Z9981 Dependence on supplemental oxygen: Secondary | ICD-10-CM | POA: Diagnosis not present

## 2018-10-24 DIAGNOSIS — L89610 Pressure ulcer of right heel, unstageable: Secondary | ICD-10-CM | POA: Diagnosis not present

## 2018-10-24 DIAGNOSIS — I110 Hypertensive heart disease with heart failure: Secondary | ICD-10-CM | POA: Diagnosis not present

## 2018-10-24 DIAGNOSIS — Z794 Long term (current) use of insulin: Secondary | ICD-10-CM | POA: Diagnosis not present

## 2018-10-24 DIAGNOSIS — E785 Hyperlipidemia, unspecified: Secondary | ICD-10-CM | POA: Diagnosis not present

## 2018-10-24 DIAGNOSIS — K219 Gastro-esophageal reflux disease without esophagitis: Secondary | ICD-10-CM | POA: Diagnosis not present

## 2018-10-24 DIAGNOSIS — E1151 Type 2 diabetes mellitus with diabetic peripheral angiopathy without gangrene: Secondary | ICD-10-CM | POA: Diagnosis not present

## 2018-10-24 DIAGNOSIS — J449 Chronic obstructive pulmonary disease, unspecified: Secondary | ICD-10-CM | POA: Diagnosis not present

## 2018-10-24 DIAGNOSIS — J9612 Chronic respiratory failure with hypercapnia: Secondary | ICD-10-CM | POA: Diagnosis not present

## 2018-10-25 DIAGNOSIS — E1151 Type 2 diabetes mellitus with diabetic peripheral angiopathy without gangrene: Secondary | ICD-10-CM | POA: Diagnosis not present

## 2018-10-25 DIAGNOSIS — L89612 Pressure ulcer of right heel, stage 2: Secondary | ICD-10-CM | POA: Diagnosis not present

## 2018-10-25 DIAGNOSIS — M8580 Other specified disorders of bone density and structure, unspecified site: Secondary | ICD-10-CM | POA: Diagnosis not present

## 2018-10-25 DIAGNOSIS — I509 Heart failure, unspecified: Secondary | ICD-10-CM | POA: Diagnosis not present

## 2018-10-25 DIAGNOSIS — M159 Polyosteoarthritis, unspecified: Secondary | ICD-10-CM | POA: Diagnosis not present

## 2018-10-25 DIAGNOSIS — Z87440 Personal history of urinary (tract) infections: Secondary | ICD-10-CM | POA: Diagnosis not present

## 2018-10-25 DIAGNOSIS — M109 Gout, unspecified: Secondary | ICD-10-CM | POA: Diagnosis not present

## 2018-10-25 DIAGNOSIS — L89610 Pressure ulcer of right heel, unstageable: Secondary | ICD-10-CM | POA: Diagnosis not present

## 2018-10-25 DIAGNOSIS — E785 Hyperlipidemia, unspecified: Secondary | ICD-10-CM | POA: Diagnosis not present

## 2018-10-25 DIAGNOSIS — K760 Fatty (change of) liver, not elsewhere classified: Secondary | ICD-10-CM | POA: Diagnosis not present

## 2018-10-25 DIAGNOSIS — G4733 Obstructive sleep apnea (adult) (pediatric): Secondary | ICD-10-CM | POA: Diagnosis not present

## 2018-10-25 DIAGNOSIS — Z7982 Long term (current) use of aspirin: Secondary | ICD-10-CM | POA: Diagnosis not present

## 2018-10-25 DIAGNOSIS — E1142 Type 2 diabetes mellitus with diabetic polyneuropathy: Secondary | ICD-10-CM | POA: Diagnosis not present

## 2018-10-25 DIAGNOSIS — I110 Hypertensive heart disease with heart failure: Secondary | ICD-10-CM | POA: Diagnosis not present

## 2018-10-25 DIAGNOSIS — Z9981 Dependence on supplemental oxygen: Secondary | ICD-10-CM | POA: Diagnosis not present

## 2018-10-25 DIAGNOSIS — J449 Chronic obstructive pulmonary disease, unspecified: Secondary | ICD-10-CM | POA: Diagnosis not present

## 2018-10-25 DIAGNOSIS — J9611 Chronic respiratory failure with hypoxia: Secondary | ICD-10-CM | POA: Diagnosis not present

## 2018-10-25 DIAGNOSIS — Z794 Long term (current) use of insulin: Secondary | ICD-10-CM | POA: Diagnosis not present

## 2018-10-25 DIAGNOSIS — I4510 Unspecified right bundle-branch block: Secondary | ICD-10-CM | POA: Diagnosis not present

## 2018-10-25 DIAGNOSIS — E7800 Pure hypercholesterolemia, unspecified: Secondary | ICD-10-CM | POA: Diagnosis not present

## 2018-10-25 DIAGNOSIS — D509 Iron deficiency anemia, unspecified: Secondary | ICD-10-CM | POA: Diagnosis not present

## 2018-10-25 DIAGNOSIS — K219 Gastro-esophageal reflux disease without esophagitis: Secondary | ICD-10-CM | POA: Diagnosis not present

## 2018-10-25 DIAGNOSIS — Z87891 Personal history of nicotine dependence: Secondary | ICD-10-CM | POA: Diagnosis not present

## 2018-10-25 DIAGNOSIS — J9612 Chronic respiratory failure with hypercapnia: Secondary | ICD-10-CM | POA: Diagnosis not present

## 2018-10-27 DIAGNOSIS — J9611 Chronic respiratory failure with hypoxia: Secondary | ICD-10-CM | POA: Diagnosis not present

## 2018-10-27 DIAGNOSIS — M159 Polyosteoarthritis, unspecified: Secondary | ICD-10-CM | POA: Diagnosis not present

## 2018-10-27 DIAGNOSIS — R52 Pain, unspecified: Secondary | ICD-10-CM | POA: Diagnosis not present

## 2018-10-27 DIAGNOSIS — R5381 Other malaise: Secondary | ICD-10-CM | POA: Diagnosis not present

## 2018-10-27 DIAGNOSIS — Z794 Long term (current) use of insulin: Secondary | ICD-10-CM | POA: Diagnosis not present

## 2018-10-27 DIAGNOSIS — K760 Fatty (change of) liver, not elsewhere classified: Secondary | ICD-10-CM | POA: Diagnosis not present

## 2018-10-27 DIAGNOSIS — E1151 Type 2 diabetes mellitus with diabetic peripheral angiopathy without gangrene: Secondary | ICD-10-CM | POA: Diagnosis not present

## 2018-10-27 DIAGNOSIS — R531 Weakness: Secondary | ICD-10-CM | POA: Diagnosis not present

## 2018-10-27 DIAGNOSIS — Z87440 Personal history of urinary (tract) infections: Secondary | ICD-10-CM | POA: Diagnosis not present

## 2018-10-27 DIAGNOSIS — I509 Heart failure, unspecified: Secondary | ICD-10-CM | POA: Diagnosis not present

## 2018-10-27 DIAGNOSIS — M8580 Other specified disorders of bone density and structure, unspecified site: Secondary | ICD-10-CM | POA: Diagnosis not present

## 2018-10-27 DIAGNOSIS — G4733 Obstructive sleep apnea (adult) (pediatric): Secondary | ICD-10-CM | POA: Diagnosis not present

## 2018-10-27 DIAGNOSIS — W19XXXA Unspecified fall, initial encounter: Secondary | ICD-10-CM | POA: Diagnosis not present

## 2018-10-27 DIAGNOSIS — J449 Chronic obstructive pulmonary disease, unspecified: Secondary | ICD-10-CM | POA: Diagnosis not present

## 2018-10-27 DIAGNOSIS — D509 Iron deficiency anemia, unspecified: Secondary | ICD-10-CM | POA: Diagnosis not present

## 2018-10-27 DIAGNOSIS — J9612 Chronic respiratory failure with hypercapnia: Secondary | ICD-10-CM | POA: Diagnosis not present

## 2018-10-27 DIAGNOSIS — Z743 Need for continuous supervision: Secondary | ICD-10-CM | POA: Diagnosis not present

## 2018-10-27 DIAGNOSIS — Z9981 Dependence on supplemental oxygen: Secondary | ICD-10-CM | POA: Diagnosis not present

## 2018-10-27 DIAGNOSIS — L89610 Pressure ulcer of right heel, unstageable: Secondary | ICD-10-CM | POA: Diagnosis not present

## 2018-10-27 DIAGNOSIS — I739 Peripheral vascular disease, unspecified: Secondary | ICD-10-CM | POA: Diagnosis not present

## 2018-10-27 DIAGNOSIS — M109 Gout, unspecified: Secondary | ICD-10-CM | POA: Diagnosis not present

## 2018-10-27 DIAGNOSIS — I110 Hypertensive heart disease with heart failure: Secondary | ICD-10-CM | POA: Diagnosis not present

## 2018-10-27 DIAGNOSIS — I4510 Unspecified right bundle-branch block: Secondary | ICD-10-CM | POA: Diagnosis not present

## 2018-10-27 DIAGNOSIS — E1142 Type 2 diabetes mellitus with diabetic polyneuropathy: Secondary | ICD-10-CM | POA: Diagnosis not present

## 2018-10-27 DIAGNOSIS — R600 Localized edema: Secondary | ICD-10-CM | POA: Diagnosis not present

## 2018-10-27 DIAGNOSIS — E785 Hyperlipidemia, unspecified: Secondary | ICD-10-CM | POA: Diagnosis not present

## 2018-10-27 DIAGNOSIS — L89612 Pressure ulcer of right heel, stage 2: Secondary | ICD-10-CM | POA: Diagnosis not present

## 2018-10-27 DIAGNOSIS — E7800 Pure hypercholesterolemia, unspecified: Secondary | ICD-10-CM | POA: Diagnosis not present

## 2018-10-27 DIAGNOSIS — I890 Lymphedema, not elsewhere classified: Secondary | ICD-10-CM | POA: Diagnosis not present

## 2018-10-27 DIAGNOSIS — Z7982 Long term (current) use of aspirin: Secondary | ICD-10-CM | POA: Diagnosis not present

## 2018-10-27 DIAGNOSIS — S91301A Unspecified open wound, right foot, initial encounter: Secondary | ICD-10-CM | POA: Diagnosis not present

## 2018-10-27 DIAGNOSIS — K219 Gastro-esophageal reflux disease without esophagitis: Secondary | ICD-10-CM | POA: Diagnosis not present

## 2018-10-27 DIAGNOSIS — Z87891 Personal history of nicotine dependence: Secondary | ICD-10-CM | POA: Diagnosis not present

## 2018-10-28 DIAGNOSIS — M159 Polyosteoarthritis, unspecified: Secondary | ICD-10-CM | POA: Diagnosis not present

## 2018-10-28 DIAGNOSIS — I509 Heart failure, unspecified: Secondary | ICD-10-CM | POA: Diagnosis not present

## 2018-10-28 DIAGNOSIS — L89612 Pressure ulcer of right heel, stage 2: Secondary | ICD-10-CM | POA: Diagnosis not present

## 2018-10-28 DIAGNOSIS — K219 Gastro-esophageal reflux disease without esophagitis: Secondary | ICD-10-CM | POA: Diagnosis not present

## 2018-10-28 DIAGNOSIS — L89610 Pressure ulcer of right heel, unstageable: Secondary | ICD-10-CM | POA: Diagnosis not present

## 2018-10-28 DIAGNOSIS — I110 Hypertensive heart disease with heart failure: Secondary | ICD-10-CM | POA: Diagnosis not present

## 2018-10-28 DIAGNOSIS — D509 Iron deficiency anemia, unspecified: Secondary | ICD-10-CM | POA: Diagnosis not present

## 2018-10-28 DIAGNOSIS — J9612 Chronic respiratory failure with hypercapnia: Secondary | ICD-10-CM | POA: Diagnosis not present

## 2018-10-28 DIAGNOSIS — G4733 Obstructive sleep apnea (adult) (pediatric): Secondary | ICD-10-CM | POA: Diagnosis not present

## 2018-10-28 DIAGNOSIS — Z794 Long term (current) use of insulin: Secondary | ICD-10-CM | POA: Diagnosis not present

## 2018-10-28 DIAGNOSIS — E785 Hyperlipidemia, unspecified: Secondary | ICD-10-CM | POA: Diagnosis not present

## 2018-10-28 DIAGNOSIS — M8580 Other specified disorders of bone density and structure, unspecified site: Secondary | ICD-10-CM | POA: Diagnosis not present

## 2018-10-28 DIAGNOSIS — Z87440 Personal history of urinary (tract) infections: Secondary | ICD-10-CM | POA: Diagnosis not present

## 2018-10-28 DIAGNOSIS — J449 Chronic obstructive pulmonary disease, unspecified: Secondary | ICD-10-CM | POA: Diagnosis not present

## 2018-10-28 DIAGNOSIS — I4510 Unspecified right bundle-branch block: Secondary | ICD-10-CM | POA: Diagnosis not present

## 2018-10-28 DIAGNOSIS — E7800 Pure hypercholesterolemia, unspecified: Secondary | ICD-10-CM | POA: Diagnosis not present

## 2018-10-28 DIAGNOSIS — J9611 Chronic respiratory failure with hypoxia: Secondary | ICD-10-CM | POA: Diagnosis not present

## 2018-10-28 DIAGNOSIS — E1151 Type 2 diabetes mellitus with diabetic peripheral angiopathy without gangrene: Secondary | ICD-10-CM | POA: Diagnosis not present

## 2018-10-28 DIAGNOSIS — Z87891 Personal history of nicotine dependence: Secondary | ICD-10-CM | POA: Diagnosis not present

## 2018-10-28 DIAGNOSIS — Z9981 Dependence on supplemental oxygen: Secondary | ICD-10-CM | POA: Diagnosis not present

## 2018-10-28 DIAGNOSIS — Z7982 Long term (current) use of aspirin: Secondary | ICD-10-CM | POA: Diagnosis not present

## 2018-10-28 DIAGNOSIS — E1142 Type 2 diabetes mellitus with diabetic polyneuropathy: Secondary | ICD-10-CM | POA: Diagnosis not present

## 2018-10-28 DIAGNOSIS — M109 Gout, unspecified: Secondary | ICD-10-CM | POA: Diagnosis not present

## 2018-10-28 DIAGNOSIS — K760 Fatty (change of) liver, not elsewhere classified: Secondary | ICD-10-CM | POA: Diagnosis not present

## 2018-10-31 DIAGNOSIS — E7800 Pure hypercholesterolemia, unspecified: Secondary | ICD-10-CM | POA: Diagnosis not present

## 2018-10-31 DIAGNOSIS — I110 Hypertensive heart disease with heart failure: Secondary | ICD-10-CM | POA: Diagnosis not present

## 2018-10-31 DIAGNOSIS — M159 Polyosteoarthritis, unspecified: Secondary | ICD-10-CM | POA: Diagnosis not present

## 2018-10-31 DIAGNOSIS — M109 Gout, unspecified: Secondary | ICD-10-CM | POA: Diagnosis not present

## 2018-10-31 DIAGNOSIS — Z7982 Long term (current) use of aspirin: Secondary | ICD-10-CM | POA: Diagnosis not present

## 2018-10-31 DIAGNOSIS — J9611 Chronic respiratory failure with hypoxia: Secondary | ICD-10-CM | POA: Diagnosis not present

## 2018-10-31 DIAGNOSIS — Z794 Long term (current) use of insulin: Secondary | ICD-10-CM | POA: Diagnosis not present

## 2018-10-31 DIAGNOSIS — G4733 Obstructive sleep apnea (adult) (pediatric): Secondary | ICD-10-CM | POA: Diagnosis not present

## 2018-10-31 DIAGNOSIS — J9612 Chronic respiratory failure with hypercapnia: Secondary | ICD-10-CM | POA: Diagnosis not present

## 2018-10-31 DIAGNOSIS — E785 Hyperlipidemia, unspecified: Secondary | ICD-10-CM | POA: Diagnosis not present

## 2018-10-31 DIAGNOSIS — J449 Chronic obstructive pulmonary disease, unspecified: Secondary | ICD-10-CM | POA: Diagnosis not present

## 2018-10-31 DIAGNOSIS — Z87891 Personal history of nicotine dependence: Secondary | ICD-10-CM | POA: Diagnosis not present

## 2018-10-31 DIAGNOSIS — Z9981 Dependence on supplemental oxygen: Secondary | ICD-10-CM | POA: Diagnosis not present

## 2018-10-31 DIAGNOSIS — E1142 Type 2 diabetes mellitus with diabetic polyneuropathy: Secondary | ICD-10-CM | POA: Diagnosis not present

## 2018-10-31 DIAGNOSIS — L89612 Pressure ulcer of right heel, stage 2: Secondary | ICD-10-CM | POA: Diagnosis not present

## 2018-10-31 DIAGNOSIS — L89610 Pressure ulcer of right heel, unstageable: Secondary | ICD-10-CM | POA: Diagnosis not present

## 2018-10-31 DIAGNOSIS — Z87440 Personal history of urinary (tract) infections: Secondary | ICD-10-CM | POA: Diagnosis not present

## 2018-10-31 DIAGNOSIS — E1151 Type 2 diabetes mellitus with diabetic peripheral angiopathy without gangrene: Secondary | ICD-10-CM | POA: Diagnosis not present

## 2018-10-31 DIAGNOSIS — M8580 Other specified disorders of bone density and structure, unspecified site: Secondary | ICD-10-CM | POA: Diagnosis not present

## 2018-10-31 DIAGNOSIS — K760 Fatty (change of) liver, not elsewhere classified: Secondary | ICD-10-CM | POA: Diagnosis not present

## 2018-10-31 DIAGNOSIS — I509 Heart failure, unspecified: Secondary | ICD-10-CM | POA: Diagnosis not present

## 2018-10-31 DIAGNOSIS — I4510 Unspecified right bundle-branch block: Secondary | ICD-10-CM | POA: Diagnosis not present

## 2018-10-31 DIAGNOSIS — D509 Iron deficiency anemia, unspecified: Secondary | ICD-10-CM | POA: Diagnosis not present

## 2018-10-31 DIAGNOSIS — K219 Gastro-esophageal reflux disease without esophagitis: Secondary | ICD-10-CM | POA: Diagnosis not present

## 2018-11-04 DIAGNOSIS — K219 Gastro-esophageal reflux disease without esophagitis: Secondary | ICD-10-CM | POA: Diagnosis not present

## 2018-11-04 DIAGNOSIS — G4733 Obstructive sleep apnea (adult) (pediatric): Secondary | ICD-10-CM | POA: Diagnosis not present

## 2018-11-04 DIAGNOSIS — M1990 Unspecified osteoarthritis, unspecified site: Secondary | ICD-10-CM | POA: Diagnosis not present

## 2018-11-04 DIAGNOSIS — Z794 Long term (current) use of insulin: Secondary | ICD-10-CM | POA: Diagnosis not present

## 2018-11-04 DIAGNOSIS — J449 Chronic obstructive pulmonary disease, unspecified: Secondary | ICD-10-CM

## 2018-11-04 DIAGNOSIS — M109 Gout, unspecified: Secondary | ICD-10-CM | POA: Diagnosis not present

## 2018-11-04 DIAGNOSIS — D122 Benign neoplasm of ascending colon: Secondary | ICD-10-CM | POA: Diagnosis not present

## 2018-11-04 DIAGNOSIS — K5520 Angiodysplasia of colon without hemorrhage: Secondary | ICD-10-CM | POA: Diagnosis not present

## 2018-11-04 DIAGNOSIS — Z9981 Dependence on supplemental oxygen: Secondary | ICD-10-CM | POA: Diagnosis not present

## 2018-11-04 DIAGNOSIS — I10 Essential (primary) hypertension: Secondary | ICD-10-CM | POA: Diagnosis not present

## 2018-11-04 DIAGNOSIS — E1122 Type 2 diabetes mellitus with diabetic chronic kidney disease: Secondary | ICD-10-CM | POA: Diagnosis not present

## 2018-11-04 DIAGNOSIS — Z885 Allergy status to narcotic agent status: Secondary | ICD-10-CM | POA: Diagnosis not present

## 2018-11-04 DIAGNOSIS — E1159 Type 2 diabetes mellitus with other circulatory complications: Secondary | ICD-10-CM

## 2018-11-04 DIAGNOSIS — E872 Acidosis: Secondary | ICD-10-CM | POA: Diagnosis not present

## 2018-11-04 DIAGNOSIS — E7800 Pure hypercholesterolemia, unspecified: Secondary | ICD-10-CM | POA: Diagnosis not present

## 2018-11-04 DIAGNOSIS — J9620 Acute and chronic respiratory failure, unspecified whether with hypoxia or hypercapnia: Secondary | ICD-10-CM | POA: Diagnosis not present

## 2018-11-04 DIAGNOSIS — N179 Acute kidney failure, unspecified: Secondary | ICD-10-CM

## 2018-11-04 DIAGNOSIS — K635 Polyp of colon: Secondary | ICD-10-CM | POA: Diagnosis not present

## 2018-11-04 DIAGNOSIS — D649 Anemia, unspecified: Secondary | ICD-10-CM | POA: Diagnosis not present

## 2018-11-04 DIAGNOSIS — Z452 Encounter for adjustment and management of vascular access device: Secondary | ICD-10-CM | POA: Diagnosis not present

## 2018-11-04 DIAGNOSIS — D500 Iron deficiency anemia secondary to blood loss (chronic): Secondary | ICD-10-CM | POA: Diagnosis not present

## 2018-11-04 DIAGNOSIS — J90 Pleural effusion, not elsewhere classified: Secondary | ICD-10-CM | POA: Diagnosis not present

## 2018-11-04 DIAGNOSIS — K2970 Gastritis, unspecified, without bleeding: Secondary | ICD-10-CM | POA: Diagnosis not present

## 2018-11-04 DIAGNOSIS — Z7982 Long term (current) use of aspirin: Secondary | ICD-10-CM | POA: Diagnosis not present

## 2018-11-04 DIAGNOSIS — J9621 Acute and chronic respiratory failure with hypoxia: Secondary | ICD-10-CM | POA: Diagnosis not present

## 2018-11-04 DIAGNOSIS — E162 Hypoglycemia, unspecified: Secondary | ICD-10-CM | POA: Diagnosis not present

## 2018-11-04 DIAGNOSIS — Z03818 Encounter for observation for suspected exposure to other biological agents ruled out: Secondary | ICD-10-CM | POA: Diagnosis not present

## 2018-11-04 DIAGNOSIS — R069 Unspecified abnormalities of breathing: Secondary | ICD-10-CM | POA: Diagnosis not present

## 2018-11-04 DIAGNOSIS — I5030 Unspecified diastolic (congestive) heart failure: Secondary | ICD-10-CM | POA: Diagnosis not present

## 2018-11-04 DIAGNOSIS — E161 Other hypoglycemia: Secondary | ICD-10-CM | POA: Diagnosis not present

## 2018-11-04 DIAGNOSIS — R0602 Shortness of breath: Secondary | ICD-10-CM | POA: Diagnosis not present

## 2018-11-04 DIAGNOSIS — Z87891 Personal history of nicotine dependence: Secondary | ICD-10-CM | POA: Diagnosis not present

## 2018-11-04 DIAGNOSIS — J9690 Respiratory failure, unspecified, unspecified whether with hypoxia or hypercapnia: Secondary | ICD-10-CM | POA: Diagnosis not present

## 2018-11-04 DIAGNOSIS — Z7401 Bed confinement status: Secondary | ICD-10-CM | POA: Diagnosis not present

## 2018-11-04 DIAGNOSIS — I959 Hypotension, unspecified: Secondary | ICD-10-CM | POA: Diagnosis not present

## 2018-11-04 DIAGNOSIS — E119 Type 2 diabetes mellitus without complications: Secondary | ICD-10-CM | POA: Diagnosis not present

## 2018-11-04 DIAGNOSIS — J9622 Acute and chronic respiratory failure with hypercapnia: Secondary | ICD-10-CM | POA: Diagnosis not present

## 2018-11-04 DIAGNOSIS — I5033 Acute on chronic diastolic (congestive) heart failure: Secondary | ICD-10-CM | POA: Diagnosis not present

## 2018-11-04 DIAGNOSIS — I130 Hypertensive heart and chronic kidney disease with heart failure and stage 1 through stage 4 chronic kidney disease, or unspecified chronic kidney disease: Secondary | ICD-10-CM | POA: Diagnosis not present

## 2018-11-04 DIAGNOSIS — R52 Pain, unspecified: Secondary | ICD-10-CM | POA: Diagnosis not present

## 2018-11-04 DIAGNOSIS — N183 Chronic kidney disease, stage 3 (moderate): Secondary | ICD-10-CM | POA: Diagnosis not present

## 2018-11-04 DIAGNOSIS — E11649 Type 2 diabetes mellitus with hypoglycemia without coma: Secondary | ICD-10-CM | POA: Diagnosis not present

## 2018-11-04 DIAGNOSIS — I272 Pulmonary hypertension, unspecified: Secondary | ICD-10-CM | POA: Diagnosis not present

## 2018-11-04 DIAGNOSIS — D509 Iron deficiency anemia, unspecified: Secondary | ICD-10-CM | POA: Diagnosis not present

## 2018-11-04 DIAGNOSIS — I50810 Right heart failure, unspecified: Secondary | ICD-10-CM | POA: Diagnosis not present

## 2018-11-04 DIAGNOSIS — R0689 Other abnormalities of breathing: Secondary | ICD-10-CM | POA: Diagnosis not present

## 2018-11-04 DIAGNOSIS — Z79899 Other long term (current) drug therapy: Secondary | ICD-10-CM | POA: Diagnosis not present

## 2018-11-04 DIAGNOSIS — Z9119 Patient's noncompliance with other medical treatment and regimen: Secondary | ICD-10-CM | POA: Diagnosis not present

## 2018-11-05 DIAGNOSIS — N179 Acute kidney failure, unspecified: Secondary | ICD-10-CM | POA: Diagnosis not present

## 2018-11-05 DIAGNOSIS — J449 Chronic obstructive pulmonary disease, unspecified: Secondary | ICD-10-CM | POA: Diagnosis not present

## 2018-11-05 DIAGNOSIS — J9620 Acute and chronic respiratory failure, unspecified whether with hypoxia or hypercapnia: Secondary | ICD-10-CM | POA: Diagnosis not present

## 2018-11-05 DIAGNOSIS — D649 Anemia, unspecified: Secondary | ICD-10-CM | POA: Diagnosis not present

## 2018-11-06 DIAGNOSIS — N179 Acute kidney failure, unspecified: Secondary | ICD-10-CM | POA: Diagnosis not present

## 2018-11-06 DIAGNOSIS — D649 Anemia, unspecified: Secondary | ICD-10-CM | POA: Diagnosis not present

## 2018-11-06 DIAGNOSIS — J449 Chronic obstructive pulmonary disease, unspecified: Secondary | ICD-10-CM | POA: Diagnosis not present

## 2018-11-06 DIAGNOSIS — J9620 Acute and chronic respiratory failure, unspecified whether with hypoxia or hypercapnia: Secondary | ICD-10-CM | POA: Diagnosis not present

## 2018-11-07 DIAGNOSIS — J9690 Respiratory failure, unspecified, unspecified whether with hypoxia or hypercapnia: Secondary | ICD-10-CM | POA: Diagnosis not present

## 2018-11-07 DIAGNOSIS — J9620 Acute and chronic respiratory failure, unspecified whether with hypoxia or hypercapnia: Secondary | ICD-10-CM | POA: Diagnosis not present

## 2018-11-07 DIAGNOSIS — D649 Anemia, unspecified: Secondary | ICD-10-CM | POA: Diagnosis not present

## 2018-11-07 DIAGNOSIS — J449 Chronic obstructive pulmonary disease, unspecified: Secondary | ICD-10-CM | POA: Diagnosis not present

## 2018-11-07 DIAGNOSIS — N179 Acute kidney failure, unspecified: Secondary | ICD-10-CM | POA: Diagnosis not present

## 2018-11-08 DIAGNOSIS — J449 Chronic obstructive pulmonary disease, unspecified: Secondary | ICD-10-CM | POA: Diagnosis not present

## 2018-11-08 DIAGNOSIS — N179 Acute kidney failure, unspecified: Secondary | ICD-10-CM | POA: Diagnosis not present

## 2018-11-08 DIAGNOSIS — I5033 Acute on chronic diastolic (congestive) heart failure: Secondary | ICD-10-CM

## 2018-11-08 DIAGNOSIS — Z452 Encounter for adjustment and management of vascular access device: Secondary | ICD-10-CM | POA: Diagnosis not present

## 2018-11-08 DIAGNOSIS — J9620 Acute and chronic respiratory failure, unspecified whether with hypoxia or hypercapnia: Secondary | ICD-10-CM | POA: Diagnosis not present

## 2018-11-08 DIAGNOSIS — D649 Anemia, unspecified: Secondary | ICD-10-CM | POA: Diagnosis not present

## 2018-11-09 DIAGNOSIS — J449 Chronic obstructive pulmonary disease, unspecified: Secondary | ICD-10-CM | POA: Diagnosis not present

## 2018-11-09 DIAGNOSIS — D649 Anemia, unspecified: Secondary | ICD-10-CM | POA: Diagnosis not present

## 2018-11-09 DIAGNOSIS — N179 Acute kidney failure, unspecified: Secondary | ICD-10-CM | POA: Diagnosis not present

## 2018-11-09 DIAGNOSIS — J9620 Acute and chronic respiratory failure, unspecified whether with hypoxia or hypercapnia: Secondary | ICD-10-CM | POA: Diagnosis not present

## 2018-11-10 DIAGNOSIS — J9620 Acute and chronic respiratory failure, unspecified whether with hypoxia or hypercapnia: Secondary | ICD-10-CM | POA: Diagnosis not present

## 2018-11-10 DIAGNOSIS — D649 Anemia, unspecified: Secondary | ICD-10-CM | POA: Diagnosis not present

## 2018-11-10 DIAGNOSIS — N179 Acute kidney failure, unspecified: Secondary | ICD-10-CM | POA: Diagnosis not present

## 2018-11-10 DIAGNOSIS — I10 Essential (primary) hypertension: Secondary | ICD-10-CM

## 2018-11-10 DIAGNOSIS — I5030 Unspecified diastolic (congestive) heart failure: Secondary | ICD-10-CM

## 2018-11-10 DIAGNOSIS — R0689 Other abnormalities of breathing: Secondary | ICD-10-CM

## 2018-11-10 DIAGNOSIS — J449 Chronic obstructive pulmonary disease, unspecified: Secondary | ICD-10-CM | POA: Diagnosis not present

## 2018-11-11 DIAGNOSIS — D649 Anemia, unspecified: Secondary | ICD-10-CM | POA: Diagnosis not present

## 2018-11-11 DIAGNOSIS — J9620 Acute and chronic respiratory failure, unspecified whether with hypoxia or hypercapnia: Secondary | ICD-10-CM | POA: Diagnosis not present

## 2018-11-11 DIAGNOSIS — R0689 Other abnormalities of breathing: Secondary | ICD-10-CM | POA: Diagnosis not present

## 2018-11-11 DIAGNOSIS — I10 Essential (primary) hypertension: Secondary | ICD-10-CM | POA: Diagnosis not present

## 2018-11-11 DIAGNOSIS — I5030 Unspecified diastolic (congestive) heart failure: Secondary | ICD-10-CM | POA: Diagnosis not present

## 2018-11-11 DIAGNOSIS — J449 Chronic obstructive pulmonary disease, unspecified: Secondary | ICD-10-CM | POA: Diagnosis not present

## 2018-11-11 DIAGNOSIS — N179 Acute kidney failure, unspecified: Secondary | ICD-10-CM | POA: Diagnosis not present

## 2018-11-12 DIAGNOSIS — D649 Anemia, unspecified: Secondary | ICD-10-CM

## 2018-11-12 DIAGNOSIS — J449 Chronic obstructive pulmonary disease, unspecified: Secondary | ICD-10-CM | POA: Diagnosis not present

## 2018-11-12 DIAGNOSIS — N179 Acute kidney failure, unspecified: Secondary | ICD-10-CM | POA: Diagnosis not present

## 2018-11-12 DIAGNOSIS — I5030 Unspecified diastolic (congestive) heart failure: Secondary | ICD-10-CM

## 2018-11-12 DIAGNOSIS — J90 Pleural effusion, not elsewhere classified: Secondary | ICD-10-CM | POA: Diagnosis not present

## 2018-11-12 DIAGNOSIS — J9620 Acute and chronic respiratory failure, unspecified whether with hypoxia or hypercapnia: Secondary | ICD-10-CM

## 2018-11-13 DIAGNOSIS — J9620 Acute and chronic respiratory failure, unspecified whether with hypoxia or hypercapnia: Secondary | ICD-10-CM | POA: Diagnosis not present

## 2018-11-13 DIAGNOSIS — N179 Acute kidney failure, unspecified: Secondary | ICD-10-CM | POA: Diagnosis not present

## 2018-11-13 DIAGNOSIS — J449 Chronic obstructive pulmonary disease, unspecified: Secondary | ICD-10-CM | POA: Diagnosis not present

## 2018-11-13 DIAGNOSIS — D649 Anemia, unspecified: Secondary | ICD-10-CM | POA: Diagnosis not present

## 2018-11-14 DIAGNOSIS — N179 Acute kidney failure, unspecified: Secondary | ICD-10-CM | POA: Diagnosis not present

## 2018-11-14 DIAGNOSIS — R0602 Shortness of breath: Secondary | ICD-10-CM | POA: Diagnosis not present

## 2018-11-14 DIAGNOSIS — D649 Anemia, unspecified: Secondary | ICD-10-CM | POA: Diagnosis not present

## 2018-11-14 DIAGNOSIS — J9620 Acute and chronic respiratory failure, unspecified whether with hypoxia or hypercapnia: Secondary | ICD-10-CM | POA: Diagnosis not present

## 2018-11-14 DIAGNOSIS — J449 Chronic obstructive pulmonary disease, unspecified: Secondary | ICD-10-CM | POA: Diagnosis not present

## 2018-11-14 DIAGNOSIS — J90 Pleural effusion, not elsewhere classified: Secondary | ICD-10-CM | POA: Diagnosis not present

## 2018-11-15 DIAGNOSIS — J449 Chronic obstructive pulmonary disease, unspecified: Secondary | ICD-10-CM | POA: Diagnosis not present

## 2018-11-15 DIAGNOSIS — D649 Anemia, unspecified: Secondary | ICD-10-CM | POA: Diagnosis not present

## 2018-11-15 DIAGNOSIS — J9620 Acute and chronic respiratory failure, unspecified whether with hypoxia or hypercapnia: Secondary | ICD-10-CM | POA: Diagnosis not present

## 2018-11-15 DIAGNOSIS — N179 Acute kidney failure, unspecified: Secondary | ICD-10-CM | POA: Diagnosis not present

## 2018-11-16 DIAGNOSIS — K5520 Angiodysplasia of colon without hemorrhage: Secondary | ICD-10-CM | POA: Diagnosis not present

## 2018-11-16 DIAGNOSIS — J9620 Acute and chronic respiratory failure, unspecified whether with hypoxia or hypercapnia: Secondary | ICD-10-CM | POA: Diagnosis not present

## 2018-11-16 DIAGNOSIS — N179 Acute kidney failure, unspecified: Secondary | ICD-10-CM | POA: Diagnosis not present

## 2018-11-16 DIAGNOSIS — J449 Chronic obstructive pulmonary disease, unspecified: Secondary | ICD-10-CM | POA: Diagnosis not present

## 2018-11-16 DIAGNOSIS — K635 Polyp of colon: Secondary | ICD-10-CM | POA: Diagnosis not present

## 2018-11-16 DIAGNOSIS — D649 Anemia, unspecified: Secondary | ICD-10-CM | POA: Diagnosis not present

## 2018-11-17 DIAGNOSIS — K5520 Angiodysplasia of colon without hemorrhage: Secondary | ICD-10-CM | POA: Diagnosis not present

## 2018-11-17 DIAGNOSIS — J449 Chronic obstructive pulmonary disease, unspecified: Secondary | ICD-10-CM | POA: Diagnosis not present

## 2018-11-17 DIAGNOSIS — J9620 Acute and chronic respiratory failure, unspecified whether with hypoxia or hypercapnia: Secondary | ICD-10-CM | POA: Diagnosis not present

## 2018-11-17 DIAGNOSIS — D649 Anemia, unspecified: Secondary | ICD-10-CM | POA: Diagnosis not present

## 2018-11-17 DIAGNOSIS — N179 Acute kidney failure, unspecified: Secondary | ICD-10-CM | POA: Diagnosis not present

## 2018-11-17 DIAGNOSIS — K635 Polyp of colon: Secondary | ICD-10-CM | POA: Diagnosis not present

## 2018-11-18 DIAGNOSIS — J9620 Acute and chronic respiratory failure, unspecified whether with hypoxia or hypercapnia: Secondary | ICD-10-CM | POA: Diagnosis not present

## 2018-11-18 DIAGNOSIS — K635 Polyp of colon: Secondary | ICD-10-CM | POA: Diagnosis not present

## 2018-11-18 DIAGNOSIS — K5520 Angiodysplasia of colon without hemorrhage: Secondary | ICD-10-CM | POA: Diagnosis not present

## 2018-11-18 DIAGNOSIS — D649 Anemia, unspecified: Secondary | ICD-10-CM | POA: Diagnosis not present

## 2018-11-18 DIAGNOSIS — J449 Chronic obstructive pulmonary disease, unspecified: Secondary | ICD-10-CM | POA: Diagnosis not present

## 2018-11-18 DIAGNOSIS — N179 Acute kidney failure, unspecified: Secondary | ICD-10-CM | POA: Diagnosis not present

## 2018-11-19 DIAGNOSIS — N179 Acute kidney failure, unspecified: Secondary | ICD-10-CM | POA: Diagnosis not present

## 2018-11-19 DIAGNOSIS — D509 Iron deficiency anemia, unspecified: Secondary | ICD-10-CM

## 2018-11-19 DIAGNOSIS — J9620 Acute and chronic respiratory failure, unspecified whether with hypoxia or hypercapnia: Secondary | ICD-10-CM | POA: Diagnosis not present

## 2018-11-19 DIAGNOSIS — K635 Polyp of colon: Secondary | ICD-10-CM | POA: Diagnosis not present

## 2018-11-19 DIAGNOSIS — K5520 Angiodysplasia of colon without hemorrhage: Secondary | ICD-10-CM | POA: Diagnosis not present

## 2018-11-19 DIAGNOSIS — D649 Anemia, unspecified: Secondary | ICD-10-CM | POA: Diagnosis not present

## 2018-11-19 DIAGNOSIS — J449 Chronic obstructive pulmonary disease, unspecified: Secondary | ICD-10-CM | POA: Diagnosis not present

## 2018-11-20 DIAGNOSIS — J9620 Acute and chronic respiratory failure, unspecified whether with hypoxia or hypercapnia: Secondary | ICD-10-CM | POA: Diagnosis not present

## 2018-11-20 DIAGNOSIS — K635 Polyp of colon: Secondary | ICD-10-CM | POA: Diagnosis not present

## 2018-11-20 DIAGNOSIS — N179 Acute kidney failure, unspecified: Secondary | ICD-10-CM | POA: Diagnosis not present

## 2018-11-20 DIAGNOSIS — J449 Chronic obstructive pulmonary disease, unspecified: Secondary | ICD-10-CM | POA: Diagnosis not present

## 2018-11-20 DIAGNOSIS — D649 Anemia, unspecified: Secondary | ICD-10-CM | POA: Diagnosis not present

## 2018-11-20 DIAGNOSIS — K5520 Angiodysplasia of colon without hemorrhage: Secondary | ICD-10-CM | POA: Diagnosis not present

## 2018-11-21 DIAGNOSIS — J9620 Acute and chronic respiratory failure, unspecified whether with hypoxia or hypercapnia: Secondary | ICD-10-CM | POA: Diagnosis not present

## 2018-11-21 DIAGNOSIS — N179 Acute kidney failure, unspecified: Secondary | ICD-10-CM | POA: Diagnosis not present

## 2018-11-21 DIAGNOSIS — K5520 Angiodysplasia of colon without hemorrhage: Secondary | ICD-10-CM | POA: Diagnosis not present

## 2018-11-21 DIAGNOSIS — J449 Chronic obstructive pulmonary disease, unspecified: Secondary | ICD-10-CM | POA: Diagnosis not present

## 2018-11-21 DIAGNOSIS — D649 Anemia, unspecified: Secondary | ICD-10-CM | POA: Diagnosis not present

## 2018-11-21 DIAGNOSIS — K635 Polyp of colon: Secondary | ICD-10-CM | POA: Diagnosis not present

## 2018-11-22 DIAGNOSIS — K2970 Gastritis, unspecified, without bleeding: Secondary | ICD-10-CM | POA: Diagnosis not present

## 2018-11-22 DIAGNOSIS — E119 Type 2 diabetes mellitus without complications: Secondary | ICD-10-CM | POA: Diagnosis not present

## 2018-11-22 DIAGNOSIS — D122 Benign neoplasm of ascending colon: Secondary | ICD-10-CM | POA: Diagnosis not present

## 2018-11-22 DIAGNOSIS — J9620 Acute and chronic respiratory failure, unspecified whether with hypoxia or hypercapnia: Secondary | ICD-10-CM | POA: Diagnosis not present

## 2018-11-22 DIAGNOSIS — J449 Chronic obstructive pulmonary disease, unspecified: Secondary | ICD-10-CM | POA: Diagnosis not present

## 2018-11-22 DIAGNOSIS — K635 Polyp of colon: Secondary | ICD-10-CM | POA: Diagnosis not present

## 2018-11-22 DIAGNOSIS — K5520 Angiodysplasia of colon without hemorrhage: Secondary | ICD-10-CM | POA: Diagnosis not present

## 2018-11-22 DIAGNOSIS — N179 Acute kidney failure, unspecified: Secondary | ICD-10-CM | POA: Diagnosis not present

## 2018-11-22 DIAGNOSIS — D649 Anemia, unspecified: Secondary | ICD-10-CM | POA: Diagnosis not present

## 2018-11-23 DIAGNOSIS — K5520 Angiodysplasia of colon without hemorrhage: Secondary | ICD-10-CM | POA: Diagnosis not present

## 2018-11-23 DIAGNOSIS — J9620 Acute and chronic respiratory failure, unspecified whether with hypoxia or hypercapnia: Secondary | ICD-10-CM | POA: Diagnosis not present

## 2018-11-23 DIAGNOSIS — N179 Acute kidney failure, unspecified: Secondary | ICD-10-CM | POA: Diagnosis not present

## 2018-11-23 DIAGNOSIS — D649 Anemia, unspecified: Secondary | ICD-10-CM | POA: Diagnosis not present

## 2018-11-23 DIAGNOSIS — K635 Polyp of colon: Secondary | ICD-10-CM | POA: Diagnosis not present

## 2018-11-23 DIAGNOSIS — J449 Chronic obstructive pulmonary disease, unspecified: Secondary | ICD-10-CM | POA: Diagnosis not present

## 2018-11-24 DIAGNOSIS — J9620 Acute and chronic respiratory failure, unspecified whether with hypoxia or hypercapnia: Secondary | ICD-10-CM | POA: Diagnosis not present

## 2018-11-24 DIAGNOSIS — D649 Anemia, unspecified: Secondary | ICD-10-CM | POA: Diagnosis not present

## 2018-11-24 DIAGNOSIS — J449 Chronic obstructive pulmonary disease, unspecified: Secondary | ICD-10-CM | POA: Diagnosis not present

## 2018-11-24 DIAGNOSIS — N179 Acute kidney failure, unspecified: Secondary | ICD-10-CM | POA: Diagnosis not present

## 2018-11-25 DIAGNOSIS — J9621 Acute and chronic respiratory failure with hypoxia: Secondary | ICD-10-CM | POA: Diagnosis not present

## 2018-11-25 DIAGNOSIS — J9620 Acute and chronic respiratory failure, unspecified whether with hypoxia or hypercapnia: Secondary | ICD-10-CM | POA: Diagnosis not present

## 2018-11-25 DIAGNOSIS — D649 Anemia, unspecified: Secondary | ICD-10-CM | POA: Diagnosis not present

## 2018-11-25 DIAGNOSIS — N179 Acute kidney failure, unspecified: Secondary | ICD-10-CM | POA: Diagnosis not present

## 2018-11-25 DIAGNOSIS — J449 Chronic obstructive pulmonary disease, unspecified: Secondary | ICD-10-CM | POA: Diagnosis not present

## 2018-11-26 DIAGNOSIS — D649 Anemia, unspecified: Secondary | ICD-10-CM | POA: Diagnosis not present

## 2018-11-26 DIAGNOSIS — Z7401 Bed confinement status: Secondary | ICD-10-CM | POA: Diagnosis not present

## 2018-11-26 DIAGNOSIS — N179 Acute kidney failure, unspecified: Secondary | ICD-10-CM | POA: Diagnosis not present

## 2018-11-26 DIAGNOSIS — J449 Chronic obstructive pulmonary disease, unspecified: Secondary | ICD-10-CM | POA: Diagnosis not present

## 2018-11-26 DIAGNOSIS — R52 Pain, unspecified: Secondary | ICD-10-CM | POA: Diagnosis not present

## 2018-11-26 DIAGNOSIS — J9620 Acute and chronic respiratory failure, unspecified whether with hypoxia or hypercapnia: Secondary | ICD-10-CM | POA: Diagnosis not present

## 2018-11-26 DIAGNOSIS — I959 Hypotension, unspecified: Secondary | ICD-10-CM | POA: Diagnosis not present

## 2018-11-27 DIAGNOSIS — R0602 Shortness of breath: Secondary | ICD-10-CM | POA: Diagnosis not present

## 2018-11-27 DIAGNOSIS — I110 Hypertensive heart disease with heart failure: Secondary | ICD-10-CM | POA: Diagnosis not present

## 2018-11-27 DIAGNOSIS — J9621 Acute and chronic respiratory failure with hypoxia: Secondary | ICD-10-CM | POA: Diagnosis not present

## 2018-11-27 DIAGNOSIS — I130 Hypertensive heart and chronic kidney disease with heart failure and stage 1 through stage 4 chronic kidney disease, or unspecified chronic kidney disease: Secondary | ICD-10-CM | POA: Diagnosis not present

## 2018-11-27 DIAGNOSIS — J811 Chronic pulmonary edema: Secondary | ICD-10-CM | POA: Diagnosis not present

## 2018-11-27 DIAGNOSIS — R069 Unspecified abnormalities of breathing: Secondary | ICD-10-CM | POA: Diagnosis not present

## 2018-11-27 DIAGNOSIS — I5033 Acute on chronic diastolic (congestive) heart failure: Secondary | ICD-10-CM | POA: Diagnosis not present

## 2018-11-27 DIAGNOSIS — N179 Acute kidney failure, unspecified: Secondary | ICD-10-CM | POA: Diagnosis not present

## 2018-11-28 DIAGNOSIS — I5033 Acute on chronic diastolic (congestive) heart failure: Secondary | ICD-10-CM | POA: Diagnosis not present

## 2018-11-28 DIAGNOSIS — N179 Acute kidney failure, unspecified: Secondary | ICD-10-CM | POA: Diagnosis not present

## 2018-11-28 DIAGNOSIS — R0602 Shortness of breath: Secondary | ICD-10-CM | POA: Diagnosis not present

## 2018-11-28 DIAGNOSIS — I130 Hypertensive heart and chronic kidney disease with heart failure and stage 1 through stage 4 chronic kidney disease, or unspecified chronic kidney disease: Secondary | ICD-10-CM | POA: Diagnosis not present

## 2018-11-28 DIAGNOSIS — J9621 Acute and chronic respiratory failure with hypoxia: Secondary | ICD-10-CM | POA: Diagnosis not present

## 2018-11-29 DIAGNOSIS — Z8719 Personal history of other diseases of the digestive system: Secondary | ICD-10-CM | POA: Diagnosis not present

## 2018-11-29 DIAGNOSIS — Z885 Allergy status to narcotic agent status: Secondary | ICD-10-CM | POA: Diagnosis not present

## 2018-11-29 DIAGNOSIS — M109 Gout, unspecified: Secondary | ICD-10-CM | POA: Diagnosis not present

## 2018-11-29 DIAGNOSIS — E119 Type 2 diabetes mellitus without complications: Secondary | ICD-10-CM | POA: Diagnosis not present

## 2018-11-29 DIAGNOSIS — Z7401 Bed confinement status: Secondary | ICD-10-CM | POA: Diagnosis not present

## 2018-11-29 DIAGNOSIS — R5381 Other malaise: Secondary | ICD-10-CM | POA: Diagnosis not present

## 2018-11-29 DIAGNOSIS — K219 Gastro-esophageal reflux disease without esophagitis: Secondary | ICD-10-CM | POA: Diagnosis not present

## 2018-11-29 DIAGNOSIS — R531 Weakness: Secondary | ICD-10-CM | POA: Diagnosis not present

## 2018-11-29 DIAGNOSIS — Z9981 Dependence on supplemental oxygen: Secondary | ICD-10-CM | POA: Diagnosis not present

## 2018-11-29 DIAGNOSIS — I272 Pulmonary hypertension, unspecified: Secondary | ICD-10-CM | POA: Diagnosis not present

## 2018-11-29 DIAGNOSIS — J9622 Acute and chronic respiratory failure with hypercapnia: Secondary | ICD-10-CM | POA: Diagnosis not present

## 2018-11-29 DIAGNOSIS — M1990 Unspecified osteoarthritis, unspecified site: Secondary | ICD-10-CM | POA: Diagnosis not present

## 2018-11-29 DIAGNOSIS — I130 Hypertensive heart and chronic kidney disease with heart failure and stage 1 through stage 4 chronic kidney disease, or unspecified chronic kidney disease: Secondary | ICD-10-CM | POA: Diagnosis not present

## 2018-11-29 DIAGNOSIS — I739 Peripheral vascular disease, unspecified: Secondary | ICD-10-CM | POA: Diagnosis not present

## 2018-11-29 DIAGNOSIS — N179 Acute kidney failure, unspecified: Secondary | ICD-10-CM | POA: Diagnosis not present

## 2018-11-29 DIAGNOSIS — D638 Anemia in other chronic diseases classified elsewhere: Secondary | ICD-10-CM | POA: Diagnosis not present

## 2018-11-29 DIAGNOSIS — J9621 Acute and chronic respiratory failure with hypoxia: Secondary | ICD-10-CM | POA: Diagnosis not present

## 2018-11-29 DIAGNOSIS — R0602 Shortness of breath: Secondary | ICD-10-CM | POA: Diagnosis not present

## 2018-11-29 DIAGNOSIS — I959 Hypotension, unspecified: Secondary | ICD-10-CM | POA: Diagnosis not present

## 2018-11-29 DIAGNOSIS — I509 Heart failure, unspecified: Secondary | ICD-10-CM | POA: Diagnosis not present

## 2018-11-29 DIAGNOSIS — G9341 Metabolic encephalopathy: Secondary | ICD-10-CM | POA: Diagnosis not present

## 2018-11-29 DIAGNOSIS — Z87891 Personal history of nicotine dependence: Secondary | ICD-10-CM | POA: Diagnosis not present

## 2018-11-29 DIAGNOSIS — I5033 Acute on chronic diastolic (congestive) heart failure: Secondary | ICD-10-CM | POA: Diagnosis not present

## 2018-11-29 DIAGNOSIS — Z794 Long term (current) use of insulin: Secondary | ICD-10-CM | POA: Diagnosis not present

## 2018-11-29 DIAGNOSIS — J449 Chronic obstructive pulmonary disease, unspecified: Secondary | ICD-10-CM | POA: Diagnosis not present

## 2018-11-29 DIAGNOSIS — Z79899 Other long term (current) drug therapy: Secondary | ICD-10-CM | POA: Diagnosis not present

## 2018-11-29 DIAGNOSIS — I110 Hypertensive heart disease with heart failure: Secondary | ICD-10-CM | POA: Diagnosis not present

## 2018-11-29 DIAGNOSIS — I5032 Chronic diastolic (congestive) heart failure: Secondary | ICD-10-CM | POA: Diagnosis not present

## 2018-11-29 DIAGNOSIS — Z7982 Long term (current) use of aspirin: Secondary | ICD-10-CM | POA: Diagnosis not present

## 2018-11-29 DIAGNOSIS — M25871 Other specified joint disorders, right ankle and foot: Secondary | ICD-10-CM | POA: Diagnosis not present

## 2018-11-30 DIAGNOSIS — J9621 Acute and chronic respiratory failure with hypoxia: Secondary | ICD-10-CM | POA: Diagnosis not present

## 2018-11-30 DIAGNOSIS — R0602 Shortness of breath: Secondary | ICD-10-CM | POA: Diagnosis not present

## 2018-11-30 DIAGNOSIS — I130 Hypertensive heart and chronic kidney disease with heart failure and stage 1 through stage 4 chronic kidney disease, or unspecified chronic kidney disease: Secondary | ICD-10-CM | POA: Diagnosis not present

## 2018-11-30 DIAGNOSIS — N179 Acute kidney failure, unspecified: Secondary | ICD-10-CM | POA: Diagnosis not present

## 2018-11-30 DIAGNOSIS — I5033 Acute on chronic diastolic (congestive) heart failure: Secondary | ICD-10-CM | POA: Diagnosis not present

## 2018-12-01 DIAGNOSIS — R0602 Shortness of breath: Secondary | ICD-10-CM | POA: Diagnosis not present

## 2018-12-01 DIAGNOSIS — J9621 Acute and chronic respiratory failure with hypoxia: Secondary | ICD-10-CM | POA: Diagnosis not present

## 2018-12-01 DIAGNOSIS — I5033 Acute on chronic diastolic (congestive) heart failure: Secondary | ICD-10-CM | POA: Diagnosis not present

## 2018-12-01 DIAGNOSIS — N179 Acute kidney failure, unspecified: Secondary | ICD-10-CM | POA: Diagnosis not present

## 2018-12-01 DIAGNOSIS — I130 Hypertensive heart and chronic kidney disease with heart failure and stage 1 through stage 4 chronic kidney disease, or unspecified chronic kidney disease: Secondary | ICD-10-CM | POA: Diagnosis not present

## 2018-12-02 DIAGNOSIS — Z7401 Bed confinement status: Secondary | ICD-10-CM | POA: Diagnosis not present

## 2018-12-02 DIAGNOSIS — I130 Hypertensive heart and chronic kidney disease with heart failure and stage 1 through stage 4 chronic kidney disease, or unspecified chronic kidney disease: Secondary | ICD-10-CM | POA: Diagnosis not present

## 2018-12-02 DIAGNOSIS — N179 Acute kidney failure, unspecified: Secondary | ICD-10-CM | POA: Diagnosis not present

## 2018-12-02 DIAGNOSIS — J9621 Acute and chronic respiratory failure with hypoxia: Secondary | ICD-10-CM | POA: Diagnosis not present

## 2018-12-02 DIAGNOSIS — M25871 Other specified joint disorders, right ankle and foot: Secondary | ICD-10-CM | POA: Diagnosis not present

## 2018-12-02 DIAGNOSIS — R0602 Shortness of breath: Secondary | ICD-10-CM | POA: Diagnosis not present

## 2018-12-02 DIAGNOSIS — I959 Hypotension, unspecified: Secondary | ICD-10-CM | POA: Diagnosis not present

## 2018-12-02 DIAGNOSIS — I5033 Acute on chronic diastolic (congestive) heart failure: Secondary | ICD-10-CM | POA: Diagnosis not present

## 2018-12-02 DIAGNOSIS — I739 Peripheral vascular disease, unspecified: Secondary | ICD-10-CM | POA: Diagnosis not present

## 2018-12-02 DIAGNOSIS — I509 Heart failure, unspecified: Secondary | ICD-10-CM | POA: Diagnosis not present

## 2018-12-02 DIAGNOSIS — J449 Chronic obstructive pulmonary disease, unspecified: Secondary | ICD-10-CM | POA: Diagnosis not present

## 2018-12-07 DIAGNOSIS — J9600 Acute respiratory failure, unspecified whether with hypoxia or hypercapnia: Secondary | ICD-10-CM | POA: Diagnosis not present

## 2018-12-07 DIAGNOSIS — J449 Chronic obstructive pulmonary disease, unspecified: Secondary | ICD-10-CM | POA: Diagnosis not present

## 2018-12-07 DIAGNOSIS — E118 Type 2 diabetes mellitus with unspecified complications: Secondary | ICD-10-CM | POA: Diagnosis not present

## 2018-12-07 DIAGNOSIS — D649 Anemia, unspecified: Secondary | ICD-10-CM | POA: Diagnosis not present

## 2018-12-07 DIAGNOSIS — E1343 Other specified diabetes mellitus with diabetic autonomic (poly)neuropathy: Secondary | ICD-10-CM | POA: Diagnosis not present

## 2018-12-12 DIAGNOSIS — N390 Urinary tract infection, site not specified: Secondary | ICD-10-CM | POA: Diagnosis not present

## 2018-12-12 DIAGNOSIS — R918 Other nonspecific abnormal finding of lung field: Secondary | ICD-10-CM | POA: Diagnosis not present

## 2018-12-12 DIAGNOSIS — I739 Peripheral vascular disease, unspecified: Secondary | ICD-10-CM | POA: Diagnosis not present

## 2018-12-12 DIAGNOSIS — I4510 Unspecified right bundle-branch block: Secondary | ICD-10-CM | POA: Diagnosis not present

## 2018-12-12 DIAGNOSIS — R410 Disorientation, unspecified: Secondary | ICD-10-CM | POA: Diagnosis not present

## 2018-12-12 DIAGNOSIS — R0902 Hypoxemia: Secondary | ICD-10-CM | POA: Diagnosis not present

## 2018-12-12 DIAGNOSIS — Z452 Encounter for adjustment and management of vascular access device: Secondary | ICD-10-CM | POA: Diagnosis not present

## 2018-12-12 DIAGNOSIS — R6521 Severe sepsis with septic shock: Secondary | ICD-10-CM | POA: Diagnosis not present

## 2018-12-12 DIAGNOSIS — R404 Transient alteration of awareness: Secondary | ICD-10-CM | POA: Diagnosis not present

## 2018-12-12 DIAGNOSIS — J9601 Acute respiratory failure with hypoxia: Secondary | ICD-10-CM | POA: Diagnosis not present

## 2018-12-12 DIAGNOSIS — A419 Sepsis, unspecified organism: Secondary | ICD-10-CM | POA: Diagnosis not present

## 2018-12-12 DIAGNOSIS — Z03818 Encounter for observation for suspected exposure to other biological agents ruled out: Secondary | ICD-10-CM | POA: Diagnosis not present

## 2018-12-12 DIAGNOSIS — R531 Weakness: Secondary | ICD-10-CM | POA: Diagnosis not present

## 2018-12-12 DIAGNOSIS — N179 Acute kidney failure, unspecified: Secondary | ICD-10-CM | POA: Diagnosis not present

## 2018-12-12 DIAGNOSIS — J189 Pneumonia, unspecified organism: Secondary | ICD-10-CM | POA: Diagnosis not present

## 2018-12-12 DIAGNOSIS — R05 Cough: Secondary | ICD-10-CM | POA: Diagnosis not present

## 2018-12-13 DIAGNOSIS — I272 Pulmonary hypertension, unspecified: Secondary | ICD-10-CM | POA: Diagnosis not present

## 2018-12-13 DIAGNOSIS — E11621 Type 2 diabetes mellitus with foot ulcer: Secondary | ICD-10-CM | POA: Diagnosis not present

## 2018-12-13 DIAGNOSIS — D509 Iron deficiency anemia, unspecified: Secondary | ICD-10-CM | POA: Diagnosis not present

## 2018-12-13 DIAGNOSIS — R918 Other nonspecific abnormal finding of lung field: Secondary | ICD-10-CM | POA: Diagnosis not present

## 2018-12-13 DIAGNOSIS — K219 Gastro-esophageal reflux disease without esophagitis: Secondary | ICD-10-CM | POA: Diagnosis not present

## 2018-12-13 DIAGNOSIS — E872 Acidosis: Secondary | ICD-10-CM | POA: Diagnosis not present

## 2018-12-13 DIAGNOSIS — L89151 Pressure ulcer of sacral region, stage 1: Secondary | ICD-10-CM | POA: Diagnosis not present

## 2018-12-13 DIAGNOSIS — I088 Other rheumatic multiple valve diseases: Secondary | ICD-10-CM | POA: Diagnosis not present

## 2018-12-13 DIAGNOSIS — N179 Acute kidney failure, unspecified: Secondary | ICD-10-CM | POA: Diagnosis not present

## 2018-12-13 DIAGNOSIS — J9620 Acute and chronic respiratory failure, unspecified whether with hypoxia or hypercapnia: Secondary | ICD-10-CM | POA: Diagnosis not present

## 2018-12-13 DIAGNOSIS — I739 Peripheral vascular disease, unspecified: Secondary | ICD-10-CM | POA: Diagnosis not present

## 2018-12-13 DIAGNOSIS — G9341 Metabolic encephalopathy: Secondary | ICD-10-CM | POA: Diagnosis not present

## 2018-12-13 DIAGNOSIS — R52 Pain, unspecified: Secondary | ICD-10-CM | POA: Diagnosis not present

## 2018-12-13 DIAGNOSIS — Z4682 Encounter for fitting and adjustment of non-vascular catheter: Secondary | ICD-10-CM | POA: Diagnosis not present

## 2018-12-13 DIAGNOSIS — R7881 Bacteremia: Secondary | ICD-10-CM | POA: Diagnosis not present

## 2018-12-13 DIAGNOSIS — J811 Chronic pulmonary edema: Secondary | ICD-10-CM | POA: Diagnosis not present

## 2018-12-13 DIAGNOSIS — E119 Type 2 diabetes mellitus without complications: Secondary | ICD-10-CM | POA: Diagnosis not present

## 2018-12-13 DIAGNOSIS — R9431 Abnormal electrocardiogram [ECG] [EKG]: Secondary | ICD-10-CM | POA: Diagnosis not present

## 2018-12-13 DIAGNOSIS — R6521 Severe sepsis with septic shock: Secondary | ICD-10-CM | POA: Diagnosis not present

## 2018-12-13 DIAGNOSIS — E1165 Type 2 diabetes mellitus with hyperglycemia: Secondary | ICD-10-CM | POA: Diagnosis not present

## 2018-12-13 DIAGNOSIS — E1122 Type 2 diabetes mellitus with diabetic chronic kidney disease: Secondary | ICD-10-CM | POA: Diagnosis not present

## 2018-12-13 DIAGNOSIS — J9611 Chronic respiratory failure with hypoxia: Secondary | ICD-10-CM | POA: Diagnosis not present

## 2018-12-13 DIAGNOSIS — I130 Hypertensive heart and chronic kidney disease with heart failure and stage 1 through stage 4 chronic kidney disease, or unspecified chronic kidney disease: Secondary | ICD-10-CM | POA: Diagnosis not present

## 2018-12-13 DIAGNOSIS — J9600 Acute respiratory failure, unspecified whether with hypoxia or hypercapnia: Secondary | ICD-10-CM | POA: Diagnosis not present

## 2018-12-13 DIAGNOSIS — L89610 Pressure ulcer of right heel, unstageable: Secondary | ICD-10-CM | POA: Diagnosis not present

## 2018-12-13 DIAGNOSIS — I5032 Chronic diastolic (congestive) heart failure: Secondary | ICD-10-CM | POA: Diagnosis not present

## 2018-12-13 DIAGNOSIS — N170 Acute kidney failure with tubular necrosis: Secondary | ICD-10-CM | POA: Diagnosis not present

## 2018-12-13 DIAGNOSIS — M19071 Primary osteoarthritis, right ankle and foot: Secondary | ICD-10-CM | POA: Diagnosis not present

## 2018-12-13 DIAGNOSIS — J9601 Acute respiratory failure with hypoxia: Secondary | ICD-10-CM | POA: Diagnosis not present

## 2018-12-13 DIAGNOSIS — J90 Pleural effusion, not elsewhere classified: Secondary | ICD-10-CM | POA: Diagnosis not present

## 2018-12-13 DIAGNOSIS — K766 Portal hypertension: Secondary | ICD-10-CM | POA: Diagnosis not present

## 2018-12-13 DIAGNOSIS — J189 Pneumonia, unspecified organism: Secondary | ICD-10-CM | POA: Diagnosis not present

## 2018-12-13 DIAGNOSIS — J449 Chronic obstructive pulmonary disease, unspecified: Secondary | ICD-10-CM | POA: Diagnosis not present

## 2018-12-13 DIAGNOSIS — I959 Hypotension, unspecified: Secondary | ICD-10-CM | POA: Diagnosis not present

## 2018-12-13 DIAGNOSIS — A419 Sepsis, unspecified organism: Secondary | ICD-10-CM | POA: Diagnosis not present

## 2018-12-13 DIAGNOSIS — L89152 Pressure ulcer of sacral region, stage 2: Secondary | ICD-10-CM | POA: Diagnosis not present

## 2018-12-13 DIAGNOSIS — K5900 Constipation, unspecified: Secondary | ICD-10-CM | POA: Diagnosis not present

## 2018-12-13 DIAGNOSIS — M7989 Other specified soft tissue disorders: Secondary | ICD-10-CM | POA: Diagnosis not present

## 2018-12-13 DIAGNOSIS — R339 Retention of urine, unspecified: Secondary | ICD-10-CM | POA: Diagnosis not present

## 2018-12-13 DIAGNOSIS — Z452 Encounter for adjustment and management of vascular access device: Secondary | ICD-10-CM | POA: Diagnosis not present

## 2018-12-13 DIAGNOSIS — M7731 Calcaneal spur, right foot: Secondary | ICD-10-CM | POA: Diagnosis not present

## 2018-12-13 DIAGNOSIS — Z992 Dependence on renal dialysis: Secondary | ICD-10-CM | POA: Diagnosis not present

## 2018-12-13 DIAGNOSIS — R0902 Hypoxemia: Secondary | ICD-10-CM | POA: Diagnosis not present

## 2018-12-13 DIAGNOSIS — I4510 Unspecified right bundle-branch block: Secondary | ICD-10-CM | POA: Diagnosis not present

## 2018-12-13 DIAGNOSIS — J440 Chronic obstructive pulmonary disease with acute lower respiratory infection: Secondary | ICD-10-CM | POA: Diagnosis not present

## 2018-12-13 DIAGNOSIS — R579 Shock, unspecified: Secondary | ICD-10-CM | POA: Diagnosis not present

## 2018-12-13 DIAGNOSIS — E43 Unspecified severe protein-calorie malnutrition: Secondary | ICD-10-CM | POA: Diagnosis not present

## 2018-12-13 DIAGNOSIS — M6281 Muscle weakness (generalized): Secondary | ICD-10-CM | POA: Diagnosis not present

## 2018-12-13 DIAGNOSIS — E875 Hyperkalemia: Secondary | ICD-10-CM | POA: Diagnosis not present

## 2018-12-13 DIAGNOSIS — J9621 Acute and chronic respiratory failure with hypoxia: Secondary | ICD-10-CM | POA: Diagnosis not present

## 2018-12-13 DIAGNOSIS — J9622 Acute and chronic respiratory failure with hypercapnia: Secondary | ICD-10-CM | POA: Diagnosis not present

## 2018-12-13 DIAGNOSIS — R404 Transient alteration of awareness: Secondary | ICD-10-CM | POA: Diagnosis not present

## 2018-12-13 DIAGNOSIS — N390 Urinary tract infection, site not specified: Secondary | ICD-10-CM | POA: Diagnosis not present

## 2018-12-13 DIAGNOSIS — I5033 Acute on chronic diastolic (congestive) heart failure: Secondary | ICD-10-CM | POA: Diagnosis not present

## 2018-12-13 DIAGNOSIS — N186 End stage renal disease: Secondary | ICD-10-CM | POA: Diagnosis not present

## 2018-12-13 DIAGNOSIS — I509 Heart failure, unspecified: Secondary | ICD-10-CM | POA: Diagnosis not present

## 2018-12-13 DIAGNOSIS — D649 Anemia, unspecified: Secondary | ICD-10-CM | POA: Diagnosis not present

## 2018-12-14 DIAGNOSIS — N179 Acute kidney failure, unspecified: Secondary | ICD-10-CM | POA: Diagnosis not present

## 2018-12-14 DIAGNOSIS — R0902 Hypoxemia: Secondary | ICD-10-CM | POA: Diagnosis not present

## 2018-12-14 DIAGNOSIS — R918 Other nonspecific abnormal finding of lung field: Secondary | ICD-10-CM | POA: Diagnosis not present

## 2018-12-15 DIAGNOSIS — R9431 Abnormal electrocardiogram [ECG] [EKG]: Secondary | ICD-10-CM | POA: Diagnosis not present

## 2018-12-15 DIAGNOSIS — N179 Acute kidney failure, unspecified: Secondary | ICD-10-CM | POA: Diagnosis not present

## 2018-12-15 DIAGNOSIS — I4510 Unspecified right bundle-branch block: Secondary | ICD-10-CM | POA: Diagnosis not present

## 2018-12-16 DIAGNOSIS — N179 Acute kidney failure, unspecified: Secondary | ICD-10-CM | POA: Diagnosis not present

## 2019-01-03 MED ORDER — FUROSEMIDE 40 MG PO TABS
40.00 | ORAL_TABLET | ORAL | Status: DC
Start: 2019-01-03 — End: 2019-01-03

## 2019-01-03 MED ORDER — GLUCAGON HCL (DIAGNOSTIC) 1 MG IJ SOLR
1.00 | INTRAMUSCULAR | Status: DC
Start: ? — End: 2019-01-03

## 2019-01-03 MED ORDER — VITAMIN B-COMPLEX PO TABS
1.00 | ORAL_TABLET | ORAL | Status: DC
Start: 2019-01-05 — End: 2019-01-03

## 2019-01-03 MED ORDER — PANTOPRAZOLE SODIUM 40 MG PO TBEC
40.00 | DELAYED_RELEASE_TABLET | ORAL | Status: DC
Start: 2019-01-05 — End: 2019-01-03

## 2019-01-03 MED ORDER — HEPARIN SODIUM (PORCINE) 5000 UNIT/ML IJ SOLN
2.50 | INTRAMUSCULAR | Status: DC
Start: ? — End: 2019-01-03

## 2019-01-03 MED ORDER — GABAPENTIN 300 MG PO CAPS
300.00 | ORAL_CAPSULE | ORAL | Status: DC
Start: 2019-01-05 — End: 2019-01-03

## 2019-01-03 MED ORDER — BISACODYL 10 MG RE SUPP
10.00 | RECTAL | Status: DC
Start: ? — End: 2019-01-03

## 2019-01-03 MED ORDER — ACETAMINOPHEN 325 MG PO TABS
650.00 | ORAL_TABLET | ORAL | Status: DC
Start: ? — End: 2019-01-03

## 2019-01-03 MED ORDER — INSULIN LISPRO 100 UNIT/ML ~~LOC~~ SOLN
0.00 | SUBCUTANEOUS | Status: DC
Start: 2019-01-05 — End: 2019-01-03

## 2019-01-03 MED ORDER — SODIUM CHLORIDE 0.9 % IV SOLN
200.00 | INTRAVENOUS | Status: DC
Start: ? — End: 2019-01-03

## 2019-01-03 MED ORDER — ALLOPURINOL 100 MG PO TABS
100.00 | ORAL_TABLET | ORAL | Status: DC
Start: 2019-01-06 — End: 2019-01-03

## 2019-01-03 MED ORDER — HEPARIN SODIUM (PORCINE) PF 5000 UNIT/0.5ML IJ SOLN
5000.00 | INTRAMUSCULAR | Status: DC
Start: 2019-01-05 — End: 2019-01-03

## 2019-01-03 MED ORDER — HEPARIN SODIUM (PORCINE) 5000 UNIT/ML IJ SOLN
5000.00 | INTRAMUSCULAR | Status: DC
Start: ? — End: 2019-01-03

## 2019-01-03 MED ORDER — SODIUM CHLORIDE 0.9 % IV SOLN
500.00 | INTRAVENOUS | Status: DC
Start: ? — End: 2019-01-03

## 2019-01-03 MED ORDER — ONDANSETRON HCL 4 MG/2ML IJ SOLN
4.00 | INTRAMUSCULAR | Status: DC
Start: ? — End: 2019-01-03

## 2019-01-03 MED ORDER — EPOETIN ALFA-EPBX 10000 UNIT/ML IJ SOLN
10000.00 | INTRAMUSCULAR | Status: DC
Start: ? — End: 2019-01-03

## 2019-01-03 MED ORDER — GLUCOSE 40 % PO GEL
ORAL | Status: DC
Start: ? — End: 2019-01-03

## 2019-01-03 MED ORDER — GENERIC EXTERNAL MEDICATION
Status: DC
Start: ? — End: 2019-01-03

## 2019-01-03 MED ORDER — NITROGLYCERIN 0.4 MG SL SUBL
0.40 | SUBLINGUAL_TABLET | SUBLINGUAL | Status: DC
Start: ? — End: 2019-01-03

## 2019-01-03 MED ORDER — INSULIN GLARGINE 100 UNIT/ML ~~LOC~~ SOLN
30.00 | SUBCUTANEOUS | Status: DC
Start: 2019-01-05 — End: 2019-01-03

## 2019-01-03 MED ORDER — INFLUENZA VAC SPLIT QUAD 0.5 ML IM SUSY
0.50 | PREFILLED_SYRINGE | INTRAMUSCULAR | Status: DC
Start: ? — End: 2019-01-03

## 2019-01-03 MED ORDER — IPRATROPIUM-ALBUTEROL 0.5-2.5 (3) MG/3ML IN SOLN
3.00 | RESPIRATORY_TRACT | Status: DC
Start: ? — End: 2019-01-03

## 2019-01-03 MED ORDER — HEPARIN SODIUM (PORCINE) 1000 UNIT/ML IJ SOLN
60.00 | INTRAMUSCULAR | Status: DC
Start: ? — End: 2019-01-03

## 2019-01-03 MED ORDER — ASPIRIN EC 81 MG PO TBEC
81.00 | DELAYED_RELEASE_TABLET | ORAL | Status: DC
Start: 2019-01-06 — End: 2019-01-03

## 2019-01-03 MED ORDER — DEXTROSE 50 % IV SOLN
25.00 | INTRAVENOUS | Status: DC
Start: ? — End: 2019-01-03

## 2019-01-05 DIAGNOSIS — K5900 Constipation, unspecified: Secondary | ICD-10-CM | POA: Diagnosis not present

## 2019-01-05 DIAGNOSIS — R339 Retention of urine, unspecified: Secondary | ICD-10-CM | POA: Diagnosis not present

## 2019-01-05 DIAGNOSIS — M19071 Primary osteoarthritis, right ankle and foot: Secondary | ICD-10-CM | POA: Diagnosis not present

## 2019-01-05 DIAGNOSIS — L89610 Pressure ulcer of right heel, unstageable: Secondary | ICD-10-CM | POA: Diagnosis not present

## 2019-01-05 DIAGNOSIS — E119 Type 2 diabetes mellitus without complications: Secondary | ICD-10-CM | POA: Diagnosis not present

## 2019-01-05 DIAGNOSIS — D508 Other iron deficiency anemias: Secondary | ICD-10-CM | POA: Diagnosis not present

## 2019-01-05 DIAGNOSIS — I5033 Acute on chronic diastolic (congestive) heart failure: Secondary | ICD-10-CM | POA: Diagnosis not present

## 2019-01-05 DIAGNOSIS — J9622 Acute and chronic respiratory failure with hypercapnia: Secondary | ICD-10-CM | POA: Diagnosis not present

## 2019-01-05 DIAGNOSIS — L89613 Pressure ulcer of right heel, stage 3: Secondary | ICD-10-CM | POA: Diagnosis not present

## 2019-01-05 DIAGNOSIS — M109 Gout, unspecified: Secondary | ICD-10-CM | POA: Diagnosis not present

## 2019-01-05 DIAGNOSIS — I10 Essential (primary) hypertension: Secondary | ICD-10-CM | POA: Diagnosis not present

## 2019-01-05 DIAGNOSIS — E43 Unspecified severe protein-calorie malnutrition: Secondary | ICD-10-CM | POA: Diagnosis not present

## 2019-01-05 DIAGNOSIS — L97412 Non-pressure chronic ulcer of right heel and midfoot with fat layer exposed: Secondary | ICD-10-CM | POA: Diagnosis not present

## 2019-01-05 DIAGNOSIS — N179 Acute kidney failure, unspecified: Secondary | ICD-10-CM | POA: Diagnosis not present

## 2019-01-05 DIAGNOSIS — E11621 Type 2 diabetes mellitus with foot ulcer: Secondary | ICD-10-CM | POA: Diagnosis not present

## 2019-01-05 DIAGNOSIS — I509 Heart failure, unspecified: Secondary | ICD-10-CM | POA: Diagnosis not present

## 2019-01-05 DIAGNOSIS — L97312 Non-pressure chronic ulcer of right ankle with fat layer exposed: Secondary | ICD-10-CM | POA: Diagnosis not present

## 2019-01-05 DIAGNOSIS — M7731 Calcaneal spur, right foot: Secondary | ICD-10-CM | POA: Diagnosis not present

## 2019-01-05 DIAGNOSIS — I5032 Chronic diastolic (congestive) heart failure: Secondary | ICD-10-CM | POA: Diagnosis not present

## 2019-01-05 DIAGNOSIS — D649 Anemia, unspecified: Secondary | ICD-10-CM | POA: Diagnosis not present

## 2019-01-05 DIAGNOSIS — J449 Chronic obstructive pulmonary disease, unspecified: Secondary | ICD-10-CM | POA: Diagnosis not present

## 2019-01-05 DIAGNOSIS — J189 Pneumonia, unspecified organism: Secondary | ICD-10-CM | POA: Diagnosis not present

## 2019-01-05 DIAGNOSIS — K219 Gastro-esophageal reflux disease without esophagitis: Secondary | ICD-10-CM | POA: Diagnosis not present

## 2019-01-05 DIAGNOSIS — L89152 Pressure ulcer of sacral region, stage 2: Secondary | ICD-10-CM | POA: Diagnosis not present

## 2019-01-05 DIAGNOSIS — Z03818 Encounter for observation for suspected exposure to other biological agents ruled out: Secondary | ICD-10-CM | POA: Diagnosis not present

## 2019-01-05 DIAGNOSIS — I739 Peripheral vascular disease, unspecified: Secondary | ICD-10-CM | POA: Diagnosis not present

## 2019-01-05 DIAGNOSIS — J9621 Acute and chronic respiratory failure with hypoxia: Secondary | ICD-10-CM | POA: Diagnosis not present

## 2019-01-05 DIAGNOSIS — E11628 Type 2 diabetes mellitus with other skin complications: Secondary | ICD-10-CM | POA: Diagnosis not present

## 2019-01-05 DIAGNOSIS — M6281 Muscle weakness (generalized): Secondary | ICD-10-CM | POA: Diagnosis not present

## 2019-01-05 DIAGNOSIS — N390 Urinary tract infection, site not specified: Secondary | ICD-10-CM | POA: Diagnosis not present

## 2019-01-05 MED ORDER — GENERIC EXTERNAL MEDICATION
Status: DC
Start: ? — End: 2019-01-05

## 2019-01-05 MED ORDER — INSULIN LISPRO 100 UNIT/ML ~~LOC~~ SOLN
10.00 | SUBCUTANEOUS | Status: DC
Start: 2019-01-05 — End: 2019-01-05

## 2019-01-05 MED ORDER — FUROSEMIDE 20 MG PO TABS
20.00 | ORAL_TABLET | ORAL | Status: DC
Start: 2019-01-06 — End: 2019-01-05

## 2019-01-12 DIAGNOSIS — D508 Other iron deficiency anemias: Secondary | ICD-10-CM | POA: Diagnosis not present

## 2019-01-12 DIAGNOSIS — E119 Type 2 diabetes mellitus without complications: Secondary | ICD-10-CM | POA: Diagnosis not present

## 2019-01-12 DIAGNOSIS — M109 Gout, unspecified: Secondary | ICD-10-CM | POA: Diagnosis not present

## 2019-01-12 DIAGNOSIS — I5032 Chronic diastolic (congestive) heart failure: Secondary | ICD-10-CM | POA: Diagnosis not present

## 2019-01-16 DIAGNOSIS — J449 Chronic obstructive pulmonary disease, unspecified: Secondary | ICD-10-CM | POA: Diagnosis not present

## 2019-01-16 DIAGNOSIS — K219 Gastro-esophageal reflux disease without esophagitis: Secondary | ICD-10-CM | POA: Diagnosis not present

## 2019-01-16 DIAGNOSIS — D508 Other iron deficiency anemias: Secondary | ICD-10-CM | POA: Diagnosis not present

## 2019-01-16 DIAGNOSIS — I5032 Chronic diastolic (congestive) heart failure: Secondary | ICD-10-CM | POA: Diagnosis not present

## 2019-01-23 DIAGNOSIS — L89613 Pressure ulcer of right heel, stage 3: Secondary | ICD-10-CM | POA: Diagnosis not present

## 2019-01-23 DIAGNOSIS — E11621 Type 2 diabetes mellitus with foot ulcer: Secondary | ICD-10-CM | POA: Diagnosis not present

## 2019-01-23 DIAGNOSIS — L97312 Non-pressure chronic ulcer of right ankle with fat layer exposed: Secondary | ICD-10-CM | POA: Diagnosis not present

## 2019-01-23 DIAGNOSIS — I10 Essential (primary) hypertension: Secondary | ICD-10-CM | POA: Diagnosis not present

## 2019-01-23 DIAGNOSIS — E11628 Type 2 diabetes mellitus with other skin complications: Secondary | ICD-10-CM | POA: Diagnosis not present

## 2019-01-25 DIAGNOSIS — M109 Gout, unspecified: Secondary | ICD-10-CM | POA: Diagnosis not present

## 2019-01-25 DIAGNOSIS — I5032 Chronic diastolic (congestive) heart failure: Secondary | ICD-10-CM | POA: Diagnosis not present

## 2019-01-25 DIAGNOSIS — J449 Chronic obstructive pulmonary disease, unspecified: Secondary | ICD-10-CM | POA: Diagnosis not present

## 2019-01-25 DIAGNOSIS — D508 Other iron deficiency anemias: Secondary | ICD-10-CM | POA: Diagnosis not present

## 2019-01-30 DIAGNOSIS — D508 Other iron deficiency anemias: Secondary | ICD-10-CM | POA: Diagnosis not present

## 2019-01-30 DIAGNOSIS — M109 Gout, unspecified: Secondary | ICD-10-CM | POA: Diagnosis not present

## 2019-01-30 DIAGNOSIS — L97412 Non-pressure chronic ulcer of right heel and midfoot with fat layer exposed: Secondary | ICD-10-CM | POA: Diagnosis not present

## 2019-01-30 DIAGNOSIS — L89613 Pressure ulcer of right heel, stage 3: Secondary | ICD-10-CM | POA: Diagnosis not present

## 2019-01-30 DIAGNOSIS — E11621 Type 2 diabetes mellitus with foot ulcer: Secondary | ICD-10-CM | POA: Diagnosis not present

## 2019-01-30 DIAGNOSIS — I5032 Chronic diastolic (congestive) heart failure: Secondary | ICD-10-CM | POA: Diagnosis not present

## 2019-01-30 DIAGNOSIS — J449 Chronic obstructive pulmonary disease, unspecified: Secondary | ICD-10-CM | POA: Diagnosis not present

## 2019-01-30 DIAGNOSIS — E11628 Type 2 diabetes mellitus with other skin complications: Secondary | ICD-10-CM | POA: Diagnosis not present

## 2019-02-03 DIAGNOSIS — K219 Gastro-esophageal reflux disease without esophagitis: Secondary | ICD-10-CM | POA: Diagnosis not present

## 2019-02-03 DIAGNOSIS — D509 Iron deficiency anemia, unspecified: Secondary | ICD-10-CM | POA: Diagnosis not present

## 2019-02-03 DIAGNOSIS — G4733 Obstructive sleep apnea (adult) (pediatric): Secondary | ICD-10-CM | POA: Diagnosis not present

## 2019-02-03 DIAGNOSIS — I110 Hypertensive heart disease with heart failure: Secondary | ICD-10-CM | POA: Diagnosis not present

## 2019-02-03 DIAGNOSIS — M159 Polyosteoarthritis, unspecified: Secondary | ICD-10-CM | POA: Diagnosis not present

## 2019-02-03 DIAGNOSIS — E7800 Pure hypercholesterolemia, unspecified: Secondary | ICD-10-CM | POA: Diagnosis not present

## 2019-02-03 DIAGNOSIS — J9611 Chronic respiratory failure with hypoxia: Secondary | ICD-10-CM | POA: Diagnosis not present

## 2019-02-03 DIAGNOSIS — I5032 Chronic diastolic (congestive) heart failure: Secondary | ICD-10-CM | POA: Diagnosis not present

## 2019-02-03 DIAGNOSIS — J9612 Chronic respiratory failure with hypercapnia: Secondary | ICD-10-CM | POA: Diagnosis not present

## 2019-02-03 DIAGNOSIS — E46 Unspecified protein-calorie malnutrition: Secondary | ICD-10-CM | POA: Diagnosis not present

## 2019-02-03 DIAGNOSIS — I4510 Unspecified right bundle-branch block: Secondary | ICD-10-CM | POA: Diagnosis not present

## 2019-02-03 DIAGNOSIS — E1151 Type 2 diabetes mellitus with diabetic peripheral angiopathy without gangrene: Secondary | ICD-10-CM | POA: Diagnosis not present

## 2019-02-03 DIAGNOSIS — Z87440 Personal history of urinary (tract) infections: Secondary | ICD-10-CM | POA: Diagnosis not present

## 2019-02-03 DIAGNOSIS — R911 Solitary pulmonary nodule: Secondary | ICD-10-CM | POA: Diagnosis not present

## 2019-02-03 DIAGNOSIS — J449 Chronic obstructive pulmonary disease, unspecified: Secondary | ICD-10-CM | POA: Diagnosis not present

## 2019-02-03 DIAGNOSIS — K760 Fatty (change of) liver, not elsewhere classified: Secondary | ICD-10-CM | POA: Diagnosis not present

## 2019-02-03 DIAGNOSIS — Z9981 Dependence on supplemental oxygen: Secondary | ICD-10-CM | POA: Diagnosis not present

## 2019-02-03 DIAGNOSIS — E1142 Type 2 diabetes mellitus with diabetic polyneuropathy: Secondary | ICD-10-CM | POA: Diagnosis not present

## 2019-02-03 DIAGNOSIS — E785 Hyperlipidemia, unspecified: Secondary | ICD-10-CM | POA: Diagnosis not present

## 2019-02-03 DIAGNOSIS — R339 Retention of urine, unspecified: Secondary | ICD-10-CM | POA: Diagnosis not present

## 2019-02-03 DIAGNOSIS — M109 Gout, unspecified: Secondary | ICD-10-CM | POA: Diagnosis not present

## 2019-02-03 DIAGNOSIS — Z7982 Long term (current) use of aspirin: Secondary | ICD-10-CM | POA: Diagnosis not present

## 2019-02-03 DIAGNOSIS — L89610 Pressure ulcer of right heel, unstageable: Secondary | ICD-10-CM | POA: Diagnosis not present

## 2019-02-03 DIAGNOSIS — M8580 Other specified disorders of bone density and structure, unspecified site: Secondary | ICD-10-CM | POA: Diagnosis not present

## 2019-02-03 DIAGNOSIS — Z794 Long term (current) use of insulin: Secondary | ICD-10-CM | POA: Diagnosis not present

## 2019-02-05 DIAGNOSIS — K219 Gastro-esophageal reflux disease without esophagitis: Secondary | ICD-10-CM | POA: Diagnosis not present

## 2019-02-05 DIAGNOSIS — J449 Chronic obstructive pulmonary disease, unspecified: Secondary | ICD-10-CM | POA: Diagnosis not present

## 2019-02-05 DIAGNOSIS — K760 Fatty (change of) liver, not elsewhere classified: Secondary | ICD-10-CM | POA: Diagnosis not present

## 2019-02-05 DIAGNOSIS — E7800 Pure hypercholesterolemia, unspecified: Secondary | ICD-10-CM | POA: Diagnosis not present

## 2019-02-05 DIAGNOSIS — M109 Gout, unspecified: Secondary | ICD-10-CM | POA: Diagnosis not present

## 2019-02-05 DIAGNOSIS — Z9981 Dependence on supplemental oxygen: Secondary | ICD-10-CM | POA: Diagnosis not present

## 2019-02-05 DIAGNOSIS — D509 Iron deficiency anemia, unspecified: Secondary | ICD-10-CM | POA: Diagnosis not present

## 2019-02-05 DIAGNOSIS — Z794 Long term (current) use of insulin: Secondary | ICD-10-CM | POA: Diagnosis not present

## 2019-02-05 DIAGNOSIS — I110 Hypertensive heart disease with heart failure: Secondary | ICD-10-CM | POA: Diagnosis not present

## 2019-02-05 DIAGNOSIS — E785 Hyperlipidemia, unspecified: Secondary | ICD-10-CM | POA: Diagnosis not present

## 2019-02-05 DIAGNOSIS — M8580 Other specified disorders of bone density and structure, unspecified site: Secondary | ICD-10-CM | POA: Diagnosis not present

## 2019-02-05 DIAGNOSIS — I4510 Unspecified right bundle-branch block: Secondary | ICD-10-CM | POA: Diagnosis not present

## 2019-02-05 DIAGNOSIS — R911 Solitary pulmonary nodule: Secondary | ICD-10-CM | POA: Diagnosis not present

## 2019-02-05 DIAGNOSIS — Z87440 Personal history of urinary (tract) infections: Secondary | ICD-10-CM | POA: Diagnosis not present

## 2019-02-05 DIAGNOSIS — I5032 Chronic diastolic (congestive) heart failure: Secondary | ICD-10-CM | POA: Diagnosis not present

## 2019-02-05 DIAGNOSIS — E1142 Type 2 diabetes mellitus with diabetic polyneuropathy: Secondary | ICD-10-CM | POA: Diagnosis not present

## 2019-02-05 DIAGNOSIS — R339 Retention of urine, unspecified: Secondary | ICD-10-CM | POA: Diagnosis not present

## 2019-02-05 DIAGNOSIS — Z7982 Long term (current) use of aspirin: Secondary | ICD-10-CM | POA: Diagnosis not present

## 2019-02-05 DIAGNOSIS — E46 Unspecified protein-calorie malnutrition: Secondary | ICD-10-CM | POA: Diagnosis not present

## 2019-02-05 DIAGNOSIS — M159 Polyosteoarthritis, unspecified: Secondary | ICD-10-CM | POA: Diagnosis not present

## 2019-02-05 DIAGNOSIS — G4733 Obstructive sleep apnea (adult) (pediatric): Secondary | ICD-10-CM | POA: Diagnosis not present

## 2019-02-05 DIAGNOSIS — J9611 Chronic respiratory failure with hypoxia: Secondary | ICD-10-CM | POA: Diagnosis not present

## 2019-02-05 DIAGNOSIS — J9612 Chronic respiratory failure with hypercapnia: Secondary | ICD-10-CM | POA: Diagnosis not present

## 2019-02-05 DIAGNOSIS — L89610 Pressure ulcer of right heel, unstageable: Secondary | ICD-10-CM | POA: Diagnosis not present

## 2019-02-05 DIAGNOSIS — E1151 Type 2 diabetes mellitus with diabetic peripheral angiopathy without gangrene: Secondary | ICD-10-CM | POA: Diagnosis not present

## 2019-02-06 DIAGNOSIS — D509 Iron deficiency anemia, unspecified: Secondary | ICD-10-CM | POA: Diagnosis not present

## 2019-02-06 DIAGNOSIS — J449 Chronic obstructive pulmonary disease, unspecified: Secondary | ICD-10-CM | POA: Diagnosis not present

## 2019-02-06 DIAGNOSIS — I110 Hypertensive heart disease with heart failure: Secondary | ICD-10-CM | POA: Diagnosis not present

## 2019-02-06 DIAGNOSIS — E1142 Type 2 diabetes mellitus with diabetic polyneuropathy: Secondary | ICD-10-CM | POA: Diagnosis not present

## 2019-02-06 DIAGNOSIS — E785 Hyperlipidemia, unspecified: Secondary | ICD-10-CM | POA: Diagnosis not present

## 2019-02-06 DIAGNOSIS — J9611 Chronic respiratory failure with hypoxia: Secondary | ICD-10-CM | POA: Diagnosis not present

## 2019-02-06 DIAGNOSIS — K219 Gastro-esophageal reflux disease without esophagitis: Secondary | ICD-10-CM | POA: Diagnosis not present

## 2019-02-06 DIAGNOSIS — K760 Fatty (change of) liver, not elsewhere classified: Secondary | ICD-10-CM | POA: Diagnosis not present

## 2019-02-06 DIAGNOSIS — I4510 Unspecified right bundle-branch block: Secondary | ICD-10-CM | POA: Diagnosis not present

## 2019-02-06 DIAGNOSIS — M109 Gout, unspecified: Secondary | ICD-10-CM | POA: Diagnosis not present

## 2019-02-06 DIAGNOSIS — L89610 Pressure ulcer of right heel, unstageable: Secondary | ICD-10-CM | POA: Diagnosis not present

## 2019-02-06 DIAGNOSIS — M159 Polyosteoarthritis, unspecified: Secondary | ICD-10-CM | POA: Diagnosis not present

## 2019-02-06 DIAGNOSIS — G4733 Obstructive sleep apnea (adult) (pediatric): Secondary | ICD-10-CM | POA: Diagnosis not present

## 2019-02-06 DIAGNOSIS — Z87440 Personal history of urinary (tract) infections: Secondary | ICD-10-CM | POA: Diagnosis not present

## 2019-02-06 DIAGNOSIS — Z9981 Dependence on supplemental oxygen: Secondary | ICD-10-CM | POA: Diagnosis not present

## 2019-02-06 DIAGNOSIS — E46 Unspecified protein-calorie malnutrition: Secondary | ICD-10-CM | POA: Diagnosis not present

## 2019-02-06 DIAGNOSIS — R911 Solitary pulmonary nodule: Secondary | ICD-10-CM | POA: Diagnosis not present

## 2019-02-06 DIAGNOSIS — I5032 Chronic diastolic (congestive) heart failure: Secondary | ICD-10-CM | POA: Diagnosis not present

## 2019-02-06 DIAGNOSIS — E7800 Pure hypercholesterolemia, unspecified: Secondary | ICD-10-CM | POA: Diagnosis not present

## 2019-02-06 DIAGNOSIS — Z794 Long term (current) use of insulin: Secondary | ICD-10-CM | POA: Diagnosis not present

## 2019-02-06 DIAGNOSIS — Z7982 Long term (current) use of aspirin: Secondary | ICD-10-CM | POA: Diagnosis not present

## 2019-02-06 DIAGNOSIS — R339 Retention of urine, unspecified: Secondary | ICD-10-CM | POA: Diagnosis not present

## 2019-02-06 DIAGNOSIS — E1151 Type 2 diabetes mellitus with diabetic peripheral angiopathy without gangrene: Secondary | ICD-10-CM | POA: Diagnosis not present

## 2019-02-06 DIAGNOSIS — J9612 Chronic respiratory failure with hypercapnia: Secondary | ICD-10-CM | POA: Diagnosis not present

## 2019-02-06 DIAGNOSIS — M8580 Other specified disorders of bone density and structure, unspecified site: Secondary | ICD-10-CM | POA: Diagnosis not present

## 2019-02-07 ENCOUNTER — Encounter: Payer: Self-pay | Admitting: *Deleted

## 2019-02-07 ENCOUNTER — Other Ambulatory Visit: Payer: Self-pay | Admitting: *Deleted

## 2019-02-07 DIAGNOSIS — J9612 Chronic respiratory failure with hypercapnia: Secondary | ICD-10-CM | POA: Diagnosis not present

## 2019-02-07 DIAGNOSIS — I5032 Chronic diastolic (congestive) heart failure: Secondary | ICD-10-CM | POA: Diagnosis not present

## 2019-02-07 DIAGNOSIS — K760 Fatty (change of) liver, not elsewhere classified: Secondary | ICD-10-CM | POA: Diagnosis not present

## 2019-02-07 DIAGNOSIS — D509 Iron deficiency anemia, unspecified: Secondary | ICD-10-CM | POA: Diagnosis not present

## 2019-02-07 DIAGNOSIS — R339 Retention of urine, unspecified: Secondary | ICD-10-CM | POA: Diagnosis not present

## 2019-02-07 DIAGNOSIS — M109 Gout, unspecified: Secondary | ICD-10-CM | POA: Diagnosis not present

## 2019-02-07 DIAGNOSIS — G4733 Obstructive sleep apnea (adult) (pediatric): Secondary | ICD-10-CM | POA: Diagnosis not present

## 2019-02-07 DIAGNOSIS — E1142 Type 2 diabetes mellitus with diabetic polyneuropathy: Secondary | ICD-10-CM | POA: Diagnosis not present

## 2019-02-07 DIAGNOSIS — E46 Unspecified protein-calorie malnutrition: Secondary | ICD-10-CM | POA: Diagnosis not present

## 2019-02-07 DIAGNOSIS — Z7982 Long term (current) use of aspirin: Secondary | ICD-10-CM | POA: Diagnosis not present

## 2019-02-07 DIAGNOSIS — R911 Solitary pulmonary nodule: Secondary | ICD-10-CM | POA: Diagnosis not present

## 2019-02-07 DIAGNOSIS — E1151 Type 2 diabetes mellitus with diabetic peripheral angiopathy without gangrene: Secondary | ICD-10-CM | POA: Diagnosis not present

## 2019-02-07 DIAGNOSIS — J9611 Chronic respiratory failure with hypoxia: Secondary | ICD-10-CM | POA: Diagnosis not present

## 2019-02-07 DIAGNOSIS — E7800 Pure hypercholesterolemia, unspecified: Secondary | ICD-10-CM | POA: Diagnosis not present

## 2019-02-07 DIAGNOSIS — L89610 Pressure ulcer of right heel, unstageable: Secondary | ICD-10-CM | POA: Diagnosis not present

## 2019-02-07 DIAGNOSIS — Z9981 Dependence on supplemental oxygen: Secondary | ICD-10-CM | POA: Diagnosis not present

## 2019-02-07 DIAGNOSIS — Z794 Long term (current) use of insulin: Secondary | ICD-10-CM | POA: Diagnosis not present

## 2019-02-07 DIAGNOSIS — K219 Gastro-esophageal reflux disease without esophagitis: Secondary | ICD-10-CM | POA: Diagnosis not present

## 2019-02-07 DIAGNOSIS — Z87440 Personal history of urinary (tract) infections: Secondary | ICD-10-CM | POA: Diagnosis not present

## 2019-02-07 DIAGNOSIS — M8580 Other specified disorders of bone density and structure, unspecified site: Secondary | ICD-10-CM | POA: Diagnosis not present

## 2019-02-07 DIAGNOSIS — I110 Hypertensive heart disease with heart failure: Secondary | ICD-10-CM | POA: Diagnosis not present

## 2019-02-07 DIAGNOSIS — M159 Polyosteoarthritis, unspecified: Secondary | ICD-10-CM | POA: Diagnosis not present

## 2019-02-07 DIAGNOSIS — E785 Hyperlipidemia, unspecified: Secondary | ICD-10-CM | POA: Diagnosis not present

## 2019-02-07 DIAGNOSIS — I4510 Unspecified right bundle-branch block: Secondary | ICD-10-CM | POA: Diagnosis not present

## 2019-02-07 DIAGNOSIS — J449 Chronic obstructive pulmonary disease, unspecified: Secondary | ICD-10-CM | POA: Diagnosis not present

## 2019-02-07 NOTE — Patient Outreach (Signed)
[name] Care Management [place] Telephone Outreach EMMI Red alert: General Discharge Transition of Care day # 1 Post- SNF discharge day: uncertain; patient unable to remember SNF discharge date  02/07/2019  Cassandra Barnes 1945/11/26 [id number]  EMMI- General Discharge-- on APL RED ON [place] EMMI call day # 1 (02/04/2019) Red Alert Reason: "Know who to call for changes in condition: NO"  Successful telephone outreach to Cassandra Barnes, 73 y/o female with recent hospitalization at [facility] December 13, 2018 for AMS/ sepsis/ hypoxic respiratory failure related to pneumonia/ UTI; patient was discharged from hospital to SNF for rehabilitation and was apparently recently discharged from SNF back home to self care with home health services in place.  Review of EMR indicated that patient has history including, but not limited to, COPD, on home O2; OSA with CPAP; DM; and morbid obesity.  HIPAA/ identity verified with patient during phone call today.  Purpose of call/ [facility] CM services discussed with patient who provides verbal consent for [facility] CM involvement in her care post- SNF discharge.    Patient reports that she does not remember receiving any automated computerized calls from [facility]; she states that she does understand what to do/ who to call if she has concerns or changes in her condition.  Patient reports that she is doing well and she denies concerns/ issues/ problems post- SNF discharge.  She denies pain, new/ recent falls post- SNF discharge.  Patient sounds to be in no apparent distress throughout entirety of phone call today.  Patient further reports: -- has all medications "as far as" she knows; states that her caregiver/ daughter manages her medications and "just stepped out" and is unavailable for medication review; reports that the home health nurse reviewed all of her medications today during her visit -- home health services are in place and  have arrived- patient states that the team visited her today -- has scheduled provider appointments to the wound center on 02/13/19 for a "sore" on her heel which she reports "just showed up;" states she has been active with the wound center for a long time; has scheduled PCP office visit on 02/17/2019 -- reports she will use established transportation services through community resource "[employer]" as she is unable to get in and out of a "regular car" due to morbid obesity and being bed/ wheelchair bound- states she has used this resource for a "long time" -- reports that she requires assistance for all ADL's and iADL's- reports this is due to her inability to walk due and being wheelchair bound; wears "diapers" and reports the "only thing" she is able to do independently is to feed herself meals- requires assistance with preparing meals -- SDOH completed for: transportation; food insecurity; and financial resource strain -- denies need for community resources; reports that she lives with her husband and her daughter; reports daughter is her primary caregiver at home -- reports does not currently have exisisting [employer] Direct[employer] in place and adamantly declines desire to do so -- denies issues with breathing status post SNF discharge; states she continues using home O2 at 2 L/min as per baseline; reports she is not using CPAP since SNF discharge, stating that she did not use while at SNF-- stated that she does not like wearing CPAP and "if they didn't use it at the SNF and the hospital, why should I do it now?" Encouraged patient to discuss with PCP at upcoming scheduled appointment whether this treatment is indicated at  home, patient states she will "think about it"  Patient states she is tired of talking and "is doing fine;" declines my request to speak with her daughter/ caregiver, again stating that "she stepped outside."  Patient denies further issues, concerns, or problems today.  I provided patient  with my direct phone number, the main [place] office phone number, and the [facility] CM 24-hour nurse advice phone number should issues arise prior to next scheduled [place] outreach by phone next week.  Encouraged patient or caregiver to contact me directly if needs, questions, issues, or concerns arise prior to next scheduled outreach; patient agreed to do so.  Plan:  Patient will take medications as prescribed and will attend all scheduled provider appointments  Patient will promptly notify care providers for any new concerns/ issues/ problems that arise  Patient will actively participate in home health services as ordered post-hospital discharge  I will make patient's PCP aware of [facility] CM involvement in patient's care-- will send barriers letter  [place] outreach for transition of care to continue with scheduled phone call next week  [facility] CM Care Plan Problem One     Most Recent Value  Care Plan Problem One  High risk for hospital readmission related to/ as evidenced by recent hospital and subsequent SNF visit for sepsis and acute respiratory failure  Role Documenting the Problem One  Care Management Coordinator  Care Plan for Problem One  Active  [facility] Long Term Goal   Over the next 31 days, patient will not experience hospital readmission, as evidenced by patient reporting and review of EMR during [facility] RN CM outreach  [facility] Long Term Goal Start Date  02/07/19  Interventions for Problem One Long Term Goal  Discussed with patient recent hospitalization and SNF rehabilitation visits,  discussed current clinical condition and confirmed that patient has no current clinical concerns,  discussed [facility] CM services and initiaited [facility] CM transition of care program  [facility] CM Short Term Goal #1   Over the next 30 days, patient will actively participate in home health services as evidenced by patient reporting and collaboration with home health team as indicated during [facility] RN CM outreach   [facility] CM Short Term Goal #1 Start Date  02/07/19  Interventions for Short Term Goal #1  Confirmed that patient has home health services in place and that she has had home visits from team,  confirmed that she has contact information for home health team,  encouraged patient's active participation in all disciplines  [facility] CM Short Term Goal #2   Over the next 30 days, patient will attend all scheduled provider appointments as evidenced by patient reporting and collaboration with care providers as indicated during [facility] RN CM outreach  [facility] CM Short Term Goal #2 Start Date  02/07/19  Interventions for Short Term Goal #2  Confirmed with patient that she understands who to call for changes/ concerns and confirmed that she has scheduled provider office visits with wound clinic and with PCP,  confirmed that patient has active transportation services through community transportation resources     I appreciate the opportunity to participate in Bailei's care,   Oneta Rack, RN, BSN, [employer] Coordinator [facility] Care Management  [phone]

## 2019-02-08 DIAGNOSIS — Z9981 Dependence on supplemental oxygen: Secondary | ICD-10-CM | POA: Diagnosis not present

## 2019-02-08 DIAGNOSIS — K219 Gastro-esophageal reflux disease without esophagitis: Secondary | ICD-10-CM | POA: Diagnosis not present

## 2019-02-08 DIAGNOSIS — E46 Unspecified protein-calorie malnutrition: Secondary | ICD-10-CM | POA: Diagnosis not present

## 2019-02-08 DIAGNOSIS — R911 Solitary pulmonary nodule: Secondary | ICD-10-CM | POA: Diagnosis not present

## 2019-02-08 DIAGNOSIS — E7800 Pure hypercholesterolemia, unspecified: Secondary | ICD-10-CM | POA: Diagnosis not present

## 2019-02-08 DIAGNOSIS — E785 Hyperlipidemia, unspecified: Secondary | ICD-10-CM | POA: Diagnosis not present

## 2019-02-08 DIAGNOSIS — R339 Retention of urine, unspecified: Secondary | ICD-10-CM | POA: Diagnosis not present

## 2019-02-08 DIAGNOSIS — I5032 Chronic diastolic (congestive) heart failure: Secondary | ICD-10-CM | POA: Diagnosis not present

## 2019-02-08 DIAGNOSIS — I4510 Unspecified right bundle-branch block: Secondary | ICD-10-CM | POA: Diagnosis not present

## 2019-02-08 DIAGNOSIS — I110 Hypertensive heart disease with heart failure: Secondary | ICD-10-CM | POA: Diagnosis not present

## 2019-02-08 DIAGNOSIS — M159 Polyosteoarthritis, unspecified: Secondary | ICD-10-CM | POA: Diagnosis not present

## 2019-02-08 DIAGNOSIS — J9612 Chronic respiratory failure with hypercapnia: Secondary | ICD-10-CM | POA: Diagnosis not present

## 2019-02-08 DIAGNOSIS — E1151 Type 2 diabetes mellitus with diabetic peripheral angiopathy without gangrene: Secondary | ICD-10-CM | POA: Diagnosis not present

## 2019-02-08 DIAGNOSIS — Z87440 Personal history of urinary (tract) infections: Secondary | ICD-10-CM | POA: Diagnosis not present

## 2019-02-08 DIAGNOSIS — J449 Chronic obstructive pulmonary disease, unspecified: Secondary | ICD-10-CM | POA: Diagnosis not present

## 2019-02-08 DIAGNOSIS — Z7982 Long term (current) use of aspirin: Secondary | ICD-10-CM | POA: Diagnosis not present

## 2019-02-08 DIAGNOSIS — Z794 Long term (current) use of insulin: Secondary | ICD-10-CM | POA: Diagnosis not present

## 2019-02-08 DIAGNOSIS — J9611 Chronic respiratory failure with hypoxia: Secondary | ICD-10-CM | POA: Diagnosis not present

## 2019-02-08 DIAGNOSIS — M109 Gout, unspecified: Secondary | ICD-10-CM | POA: Diagnosis not present

## 2019-02-08 DIAGNOSIS — K760 Fatty (change of) liver, not elsewhere classified: Secondary | ICD-10-CM | POA: Diagnosis not present

## 2019-02-08 DIAGNOSIS — E1142 Type 2 diabetes mellitus with diabetic polyneuropathy: Secondary | ICD-10-CM | POA: Diagnosis not present

## 2019-02-08 DIAGNOSIS — G4733 Obstructive sleep apnea (adult) (pediatric): Secondary | ICD-10-CM | POA: Diagnosis not present

## 2019-02-08 DIAGNOSIS — M8580 Other specified disorders of bone density and structure, unspecified site: Secondary | ICD-10-CM | POA: Diagnosis not present

## 2019-02-08 DIAGNOSIS — L89610 Pressure ulcer of right heel, unstageable: Secondary | ICD-10-CM | POA: Diagnosis not present

## 2019-02-08 DIAGNOSIS — D509 Iron deficiency anemia, unspecified: Secondary | ICD-10-CM | POA: Diagnosis not present

## 2019-02-10 DIAGNOSIS — Z7982 Long term (current) use of aspirin: Secondary | ICD-10-CM | POA: Diagnosis not present

## 2019-02-10 DIAGNOSIS — E7800 Pure hypercholesterolemia, unspecified: Secondary | ICD-10-CM | POA: Diagnosis not present

## 2019-02-10 DIAGNOSIS — I5032 Chronic diastolic (congestive) heart failure: Secondary | ICD-10-CM | POA: Diagnosis not present

## 2019-02-10 DIAGNOSIS — K760 Fatty (change of) liver, not elsewhere classified: Secondary | ICD-10-CM | POA: Diagnosis not present

## 2019-02-10 DIAGNOSIS — E1142 Type 2 diabetes mellitus with diabetic polyneuropathy: Secondary | ICD-10-CM | POA: Diagnosis not present

## 2019-02-10 DIAGNOSIS — I110 Hypertensive heart disease with heart failure: Secondary | ICD-10-CM | POA: Diagnosis not present

## 2019-02-10 DIAGNOSIS — M8580 Other specified disorders of bone density and structure, unspecified site: Secondary | ICD-10-CM | POA: Diagnosis not present

## 2019-02-10 DIAGNOSIS — E785 Hyperlipidemia, unspecified: Secondary | ICD-10-CM | POA: Diagnosis not present

## 2019-02-10 DIAGNOSIS — E46 Unspecified protein-calorie malnutrition: Secondary | ICD-10-CM | POA: Diagnosis not present

## 2019-02-10 DIAGNOSIS — J449 Chronic obstructive pulmonary disease, unspecified: Secondary | ICD-10-CM | POA: Diagnosis not present

## 2019-02-10 DIAGNOSIS — R339 Retention of urine, unspecified: Secondary | ICD-10-CM | POA: Diagnosis not present

## 2019-02-10 DIAGNOSIS — L89610 Pressure ulcer of right heel, unstageable: Secondary | ICD-10-CM | POA: Diagnosis not present

## 2019-02-10 DIAGNOSIS — E1151 Type 2 diabetes mellitus with diabetic peripheral angiopathy without gangrene: Secondary | ICD-10-CM | POA: Diagnosis not present

## 2019-02-10 DIAGNOSIS — K219 Gastro-esophageal reflux disease without esophagitis: Secondary | ICD-10-CM | POA: Diagnosis not present

## 2019-02-10 DIAGNOSIS — M109 Gout, unspecified: Secondary | ICD-10-CM | POA: Diagnosis not present

## 2019-02-10 DIAGNOSIS — J9611 Chronic respiratory failure with hypoxia: Secondary | ICD-10-CM | POA: Diagnosis not present

## 2019-02-10 DIAGNOSIS — R911 Solitary pulmonary nodule: Secondary | ICD-10-CM | POA: Diagnosis not present

## 2019-02-10 DIAGNOSIS — I4510 Unspecified right bundle-branch block: Secondary | ICD-10-CM | POA: Diagnosis not present

## 2019-02-10 DIAGNOSIS — Z87440 Personal history of urinary (tract) infections: Secondary | ICD-10-CM | POA: Diagnosis not present

## 2019-02-10 DIAGNOSIS — Z794 Long term (current) use of insulin: Secondary | ICD-10-CM | POA: Diagnosis not present

## 2019-02-10 DIAGNOSIS — Z9981 Dependence on supplemental oxygen: Secondary | ICD-10-CM | POA: Diagnosis not present

## 2019-02-10 DIAGNOSIS — G4733 Obstructive sleep apnea (adult) (pediatric): Secondary | ICD-10-CM | POA: Diagnosis not present

## 2019-02-10 DIAGNOSIS — D509 Iron deficiency anemia, unspecified: Secondary | ICD-10-CM | POA: Diagnosis not present

## 2019-02-10 DIAGNOSIS — M159 Polyosteoarthritis, unspecified: Secondary | ICD-10-CM | POA: Diagnosis not present

## 2019-02-10 DIAGNOSIS — J9612 Chronic respiratory failure with hypercapnia: Secondary | ICD-10-CM | POA: Diagnosis not present

## 2019-02-13 DIAGNOSIS — L89613 Pressure ulcer of right heel, stage 3: Secondary | ICD-10-CM | POA: Diagnosis not present

## 2019-02-13 DIAGNOSIS — I10 Essential (primary) hypertension: Secondary | ICD-10-CM | POA: Diagnosis not present

## 2019-02-13 DIAGNOSIS — L97412 Non-pressure chronic ulcer of right heel and midfoot with fat layer exposed: Secondary | ICD-10-CM | POA: Diagnosis not present

## 2019-02-13 DIAGNOSIS — E11621 Type 2 diabetes mellitus with foot ulcer: Secondary | ICD-10-CM | POA: Diagnosis not present

## 2019-02-14 DIAGNOSIS — R911 Solitary pulmonary nodule: Secondary | ICD-10-CM | POA: Diagnosis not present

## 2019-02-14 DIAGNOSIS — E1142 Type 2 diabetes mellitus with diabetic polyneuropathy: Secondary | ICD-10-CM | POA: Diagnosis not present

## 2019-02-14 DIAGNOSIS — J9611 Chronic respiratory failure with hypoxia: Secondary | ICD-10-CM | POA: Diagnosis not present

## 2019-02-14 DIAGNOSIS — Z9981 Dependence on supplemental oxygen: Secondary | ICD-10-CM | POA: Diagnosis not present

## 2019-02-14 DIAGNOSIS — K760 Fatty (change of) liver, not elsewhere classified: Secondary | ICD-10-CM | POA: Diagnosis not present

## 2019-02-14 DIAGNOSIS — E7800 Pure hypercholesterolemia, unspecified: Secondary | ICD-10-CM | POA: Diagnosis not present

## 2019-02-14 DIAGNOSIS — L89610 Pressure ulcer of right heel, unstageable: Secondary | ICD-10-CM | POA: Diagnosis not present

## 2019-02-14 DIAGNOSIS — J449 Chronic obstructive pulmonary disease, unspecified: Secondary | ICD-10-CM | POA: Diagnosis not present

## 2019-02-14 DIAGNOSIS — I5032 Chronic diastolic (congestive) heart failure: Secondary | ICD-10-CM | POA: Diagnosis not present

## 2019-02-14 DIAGNOSIS — G4733 Obstructive sleep apnea (adult) (pediatric): Secondary | ICD-10-CM | POA: Diagnosis not present

## 2019-02-14 DIAGNOSIS — M159 Polyosteoarthritis, unspecified: Secondary | ICD-10-CM | POA: Diagnosis not present

## 2019-02-14 DIAGNOSIS — Z7982 Long term (current) use of aspirin: Secondary | ICD-10-CM | POA: Diagnosis not present

## 2019-02-14 DIAGNOSIS — J9612 Chronic respiratory failure with hypercapnia: Secondary | ICD-10-CM | POA: Diagnosis not present

## 2019-02-14 DIAGNOSIS — M8580 Other specified disorders of bone density and structure, unspecified site: Secondary | ICD-10-CM | POA: Diagnosis not present

## 2019-02-14 DIAGNOSIS — E46 Unspecified protein-calorie malnutrition: Secondary | ICD-10-CM | POA: Diagnosis not present

## 2019-02-14 DIAGNOSIS — Z87440 Personal history of urinary (tract) infections: Secondary | ICD-10-CM | POA: Diagnosis not present

## 2019-02-14 DIAGNOSIS — R339 Retention of urine, unspecified: Secondary | ICD-10-CM | POA: Diagnosis not present

## 2019-02-14 DIAGNOSIS — E1151 Type 2 diabetes mellitus with diabetic peripheral angiopathy without gangrene: Secondary | ICD-10-CM | POA: Diagnosis not present

## 2019-02-14 DIAGNOSIS — Z794 Long term (current) use of insulin: Secondary | ICD-10-CM | POA: Diagnosis not present

## 2019-02-14 DIAGNOSIS — E785 Hyperlipidemia, unspecified: Secondary | ICD-10-CM | POA: Diagnosis not present

## 2019-02-14 DIAGNOSIS — I4510 Unspecified right bundle-branch block: Secondary | ICD-10-CM | POA: Diagnosis not present

## 2019-02-14 DIAGNOSIS — K219 Gastro-esophageal reflux disease without esophagitis: Secondary | ICD-10-CM | POA: Diagnosis not present

## 2019-02-14 DIAGNOSIS — M109 Gout, unspecified: Secondary | ICD-10-CM | POA: Diagnosis not present

## 2019-02-14 DIAGNOSIS — D509 Iron deficiency anemia, unspecified: Secondary | ICD-10-CM | POA: Diagnosis not present

## 2019-02-14 DIAGNOSIS — I110 Hypertensive heart disease with heart failure: Secondary | ICD-10-CM | POA: Diagnosis not present

## 2019-02-15 DIAGNOSIS — Z7982 Long term (current) use of aspirin: Secondary | ICD-10-CM | POA: Diagnosis not present

## 2019-02-15 DIAGNOSIS — E1151 Type 2 diabetes mellitus with diabetic peripheral angiopathy without gangrene: Secondary | ICD-10-CM | POA: Diagnosis not present

## 2019-02-15 DIAGNOSIS — I4510 Unspecified right bundle-branch block: Secondary | ICD-10-CM | POA: Diagnosis not present

## 2019-02-15 DIAGNOSIS — G4733 Obstructive sleep apnea (adult) (pediatric): Secondary | ICD-10-CM | POA: Diagnosis not present

## 2019-02-15 DIAGNOSIS — D509 Iron deficiency anemia, unspecified: Secondary | ICD-10-CM | POA: Diagnosis not present

## 2019-02-15 DIAGNOSIS — J449 Chronic obstructive pulmonary disease, unspecified: Secondary | ICD-10-CM | POA: Diagnosis not present

## 2019-02-15 DIAGNOSIS — E1142 Type 2 diabetes mellitus with diabetic polyneuropathy: Secondary | ICD-10-CM | POA: Diagnosis not present

## 2019-02-15 DIAGNOSIS — L89610 Pressure ulcer of right heel, unstageable: Secondary | ICD-10-CM | POA: Diagnosis not present

## 2019-02-15 DIAGNOSIS — I5032 Chronic diastolic (congestive) heart failure: Secondary | ICD-10-CM | POA: Diagnosis not present

## 2019-02-15 DIAGNOSIS — M159 Polyosteoarthritis, unspecified: Secondary | ICD-10-CM | POA: Diagnosis not present

## 2019-02-15 DIAGNOSIS — E46 Unspecified protein-calorie malnutrition: Secondary | ICD-10-CM | POA: Diagnosis not present

## 2019-02-15 DIAGNOSIS — Z9981 Dependence on supplemental oxygen: Secondary | ICD-10-CM | POA: Diagnosis not present

## 2019-02-15 DIAGNOSIS — M109 Gout, unspecified: Secondary | ICD-10-CM | POA: Diagnosis not present

## 2019-02-15 DIAGNOSIS — J9612 Chronic respiratory failure with hypercapnia: Secondary | ICD-10-CM | POA: Diagnosis not present

## 2019-02-15 DIAGNOSIS — K760 Fatty (change of) liver, not elsewhere classified: Secondary | ICD-10-CM | POA: Diagnosis not present

## 2019-02-15 DIAGNOSIS — I110 Hypertensive heart disease with heart failure: Secondary | ICD-10-CM | POA: Diagnosis not present

## 2019-02-15 DIAGNOSIS — E7800 Pure hypercholesterolemia, unspecified: Secondary | ICD-10-CM | POA: Diagnosis not present

## 2019-02-15 DIAGNOSIS — J9611 Chronic respiratory failure with hypoxia: Secondary | ICD-10-CM | POA: Diagnosis not present

## 2019-02-15 DIAGNOSIS — R911 Solitary pulmonary nodule: Secondary | ICD-10-CM | POA: Diagnosis not present

## 2019-02-15 DIAGNOSIS — K219 Gastro-esophageal reflux disease without esophagitis: Secondary | ICD-10-CM | POA: Diagnosis not present

## 2019-02-15 DIAGNOSIS — Z87440 Personal history of urinary (tract) infections: Secondary | ICD-10-CM | POA: Diagnosis not present

## 2019-02-15 DIAGNOSIS — E785 Hyperlipidemia, unspecified: Secondary | ICD-10-CM | POA: Diagnosis not present

## 2019-02-15 DIAGNOSIS — R339 Retention of urine, unspecified: Secondary | ICD-10-CM | POA: Diagnosis not present

## 2019-02-15 DIAGNOSIS — Z794 Long term (current) use of insulin: Secondary | ICD-10-CM | POA: Diagnosis not present

## 2019-02-15 DIAGNOSIS — M8580 Other specified disorders of bone density and structure, unspecified site: Secondary | ICD-10-CM | POA: Diagnosis not present

## 2019-02-16 ENCOUNTER — Encounter: Payer: Self-pay | Admitting: *Deleted

## 2019-02-16 ENCOUNTER — Other Ambulatory Visit: Payer: Self-pay | Admitting: *Deleted

## 2019-02-16 DIAGNOSIS — L89610 Pressure ulcer of right heel, unstageable: Secondary | ICD-10-CM | POA: Diagnosis not present

## 2019-02-16 DIAGNOSIS — K760 Fatty (change of) liver, not elsewhere classified: Secondary | ICD-10-CM | POA: Diagnosis not present

## 2019-02-16 DIAGNOSIS — R339 Retention of urine, unspecified: Secondary | ICD-10-CM | POA: Diagnosis not present

## 2019-02-16 DIAGNOSIS — D509 Iron deficiency anemia, unspecified: Secondary | ICD-10-CM | POA: Diagnosis not present

## 2019-02-16 DIAGNOSIS — I5032 Chronic diastolic (congestive) heart failure: Secondary | ICD-10-CM | POA: Diagnosis not present

## 2019-02-16 DIAGNOSIS — Z87440 Personal history of urinary (tract) infections: Secondary | ICD-10-CM | POA: Diagnosis not present

## 2019-02-16 DIAGNOSIS — J9611 Chronic respiratory failure with hypoxia: Secondary | ICD-10-CM | POA: Diagnosis not present

## 2019-02-16 DIAGNOSIS — Z794 Long term (current) use of insulin: Secondary | ICD-10-CM | POA: Diagnosis not present

## 2019-02-16 DIAGNOSIS — E1151 Type 2 diabetes mellitus with diabetic peripheral angiopathy without gangrene: Secondary | ICD-10-CM | POA: Diagnosis not present

## 2019-02-16 DIAGNOSIS — I4510 Unspecified right bundle-branch block: Secondary | ICD-10-CM | POA: Diagnosis not present

## 2019-02-16 DIAGNOSIS — M8580 Other specified disorders of bone density and structure, unspecified site: Secondary | ICD-10-CM | POA: Diagnosis not present

## 2019-02-16 DIAGNOSIS — G4733 Obstructive sleep apnea (adult) (pediatric): Secondary | ICD-10-CM | POA: Diagnosis not present

## 2019-02-16 DIAGNOSIS — Z7982 Long term (current) use of aspirin: Secondary | ICD-10-CM | POA: Diagnosis not present

## 2019-02-16 DIAGNOSIS — J9612 Chronic respiratory failure with hypercapnia: Secondary | ICD-10-CM | POA: Diagnosis not present

## 2019-02-16 DIAGNOSIS — E46 Unspecified protein-calorie malnutrition: Secondary | ICD-10-CM | POA: Diagnosis not present

## 2019-02-16 DIAGNOSIS — I110 Hypertensive heart disease with heart failure: Secondary | ICD-10-CM | POA: Diagnosis not present

## 2019-02-16 DIAGNOSIS — E7800 Pure hypercholesterolemia, unspecified: Secondary | ICD-10-CM | POA: Diagnosis not present

## 2019-02-16 DIAGNOSIS — R911 Solitary pulmonary nodule: Secondary | ICD-10-CM | POA: Diagnosis not present

## 2019-02-16 DIAGNOSIS — M109 Gout, unspecified: Secondary | ICD-10-CM | POA: Diagnosis not present

## 2019-02-16 DIAGNOSIS — J449 Chronic obstructive pulmonary disease, unspecified: Secondary | ICD-10-CM | POA: Diagnosis not present

## 2019-02-16 DIAGNOSIS — M159 Polyosteoarthritis, unspecified: Secondary | ICD-10-CM | POA: Diagnosis not present

## 2019-02-16 DIAGNOSIS — E1142 Type 2 diabetes mellitus with diabetic polyneuropathy: Secondary | ICD-10-CM | POA: Diagnosis not present

## 2019-02-16 DIAGNOSIS — K219 Gastro-esophageal reflux disease without esophagitis: Secondary | ICD-10-CM | POA: Diagnosis not present

## 2019-02-16 DIAGNOSIS — Z9981 Dependence on supplemental oxygen: Secondary | ICD-10-CM | POA: Diagnosis not present

## 2019-02-16 DIAGNOSIS — E785 Hyperlipidemia, unspecified: Secondary | ICD-10-CM | POA: Diagnosis not present

## 2019-02-16 NOTE — Patient Outreach (Signed)
[place] [facility]) Care Management [place] Telephone Outreach Transition of Care day # 10  02/16/2019  Cassandra Barnes 06/15/1945 [id number]  Successful telephone outreach to Cassandra Barnes, 73 y/o female with recent hospitalization at [facility] December 13, 2018 for AMS/ sepsis/ hypoxic respiratory failure related to pneumonia/ UTI; patient was discharged from hospital to SNF for rehabilitation and was apparently recently discharged from SNF back home to self care with home health services in place.  Review of EMR indicated that patient has history including, but not limited to, COPD, on home O2; OSA with CPAP; DM; and morbid obesity.  HIPAA/ identity verified with patient during phone call today.  Today, patient reports that she "is doing fine," and she reports pain in her lower extremities at "5/10," stating that she has been sitting too long in the same position while talking to her sister; states that she "only needs to change positions" and "the pain will go away." Denies chronic pain and denies that she has chronic pain.  Denies new/ recent falls.  Patient sounds to be in no distress throughout entirety of phone call today.  Patient further reports: -- has all medications and takes as prescribed; caregiver/ daughter continues managing patient's medications and provides to patient at time of scheduled doses.  Patient again reports that her caregiver/ daughter is not available to review medications.  Patient assures me that she "has all of them" and she denies concerns/ questions/ issues around her medications.  Discussed importance of prompt medication review post- recent SNF discharge, and we agreed that medication review be completed at time of next telephone outreach if her daughter is available. -- home health services continue and are "going fine." Reports RN and bath aide visit regularly; stated that home health PT has signed off "for now" until she resumes  weight bearing, at which time PT services should resume.  Recent home health visits were reviewed with patient; reports that home health RN monitors her foot wound and changes her dressings with each visit -- has attended scheduled provider appointments to the wound center on 02/13/19 for treatment of foot wound; reports plans to attend PCP office visit scheduled for tomorrow using established community resource for transportation -- continues to deny need for community resources --again denies issues with breathing status post SNF discharge; states she continues using home O2 at 2 L/min as per baseline; again reports she is not using CPAP since SNF discharge, stating that since she did not use while at SNF she "doesn't see the point."   ---- states "man from [place] coming today to check home O2;" states this is a maintenance check, and confirms that her home O2 delivery system "seems to be working fine" ---- denies issues around breathing status post- SNF discharge; discussed with patient signs/ symptoms to report to care providers immediately, indicating yellow COPD zone; reports has had COPD and used home O2 for many years and "understands" education provided today ---- unable to perform daily weights at home due to inability to ambulate/ bear weight due to foot wound and morbid obesity; this is patient's baseline and she remains essentially bed/ chair bound and requires total assistance with all ADL's, which is provided by her primary caregiver/ daughter  Patient states she is "doing fine" and would like to get off phone to reposition herself so her leg discomfort will "go away."  She denies further issues, concerns, or problems today. I confirmed that patient hasmy direct phone number, the  main [facility] CM office phone number, and the [facility] CM 24-hour nurse advice phone number should issues arise prior to next scheduled [place] outreach by phone next week, for hopeful completion of post- SNF  medication review with caregiver.  Encouraged patient or caregiver to contact me directly if needs, questions, issues, or concerns arise prior to next scheduled outreach; patient agreed to do so.  Plan:  Patient will take medications as prescribed and will attend all scheduled provider appointments  Patient will promptly notify care providers for any new concerns/ issues/ problems that arise  Patient will actively participate in home health services as ordered post-hospital discharge  [place] outreach for ongoing transition of care to continue with scheduled phone call next week for post- SNF discharge medication review with caregiver  [facility] CM Care Plan Problem One     Most Recent Value  Care Plan Problem One  High risk for hospital readmission related to/ as evidenced by recent hospital and subsequent SNF visit for sepsis and acute respiratory failure  Role Documenting the Problem One  Care Management Coordinator  Care Plan for Problem One  Active  [facility] Long Term Goal   Over the next 31 days, patient will not experience hospital readmission, as evidenced by patient reporting and review of EMR during [facility] RN CM outreach  [facility] Long Term Goal Start Date  02/07/19  Interventions for Problem One Long Term Goal  Discussed with patient her current clinical condition and confirmed that does not have current clinical concerns,  discussed need to contact care providers promptly for any new concerns/ issues/ problems that arise and confirmed that patient has contact information for care providers  [facility] CM Short Term Goal #1   Over the next 30 days, patient will actively participate in home health services as evidenced by patient reporting and collaboration with home health team as indicated during [facility] RN CM outreach  [facility] CM Short Term Goal #1 Start Date  02/07/19  Interventions for Short Term Goal #1  Confirmed that home health team continues visiting patient and encouraged her ongoing active  particpation in home health services/ disciplines,  confirmed that patient has contact information for home health team and reviewed recent home visits with patient   [facility] CM Short Term Goal #2   Over the next 30 days, patient will attend all scheduled provider appointments as evidenced by patient reporting and collaboration with care providers as indicated during [facility] RN CM outreach  [facility] CM Short Term Goal #2 Start Date  02/07/19  Interventions for Short Term Goal #2  Reviewed [place] and upcoming scheduled provider visits and confirmed that patient is aware of appointments and plans to attend all,  confirmed that patient plans to continue using established transportation services to and from provider visits     Oneta Rack, RN, BSN, [employer] Coordinator [facility] Care Management  [phone]

## 2019-02-17 DIAGNOSIS — E118 Type 2 diabetes mellitus with unspecified complications: Secondary | ICD-10-CM | POA: Diagnosis not present

## 2019-02-17 DIAGNOSIS — L89610 Pressure ulcer of right heel, unstageable: Secondary | ICD-10-CM | POA: Diagnosis not present

## 2019-02-17 DIAGNOSIS — I110 Hypertensive heart disease with heart failure: Secondary | ICD-10-CM | POA: Diagnosis not present

## 2019-02-17 DIAGNOSIS — E1151 Type 2 diabetes mellitus with diabetic peripheral angiopathy without gangrene: Secondary | ICD-10-CM | POA: Diagnosis not present

## 2019-02-17 DIAGNOSIS — R911 Solitary pulmonary nodule: Secondary | ICD-10-CM | POA: Diagnosis not present

## 2019-02-17 DIAGNOSIS — E7800 Pure hypercholesterolemia, unspecified: Secondary | ICD-10-CM | POA: Diagnosis not present

## 2019-02-17 DIAGNOSIS — I10 Essential (primary) hypertension: Secondary | ICD-10-CM | POA: Diagnosis not present

## 2019-02-17 DIAGNOSIS — D509 Iron deficiency anemia, unspecified: Secondary | ICD-10-CM | POA: Diagnosis not present

## 2019-02-17 DIAGNOSIS — K219 Gastro-esophageal reflux disease without esophagitis: Secondary | ICD-10-CM | POA: Diagnosis not present

## 2019-02-17 DIAGNOSIS — Z794 Long term (current) use of insulin: Secondary | ICD-10-CM | POA: Diagnosis not present

## 2019-02-17 DIAGNOSIS — E1142 Type 2 diabetes mellitus with diabetic polyneuropathy: Secondary | ICD-10-CM | POA: Diagnosis not present

## 2019-02-17 DIAGNOSIS — Z87440 Personal history of urinary (tract) infections: Secondary | ICD-10-CM | POA: Diagnosis not present

## 2019-02-17 DIAGNOSIS — Z7982 Long term (current) use of aspirin: Secondary | ICD-10-CM | POA: Diagnosis not present

## 2019-02-17 DIAGNOSIS — M109 Gout, unspecified: Secondary | ICD-10-CM | POA: Diagnosis not present

## 2019-02-17 DIAGNOSIS — Z9981 Dependence on supplemental oxygen: Secondary | ICD-10-CM | POA: Diagnosis not present

## 2019-02-17 DIAGNOSIS — M8580 Other specified disorders of bone density and structure, unspecified site: Secondary | ICD-10-CM | POA: Diagnosis not present

## 2019-02-17 DIAGNOSIS — J9611 Chronic respiratory failure with hypoxia: Secondary | ICD-10-CM | POA: Diagnosis not present

## 2019-02-17 DIAGNOSIS — E1343 Other specified diabetes mellitus with diabetic autonomic (poly)neuropathy: Secondary | ICD-10-CM | POA: Diagnosis not present

## 2019-02-17 DIAGNOSIS — J9612 Chronic respiratory failure with hypercapnia: Secondary | ICD-10-CM | POA: Diagnosis not present

## 2019-02-17 DIAGNOSIS — G4733 Obstructive sleep apnea (adult) (pediatric): Secondary | ICD-10-CM | POA: Diagnosis not present

## 2019-02-17 DIAGNOSIS — I4510 Unspecified right bundle-branch block: Secondary | ICD-10-CM | POA: Diagnosis not present

## 2019-02-17 DIAGNOSIS — J449 Chronic obstructive pulmonary disease, unspecified: Secondary | ICD-10-CM | POA: Diagnosis not present

## 2019-02-17 DIAGNOSIS — I5032 Chronic diastolic (congestive) heart failure: Secondary | ICD-10-CM | POA: Diagnosis not present

## 2019-02-17 DIAGNOSIS — K760 Fatty (change of) liver, not elsewhere classified: Secondary | ICD-10-CM | POA: Diagnosis not present

## 2019-02-17 DIAGNOSIS — E785 Hyperlipidemia, unspecified: Secondary | ICD-10-CM | POA: Diagnosis not present

## 2019-02-17 DIAGNOSIS — E46 Unspecified protein-calorie malnutrition: Secondary | ICD-10-CM | POA: Diagnosis not present

## 2019-02-17 DIAGNOSIS — M159 Polyosteoarthritis, unspecified: Secondary | ICD-10-CM | POA: Diagnosis not present

## 2019-02-17 DIAGNOSIS — D649 Anemia, unspecified: Secondary | ICD-10-CM | POA: Diagnosis not present

## 2019-02-17 DIAGNOSIS — R339 Retention of urine, unspecified: Secondary | ICD-10-CM | POA: Diagnosis not present

## 2019-02-20 DIAGNOSIS — E785 Hyperlipidemia, unspecified: Secondary | ICD-10-CM | POA: Diagnosis not present

## 2019-02-20 DIAGNOSIS — Z87440 Personal history of urinary (tract) infections: Secondary | ICD-10-CM | POA: Diagnosis not present

## 2019-02-20 DIAGNOSIS — L89610 Pressure ulcer of right heel, unstageable: Secondary | ICD-10-CM | POA: Diagnosis not present

## 2019-02-20 DIAGNOSIS — R911 Solitary pulmonary nodule: Secondary | ICD-10-CM | POA: Diagnosis not present

## 2019-02-20 DIAGNOSIS — K760 Fatty (change of) liver, not elsewhere classified: Secondary | ICD-10-CM | POA: Diagnosis not present

## 2019-02-20 DIAGNOSIS — I5032 Chronic diastolic (congestive) heart failure: Secondary | ICD-10-CM | POA: Diagnosis not present

## 2019-02-20 DIAGNOSIS — Z794 Long term (current) use of insulin: Secondary | ICD-10-CM | POA: Diagnosis not present

## 2019-02-20 DIAGNOSIS — E7800 Pure hypercholesterolemia, unspecified: Secondary | ICD-10-CM | POA: Diagnosis not present

## 2019-02-20 DIAGNOSIS — G4733 Obstructive sleep apnea (adult) (pediatric): Secondary | ICD-10-CM | POA: Diagnosis not present

## 2019-02-20 DIAGNOSIS — E1151 Type 2 diabetes mellitus with diabetic peripheral angiopathy without gangrene: Secondary | ICD-10-CM | POA: Diagnosis not present

## 2019-02-20 DIAGNOSIS — J9611 Chronic respiratory failure with hypoxia: Secondary | ICD-10-CM | POA: Diagnosis not present

## 2019-02-20 DIAGNOSIS — M159 Polyosteoarthritis, unspecified: Secondary | ICD-10-CM | POA: Diagnosis not present

## 2019-02-20 DIAGNOSIS — I110 Hypertensive heart disease with heart failure: Secondary | ICD-10-CM | POA: Diagnosis not present

## 2019-02-20 DIAGNOSIS — Z9981 Dependence on supplemental oxygen: Secondary | ICD-10-CM | POA: Diagnosis not present

## 2019-02-20 DIAGNOSIS — E1142 Type 2 diabetes mellitus with diabetic polyneuropathy: Secondary | ICD-10-CM | POA: Diagnosis not present

## 2019-02-20 DIAGNOSIS — E46 Unspecified protein-calorie malnutrition: Secondary | ICD-10-CM | POA: Diagnosis not present

## 2019-02-20 DIAGNOSIS — D509 Iron deficiency anemia, unspecified: Secondary | ICD-10-CM | POA: Diagnosis not present

## 2019-02-20 DIAGNOSIS — Z7982 Long term (current) use of aspirin: Secondary | ICD-10-CM | POA: Diagnosis not present

## 2019-02-20 DIAGNOSIS — K219 Gastro-esophageal reflux disease without esophagitis: Secondary | ICD-10-CM | POA: Diagnosis not present

## 2019-02-20 DIAGNOSIS — M8580 Other specified disorders of bone density and structure, unspecified site: Secondary | ICD-10-CM | POA: Diagnosis not present

## 2019-02-20 DIAGNOSIS — I4510 Unspecified right bundle-branch block: Secondary | ICD-10-CM | POA: Diagnosis not present

## 2019-02-20 DIAGNOSIS — J9612 Chronic respiratory failure with hypercapnia: Secondary | ICD-10-CM | POA: Diagnosis not present

## 2019-02-20 DIAGNOSIS — M109 Gout, unspecified: Secondary | ICD-10-CM | POA: Diagnosis not present

## 2019-02-20 DIAGNOSIS — J449 Chronic obstructive pulmonary disease, unspecified: Secondary | ICD-10-CM | POA: Diagnosis not present

## 2019-02-20 DIAGNOSIS — R339 Retention of urine, unspecified: Secondary | ICD-10-CM | POA: Diagnosis not present

## 2019-02-21 DIAGNOSIS — K219 Gastro-esophageal reflux disease without esophagitis: Secondary | ICD-10-CM | POA: Diagnosis not present

## 2019-02-21 DIAGNOSIS — E1142 Type 2 diabetes mellitus with diabetic polyneuropathy: Secondary | ICD-10-CM | POA: Diagnosis not present

## 2019-02-21 DIAGNOSIS — J9612 Chronic respiratory failure with hypercapnia: Secondary | ICD-10-CM | POA: Diagnosis not present

## 2019-02-21 DIAGNOSIS — R911 Solitary pulmonary nodule: Secondary | ICD-10-CM | POA: Diagnosis not present

## 2019-02-21 DIAGNOSIS — L89610 Pressure ulcer of right heel, unstageable: Secondary | ICD-10-CM | POA: Diagnosis not present

## 2019-02-21 DIAGNOSIS — Z9981 Dependence on supplemental oxygen: Secondary | ICD-10-CM | POA: Diagnosis not present

## 2019-02-21 DIAGNOSIS — Z794 Long term (current) use of insulin: Secondary | ICD-10-CM | POA: Diagnosis not present

## 2019-02-21 DIAGNOSIS — D509 Iron deficiency anemia, unspecified: Secondary | ICD-10-CM | POA: Diagnosis not present

## 2019-02-21 DIAGNOSIS — J449 Chronic obstructive pulmonary disease, unspecified: Secondary | ICD-10-CM | POA: Diagnosis not present

## 2019-02-21 DIAGNOSIS — I4510 Unspecified right bundle-branch block: Secondary | ICD-10-CM | POA: Diagnosis not present

## 2019-02-21 DIAGNOSIS — E46 Unspecified protein-calorie malnutrition: Secondary | ICD-10-CM | POA: Diagnosis not present

## 2019-02-21 DIAGNOSIS — Z87440 Personal history of urinary (tract) infections: Secondary | ICD-10-CM | POA: Diagnosis not present

## 2019-02-21 DIAGNOSIS — K760 Fatty (change of) liver, not elsewhere classified: Secondary | ICD-10-CM | POA: Diagnosis not present

## 2019-02-21 DIAGNOSIS — E785 Hyperlipidemia, unspecified: Secondary | ICD-10-CM | POA: Diagnosis not present

## 2019-02-21 DIAGNOSIS — E7800 Pure hypercholesterolemia, unspecified: Secondary | ICD-10-CM | POA: Diagnosis not present

## 2019-02-21 DIAGNOSIS — I5032 Chronic diastolic (congestive) heart failure: Secondary | ICD-10-CM | POA: Diagnosis not present

## 2019-02-21 DIAGNOSIS — M109 Gout, unspecified: Secondary | ICD-10-CM | POA: Diagnosis not present

## 2019-02-21 DIAGNOSIS — M8580 Other specified disorders of bone density and structure, unspecified site: Secondary | ICD-10-CM | POA: Diagnosis not present

## 2019-02-21 DIAGNOSIS — I110 Hypertensive heart disease with heart failure: Secondary | ICD-10-CM | POA: Diagnosis not present

## 2019-02-21 DIAGNOSIS — M159 Polyosteoarthritis, unspecified: Secondary | ICD-10-CM | POA: Diagnosis not present

## 2019-02-21 DIAGNOSIS — G4733 Obstructive sleep apnea (adult) (pediatric): Secondary | ICD-10-CM | POA: Diagnosis not present

## 2019-02-21 DIAGNOSIS — J9611 Chronic respiratory failure with hypoxia: Secondary | ICD-10-CM | POA: Diagnosis not present

## 2019-02-21 DIAGNOSIS — Z7982 Long term (current) use of aspirin: Secondary | ICD-10-CM | POA: Diagnosis not present

## 2019-02-21 DIAGNOSIS — R339 Retention of urine, unspecified: Secondary | ICD-10-CM | POA: Diagnosis not present

## 2019-02-21 DIAGNOSIS — E1151 Type 2 diabetes mellitus with diabetic peripheral angiopathy without gangrene: Secondary | ICD-10-CM | POA: Diagnosis not present

## 2019-02-22 DIAGNOSIS — R339 Retention of urine, unspecified: Secondary | ICD-10-CM | POA: Diagnosis not present

## 2019-02-22 DIAGNOSIS — I4510 Unspecified right bundle-branch block: Secondary | ICD-10-CM | POA: Diagnosis not present

## 2019-02-22 DIAGNOSIS — E785 Hyperlipidemia, unspecified: Secondary | ICD-10-CM | POA: Diagnosis not present

## 2019-02-22 DIAGNOSIS — Z9981 Dependence on supplemental oxygen: Secondary | ICD-10-CM | POA: Diagnosis not present

## 2019-02-22 DIAGNOSIS — J9612 Chronic respiratory failure with hypercapnia: Secondary | ICD-10-CM | POA: Diagnosis not present

## 2019-02-22 DIAGNOSIS — M109 Gout, unspecified: Secondary | ICD-10-CM | POA: Diagnosis not present

## 2019-02-22 DIAGNOSIS — R911 Solitary pulmonary nodule: Secondary | ICD-10-CM | POA: Diagnosis not present

## 2019-02-22 DIAGNOSIS — I5032 Chronic diastolic (congestive) heart failure: Secondary | ICD-10-CM | POA: Diagnosis not present

## 2019-02-22 DIAGNOSIS — E46 Unspecified protein-calorie malnutrition: Secondary | ICD-10-CM | POA: Diagnosis not present

## 2019-02-22 DIAGNOSIS — L89610 Pressure ulcer of right heel, unstageable: Secondary | ICD-10-CM | POA: Diagnosis not present

## 2019-02-22 DIAGNOSIS — K219 Gastro-esophageal reflux disease without esophagitis: Secondary | ICD-10-CM | POA: Diagnosis not present

## 2019-02-22 DIAGNOSIS — G4733 Obstructive sleep apnea (adult) (pediatric): Secondary | ICD-10-CM | POA: Diagnosis not present

## 2019-02-22 DIAGNOSIS — M159 Polyosteoarthritis, unspecified: Secondary | ICD-10-CM | POA: Diagnosis not present

## 2019-02-22 DIAGNOSIS — D509 Iron deficiency anemia, unspecified: Secondary | ICD-10-CM | POA: Diagnosis not present

## 2019-02-22 DIAGNOSIS — K760 Fatty (change of) liver, not elsewhere classified: Secondary | ICD-10-CM | POA: Diagnosis not present

## 2019-02-22 DIAGNOSIS — Z7982 Long term (current) use of aspirin: Secondary | ICD-10-CM | POA: Diagnosis not present

## 2019-02-22 DIAGNOSIS — E7800 Pure hypercholesterolemia, unspecified: Secondary | ICD-10-CM | POA: Diagnosis not present

## 2019-02-22 DIAGNOSIS — Z794 Long term (current) use of insulin: Secondary | ICD-10-CM | POA: Diagnosis not present

## 2019-02-22 DIAGNOSIS — J449 Chronic obstructive pulmonary disease, unspecified: Secondary | ICD-10-CM | POA: Diagnosis not present

## 2019-02-22 DIAGNOSIS — E1151 Type 2 diabetes mellitus with diabetic peripheral angiopathy without gangrene: Secondary | ICD-10-CM | POA: Diagnosis not present

## 2019-02-22 DIAGNOSIS — Z87440 Personal history of urinary (tract) infections: Secondary | ICD-10-CM | POA: Diagnosis not present

## 2019-02-22 DIAGNOSIS — E1142 Type 2 diabetes mellitus with diabetic polyneuropathy: Secondary | ICD-10-CM | POA: Diagnosis not present

## 2019-02-22 DIAGNOSIS — J9611 Chronic respiratory failure with hypoxia: Secondary | ICD-10-CM | POA: Diagnosis not present

## 2019-02-22 DIAGNOSIS — I110 Hypertensive heart disease with heart failure: Secondary | ICD-10-CM | POA: Diagnosis not present

## 2019-02-22 DIAGNOSIS — M8580 Other specified disorders of bone density and structure, unspecified site: Secondary | ICD-10-CM | POA: Diagnosis not present

## 2019-02-23 ENCOUNTER — Encounter: Payer: Self-pay | Admitting: *Deleted

## 2019-02-23 ENCOUNTER — Other Ambulatory Visit: Payer: Self-pay | Admitting: *Deleted

## 2019-02-23 NOTE — Patient Outreach (Signed)
Cassandra Barnes) Care Management [place] Telephone Outreach, Transition of Care day # 17 02/23/2019  Cassandra Barnes 1945-09-27 [id number]  Successful telephone outreach to Cassandra Barnes, 73 y/o female with recent hospitalization at [facility] December 13, 2018 for AMS/ sepsis/ hypoxic respiratory failure related to pneumonia/ UTI; patient was discharged from hospital to SNF for rehabilitation and was apparently recently discharged from SNF back home to self carewith home health services in place. Review of EMR indicated that patient has history including, but not limited to, COPD, on home O2; OSA with CPAP; DM; and morbid obesity.HIPAA/ identity verified with patient during phone call today.  Today, patient reports that she "is doing fine," and she denies pain/ new/ recent falls.  Patient states "verything is going just fine" and she sounds to be in no apparent distress throughout entirety of phone call.  Patient further reports: -- has all medicationsand takes as prescribed; caregiver/ daughter is available for medication review today, and this was completed with patient's daughter/ caregiver upon patient request.  Medication review completed and medication list in EMR updated accordingly.  Daughter denies concerns/ issues/ problems around medications and confirms that she continues managing all of patient's medications and uses pill box.  Medications Reviewed Today    Reviewed by Knox Royalty, RN (Registered Nurse) on 02/23/19 at 1233  Med List Status: <None>  Medication Order Taking? Sig Documenting Provider Last Dose Status Informant  aspirin EC 81 MG tablet [id number]  Take 81 mg by mouth daily. [provider]  Active   colchicine 0.6 MG tablet [id number]   [provider]  Active   Febuxostat (ULORIC) 80 MG TABS [id number]   [provider]  Active   furosemide (LASIX) 80 MG tablet [id number]   [provider]  Active    gabapentin (NEURONTIN) 600 MG tablet [id number]  Take 600 mg by mouth 3 (three) times daily. [provider]  Active   metFORMIN (GLUCOPHAGE) 1000 MG tablet [id number]  TAKE 1 TABLET BY MOUTH TWICE DAILY [provider]  Active   omeprazole (PRILOSEC) 20 MG capsule [id number]  Take 20 mg by mouth daily. [provider]  Active   promethazine (PHENERGAN) 25 MG tablet [id number]  Take 25 mg by mouth every 6 (six) hours as needed for nausea or vomiting. [provider]  Active   spironolactone (ALDACTONE) 25 MG tablet [id number]   [provider]  Active          -- home health services continue and are "going fine." Reports RN and bath aide continue to visit regularly; verbalizes ongoing plans to continue actively participating in home health services; reports home health RN changes patient's (R) foot dressings "at least twice a week;" reports that nurse has told her wound "looks fine." -- attended scheduled PCP provider appointment 02/17/2019; "got a good report; he went over all the medications with [place]."  Verbalizes accurate understanding of upcoming provider appointment 02/27/2019 to wound clinic stating she plans to attend using established transportation services --again denies issues with breathing status post SNF discharge; states she continues using home O2 at 2 L/min as per baseline; still not using CPAP since SNF discharge, stating that since she did not use while at SNF she "doesn't see the point."   ---- confirms "man from [place]" came "last week" and checked home oxygenator; states "everything checked just fine, no problems." ---- continues to deny issues around breathing status post- SNF discharge; discussed with patient  signs/ symptoms to report to care providers immediately, indicating yellow COPD zone;and she verbalizes agreement ---- acknowledges today that she is "sometimes short of breath" at rest; states she rests and occasionally  turns home oxygen up to "4 L/min" temporarily; reports these episodes "do not happen very often" and that rest/ temporary increase home O2 works to resolve shortness of breath within "just a few minutes."  Patient states she "always remembers" to turn home O2 back down to 2 L/min when she experiences these episodes -- daughter monitors and records blood sugars 3 times per day and provides long and short acting insulins as prescribed; daughter stated that her blood sugars "vary" and "are sometimes on the high side," although daughter does not provide specific quantifiers, stating, "it just depends;" reports post-prandial blood sugar this morning of "171;" encouraged daughter to continue giving long and short/ sliding scale insulin according to PCP instructions and she verbalizes agreement -- daughter confirms that she continues monitoring patient's skin and changing patient's "diapers" "whenever she needs it;" daughter states "skin looks good and I use the nystatin powder," and she denies concerns around patient's skin integrity.  Discussed with daughter importance of maintaining intact skin integrity and she stated she changes patient "whenever she needs it," and has no concerns at this time  Patient and caregiver/ daughter both deny further issues, concerns, or problems today. Iconfirmed that patienthasmy direct phone number, the main [facility] CM office phone number, and the [facility] CM 24-hour nurse advice phone number should issues arise prior to next scheduled [place] outreachby phone next week. Encouraged patient or caregiverto contact me directly if needs, questions, issues, or concerns arise prior to next scheduled outreach; both agreed to do so.  Plan:  Patient will take medications as prescribed and will attend all scheduled provider appointments  Patient will promptly notify care providers for any new concerns/ issues/ problems that arise  Patient will actively participate in home  health services as ordered post-hospital discharge  I will share today's [facility] RN CM notes/ care plan/ and updated medication list with patient's PCP as [facility] CM initial assessment  [facility] CM outreachfor ongoing transition of careto continue with scheduled phone call next week   [facility] CM Care Plan Problem One     Most Recent Value  Care Plan Problem One  High risk for hospital readmission related to/ as evidenced by recent hospital and subsequent SNF visit for sepsis and acute respiratory failure  Role Documenting the Problem One  Care Management Coordinator  Care Plan for Problem One  Active  [facility] Long Term Goal   Over the next 31 days, patient will not experience hospital readmission, as evidenced by patient reporting and review of EMR during [facility] RN CM outreach  [facility] Long Term Goal Start Date  02/07/19  Interventions for Problem One Long Term Goal  Discussed with patient her current clinical condition and confirmed that she has no current clinical concerns,  engaged patient's daughter who also reports no current patient clinical concerns,  post- SNF discharge medication review completed with caregiver/ daughter,  medication list in EMR updated accordingly.  [facility] RN CM Initial assessment with medication list shared with patient's PCP  [facility] CM Short Term Goal #1   Over the next 30 days, patient will actively participate in home health services as evidenced by patient reporting and collaboration with home health team as indicated during [facility] RN CM outreach  [facility] CM Short Term Goal #1 Start Date  02/07/19  Interventions for Short Term Goal #1  Confirmed that patient continues actively participating in home health services and that home health RN visits patient twice weekly for (R) foot dressing changes and general assessment,  encouraged patient's ongoing active participation in home health services   [facility] CM Short Term Goal #2   Over the next 30 days, patient will attend all scheduled provider  appointments as evidenced by patient reporting and collaboration with care providers as indicated during [facility] RN CM outreach  [facility] CM Short Term Goal #2 Start Date  02/07/19  Interventions for Short Term Goal #2  Reviewed recent and upcoming provider appointments with patient and daughter and confirmed that patient has attended all as scheduled post- SNF discharge,  confirmed that patient continues using established transportation resources and plans to continue doing so     Oneta Rack, Therapist, sports, BSN, [employer] [facility] Care Management  [phone]

## 2019-02-24 DIAGNOSIS — G4733 Obstructive sleep apnea (adult) (pediatric): Secondary | ICD-10-CM | POA: Diagnosis not present

## 2019-02-24 DIAGNOSIS — J9612 Chronic respiratory failure with hypercapnia: Secondary | ICD-10-CM | POA: Diagnosis not present

## 2019-02-24 DIAGNOSIS — Z9981 Dependence on supplemental oxygen: Secondary | ICD-10-CM | POA: Diagnosis not present

## 2019-02-24 DIAGNOSIS — J449 Chronic obstructive pulmonary disease, unspecified: Secondary | ICD-10-CM | POA: Diagnosis not present

## 2019-02-24 DIAGNOSIS — R339 Retention of urine, unspecified: Secondary | ICD-10-CM | POA: Diagnosis not present

## 2019-02-24 DIAGNOSIS — I5032 Chronic diastolic (congestive) heart failure: Secondary | ICD-10-CM | POA: Diagnosis not present

## 2019-02-24 DIAGNOSIS — Z794 Long term (current) use of insulin: Secondary | ICD-10-CM | POA: Diagnosis not present

## 2019-02-24 DIAGNOSIS — Z7982 Long term (current) use of aspirin: Secondary | ICD-10-CM | POA: Diagnosis not present

## 2019-02-24 DIAGNOSIS — K219 Gastro-esophageal reflux disease without esophagitis: Secondary | ICD-10-CM | POA: Diagnosis not present

## 2019-02-24 DIAGNOSIS — J9611 Chronic respiratory failure with hypoxia: Secondary | ICD-10-CM | POA: Diagnosis not present

## 2019-02-24 DIAGNOSIS — M8580 Other specified disorders of bone density and structure, unspecified site: Secondary | ICD-10-CM | POA: Diagnosis not present

## 2019-02-24 DIAGNOSIS — E1142 Type 2 diabetes mellitus with diabetic polyneuropathy: Secondary | ICD-10-CM | POA: Diagnosis not present

## 2019-02-24 DIAGNOSIS — K760 Fatty (change of) liver, not elsewhere classified: Secondary | ICD-10-CM | POA: Diagnosis not present

## 2019-02-24 DIAGNOSIS — M159 Polyosteoarthritis, unspecified: Secondary | ICD-10-CM | POA: Diagnosis not present

## 2019-02-24 DIAGNOSIS — D509 Iron deficiency anemia, unspecified: Secondary | ICD-10-CM | POA: Diagnosis not present

## 2019-02-24 DIAGNOSIS — E7800 Pure hypercholesterolemia, unspecified: Secondary | ICD-10-CM | POA: Diagnosis not present

## 2019-02-24 DIAGNOSIS — E1151 Type 2 diabetes mellitus with diabetic peripheral angiopathy without gangrene: Secondary | ICD-10-CM | POA: Diagnosis not present

## 2019-02-24 DIAGNOSIS — R911 Solitary pulmonary nodule: Secondary | ICD-10-CM | POA: Diagnosis not present

## 2019-02-24 DIAGNOSIS — L89610 Pressure ulcer of right heel, unstageable: Secondary | ICD-10-CM | POA: Diagnosis not present

## 2019-02-24 DIAGNOSIS — I110 Hypertensive heart disease with heart failure: Secondary | ICD-10-CM | POA: Diagnosis not present

## 2019-02-24 DIAGNOSIS — M109 Gout, unspecified: Secondary | ICD-10-CM | POA: Diagnosis not present

## 2019-02-24 DIAGNOSIS — Z87440 Personal history of urinary (tract) infections: Secondary | ICD-10-CM | POA: Diagnosis not present

## 2019-02-24 DIAGNOSIS — E785 Hyperlipidemia, unspecified: Secondary | ICD-10-CM | POA: Diagnosis not present

## 2019-02-24 DIAGNOSIS — E46 Unspecified protein-calorie malnutrition: Secondary | ICD-10-CM | POA: Diagnosis not present

## 2019-02-24 DIAGNOSIS — I4510 Unspecified right bundle-branch block: Secondary | ICD-10-CM | POA: Diagnosis not present

## 2019-02-25 DIAGNOSIS — J9621 Acute and chronic respiratory failure with hypoxia: Secondary | ICD-10-CM | POA: Diagnosis not present

## 2019-02-25 DIAGNOSIS — J449 Chronic obstructive pulmonary disease, unspecified: Secondary | ICD-10-CM | POA: Diagnosis not present

## 2019-02-27 DIAGNOSIS — L97419 Non-pressure chronic ulcer of right heel and midfoot with unspecified severity: Secondary | ICD-10-CM | POA: Diagnosis not present

## 2019-02-27 DIAGNOSIS — E11621 Type 2 diabetes mellitus with foot ulcer: Secondary | ICD-10-CM | POA: Diagnosis not present

## 2019-02-27 DIAGNOSIS — I10 Essential (primary) hypertension: Secondary | ICD-10-CM | POA: Diagnosis not present

## 2019-02-27 DIAGNOSIS — L89613 Pressure ulcer of right heel, stage 3: Secondary | ICD-10-CM | POA: Diagnosis not present

## 2019-02-28 DIAGNOSIS — E1142 Type 2 diabetes mellitus with diabetic polyneuropathy: Secondary | ICD-10-CM | POA: Diagnosis not present

## 2019-02-28 DIAGNOSIS — G4733 Obstructive sleep apnea (adult) (pediatric): Secondary | ICD-10-CM | POA: Diagnosis not present

## 2019-02-28 DIAGNOSIS — R911 Solitary pulmonary nodule: Secondary | ICD-10-CM | POA: Diagnosis not present

## 2019-02-28 DIAGNOSIS — R339 Retention of urine, unspecified: Secondary | ICD-10-CM | POA: Diagnosis not present

## 2019-02-28 DIAGNOSIS — J9612 Chronic respiratory failure with hypercapnia: Secondary | ICD-10-CM | POA: Diagnosis not present

## 2019-02-28 DIAGNOSIS — J9611 Chronic respiratory failure with hypoxia: Secondary | ICD-10-CM | POA: Diagnosis not present

## 2019-02-28 DIAGNOSIS — D509 Iron deficiency anemia, unspecified: Secondary | ICD-10-CM | POA: Diagnosis not present

## 2019-02-28 DIAGNOSIS — Z7982 Long term (current) use of aspirin: Secondary | ICD-10-CM | POA: Diagnosis not present

## 2019-02-28 DIAGNOSIS — J449 Chronic obstructive pulmonary disease, unspecified: Secondary | ICD-10-CM | POA: Diagnosis not present

## 2019-02-28 DIAGNOSIS — Z87440 Personal history of urinary (tract) infections: Secondary | ICD-10-CM | POA: Diagnosis not present

## 2019-02-28 DIAGNOSIS — I5032 Chronic diastolic (congestive) heart failure: Secondary | ICD-10-CM | POA: Diagnosis not present

## 2019-02-28 DIAGNOSIS — K760 Fatty (change of) liver, not elsewhere classified: Secondary | ICD-10-CM | POA: Diagnosis not present

## 2019-02-28 DIAGNOSIS — L89610 Pressure ulcer of right heel, unstageable: Secondary | ICD-10-CM | POA: Diagnosis not present

## 2019-02-28 DIAGNOSIS — E785 Hyperlipidemia, unspecified: Secondary | ICD-10-CM | POA: Diagnosis not present

## 2019-02-28 DIAGNOSIS — M8580 Other specified disorders of bone density and structure, unspecified site: Secondary | ICD-10-CM | POA: Diagnosis not present

## 2019-02-28 DIAGNOSIS — Z9981 Dependence on supplemental oxygen: Secondary | ICD-10-CM | POA: Diagnosis not present

## 2019-02-28 DIAGNOSIS — E7800 Pure hypercholesterolemia, unspecified: Secondary | ICD-10-CM | POA: Diagnosis not present

## 2019-02-28 DIAGNOSIS — I110 Hypertensive heart disease with heart failure: Secondary | ICD-10-CM | POA: Diagnosis not present

## 2019-02-28 DIAGNOSIS — Z794 Long term (current) use of insulin: Secondary | ICD-10-CM | POA: Diagnosis not present

## 2019-02-28 DIAGNOSIS — M159 Polyosteoarthritis, unspecified: Secondary | ICD-10-CM | POA: Diagnosis not present

## 2019-02-28 DIAGNOSIS — E1151 Type 2 diabetes mellitus with diabetic peripheral angiopathy without gangrene: Secondary | ICD-10-CM | POA: Diagnosis not present

## 2019-02-28 DIAGNOSIS — E46 Unspecified protein-calorie malnutrition: Secondary | ICD-10-CM | POA: Diagnosis not present

## 2019-02-28 DIAGNOSIS — M109 Gout, unspecified: Secondary | ICD-10-CM | POA: Diagnosis not present

## 2019-02-28 DIAGNOSIS — I4510 Unspecified right bundle-branch block: Secondary | ICD-10-CM | POA: Diagnosis not present

## 2019-02-28 DIAGNOSIS — K219 Gastro-esophageal reflux disease without esophagitis: Secondary | ICD-10-CM | POA: Diagnosis not present

## 2019-03-01 ENCOUNTER — Encounter: Payer: Self-pay | Admitting: *Deleted

## 2019-03-01 ENCOUNTER — Other Ambulatory Visit: Payer: Self-pay | Admitting: *Deleted

## 2019-03-01 DIAGNOSIS — I110 Hypertensive heart disease with heart failure: Secondary | ICD-10-CM | POA: Diagnosis not present

## 2019-03-01 DIAGNOSIS — Z7982 Long term (current) use of aspirin: Secondary | ICD-10-CM | POA: Diagnosis not present

## 2019-03-01 DIAGNOSIS — E785 Hyperlipidemia, unspecified: Secondary | ICD-10-CM | POA: Diagnosis not present

## 2019-03-01 DIAGNOSIS — J9612 Chronic respiratory failure with hypercapnia: Secondary | ICD-10-CM | POA: Diagnosis not present

## 2019-03-01 DIAGNOSIS — Z87440 Personal history of urinary (tract) infections: Secondary | ICD-10-CM | POA: Diagnosis not present

## 2019-03-01 DIAGNOSIS — M109 Gout, unspecified: Secondary | ICD-10-CM | POA: Diagnosis not present

## 2019-03-01 DIAGNOSIS — J449 Chronic obstructive pulmonary disease, unspecified: Secondary | ICD-10-CM | POA: Diagnosis not present

## 2019-03-01 DIAGNOSIS — E1142 Type 2 diabetes mellitus with diabetic polyneuropathy: Secondary | ICD-10-CM | POA: Diagnosis not present

## 2019-03-01 DIAGNOSIS — K219 Gastro-esophageal reflux disease without esophagitis: Secondary | ICD-10-CM | POA: Diagnosis not present

## 2019-03-01 DIAGNOSIS — K760 Fatty (change of) liver, not elsewhere classified: Secondary | ICD-10-CM | POA: Diagnosis not present

## 2019-03-01 DIAGNOSIS — E7800 Pure hypercholesterolemia, unspecified: Secondary | ICD-10-CM | POA: Diagnosis not present

## 2019-03-01 DIAGNOSIS — E46 Unspecified protein-calorie malnutrition: Secondary | ICD-10-CM | POA: Diagnosis not present

## 2019-03-01 DIAGNOSIS — I4510 Unspecified right bundle-branch block: Secondary | ICD-10-CM | POA: Diagnosis not present

## 2019-03-01 DIAGNOSIS — M8580 Other specified disorders of bone density and structure, unspecified site: Secondary | ICD-10-CM | POA: Diagnosis not present

## 2019-03-01 DIAGNOSIS — Z9981 Dependence on supplemental oxygen: Secondary | ICD-10-CM | POA: Diagnosis not present

## 2019-03-01 DIAGNOSIS — Z794 Long term (current) use of insulin: Secondary | ICD-10-CM | POA: Diagnosis not present

## 2019-03-01 DIAGNOSIS — E1151 Type 2 diabetes mellitus with diabetic peripheral angiopathy without gangrene: Secondary | ICD-10-CM | POA: Diagnosis not present

## 2019-03-01 DIAGNOSIS — R911 Solitary pulmonary nodule: Secondary | ICD-10-CM | POA: Diagnosis not present

## 2019-03-01 DIAGNOSIS — R339 Retention of urine, unspecified: Secondary | ICD-10-CM | POA: Diagnosis not present

## 2019-03-01 DIAGNOSIS — M159 Polyosteoarthritis, unspecified: Secondary | ICD-10-CM | POA: Diagnosis not present

## 2019-03-01 DIAGNOSIS — I5032 Chronic diastolic (congestive) heart failure: Secondary | ICD-10-CM | POA: Diagnosis not present

## 2019-03-01 DIAGNOSIS — J9611 Chronic respiratory failure with hypoxia: Secondary | ICD-10-CM | POA: Diagnosis not present

## 2019-03-01 DIAGNOSIS — L89610 Pressure ulcer of right heel, unstageable: Secondary | ICD-10-CM | POA: Diagnosis not present

## 2019-03-01 DIAGNOSIS — D509 Iron deficiency anemia, unspecified: Secondary | ICD-10-CM | POA: Diagnosis not present

## 2019-03-01 DIAGNOSIS — G4733 Obstructive sleep apnea (adult) (pediatric): Secondary | ICD-10-CM | POA: Diagnosis not present

## 2019-03-01 NOTE — Patient Outreach (Signed)
[name]) Care Management [place] Telephone Outreach, Transition of Care day 23  03/01/2019  MIKAIAH Barnes 27-May-1945 [id number]  Successful telephone outreach to Cassandra Barnes, 73 y/o female with recent hospitalization at [facility] December 13, 2018 for AMS/ sepsis/ hypoxic respiratory failure related to pneumonia/ UTI; patient was discharged from hospital to SNF for rehabilitation and was apparently recently discharged from SNF back home to self carewith home health services in place. Patient has history including, but not limited to, COPD, on home O2; OSA with CPAP; DM; and morbid obesity.HIPAA/ identity verified with patient during phone call today.  Today, patient again reports that she "is doing just fine," and she denies pain/ new/ recent falls.  Patient sounds to be in no apparent distress throughout entirety of phone call.  Patient further reports:  -- no concerns or recent changes to medications; daughter continues managing medications -- home health services continue; expects visit from home health RN today for wound check/ dressing change -- attended wound clinic visit on Monday 03/06/2019: reports she was told wound is healing; verbalizes plans for doppler check in future to assess circulation; states she is waiting on call to schedule; continues using established transportation services for provider appointments --againdenies issues with breathing status post SNF discharge; states she continues using home O2 at 2 L/min as per baseline;still not using CPAP since SNF discharge, again stating thatsinceshe did not use while at [facility] "doesn't see the point." Today, patient states that she "never told me" that she occasionally increases her home O2 when she feels short of breath- states that she always keeps home O2 at 2 L/min; reports biggest trigger for periods of shortness of breath is "anxiety" which does not happen very often; does not  have prescriptions for inhalers/ nebulizer's/ or anxiety medication, per patient report -- confirms PCP manages COPD plan of care -- discussed with patient signs/ symptoms to promptly report around COPD yellow zone- encouraged patient to promptly notify PCP for instruction should she notice signs/ symptoms yellow COPD zone; today, patient reports that she "rarely" has mucous to cough up, and she denies this today -- discussed with patient signs/ symptoms MI and CVA, along with corresponding action plan; discussed reasons in general to contact urgent/ emergent care vs. contact care providers -- daughter's continue monitoring and recording blood sugars; patient does not keep up with blood sugar readings since her daughters "handle it" -- confirms that her daughter/ caregiver continues changing her depends "whenever" she needs it; denies concerns around skin condition  Patient denies further issues, concerns, or problems today. Iconfirmed thatpatienthasmy direct phone number, the main [facility] CM office phone number, and the [facility] CM 24-hour nurse advice phone number should issues arise prior to next scheduled [place] outreachby phone next week. Encouraged patient or caregiverto contact me directly if needs, questions, issues, or concerns arise prior to next scheduled outreach; she agreed to do so.  Plan:  Patient will take medications as prescribed and will attend all scheduled provider appointments  Patient will promptly notify care providers for any new concerns/ issues/ problems that arise  Patient will actively participate in home health services as ordered post-hospital discharge  [place] outreachforongoingtransition of careto continue with scheduled phone call next week  [facility] CM Care Plan Problem One     Most Recent Value  Care Plan Problem One  High risk for hospital readmission related to/ as evidenced by recent hospital and subsequent SNF visit for sepsis  and acute respiratory failure  Role Documenting the Problem One  Care Management Coordinator  Care Plan for Problem One  Active  [facility] Long Term Goal   Over the next 31 days, patient will not experience hospital readmission, as evidenced by patient reporting and review of EMR during [facility] RN CM outreach  [facility] Long Term Goal Start Date  02/07/19  Interventions for Problem One Long Term Goal  Discussed current clinical condition with patient and confirmed that she has no current clinical concerns,  using teach back method, provided education around signs/ symptoms MI/ CVA, along with corresponding action plan,  discussed with patient reasons to contact care provider vs. seek urgent/ emergent care  [facility] CM Short Term Goal #1   Over the next 30 days, patient will actively participate in home health services as evidenced by patient reporting and collaboration with home health team as indicated during [facility] RN CM outreach  [facility] CM Short Term Goal #1 Start Date  02/07/19  Interventions for Short Term Goal #1  Confirmed with patient that home health services continue and confirmed that patient has visit scheduled today with home health RN- encouraged patient's ongoing active participation with home health services  [facility] CM Short Term Goal #2   Over the next 30 days, patient will attend all scheduled provider appointments as evidenced by patient reporting and collaboration with care providers as indicated during [facility] RN CM outreach  [facility] CM Short Term Goal #2 Start Date  02/07/19  Interventions for Short Term Goal #2  Confirmed that patient attended wound clinic office visit this week,  reviewed recent and upcoming provider appointments and confirmed that patient has contact information for all care providers    [facility] CM Care Plan Problem Two     Most Recent Value  Care Plan Problem Two  Self-health management of chronic disease state of COPD, as evidenced by recent hospital admission for COPD exacerbation with pneumonia   Role Documenting the Problem Two  Care Management Coordinator  Care Plan for Problem Two  Active  Interventions for Problem Two Long Term Goal   Discussed patient's current management of COPD,  introduced concept of COPD zones and using teach back method, discussed signs/ symptoms yellow COPD zone along with corresponding action plan,  encouraged patient to promptly notify care providers for any new concerns/ changes that may develop around breathing status,  discussed safe use of oxygen at home with patient  [facility] Long Term Goal  Over the next 60 days, patient will be able to verbalize signs/ symptoms COPD yellow zone, along with corresponding action plan for same, as evidenced by patient reporting during [facility] RN CM outreach  [facility] Long Term Goal Start Date  03/01/19     Oneta Rack, RN, BSN, [employer] Coordinator [facility] Care Management  [phone]

## 2019-03-04 DIAGNOSIS — I509 Heart failure, unspecified: Secondary | ICD-10-CM | POA: Diagnosis not present

## 2019-03-04 DIAGNOSIS — I739 Peripheral vascular disease, unspecified: Secondary | ICD-10-CM | POA: Diagnosis not present

## 2019-03-04 DIAGNOSIS — J449 Chronic obstructive pulmonary disease, unspecified: Secondary | ICD-10-CM | POA: Diagnosis not present

## 2019-03-06 DIAGNOSIS — I5032 Chronic diastolic (congestive) heart failure: Secondary | ICD-10-CM | POA: Diagnosis not present

## 2019-03-06 DIAGNOSIS — M109 Gout, unspecified: Secondary | ICD-10-CM | POA: Diagnosis not present

## 2019-03-06 DIAGNOSIS — J449 Chronic obstructive pulmonary disease, unspecified: Secondary | ICD-10-CM | POA: Diagnosis not present

## 2019-03-06 DIAGNOSIS — E785 Hyperlipidemia, unspecified: Secondary | ICD-10-CM | POA: Diagnosis not present

## 2019-03-06 DIAGNOSIS — E1142 Type 2 diabetes mellitus with diabetic polyneuropathy: Secondary | ICD-10-CM | POA: Diagnosis not present

## 2019-03-06 DIAGNOSIS — I4510 Unspecified right bundle-branch block: Secondary | ICD-10-CM | POA: Diagnosis not present

## 2019-03-06 DIAGNOSIS — R911 Solitary pulmonary nodule: Secondary | ICD-10-CM | POA: Diagnosis not present

## 2019-03-06 DIAGNOSIS — R339 Retention of urine, unspecified: Secondary | ICD-10-CM | POA: Diagnosis not present

## 2019-03-06 DIAGNOSIS — I110 Hypertensive heart disease with heart failure: Secondary | ICD-10-CM | POA: Diagnosis not present

## 2019-03-06 DIAGNOSIS — L89610 Pressure ulcer of right heel, unstageable: Secondary | ICD-10-CM | POA: Diagnosis not present

## 2019-03-06 DIAGNOSIS — K219 Gastro-esophageal reflux disease without esophagitis: Secondary | ICD-10-CM | POA: Diagnosis not present

## 2019-03-06 DIAGNOSIS — E46 Unspecified protein-calorie malnutrition: Secondary | ICD-10-CM | POA: Diagnosis not present

## 2019-03-06 DIAGNOSIS — J9611 Chronic respiratory failure with hypoxia: Secondary | ICD-10-CM | POA: Diagnosis not present

## 2019-03-06 DIAGNOSIS — Z87440 Personal history of urinary (tract) infections: Secondary | ICD-10-CM | POA: Diagnosis not present

## 2019-03-06 DIAGNOSIS — E7800 Pure hypercholesterolemia, unspecified: Secondary | ICD-10-CM | POA: Diagnosis not present

## 2019-03-06 DIAGNOSIS — K760 Fatty (change of) liver, not elsewhere classified: Secondary | ICD-10-CM | POA: Diagnosis not present

## 2019-03-06 DIAGNOSIS — M8580 Other specified disorders of bone density and structure, unspecified site: Secondary | ICD-10-CM | POA: Diagnosis not present

## 2019-03-06 DIAGNOSIS — J9612 Chronic respiratory failure with hypercapnia: Secondary | ICD-10-CM | POA: Diagnosis not present

## 2019-03-06 DIAGNOSIS — G4733 Obstructive sleep apnea (adult) (pediatric): Secondary | ICD-10-CM | POA: Diagnosis not present

## 2019-03-06 DIAGNOSIS — Z794 Long term (current) use of insulin: Secondary | ICD-10-CM | POA: Diagnosis not present

## 2019-03-06 DIAGNOSIS — Z9981 Dependence on supplemental oxygen: Secondary | ICD-10-CM | POA: Diagnosis not present

## 2019-03-06 DIAGNOSIS — M159 Polyosteoarthritis, unspecified: Secondary | ICD-10-CM | POA: Diagnosis not present

## 2019-03-06 DIAGNOSIS — D509 Iron deficiency anemia, unspecified: Secondary | ICD-10-CM | POA: Diagnosis not present

## 2019-03-06 DIAGNOSIS — E1151 Type 2 diabetes mellitus with diabetic peripheral angiopathy without gangrene: Secondary | ICD-10-CM | POA: Diagnosis not present

## 2019-03-06 DIAGNOSIS — Z7982 Long term (current) use of aspirin: Secondary | ICD-10-CM | POA: Diagnosis not present

## 2019-03-07 DIAGNOSIS — K219 Gastro-esophageal reflux disease without esophagitis: Secondary | ICD-10-CM | POA: Diagnosis not present

## 2019-03-07 DIAGNOSIS — E118 Type 2 diabetes mellitus with unspecified complications: Secondary | ICD-10-CM | POA: Diagnosis not present

## 2019-03-07 DIAGNOSIS — D649 Anemia, unspecified: Secondary | ICD-10-CM | POA: Diagnosis not present

## 2019-03-07 DIAGNOSIS — I4510 Unspecified right bundle-branch block: Secondary | ICD-10-CM | POA: Diagnosis not present

## 2019-03-07 DIAGNOSIS — J449 Chronic obstructive pulmonary disease, unspecified: Secondary | ICD-10-CM | POA: Diagnosis not present

## 2019-03-08 DIAGNOSIS — K219 Gastro-esophageal reflux disease without esophagitis: Secondary | ICD-10-CM | POA: Diagnosis not present

## 2019-03-08 DIAGNOSIS — E7800 Pure hypercholesterolemia, unspecified: Secondary | ICD-10-CM | POA: Diagnosis not present

## 2019-03-08 DIAGNOSIS — E785 Hyperlipidemia, unspecified: Secondary | ICD-10-CM | POA: Diagnosis not present

## 2019-03-08 DIAGNOSIS — J9612 Chronic respiratory failure with hypercapnia: Secondary | ICD-10-CM | POA: Diagnosis not present

## 2019-03-08 DIAGNOSIS — I4510 Unspecified right bundle-branch block: Secondary | ICD-10-CM | POA: Diagnosis not present

## 2019-03-08 DIAGNOSIS — Z87440 Personal history of urinary (tract) infections: Secondary | ICD-10-CM | POA: Diagnosis not present

## 2019-03-08 DIAGNOSIS — G4733 Obstructive sleep apnea (adult) (pediatric): Secondary | ICD-10-CM | POA: Diagnosis not present

## 2019-03-08 DIAGNOSIS — I5032 Chronic diastolic (congestive) heart failure: Secondary | ICD-10-CM | POA: Diagnosis not present

## 2019-03-08 DIAGNOSIS — R339 Retention of urine, unspecified: Secondary | ICD-10-CM | POA: Diagnosis not present

## 2019-03-08 DIAGNOSIS — M159 Polyosteoarthritis, unspecified: Secondary | ICD-10-CM | POA: Diagnosis not present

## 2019-03-08 DIAGNOSIS — Z794 Long term (current) use of insulin: Secondary | ICD-10-CM | POA: Diagnosis not present

## 2019-03-08 DIAGNOSIS — R911 Solitary pulmonary nodule: Secondary | ICD-10-CM | POA: Diagnosis not present

## 2019-03-08 DIAGNOSIS — J449 Chronic obstructive pulmonary disease, unspecified: Secondary | ICD-10-CM | POA: Diagnosis not present

## 2019-03-08 DIAGNOSIS — E1142 Type 2 diabetes mellitus with diabetic polyneuropathy: Secondary | ICD-10-CM | POA: Diagnosis not present

## 2019-03-08 DIAGNOSIS — E1151 Type 2 diabetes mellitus with diabetic peripheral angiopathy without gangrene: Secondary | ICD-10-CM | POA: Diagnosis not present

## 2019-03-08 DIAGNOSIS — D509 Iron deficiency anemia, unspecified: Secondary | ICD-10-CM | POA: Diagnosis not present

## 2019-03-08 DIAGNOSIS — Z7982 Long term (current) use of aspirin: Secondary | ICD-10-CM | POA: Diagnosis not present

## 2019-03-08 DIAGNOSIS — M109 Gout, unspecified: Secondary | ICD-10-CM | POA: Diagnosis not present

## 2019-03-08 DIAGNOSIS — K760 Fatty (change of) liver, not elsewhere classified: Secondary | ICD-10-CM | POA: Diagnosis not present

## 2019-03-08 DIAGNOSIS — J9611 Chronic respiratory failure with hypoxia: Secondary | ICD-10-CM | POA: Diagnosis not present

## 2019-03-08 DIAGNOSIS — E46 Unspecified protein-calorie malnutrition: Secondary | ICD-10-CM | POA: Diagnosis not present

## 2019-03-08 DIAGNOSIS — L89610 Pressure ulcer of right heel, unstageable: Secondary | ICD-10-CM | POA: Diagnosis not present

## 2019-03-08 DIAGNOSIS — Z9981 Dependence on supplemental oxygen: Secondary | ICD-10-CM | POA: Diagnosis not present

## 2019-03-08 DIAGNOSIS — I110 Hypertensive heart disease with heart failure: Secondary | ICD-10-CM | POA: Diagnosis not present

## 2019-03-08 DIAGNOSIS — M8580 Other specified disorders of bone density and structure, unspecified site: Secondary | ICD-10-CM | POA: Diagnosis not present

## 2019-03-09 ENCOUNTER — Encounter: Payer: Self-pay | Admitting: *Deleted

## 2019-03-09 ENCOUNTER — Other Ambulatory Visit: Payer: Self-pay | Admitting: *Deleted

## 2019-03-09 DIAGNOSIS — L97412 Non-pressure chronic ulcer of right heel and midfoot with fat layer exposed: Secondary | ICD-10-CM | POA: Diagnosis not present

## 2019-03-09 DIAGNOSIS — I10 Essential (primary) hypertension: Secondary | ICD-10-CM | POA: Diagnosis not present

## 2019-03-09 DIAGNOSIS — I70234 Atherosclerosis of native arteries of right leg with ulceration of heel and midfoot: Secondary | ICD-10-CM | POA: Diagnosis not present

## 2019-03-09 DIAGNOSIS — E11621 Type 2 diabetes mellitus with foot ulcer: Secondary | ICD-10-CM | POA: Diagnosis not present

## 2019-03-09 NOTE — Patient Outreach (Signed)
[facility]) Care Management [facility] Community CM Telephone Outreach, Transition of Care day # 31  03/09/2019  Cassandra Barnes 1945/05/07 [id number]  Successful telephone outreach to Cassandra Barnes, 73 y/o female with recent hospitalization at [place] [facility] December 13, 2018 for AMS/ sepsis/ hypoxic respiratory failure related to pneumonia/ UTI; patient was discharged from hospital to SNF for rehabilitation and was apparently recently discharged from SNF back home to self carewith home health services in place. Patient has history including, but not limited to, COPD, on home O2; OSA with CPAP; DM; and morbid obesity.HIPAA/ identity verified with patient during phone call today.  Today, patient again reports that she "is doing just fine," and shedeniespain/ new/ recent falls. Patient reports that her fasting blood sugar this morning was 29; stated that she did not have her usual bedtime snack last night and believes that is why her blood sugar was so low.  Patient reports that she ate candy and drank sugary drink and went to her scheduled wound center appointment as scheduled.  Stated that she felt fine and had no symptoms from the low reading this morning; stated by the time she got to the wound center, her blood sugar had increased back to 117.  Stated that she is eating lunch now and she denies concerns around this isolated low blood sugar reading.  Patient/ daughter/ caregiver were encouraged to monitor her blood sugars more often today if possible, and we discussed signs/ symptoms hypoglycemia.  Patient and daughter were encouraged to promptly report any additional low blood sugar readings to PCP.  Patient confirms that she did NOT take her morning insulin after having had this low reading.  Patient sounds to be in no apparent distress throughout entirety of phone call.  Patient further reports:  -- no concerns or recent changes to medications; daughter continues managing  medications; able to accurately verbalize dosing for both long-acting and sliding scale insulin -- home health services continue; home health RN visited on Monday 03/06/19 and is expected to return tomorrow; patient reports RN checks (R) foot wound and changes foot dressing with each visit; reports she is unable to see wound herself, but has been told by wound clinic and home health RN "seems to be healing." -- attended wound clinic visit earlier today; used established transportation services/ community resource --againdenies issues with breathing status; states she continues using home O2 at 2 L/min as per baseline;still not using CPAP since SNF discharge, again stating thatshe does not plan to use, since they didn't use it at the hospital/ SNF with her recent visits ---- discussed/ reviewed signs/ symptoms yellow COPD zone and patient again denies all- states that she "rarely" gets short of breath; using teach back method, discussed action plan for COPD yellow zone ---- confirms that she has and checks SaO2 levels at home:  Reports level today at "100%" -- discussed self-health management of DM with patient/ daughter caregiver: both report that patient's usual blood sugar ranges at home are "between 200-300" for fasting and post-prandial checks; states that she "hardly ever" runs low like she did this morning.  Discussed with patient dangers of low blood sugars; confirmed that daughter records blood sugars at home; encouraged daughter/ patient to seek immediate care if she has symptoms of low blood sugar that do not improve with food/ sugar intake.  Discussed appropriate food to eat when blood sugars are low.  Encouraged both to make patient's care providers aware of all high/ low blood sugar reading  at home and they verbalize agreement/ understanding ---- discussed with patient significance of A1-C values; patient reports that she does not remember her most recent A1-C value and this is not in  patient's EMR for review- encouraged patient to ask her PCP about her most recent A1-C value when she attends scheduled provider appointment in 2 weeks ---- discussed use of insulin during times patient feels sick- encouraged patient/ caregiver to promptly notify care providers any time patient feels sick or has low- high blood sugar readings; patient stated that she "always runs high," but she agrees to contact care providers if she has any more extremely low blood sugars -- confirmed that daughter/ caregiver continues assisting patient with all tolieting/ hygiene care at home   Patient denies further issues, concerns, or problems today. Iconfirmed thatpatienthasmy direct phone number, the main [facility] CM office phone number, and the [facility] CM 24-hour nurse advice phone number should issues arise prior to next scheduled [place] outreachby phone in 3 weeks, post-scheduled upcoming PCP appointment. Encouraged patient or caregiverto contact me directly if needs, questions, issues, or concerns arise prior to next scheduled outreach; she agreed to do so.  Plan:  Patient will take medications as prescribed and will attend all scheduled provider appointments  Patient will promptly notify care providers for any new concerns/ issues/ problems that arise  Patient will actively participate in home health services as ordered post-hospital discharge  Patient will monitor and record blood sugars 3-4 times per day and will discuss her recent low blood sugar/ A1-C with PCP at upcoming scheduled visit  [place] outreachto continue with scheduled phone call in 3 weeks  [facility] CM Care Plan Problem One     Most Recent Value  Care Plan Problem One  High risk for hospital readmission related to/ as evidenced by recent hospital and subsequent SNF visit for sepsis and acute respiratory failure  Role Documenting the Problem One  Care Management Coordinator  Care Plan for Problem One  Not Active   [facility] Long Term Goal   Over the next 31 days, patient will not experience hospital readmission, as evidenced by patient reporting and review of EMR during [facility] RN CM outreach  [facility] Long Term Goal Start Date  02/07/19  [facility] Long Term Goal Met Date  03/09/19 [Goal met]  Interventions for Problem One Long Term Goal  Discussed with patient her current clinical condition and confirmed that she has no current clinical concerns,  confirmed that patient has had no recent inpatient or ED hospital visits,  encouraged patient to promptly notify care providers for any new concerns that arise  [facility] CM Short Term Goal #1   Over the next 30 days, patient will actively participate in home health services as evidenced by patient reporting and collaboration with home health team as indicated during [facility] RN CM outreach  [facility] CM Short Term Goal #1 Start Date  02/07/19  [facility] CM Short Term Goal #1 Met Date  03/09/19 [Goal Met]  Interventions for Short Term Goal #1  Confirmed that home health services remain active and that patient continues participating in home health services,  reviewed this week's home health RN visits and encouraged patient's ongoing active participation  [facility] CM Short Term Goal #2   Over the next 30 days, patient will attend all scheduled provider appointments as evidenced by patient reporting and collaboration with care providers as indicated during [facility] RN CM outreach  [facility] CM Short Term Goal #2 Start Date  02/07/19  [facility] CM Short Term Goal #2 Met Date  03/09/19 [Goal Met]  Interventions for Short Term Goal #2  Confirmed that patient has attended all recent provider appointments and reviewed today's wound center visit and upcoming scheduled provider appointments,  confirmed that patient continues using established transportation services through community resources    [facility] CM Care Plan Problem Two     Most Recent Value  Care Plan Problem Two  Self-health management of chronic disease state of COPD, as evidenced  by recent hospital admission for COPD exacerbation with pneumonia  Role Documenting the Problem Two  Care Management Coordinator  Care Plan for Problem Two  Active  Interventions for Problem Two Long Term Goal   Discussed with patient her current breathing status and confirmed that she has not had recent signs/ symptoms yellow COPD zone,  discussed current use of home O2,  reviewed recent SaO2 values at home,  reviewed previously provided education around signs/ symptoms concerning for yellow COPD zone with corresponding action plan  [facility] Long Term Goal  Over the next 60 days, patient will be able to verbalize signs/ symptoms COPD yellow zone, along with corresponding action plan for same, as evidenced by patient reporting during [facility] RN CM outreach  [facility] Long Term Goal Start Date  03/01/19    [facility] CM Care Plan Problem Three     Most Recent Value  Care Plan Problem Three  Knowledge deficit related to self-health management of chronic disease state of DM, as evidenced by patient reporting  Role Documenting the Problem Three  Care Management Coordinator  Care Plan for Problem Three  Active  [facility] Long Term Goal   Over the next 60 days, patient will continue to monitor and record blood sugar values at home, as evidenced by review of same with patient and/ or caregiver  [facility] Long Term Goal Start Date  03/09/19  Interventions for Problem Three Long Term Goal  Reviewed today's isolated low blood sugar reading and discussed with patient action taken in response to low blood sugar,  discussed action plan for low blood sugars and reviewed overall blood sugar trends at home with patient,  discussed with patient signs/ symptoms low blood sugar,  encouraged patient and caregiver to immediately report any ongoing low blood sugar readings to PCP- encouraged patient to take all recorded blood sugars to upcoming PCP office visit  [facility] CM Short Term Goal #1   Over the next 20 days, patient will attend PCP office visit and  will discuss her recent A1-C values with PCP, as evidenced patient reporting and collaboration with PCP as indicated  [facility] CM Short Term Goal #1 Start Date  03/09/19  Interventions for Short Term Goal #1  Using teach back method, discussed significance of A1-C values with patient,  encouraged patient to inquire about her A1-C values when she attends upcoming PCP office visit     Cassandra Rack, RN, BSN, [employer] Coordinator [facility] Care Management  [phone]

## 2019-03-10 DIAGNOSIS — D509 Iron deficiency anemia, unspecified: Secondary | ICD-10-CM | POA: Diagnosis not present

## 2019-03-10 DIAGNOSIS — E46 Unspecified protein-calorie malnutrition: Secondary | ICD-10-CM | POA: Diagnosis not present

## 2019-03-10 DIAGNOSIS — I5032 Chronic diastolic (congestive) heart failure: Secondary | ICD-10-CM | POA: Diagnosis not present

## 2019-03-10 DIAGNOSIS — M159 Polyosteoarthritis, unspecified: Secondary | ICD-10-CM | POA: Diagnosis not present

## 2019-03-10 DIAGNOSIS — G4733 Obstructive sleep apnea (adult) (pediatric): Secondary | ICD-10-CM | POA: Diagnosis not present

## 2019-03-10 DIAGNOSIS — Z7982 Long term (current) use of aspirin: Secondary | ICD-10-CM | POA: Diagnosis not present

## 2019-03-10 DIAGNOSIS — E7800 Pure hypercholesterolemia, unspecified: Secondary | ICD-10-CM | POA: Diagnosis not present

## 2019-03-10 DIAGNOSIS — M109 Gout, unspecified: Secondary | ICD-10-CM | POA: Diagnosis not present

## 2019-03-10 DIAGNOSIS — Z87440 Personal history of urinary (tract) infections: Secondary | ICD-10-CM | POA: Diagnosis not present

## 2019-03-10 DIAGNOSIS — M8580 Other specified disorders of bone density and structure, unspecified site: Secondary | ICD-10-CM | POA: Diagnosis not present

## 2019-03-10 DIAGNOSIS — E1142 Type 2 diabetes mellitus with diabetic polyneuropathy: Secondary | ICD-10-CM | POA: Diagnosis not present

## 2019-03-10 DIAGNOSIS — R911 Solitary pulmonary nodule: Secondary | ICD-10-CM | POA: Diagnosis not present

## 2019-03-10 DIAGNOSIS — I110 Hypertensive heart disease with heart failure: Secondary | ICD-10-CM | POA: Diagnosis not present

## 2019-03-10 DIAGNOSIS — J9611 Chronic respiratory failure with hypoxia: Secondary | ICD-10-CM | POA: Diagnosis not present

## 2019-03-10 DIAGNOSIS — Z794 Long term (current) use of insulin: Secondary | ICD-10-CM | POA: Diagnosis not present

## 2019-03-10 DIAGNOSIS — Z9981 Dependence on supplemental oxygen: Secondary | ICD-10-CM | POA: Diagnosis not present

## 2019-03-10 DIAGNOSIS — K219 Gastro-esophageal reflux disease without esophagitis: Secondary | ICD-10-CM | POA: Diagnosis not present

## 2019-03-10 DIAGNOSIS — J449 Chronic obstructive pulmonary disease, unspecified: Secondary | ICD-10-CM | POA: Diagnosis not present

## 2019-03-10 DIAGNOSIS — J9612 Chronic respiratory failure with hypercapnia: Secondary | ICD-10-CM | POA: Diagnosis not present

## 2019-03-10 DIAGNOSIS — R339 Retention of urine, unspecified: Secondary | ICD-10-CM | POA: Diagnosis not present

## 2019-03-10 DIAGNOSIS — E785 Hyperlipidemia, unspecified: Secondary | ICD-10-CM | POA: Diagnosis not present

## 2019-03-10 DIAGNOSIS — I4510 Unspecified right bundle-branch block: Secondary | ICD-10-CM | POA: Diagnosis not present

## 2019-03-10 DIAGNOSIS — L89610 Pressure ulcer of right heel, unstageable: Secondary | ICD-10-CM | POA: Diagnosis not present

## 2019-03-10 DIAGNOSIS — E1151 Type 2 diabetes mellitus with diabetic peripheral angiopathy without gangrene: Secondary | ICD-10-CM | POA: Diagnosis not present

## 2019-03-10 DIAGNOSIS — K760 Fatty (change of) liver, not elsewhere classified: Secondary | ICD-10-CM | POA: Diagnosis not present

## 2019-03-13 DIAGNOSIS — I5032 Chronic diastolic (congestive) heart failure: Secondary | ICD-10-CM | POA: Diagnosis not present

## 2019-03-13 DIAGNOSIS — K219 Gastro-esophageal reflux disease without esophagitis: Secondary | ICD-10-CM | POA: Diagnosis not present

## 2019-03-13 DIAGNOSIS — E46 Unspecified protein-calorie malnutrition: Secondary | ICD-10-CM | POA: Diagnosis not present

## 2019-03-13 DIAGNOSIS — L89610 Pressure ulcer of right heel, unstageable: Secondary | ICD-10-CM | POA: Diagnosis not present

## 2019-03-13 DIAGNOSIS — G4733 Obstructive sleep apnea (adult) (pediatric): Secondary | ICD-10-CM | POA: Diagnosis not present

## 2019-03-13 DIAGNOSIS — J449 Chronic obstructive pulmonary disease, unspecified: Secondary | ICD-10-CM | POA: Diagnosis not present

## 2019-03-13 DIAGNOSIS — J9612 Chronic respiratory failure with hypercapnia: Secondary | ICD-10-CM | POA: Diagnosis not present

## 2019-03-13 DIAGNOSIS — E785 Hyperlipidemia, unspecified: Secondary | ICD-10-CM | POA: Diagnosis not present

## 2019-03-13 DIAGNOSIS — E11621 Type 2 diabetes mellitus with foot ulcer: Secondary | ICD-10-CM | POA: Diagnosis not present

## 2019-03-13 DIAGNOSIS — R339 Retention of urine, unspecified: Secondary | ICD-10-CM | POA: Diagnosis not present

## 2019-03-13 DIAGNOSIS — I110 Hypertensive heart disease with heart failure: Secondary | ICD-10-CM | POA: Diagnosis not present

## 2019-03-13 DIAGNOSIS — E1151 Type 2 diabetes mellitus with diabetic peripheral angiopathy without gangrene: Secondary | ICD-10-CM | POA: Diagnosis not present

## 2019-03-13 DIAGNOSIS — Z794 Long term (current) use of insulin: Secondary | ICD-10-CM | POA: Diagnosis not present

## 2019-03-13 DIAGNOSIS — J9611 Chronic respiratory failure with hypoxia: Secondary | ICD-10-CM | POA: Diagnosis not present

## 2019-03-13 DIAGNOSIS — M8580 Other specified disorders of bone density and structure, unspecified site: Secondary | ICD-10-CM | POA: Diagnosis not present

## 2019-03-13 DIAGNOSIS — M159 Polyosteoarthritis, unspecified: Secondary | ICD-10-CM | POA: Diagnosis not present

## 2019-03-13 DIAGNOSIS — Z9981 Dependence on supplemental oxygen: Secondary | ICD-10-CM | POA: Diagnosis not present

## 2019-03-13 DIAGNOSIS — K760 Fatty (change of) liver, not elsewhere classified: Secondary | ICD-10-CM | POA: Diagnosis not present

## 2019-03-13 DIAGNOSIS — E7800 Pure hypercholesterolemia, unspecified: Secondary | ICD-10-CM | POA: Diagnosis not present

## 2019-03-13 DIAGNOSIS — M109 Gout, unspecified: Secondary | ICD-10-CM | POA: Diagnosis not present

## 2019-03-13 DIAGNOSIS — I4510 Unspecified right bundle-branch block: Secondary | ICD-10-CM | POA: Diagnosis not present

## 2019-03-13 DIAGNOSIS — S91301A Unspecified open wound, right foot, initial encounter: Secondary | ICD-10-CM | POA: Diagnosis not present

## 2019-03-13 DIAGNOSIS — Z87440 Personal history of urinary (tract) infections: Secondary | ICD-10-CM | POA: Diagnosis not present

## 2019-03-13 DIAGNOSIS — Z7982 Long term (current) use of aspirin: Secondary | ICD-10-CM | POA: Diagnosis not present

## 2019-03-13 DIAGNOSIS — D509 Iron deficiency anemia, unspecified: Secondary | ICD-10-CM | POA: Diagnosis not present

## 2019-03-13 DIAGNOSIS — E1142 Type 2 diabetes mellitus with diabetic polyneuropathy: Secondary | ICD-10-CM | POA: Diagnosis not present

## 2019-03-13 DIAGNOSIS — R911 Solitary pulmonary nodule: Secondary | ICD-10-CM | POA: Diagnosis not present

## 2019-03-15 DIAGNOSIS — M8580 Other specified disorders of bone density and structure, unspecified site: Secondary | ICD-10-CM | POA: Diagnosis not present

## 2019-03-15 DIAGNOSIS — E1151 Type 2 diabetes mellitus with diabetic peripheral angiopathy without gangrene: Secondary | ICD-10-CM | POA: Diagnosis not present

## 2019-03-15 DIAGNOSIS — M109 Gout, unspecified: Secondary | ICD-10-CM | POA: Diagnosis not present

## 2019-03-15 DIAGNOSIS — K219 Gastro-esophageal reflux disease without esophagitis: Secondary | ICD-10-CM | POA: Diagnosis not present

## 2019-03-15 DIAGNOSIS — E785 Hyperlipidemia, unspecified: Secondary | ICD-10-CM | POA: Diagnosis not present

## 2019-03-15 DIAGNOSIS — D509 Iron deficiency anemia, unspecified: Secondary | ICD-10-CM | POA: Diagnosis not present

## 2019-03-15 DIAGNOSIS — L89610 Pressure ulcer of right heel, unstageable: Secondary | ICD-10-CM | POA: Diagnosis not present

## 2019-03-15 DIAGNOSIS — Z7982 Long term (current) use of aspirin: Secondary | ICD-10-CM | POA: Diagnosis not present

## 2019-03-15 DIAGNOSIS — I110 Hypertensive heart disease with heart failure: Secondary | ICD-10-CM | POA: Diagnosis not present

## 2019-03-15 DIAGNOSIS — K760 Fatty (change of) liver, not elsewhere classified: Secondary | ICD-10-CM | POA: Diagnosis not present

## 2019-03-15 DIAGNOSIS — I4510 Unspecified right bundle-branch block: Secondary | ICD-10-CM | POA: Diagnosis not present

## 2019-03-15 DIAGNOSIS — E1142 Type 2 diabetes mellitus with diabetic polyneuropathy: Secondary | ICD-10-CM | POA: Diagnosis not present

## 2019-03-15 DIAGNOSIS — Z87440 Personal history of urinary (tract) infections: Secondary | ICD-10-CM | POA: Diagnosis not present

## 2019-03-15 DIAGNOSIS — R911 Solitary pulmonary nodule: Secondary | ICD-10-CM | POA: Diagnosis not present

## 2019-03-15 DIAGNOSIS — E46 Unspecified protein-calorie malnutrition: Secondary | ICD-10-CM | POA: Diagnosis not present

## 2019-03-15 DIAGNOSIS — J9612 Chronic respiratory failure with hypercapnia: Secondary | ICD-10-CM | POA: Diagnosis not present

## 2019-03-15 DIAGNOSIS — Z9981 Dependence on supplemental oxygen: Secondary | ICD-10-CM | POA: Diagnosis not present

## 2019-03-15 DIAGNOSIS — I5032 Chronic diastolic (congestive) heart failure: Secondary | ICD-10-CM | POA: Diagnosis not present

## 2019-03-15 DIAGNOSIS — J449 Chronic obstructive pulmonary disease, unspecified: Secondary | ICD-10-CM | POA: Diagnosis not present

## 2019-03-15 DIAGNOSIS — G4733 Obstructive sleep apnea (adult) (pediatric): Secondary | ICD-10-CM | POA: Diagnosis not present

## 2019-03-15 DIAGNOSIS — E7800 Pure hypercholesterolemia, unspecified: Secondary | ICD-10-CM | POA: Diagnosis not present

## 2019-03-15 DIAGNOSIS — Z794 Long term (current) use of insulin: Secondary | ICD-10-CM | POA: Diagnosis not present

## 2019-03-15 DIAGNOSIS — J9611 Chronic respiratory failure with hypoxia: Secondary | ICD-10-CM | POA: Diagnosis not present

## 2019-03-15 DIAGNOSIS — R339 Retention of urine, unspecified: Secondary | ICD-10-CM | POA: Diagnosis not present

## 2019-03-15 DIAGNOSIS — M159 Polyosteoarthritis, unspecified: Secondary | ICD-10-CM | POA: Diagnosis not present

## 2019-03-16 DIAGNOSIS — L89613 Pressure ulcer of right heel, stage 3: Secondary | ICD-10-CM | POA: Diagnosis not present

## 2019-03-16 DIAGNOSIS — L97412 Non-pressure chronic ulcer of right heel and midfoot with fat layer exposed: Secondary | ICD-10-CM | POA: Diagnosis not present

## 2019-03-16 DIAGNOSIS — E11621 Type 2 diabetes mellitus with foot ulcer: Secondary | ICD-10-CM | POA: Diagnosis not present

## 2019-03-16 DIAGNOSIS — E11628 Type 2 diabetes mellitus with other skin complications: Secondary | ICD-10-CM | POA: Diagnosis not present

## 2019-03-17 DIAGNOSIS — K760 Fatty (change of) liver, not elsewhere classified: Secondary | ICD-10-CM | POA: Diagnosis not present

## 2019-03-17 DIAGNOSIS — R911 Solitary pulmonary nodule: Secondary | ICD-10-CM | POA: Diagnosis not present

## 2019-03-17 DIAGNOSIS — Z9981 Dependence on supplemental oxygen: Secondary | ICD-10-CM | POA: Diagnosis not present

## 2019-03-17 DIAGNOSIS — M109 Gout, unspecified: Secondary | ICD-10-CM | POA: Diagnosis not present

## 2019-03-17 DIAGNOSIS — Z7982 Long term (current) use of aspirin: Secondary | ICD-10-CM | POA: Diagnosis not present

## 2019-03-17 DIAGNOSIS — R339 Retention of urine, unspecified: Secondary | ICD-10-CM | POA: Diagnosis not present

## 2019-03-17 DIAGNOSIS — J9612 Chronic respiratory failure with hypercapnia: Secondary | ICD-10-CM | POA: Diagnosis not present

## 2019-03-17 DIAGNOSIS — E1142 Type 2 diabetes mellitus with diabetic polyneuropathy: Secondary | ICD-10-CM | POA: Diagnosis not present

## 2019-03-17 DIAGNOSIS — M159 Polyosteoarthritis, unspecified: Secondary | ICD-10-CM | POA: Diagnosis not present

## 2019-03-17 DIAGNOSIS — M8580 Other specified disorders of bone density and structure, unspecified site: Secondary | ICD-10-CM | POA: Diagnosis not present

## 2019-03-17 DIAGNOSIS — L89610 Pressure ulcer of right heel, unstageable: Secondary | ICD-10-CM | POA: Diagnosis not present

## 2019-03-17 DIAGNOSIS — E7800 Pure hypercholesterolemia, unspecified: Secondary | ICD-10-CM | POA: Diagnosis not present

## 2019-03-17 DIAGNOSIS — D509 Iron deficiency anemia, unspecified: Secondary | ICD-10-CM | POA: Diagnosis not present

## 2019-03-17 DIAGNOSIS — E1151 Type 2 diabetes mellitus with diabetic peripheral angiopathy without gangrene: Secondary | ICD-10-CM | POA: Diagnosis not present

## 2019-03-17 DIAGNOSIS — E46 Unspecified protein-calorie malnutrition: Secondary | ICD-10-CM | POA: Diagnosis not present

## 2019-03-17 DIAGNOSIS — I5032 Chronic diastolic (congestive) heart failure: Secondary | ICD-10-CM | POA: Diagnosis not present

## 2019-03-17 DIAGNOSIS — J9611 Chronic respiratory failure with hypoxia: Secondary | ICD-10-CM | POA: Diagnosis not present

## 2019-03-17 DIAGNOSIS — J449 Chronic obstructive pulmonary disease, unspecified: Secondary | ICD-10-CM | POA: Diagnosis not present

## 2019-03-17 DIAGNOSIS — E785 Hyperlipidemia, unspecified: Secondary | ICD-10-CM | POA: Diagnosis not present

## 2019-03-17 DIAGNOSIS — I110 Hypertensive heart disease with heart failure: Secondary | ICD-10-CM | POA: Diagnosis not present

## 2019-03-17 DIAGNOSIS — K219 Gastro-esophageal reflux disease without esophagitis: Secondary | ICD-10-CM | POA: Diagnosis not present

## 2019-03-17 DIAGNOSIS — Z794 Long term (current) use of insulin: Secondary | ICD-10-CM | POA: Diagnosis not present

## 2019-03-17 DIAGNOSIS — I4510 Unspecified right bundle-branch block: Secondary | ICD-10-CM | POA: Diagnosis not present

## 2019-03-17 DIAGNOSIS — G4733 Obstructive sleep apnea (adult) (pediatric): Secondary | ICD-10-CM | POA: Diagnosis not present

## 2019-03-17 DIAGNOSIS — Z87440 Personal history of urinary (tract) infections: Secondary | ICD-10-CM | POA: Diagnosis not present

## 2019-03-20 ENCOUNTER — Telehealth (HOSPITAL_COMMUNITY): Payer: Self-pay | Admitting: *Deleted

## 2019-03-20 DIAGNOSIS — E7800 Pure hypercholesterolemia, unspecified: Secondary | ICD-10-CM | POA: Diagnosis not present

## 2019-03-20 DIAGNOSIS — J9612 Chronic respiratory failure with hypercapnia: Secondary | ICD-10-CM | POA: Diagnosis not present

## 2019-03-20 DIAGNOSIS — K219 Gastro-esophageal reflux disease without esophagitis: Secondary | ICD-10-CM | POA: Diagnosis not present

## 2019-03-20 DIAGNOSIS — D509 Iron deficiency anemia, unspecified: Secondary | ICD-10-CM | POA: Diagnosis not present

## 2019-03-20 DIAGNOSIS — E1151 Type 2 diabetes mellitus with diabetic peripheral angiopathy without gangrene: Secondary | ICD-10-CM | POA: Diagnosis not present

## 2019-03-20 DIAGNOSIS — M8580 Other specified disorders of bone density and structure, unspecified site: Secondary | ICD-10-CM | POA: Diagnosis not present

## 2019-03-20 DIAGNOSIS — R339 Retention of urine, unspecified: Secondary | ICD-10-CM | POA: Diagnosis not present

## 2019-03-20 DIAGNOSIS — Z9981 Dependence on supplemental oxygen: Secondary | ICD-10-CM | POA: Diagnosis not present

## 2019-03-20 DIAGNOSIS — I5032 Chronic diastolic (congestive) heart failure: Secondary | ICD-10-CM | POA: Diagnosis not present

## 2019-03-20 DIAGNOSIS — E1142 Type 2 diabetes mellitus with diabetic polyneuropathy: Secondary | ICD-10-CM | POA: Diagnosis not present

## 2019-03-20 DIAGNOSIS — Z7982 Long term (current) use of aspirin: Secondary | ICD-10-CM | POA: Diagnosis not present

## 2019-03-20 DIAGNOSIS — G4733 Obstructive sleep apnea (adult) (pediatric): Secondary | ICD-10-CM | POA: Diagnosis not present

## 2019-03-20 DIAGNOSIS — M109 Gout, unspecified: Secondary | ICD-10-CM | POA: Diagnosis not present

## 2019-03-20 DIAGNOSIS — L89610 Pressure ulcer of right heel, unstageable: Secondary | ICD-10-CM | POA: Diagnosis not present

## 2019-03-20 DIAGNOSIS — Z87440 Personal history of urinary (tract) infections: Secondary | ICD-10-CM | POA: Diagnosis not present

## 2019-03-20 DIAGNOSIS — J449 Chronic obstructive pulmonary disease, unspecified: Secondary | ICD-10-CM | POA: Diagnosis not present

## 2019-03-20 DIAGNOSIS — I4510 Unspecified right bundle-branch block: Secondary | ICD-10-CM | POA: Diagnosis not present

## 2019-03-20 DIAGNOSIS — E785 Hyperlipidemia, unspecified: Secondary | ICD-10-CM | POA: Diagnosis not present

## 2019-03-20 DIAGNOSIS — E46 Unspecified protein-calorie malnutrition: Secondary | ICD-10-CM | POA: Diagnosis not present

## 2019-03-20 DIAGNOSIS — Z794 Long term (current) use of insulin: Secondary | ICD-10-CM | POA: Diagnosis not present

## 2019-03-20 DIAGNOSIS — R911 Solitary pulmonary nodule: Secondary | ICD-10-CM | POA: Diagnosis not present

## 2019-03-20 DIAGNOSIS — M159 Polyosteoarthritis, unspecified: Secondary | ICD-10-CM | POA: Diagnosis not present

## 2019-03-20 DIAGNOSIS — K760 Fatty (change of) liver, not elsewhere classified: Secondary | ICD-10-CM | POA: Diagnosis not present

## 2019-03-20 DIAGNOSIS — J9611 Chronic respiratory failure with hypoxia: Secondary | ICD-10-CM | POA: Diagnosis not present

## 2019-03-20 DIAGNOSIS — I110 Hypertensive heart disease with heart failure: Secondary | ICD-10-CM | POA: Diagnosis not present

## 2019-03-20 NOTE — Telephone Encounter (Signed)
VM left on my ext. stating Cassandra Barnes- cancel appointments 03/11/2019 & 03/28/2019.  Appts. cancelled.

## 2019-03-21 ENCOUNTER — Encounter (HOSPITAL_COMMUNITY): Payer: Medicare Other

## 2019-03-21 DIAGNOSIS — E118 Type 2 diabetes mellitus with unspecified complications: Secondary | ICD-10-CM | POA: Diagnosis not present

## 2019-03-21 DIAGNOSIS — I4510 Unspecified right bundle-branch block: Secondary | ICD-10-CM | POA: Diagnosis not present

## 2019-03-21 DIAGNOSIS — J449 Chronic obstructive pulmonary disease, unspecified: Secondary | ICD-10-CM | POA: Diagnosis not present

## 2019-03-21 DIAGNOSIS — D649 Anemia, unspecified: Secondary | ICD-10-CM | POA: Diagnosis not present

## 2019-03-21 DIAGNOSIS — B359 Dermatophytosis, unspecified: Secondary | ICD-10-CM | POA: Diagnosis not present

## 2019-03-22 DIAGNOSIS — M8580 Other specified disorders of bone density and structure, unspecified site: Secondary | ICD-10-CM | POA: Diagnosis not present

## 2019-03-22 DIAGNOSIS — L89610 Pressure ulcer of right heel, unstageable: Secondary | ICD-10-CM | POA: Diagnosis not present

## 2019-03-22 DIAGNOSIS — K760 Fatty (change of) liver, not elsewhere classified: Secondary | ICD-10-CM | POA: Diagnosis not present

## 2019-03-22 DIAGNOSIS — E1142 Type 2 diabetes mellitus with diabetic polyneuropathy: Secondary | ICD-10-CM | POA: Diagnosis not present

## 2019-03-22 DIAGNOSIS — Z7982 Long term (current) use of aspirin: Secondary | ICD-10-CM | POA: Diagnosis not present

## 2019-03-22 DIAGNOSIS — R339 Retention of urine, unspecified: Secondary | ICD-10-CM | POA: Diagnosis not present

## 2019-03-22 DIAGNOSIS — G4733 Obstructive sleep apnea (adult) (pediatric): Secondary | ICD-10-CM | POA: Diagnosis not present

## 2019-03-22 DIAGNOSIS — D509 Iron deficiency anemia, unspecified: Secondary | ICD-10-CM | POA: Diagnosis not present

## 2019-03-22 DIAGNOSIS — R911 Solitary pulmonary nodule: Secondary | ICD-10-CM | POA: Diagnosis not present

## 2019-03-22 DIAGNOSIS — Z794 Long term (current) use of insulin: Secondary | ICD-10-CM | POA: Diagnosis not present

## 2019-03-22 DIAGNOSIS — Z87440 Personal history of urinary (tract) infections: Secondary | ICD-10-CM | POA: Diagnosis not present

## 2019-03-22 DIAGNOSIS — Z9981 Dependence on supplemental oxygen: Secondary | ICD-10-CM | POA: Diagnosis not present

## 2019-03-22 DIAGNOSIS — M159 Polyosteoarthritis, unspecified: Secondary | ICD-10-CM | POA: Diagnosis not present

## 2019-03-22 DIAGNOSIS — E1151 Type 2 diabetes mellitus with diabetic peripheral angiopathy without gangrene: Secondary | ICD-10-CM | POA: Diagnosis not present

## 2019-03-22 DIAGNOSIS — I110 Hypertensive heart disease with heart failure: Secondary | ICD-10-CM | POA: Diagnosis not present

## 2019-03-22 DIAGNOSIS — J449 Chronic obstructive pulmonary disease, unspecified: Secondary | ICD-10-CM | POA: Diagnosis not present

## 2019-03-22 DIAGNOSIS — I5032 Chronic diastolic (congestive) heart failure: Secondary | ICD-10-CM | POA: Diagnosis not present

## 2019-03-22 DIAGNOSIS — E7800 Pure hypercholesterolemia, unspecified: Secondary | ICD-10-CM | POA: Diagnosis not present

## 2019-03-22 DIAGNOSIS — I4510 Unspecified right bundle-branch block: Secondary | ICD-10-CM | POA: Diagnosis not present

## 2019-03-22 DIAGNOSIS — M109 Gout, unspecified: Secondary | ICD-10-CM | POA: Diagnosis not present

## 2019-03-22 DIAGNOSIS — J9612 Chronic respiratory failure with hypercapnia: Secondary | ICD-10-CM | POA: Diagnosis not present

## 2019-03-22 DIAGNOSIS — K219 Gastro-esophageal reflux disease without esophagitis: Secondary | ICD-10-CM | POA: Diagnosis not present

## 2019-03-22 DIAGNOSIS — E785 Hyperlipidemia, unspecified: Secondary | ICD-10-CM | POA: Diagnosis not present

## 2019-03-22 DIAGNOSIS — E46 Unspecified protein-calorie malnutrition: Secondary | ICD-10-CM | POA: Diagnosis not present

## 2019-03-22 DIAGNOSIS — J9611 Chronic respiratory failure with hypoxia: Secondary | ICD-10-CM | POA: Diagnosis not present

## 2019-03-23 DIAGNOSIS — I743 Embolism and thrombosis of arteries of the lower extremities: Secondary | ICD-10-CM | POA: Diagnosis not present

## 2019-03-23 DIAGNOSIS — I70213 Atherosclerosis of native arteries of extremities with intermittent claudication, bilateral legs: Secondary | ICD-10-CM | POA: Diagnosis not present

## 2019-03-23 DIAGNOSIS — E11621 Type 2 diabetes mellitus with foot ulcer: Secondary | ICD-10-CM | POA: Diagnosis not present

## 2019-03-24 DIAGNOSIS — J9611 Chronic respiratory failure with hypoxia: Secondary | ICD-10-CM | POA: Diagnosis not present

## 2019-03-24 DIAGNOSIS — J449 Chronic obstructive pulmonary disease, unspecified: Secondary | ICD-10-CM | POA: Diagnosis not present

## 2019-03-24 DIAGNOSIS — Z794 Long term (current) use of insulin: Secondary | ICD-10-CM | POA: Diagnosis not present

## 2019-03-24 DIAGNOSIS — E1142 Type 2 diabetes mellitus with diabetic polyneuropathy: Secondary | ICD-10-CM | POA: Diagnosis not present

## 2019-03-24 DIAGNOSIS — D509 Iron deficiency anemia, unspecified: Secondary | ICD-10-CM | POA: Diagnosis not present

## 2019-03-24 DIAGNOSIS — E785 Hyperlipidemia, unspecified: Secondary | ICD-10-CM | POA: Diagnosis not present

## 2019-03-24 DIAGNOSIS — M109 Gout, unspecified: Secondary | ICD-10-CM | POA: Diagnosis not present

## 2019-03-24 DIAGNOSIS — E1151 Type 2 diabetes mellitus with diabetic peripheral angiopathy without gangrene: Secondary | ICD-10-CM | POA: Diagnosis not present

## 2019-03-24 DIAGNOSIS — R911 Solitary pulmonary nodule: Secondary | ICD-10-CM | POA: Diagnosis not present

## 2019-03-24 DIAGNOSIS — M159 Polyosteoarthritis, unspecified: Secondary | ICD-10-CM | POA: Diagnosis not present

## 2019-03-24 DIAGNOSIS — J9612 Chronic respiratory failure with hypercapnia: Secondary | ICD-10-CM | POA: Diagnosis not present

## 2019-03-24 DIAGNOSIS — M8580 Other specified disorders of bone density and structure, unspecified site: Secondary | ICD-10-CM | POA: Diagnosis not present

## 2019-03-24 DIAGNOSIS — E46 Unspecified protein-calorie malnutrition: Secondary | ICD-10-CM | POA: Diagnosis not present

## 2019-03-24 DIAGNOSIS — I110 Hypertensive heart disease with heart failure: Secondary | ICD-10-CM | POA: Diagnosis not present

## 2019-03-24 DIAGNOSIS — K760 Fatty (change of) liver, not elsewhere classified: Secondary | ICD-10-CM | POA: Diagnosis not present

## 2019-03-24 DIAGNOSIS — R339 Retention of urine, unspecified: Secondary | ICD-10-CM | POA: Diagnosis not present

## 2019-03-24 DIAGNOSIS — Z87440 Personal history of urinary (tract) infections: Secondary | ICD-10-CM | POA: Diagnosis not present

## 2019-03-24 DIAGNOSIS — I5032 Chronic diastolic (congestive) heart failure: Secondary | ICD-10-CM | POA: Diagnosis not present

## 2019-03-24 DIAGNOSIS — Z9981 Dependence on supplemental oxygen: Secondary | ICD-10-CM | POA: Diagnosis not present

## 2019-03-24 DIAGNOSIS — I4510 Unspecified right bundle-branch block: Secondary | ICD-10-CM | POA: Diagnosis not present

## 2019-03-24 DIAGNOSIS — L89610 Pressure ulcer of right heel, unstageable: Secondary | ICD-10-CM | POA: Diagnosis not present

## 2019-03-24 DIAGNOSIS — E7800 Pure hypercholesterolemia, unspecified: Secondary | ICD-10-CM | POA: Diagnosis not present

## 2019-03-24 DIAGNOSIS — G4733 Obstructive sleep apnea (adult) (pediatric): Secondary | ICD-10-CM | POA: Diagnosis not present

## 2019-03-24 DIAGNOSIS — K219 Gastro-esophageal reflux disease without esophagitis: Secondary | ICD-10-CM | POA: Diagnosis not present

## 2019-03-24 DIAGNOSIS — Z7982 Long term (current) use of aspirin: Secondary | ICD-10-CM | POA: Diagnosis not present

## 2019-03-27 ENCOUNTER — Other Ambulatory Visit (HOSPITAL_COMMUNITY): Payer: Self-pay | Admitting: Interventional Radiology

## 2019-03-27 DIAGNOSIS — J9611 Chronic respiratory failure with hypoxia: Secondary | ICD-10-CM | POA: Diagnosis not present

## 2019-03-27 DIAGNOSIS — I5032 Chronic diastolic (congestive) heart failure: Secondary | ICD-10-CM | POA: Diagnosis not present

## 2019-03-27 DIAGNOSIS — K760 Fatty (change of) liver, not elsewhere classified: Secondary | ICD-10-CM | POA: Diagnosis not present

## 2019-03-27 DIAGNOSIS — I998 Other disorder of circulatory system: Secondary | ICD-10-CM

## 2019-03-27 DIAGNOSIS — R911 Solitary pulmonary nodule: Secondary | ICD-10-CM | POA: Diagnosis not present

## 2019-03-27 DIAGNOSIS — J449 Chronic obstructive pulmonary disease, unspecified: Secondary | ICD-10-CM | POA: Diagnosis not present

## 2019-03-27 DIAGNOSIS — Z87440 Personal history of urinary (tract) infections: Secondary | ICD-10-CM | POA: Diagnosis not present

## 2019-03-27 DIAGNOSIS — I110 Hypertensive heart disease with heart failure: Secondary | ICD-10-CM | POA: Diagnosis not present

## 2019-03-27 DIAGNOSIS — I70229 Atherosclerosis of native arteries of extremities with rest pain, unspecified extremity: Secondary | ICD-10-CM

## 2019-03-27 DIAGNOSIS — E46 Unspecified protein-calorie malnutrition: Secondary | ICD-10-CM | POA: Diagnosis not present

## 2019-03-27 DIAGNOSIS — M159 Polyosteoarthritis, unspecified: Secondary | ICD-10-CM | POA: Diagnosis not present

## 2019-03-27 DIAGNOSIS — Z794 Long term (current) use of insulin: Secondary | ICD-10-CM | POA: Diagnosis not present

## 2019-03-27 DIAGNOSIS — E1151 Type 2 diabetes mellitus with diabetic peripheral angiopathy without gangrene: Secondary | ICD-10-CM | POA: Diagnosis not present

## 2019-03-27 DIAGNOSIS — S91301A Unspecified open wound, right foot, initial encounter: Secondary | ICD-10-CM

## 2019-03-27 DIAGNOSIS — E785 Hyperlipidemia, unspecified: Secondary | ICD-10-CM | POA: Diagnosis not present

## 2019-03-27 DIAGNOSIS — E1142 Type 2 diabetes mellitus with diabetic polyneuropathy: Secondary | ICD-10-CM | POA: Diagnosis not present

## 2019-03-27 DIAGNOSIS — R339 Retention of urine, unspecified: Secondary | ICD-10-CM | POA: Diagnosis not present

## 2019-03-27 DIAGNOSIS — J9621 Acute and chronic respiratory failure with hypoxia: Secondary | ICD-10-CM | POA: Diagnosis not present

## 2019-03-27 DIAGNOSIS — D509 Iron deficiency anemia, unspecified: Secondary | ICD-10-CM | POA: Diagnosis not present

## 2019-03-27 DIAGNOSIS — M8580 Other specified disorders of bone density and structure, unspecified site: Secondary | ICD-10-CM | POA: Diagnosis not present

## 2019-03-27 DIAGNOSIS — E7800 Pure hypercholesterolemia, unspecified: Secondary | ICD-10-CM | POA: Diagnosis not present

## 2019-03-27 DIAGNOSIS — Z9981 Dependence on supplemental oxygen: Secondary | ICD-10-CM | POA: Diagnosis not present

## 2019-03-27 DIAGNOSIS — I739 Peripheral vascular disease, unspecified: Secondary | ICD-10-CM

## 2019-03-27 DIAGNOSIS — M109 Gout, unspecified: Secondary | ICD-10-CM | POA: Diagnosis not present

## 2019-03-27 DIAGNOSIS — L89610 Pressure ulcer of right heel, unstageable: Secondary | ICD-10-CM | POA: Diagnosis not present

## 2019-03-27 DIAGNOSIS — K219 Gastro-esophageal reflux disease without esophagitis: Secondary | ICD-10-CM | POA: Diagnosis not present

## 2019-03-27 DIAGNOSIS — J9612 Chronic respiratory failure with hypercapnia: Secondary | ICD-10-CM | POA: Diagnosis not present

## 2019-03-27 DIAGNOSIS — Z7982 Long term (current) use of aspirin: Secondary | ICD-10-CM | POA: Diagnosis not present

## 2019-03-27 DIAGNOSIS — I4510 Unspecified right bundle-branch block: Secondary | ICD-10-CM | POA: Diagnosis not present

## 2019-03-27 DIAGNOSIS — G4733 Obstructive sleep apnea (adult) (pediatric): Secondary | ICD-10-CM | POA: Diagnosis not present

## 2019-03-28 ENCOUNTER — Encounter (HOSPITAL_COMMUNITY): Payer: Medicare Other

## 2019-03-28 DIAGNOSIS — Z9981 Dependence on supplemental oxygen: Secondary | ICD-10-CM | POA: Diagnosis not present

## 2019-03-28 DIAGNOSIS — R069 Unspecified abnormalities of breathing: Secondary | ICD-10-CM | POA: Diagnosis not present

## 2019-03-28 DIAGNOSIS — R69 Illness, unspecified: Secondary | ICD-10-CM | POA: Diagnosis not present

## 2019-03-28 DIAGNOSIS — R5381 Other malaise: Secondary | ICD-10-CM | POA: Diagnosis not present

## 2019-03-28 DIAGNOSIS — Z743 Need for continuous supervision: Secondary | ICD-10-CM | POA: Diagnosis not present

## 2019-03-28 DIAGNOSIS — R0689 Other abnormalities of breathing: Secondary | ICD-10-CM | POA: Diagnosis not present

## 2019-03-28 DIAGNOSIS — R0602 Shortness of breath: Secondary | ICD-10-CM | POA: Diagnosis not present

## 2019-03-28 DIAGNOSIS — D649 Anemia, unspecified: Secondary | ICD-10-CM | POA: Diagnosis not present

## 2019-03-28 DIAGNOSIS — J441 Chronic obstructive pulmonary disease with (acute) exacerbation: Secondary | ICD-10-CM | POA: Diagnosis not present

## 2019-03-28 DIAGNOSIS — Z03818 Encounter for observation for suspected exposure to other biological agents ruled out: Secondary | ICD-10-CM | POA: Diagnosis not present

## 2019-03-29 ENCOUNTER — Other Ambulatory Visit: Payer: Self-pay | Admitting: *Deleted

## 2019-03-29 ENCOUNTER — Encounter: Payer: Self-pay | Admitting: *Deleted

## 2019-03-29 NOTE — Patient Outreach (Signed)
[facility]) Care Management [place] Telephone Outreach   03/29/2019  Cassandra Barnes 1946-01-19 [id number]  Successful telephone outreach to Cassandra Barnes, 73 y/o female with recent hospitalization at [facility] December 13, 2018 for AMS/ sepsis/ hypoxic respiratory failure related to pneumonia/ UTI; patient was discharged from hospital to SNF for rehabilitation and was apparently recently discharged from SNF back home to self carewith home health services in place. Patient has history including, but not limited to, COPD, on home O2; OSA with CPAP; DM; and morbid obesity.HIPAA/ identity verified with patient during phone call today.  Today, patientagainreports that she "is doingpretty good," and sheimmediately tells me that she had to go to the ER at [facility] yesterday "because I couldn't breathe;" patient states that "nothing was done" for her during ER visit, however, with much effort, I was able to determine that she had been prescribed antibiotics post- ED visit.  Patient reports that she has obtained the antibiotic and is taking as prescribed- medication list in EMR updated per patient report today.  Patient denies pain outside of her "normal" and she denies new/ recent falls; confirms that she remains bed-bound and that her daughter provides daily care for her.  Patient reports "having no breathing problems today, and she sounds to be in no apparent distress throughout entirety of phone call.  Patient further reports: -- attended recent PCP office visit, but does not remember exactly when; states she forgot to ask him about her A1-C levels, and states that because she is on insulin, "it doesn't really matter;" attempted to discuss with patient today, however, she states she doesn't feel much like talking -- home health team continue visiting; had bath aide visit yesterday and expects RN visit "tomorrow;" remains unsure of how long these services  will remain in place -- continues using home O2 as her usual at 2 L/min via oxygenator; denies concerns around oxygen -- attempted to discuss with patient her action plan for episode of SOB yesterday, but al she would say is that she "couldn't breathe so I went to the ER."  Reiterated previously provided education around signs/ symptoms COPD exacerbation/ yellow zone along with corresponding action plan, however, patient shares that her primary action plan for these episodes is 'to call EMS right away;" and she insists that she has "no warning" when the episodes occur, and therefor no choice but to go to hospital -- had a negative corona virus test yesterday in ED, which she is "glad to know" as she reports that her daughter/ caregiver "might have been exposed, but isn't sure."  Reiterated signs/ symptoms coronavirus along with action plan to immediately contact PCP for development of signs/ symptoms, and to seek emergent care should she ever experience difficulty breathing -- denies issues around recent blood sugars at home and reports that she has had no further episodes hypoglycemia since our last conversation.  Daughter reports this morning's blood sugar of "136" however, she states she "doesn't feel like" reviewing with me today.  Both patient and caregiver report that patient's blood sugars at home have all "been in her normal range, between 200-300." -- foot wound continues healing; had recent visit to wound clinic but "not exactly sure" when visit was; continues using established transportation services for transportation to provider appointments -- plans to attend doppler study scheduled for 04/10/2018  Patientdeniesfurther issues, concerns, or problems today. Iconfirmed thatpatienthasmy direct phone number, the main [facility] CM office phone number, and the [facility] CM 24-hour  nurse advice phone number should issues arise prior to next scheduled [place] outreach.  Encouraged patient or  caregiverto contact me directly if needs, questions, issues, or concerns arise prior to next scheduled outreach;sheagreed to do so.  Plan:  Patient will take medications as prescribed and will attend all scheduled provider appointments  Patient will promptly notify care providers for any new concerns/ issues/ problems that arise  Patient will actively participate in home health services as ordered post-hospital discharge  Patient will monitor and record blood sugars 3-4 times per day   [facility] Community CM outreachto continue with scheduled phone call next month  [facility] CM Care Plan Problem Two     Most Recent Value  Care Plan Problem Two  Self-health management of chronic disease state of COPD, as evidenced by recent hospital admission for COPD exacerbation with pneumonia  Role Documenting the Problem Two  Care Management Coordinator  Care Plan for Problem Two  Active  Interventions for Problem Two Long Term Goal   Discussed with patient her recent breathing difficulty and ED visit yesterday,  confirmed that patient was released from ED with oral antibiotics, which she is taking,  medication list updated in EMR according to patient report- patient was encouraged to take antibiotics as prescribed and to contact her care providers promptly for any concerns/ issues/ problems post- recent ED visit,  discussed action plan for COPD exacerbation with patient  [facility] Long Term Goal  Over the next 60 days, patient will be able to verbalize signs/ symptoms COPD yellow zone, along with corresponding action plan for same, as evidenced by patient reporting during [facility] RN CM outreach  [facility] Long Term Goal Start Date  03/01/19    [facility] CM Care Plan Problem Three     Most Recent Value  Care Plan Problem Three  Knowledge deficit related to self-health management of chronic disease state of DM, as evidenced by patient reporting  Role Documenting the Problem Three  Care Management Coordinator  Care Plan for  Problem Three  Active  [facility] Long Term Goal   Over the next 60 days, patient will continue to monitor and record blood sugar values at home, as evidenced by review of same with patient and/ or caregiver  [facility] Long Term Goal Start Date  03/09/19  Interventions for Problem Three Long Term Goal  Confirmed that patient's daughter/ caregiver has continued monitoring and recording blood sugars at home,  confirmed that patient has not experienced any more episodes hypoglycemia as previously reported,  confirmed that patient and daughter have no concerns around recent blood sugar readings at home  [facility] CM Short Term Goal #1   Over the next 20 days, patient will attend PCP office visit and will discuss her recent A1-C values with PCP, as evidenced patient reporting and collaboration with PCP as indicated  [facility] CM Short Term Goal #1 Start Date  03/09/19  [facility] CM Short Term Goal #1 Met Date  03/29/19 [Goal partially met]  Interventions for Short Term Goal #1  Confirmed that patient attended recent virtual visit with PCP,  confirmed that patient did not discuss her A1-C levels with PCP as she was encouraged to do,  reiterated previously provided education around significance of A1-C levels and encouraged patient to consider learning more about her own A1-C trends     Oneta Rack, RN, BSN, [employer] Coordinator [facility] Care Management  [phone]

## 2019-03-30 DIAGNOSIS — E1151 Type 2 diabetes mellitus with diabetic peripheral angiopathy without gangrene: Secondary | ICD-10-CM | POA: Diagnosis not present

## 2019-03-30 DIAGNOSIS — Z9981 Dependence on supplemental oxygen: Secondary | ICD-10-CM | POA: Diagnosis not present

## 2019-03-30 DIAGNOSIS — K760 Fatty (change of) liver, not elsewhere classified: Secondary | ICD-10-CM | POA: Diagnosis not present

## 2019-03-30 DIAGNOSIS — Z794 Long term (current) use of insulin: Secondary | ICD-10-CM | POA: Diagnosis not present

## 2019-03-30 DIAGNOSIS — E7800 Pure hypercholesterolemia, unspecified: Secondary | ICD-10-CM | POA: Diagnosis not present

## 2019-03-30 DIAGNOSIS — L89610 Pressure ulcer of right heel, unstageable: Secondary | ICD-10-CM | POA: Diagnosis not present

## 2019-03-30 DIAGNOSIS — J9612 Chronic respiratory failure with hypercapnia: Secondary | ICD-10-CM | POA: Diagnosis not present

## 2019-03-30 DIAGNOSIS — M8580 Other specified disorders of bone density and structure, unspecified site: Secondary | ICD-10-CM | POA: Diagnosis not present

## 2019-03-30 DIAGNOSIS — J9611 Chronic respiratory failure with hypoxia: Secondary | ICD-10-CM | POA: Diagnosis not present

## 2019-03-30 DIAGNOSIS — R339 Retention of urine, unspecified: Secondary | ICD-10-CM | POA: Diagnosis not present

## 2019-03-30 DIAGNOSIS — M109 Gout, unspecified: Secondary | ICD-10-CM | POA: Diagnosis not present

## 2019-03-30 DIAGNOSIS — E1142 Type 2 diabetes mellitus with diabetic polyneuropathy: Secondary | ICD-10-CM | POA: Diagnosis not present

## 2019-03-30 DIAGNOSIS — R911 Solitary pulmonary nodule: Secondary | ICD-10-CM | POA: Diagnosis not present

## 2019-03-30 DIAGNOSIS — J449 Chronic obstructive pulmonary disease, unspecified: Secondary | ICD-10-CM | POA: Diagnosis not present

## 2019-03-30 DIAGNOSIS — I4510 Unspecified right bundle-branch block: Secondary | ICD-10-CM | POA: Diagnosis not present

## 2019-03-30 DIAGNOSIS — I110 Hypertensive heart disease with heart failure: Secondary | ICD-10-CM | POA: Diagnosis not present

## 2019-03-30 DIAGNOSIS — E785 Hyperlipidemia, unspecified: Secondary | ICD-10-CM | POA: Diagnosis not present

## 2019-03-30 DIAGNOSIS — E46 Unspecified protein-calorie malnutrition: Secondary | ICD-10-CM | POA: Diagnosis not present

## 2019-03-30 DIAGNOSIS — Z87440 Personal history of urinary (tract) infections: Secondary | ICD-10-CM | POA: Diagnosis not present

## 2019-03-30 DIAGNOSIS — Z7982 Long term (current) use of aspirin: Secondary | ICD-10-CM | POA: Diagnosis not present

## 2019-03-30 DIAGNOSIS — K219 Gastro-esophageal reflux disease without esophagitis: Secondary | ICD-10-CM | POA: Diagnosis not present

## 2019-03-30 DIAGNOSIS — G4733 Obstructive sleep apnea (adult) (pediatric): Secondary | ICD-10-CM | POA: Diagnosis not present

## 2019-03-30 DIAGNOSIS — M159 Polyosteoarthritis, unspecified: Secondary | ICD-10-CM | POA: Diagnosis not present

## 2019-03-30 DIAGNOSIS — D509 Iron deficiency anemia, unspecified: Secondary | ICD-10-CM | POA: Diagnosis not present

## 2019-03-30 DIAGNOSIS — I5032 Chronic diastolic (congestive) heart failure: Secondary | ICD-10-CM | POA: Diagnosis not present

## 2019-04-03 DIAGNOSIS — E7800 Pure hypercholesterolemia, unspecified: Secondary | ICD-10-CM | POA: Diagnosis not present

## 2019-04-03 DIAGNOSIS — I110 Hypertensive heart disease with heart failure: Secondary | ICD-10-CM | POA: Diagnosis not present

## 2019-04-03 DIAGNOSIS — E1142 Type 2 diabetes mellitus with diabetic polyneuropathy: Secondary | ICD-10-CM | POA: Diagnosis not present

## 2019-04-03 DIAGNOSIS — E46 Unspecified protein-calorie malnutrition: Secondary | ICD-10-CM | POA: Diagnosis not present

## 2019-04-03 DIAGNOSIS — R339 Retention of urine, unspecified: Secondary | ICD-10-CM | POA: Diagnosis not present

## 2019-04-03 DIAGNOSIS — E785 Hyperlipidemia, unspecified: Secondary | ICD-10-CM | POA: Diagnosis not present

## 2019-04-03 DIAGNOSIS — J449 Chronic obstructive pulmonary disease, unspecified: Secondary | ICD-10-CM | POA: Diagnosis not present

## 2019-04-03 DIAGNOSIS — Z87440 Personal history of urinary (tract) infections: Secondary | ICD-10-CM | POA: Diagnosis not present

## 2019-04-03 DIAGNOSIS — J9612 Chronic respiratory failure with hypercapnia: Secondary | ICD-10-CM | POA: Diagnosis not present

## 2019-04-03 DIAGNOSIS — I509 Heart failure, unspecified: Secondary | ICD-10-CM | POA: Diagnosis not present

## 2019-04-03 DIAGNOSIS — Z794 Long term (current) use of insulin: Secondary | ICD-10-CM | POA: Diagnosis not present

## 2019-04-03 DIAGNOSIS — M8580 Other specified disorders of bone density and structure, unspecified site: Secondary | ICD-10-CM | POA: Diagnosis not present

## 2019-04-03 DIAGNOSIS — I5032 Chronic diastolic (congestive) heart failure: Secondary | ICD-10-CM | POA: Diagnosis not present

## 2019-04-03 DIAGNOSIS — I4510 Unspecified right bundle-branch block: Secondary | ICD-10-CM | POA: Diagnosis not present

## 2019-04-03 DIAGNOSIS — E1151 Type 2 diabetes mellitus with diabetic peripheral angiopathy without gangrene: Secondary | ICD-10-CM | POA: Diagnosis not present

## 2019-04-03 DIAGNOSIS — M109 Gout, unspecified: Secondary | ICD-10-CM | POA: Diagnosis not present

## 2019-04-03 DIAGNOSIS — L89610 Pressure ulcer of right heel, unstageable: Secondary | ICD-10-CM | POA: Diagnosis not present

## 2019-04-03 DIAGNOSIS — K760 Fatty (change of) liver, not elsewhere classified: Secondary | ICD-10-CM | POA: Diagnosis not present

## 2019-04-03 DIAGNOSIS — Z9981 Dependence on supplemental oxygen: Secondary | ICD-10-CM | POA: Diagnosis not present

## 2019-04-03 DIAGNOSIS — M159 Polyosteoarthritis, unspecified: Secondary | ICD-10-CM | POA: Diagnosis not present

## 2019-04-03 DIAGNOSIS — I739 Peripheral vascular disease, unspecified: Secondary | ICD-10-CM | POA: Diagnosis not present

## 2019-04-03 DIAGNOSIS — R911 Solitary pulmonary nodule: Secondary | ICD-10-CM | POA: Diagnosis not present

## 2019-04-03 DIAGNOSIS — D509 Iron deficiency anemia, unspecified: Secondary | ICD-10-CM | POA: Diagnosis not present

## 2019-04-03 DIAGNOSIS — J9611 Chronic respiratory failure with hypoxia: Secondary | ICD-10-CM | POA: Diagnosis not present

## 2019-04-03 DIAGNOSIS — K219 Gastro-esophageal reflux disease without esophagitis: Secondary | ICD-10-CM | POA: Diagnosis not present

## 2019-04-03 DIAGNOSIS — G4733 Obstructive sleep apnea (adult) (pediatric): Secondary | ICD-10-CM | POA: Diagnosis not present

## 2019-04-03 DIAGNOSIS — Z7982 Long term (current) use of aspirin: Secondary | ICD-10-CM | POA: Diagnosis not present

## 2019-04-04 DIAGNOSIS — I4510 Unspecified right bundle-branch block: Secondary | ICD-10-CM | POA: Diagnosis not present

## 2019-04-04 DIAGNOSIS — J449 Chronic obstructive pulmonary disease, unspecified: Secondary | ICD-10-CM | POA: Diagnosis not present

## 2019-04-04 DIAGNOSIS — R339 Retention of urine, unspecified: Secondary | ICD-10-CM | POA: Diagnosis not present

## 2019-04-04 DIAGNOSIS — D509 Iron deficiency anemia, unspecified: Secondary | ICD-10-CM | POA: Diagnosis not present

## 2019-04-04 DIAGNOSIS — K760 Fatty (change of) liver, not elsewhere classified: Secondary | ICD-10-CM | POA: Diagnosis not present

## 2019-04-04 DIAGNOSIS — J9612 Chronic respiratory failure with hypercapnia: Secondary | ICD-10-CM | POA: Diagnosis not present

## 2019-04-04 DIAGNOSIS — E7800 Pure hypercholesterolemia, unspecified: Secondary | ICD-10-CM | POA: Diagnosis not present

## 2019-04-04 DIAGNOSIS — E1142 Type 2 diabetes mellitus with diabetic polyneuropathy: Secondary | ICD-10-CM | POA: Diagnosis not present

## 2019-04-04 DIAGNOSIS — R911 Solitary pulmonary nodule: Secondary | ICD-10-CM | POA: Diagnosis not present

## 2019-04-04 DIAGNOSIS — M159 Polyosteoarthritis, unspecified: Secondary | ICD-10-CM | POA: Diagnosis not present

## 2019-04-04 DIAGNOSIS — G4733 Obstructive sleep apnea (adult) (pediatric): Secondary | ICD-10-CM | POA: Diagnosis not present

## 2019-04-04 DIAGNOSIS — Z794 Long term (current) use of insulin: Secondary | ICD-10-CM | POA: Diagnosis not present

## 2019-04-04 DIAGNOSIS — E46 Unspecified protein-calorie malnutrition: Secondary | ICD-10-CM | POA: Diagnosis not present

## 2019-04-04 DIAGNOSIS — I5032 Chronic diastolic (congestive) heart failure: Secondary | ICD-10-CM | POA: Diagnosis not present

## 2019-04-04 DIAGNOSIS — J9611 Chronic respiratory failure with hypoxia: Secondary | ICD-10-CM | POA: Diagnosis not present

## 2019-04-04 DIAGNOSIS — E785 Hyperlipidemia, unspecified: Secondary | ICD-10-CM | POA: Diagnosis not present

## 2019-04-04 DIAGNOSIS — L89610 Pressure ulcer of right heel, unstageable: Secondary | ICD-10-CM | POA: Diagnosis not present

## 2019-04-04 DIAGNOSIS — M8580 Other specified disorders of bone density and structure, unspecified site: Secondary | ICD-10-CM | POA: Diagnosis not present

## 2019-04-04 DIAGNOSIS — Z7982 Long term (current) use of aspirin: Secondary | ICD-10-CM | POA: Diagnosis not present

## 2019-04-04 DIAGNOSIS — E118 Type 2 diabetes mellitus with unspecified complications: Secondary | ICD-10-CM | POA: Diagnosis not present

## 2019-04-04 DIAGNOSIS — K219 Gastro-esophageal reflux disease without esophagitis: Secondary | ICD-10-CM | POA: Diagnosis not present

## 2019-04-04 DIAGNOSIS — M109 Gout, unspecified: Secondary | ICD-10-CM | POA: Diagnosis not present

## 2019-04-04 DIAGNOSIS — Z9981 Dependence on supplemental oxygen: Secondary | ICD-10-CM | POA: Diagnosis not present

## 2019-04-04 DIAGNOSIS — E1151 Type 2 diabetes mellitus with diabetic peripheral angiopathy without gangrene: Secondary | ICD-10-CM | POA: Diagnosis not present

## 2019-04-04 DIAGNOSIS — I110 Hypertensive heart disease with heart failure: Secondary | ICD-10-CM | POA: Diagnosis not present

## 2019-04-04 DIAGNOSIS — D649 Anemia, unspecified: Secondary | ICD-10-CM | POA: Diagnosis not present

## 2019-04-04 DIAGNOSIS — Z87440 Personal history of urinary (tract) infections: Secondary | ICD-10-CM | POA: Diagnosis not present

## 2019-04-05 DIAGNOSIS — I5032 Chronic diastolic (congestive) heart failure: Secondary | ICD-10-CM | POA: Diagnosis not present

## 2019-04-05 DIAGNOSIS — I4510 Unspecified right bundle-branch block: Secondary | ICD-10-CM | POA: Diagnosis not present

## 2019-04-05 DIAGNOSIS — J449 Chronic obstructive pulmonary disease, unspecified: Secondary | ICD-10-CM | POA: Diagnosis not present

## 2019-04-05 DIAGNOSIS — E1151 Type 2 diabetes mellitus with diabetic peripheral angiopathy without gangrene: Secondary | ICD-10-CM | POA: Diagnosis not present

## 2019-04-05 DIAGNOSIS — M109 Gout, unspecified: Secondary | ICD-10-CM | POA: Diagnosis not present

## 2019-04-05 DIAGNOSIS — Z794 Long term (current) use of insulin: Secondary | ICD-10-CM | POA: Diagnosis not present

## 2019-04-05 DIAGNOSIS — R911 Solitary pulmonary nodule: Secondary | ICD-10-CM | POA: Diagnosis not present

## 2019-04-05 DIAGNOSIS — E785 Hyperlipidemia, unspecified: Secondary | ICD-10-CM | POA: Diagnosis not present

## 2019-04-05 DIAGNOSIS — L89610 Pressure ulcer of right heel, unstageable: Secondary | ICD-10-CM | POA: Diagnosis not present

## 2019-04-05 DIAGNOSIS — J9612 Chronic respiratory failure with hypercapnia: Secondary | ICD-10-CM | POA: Diagnosis not present

## 2019-04-05 DIAGNOSIS — E1142 Type 2 diabetes mellitus with diabetic polyneuropathy: Secondary | ICD-10-CM | POA: Diagnosis not present

## 2019-04-05 DIAGNOSIS — E46 Unspecified protein-calorie malnutrition: Secondary | ICD-10-CM | POA: Diagnosis not present

## 2019-04-05 DIAGNOSIS — I110 Hypertensive heart disease with heart failure: Secondary | ICD-10-CM | POA: Diagnosis not present

## 2019-04-05 DIAGNOSIS — D509 Iron deficiency anemia, unspecified: Secondary | ICD-10-CM | POA: Diagnosis not present

## 2019-04-05 DIAGNOSIS — M159 Polyosteoarthritis, unspecified: Secondary | ICD-10-CM | POA: Diagnosis not present

## 2019-04-05 DIAGNOSIS — E7800 Pure hypercholesterolemia, unspecified: Secondary | ICD-10-CM | POA: Diagnosis not present

## 2019-04-05 DIAGNOSIS — J9611 Chronic respiratory failure with hypoxia: Secondary | ICD-10-CM | POA: Diagnosis not present

## 2019-04-05 DIAGNOSIS — G4733 Obstructive sleep apnea (adult) (pediatric): Secondary | ICD-10-CM | POA: Diagnosis not present

## 2019-04-05 DIAGNOSIS — Z7982 Long term (current) use of aspirin: Secondary | ICD-10-CM | POA: Diagnosis not present

## 2019-04-05 DIAGNOSIS — Z87440 Personal history of urinary (tract) infections: Secondary | ICD-10-CM | POA: Diagnosis not present

## 2019-04-05 DIAGNOSIS — K219 Gastro-esophageal reflux disease without esophagitis: Secondary | ICD-10-CM | POA: Diagnosis not present

## 2019-04-05 DIAGNOSIS — M8580 Other specified disorders of bone density and structure, unspecified site: Secondary | ICD-10-CM | POA: Diagnosis not present

## 2019-04-05 DIAGNOSIS — K760 Fatty (change of) liver, not elsewhere classified: Secondary | ICD-10-CM | POA: Diagnosis not present

## 2019-04-05 DIAGNOSIS — Z9981 Dependence on supplemental oxygen: Secondary | ICD-10-CM | POA: Diagnosis not present

## 2019-04-05 DIAGNOSIS — R339 Retention of urine, unspecified: Secondary | ICD-10-CM | POA: Diagnosis not present

## 2019-04-08 DIAGNOSIS — J449 Chronic obstructive pulmonary disease, unspecified: Secondary | ICD-10-CM | POA: Diagnosis not present

## 2019-04-10 ENCOUNTER — Other Ambulatory Visit: Payer: Self-pay | Admitting: Radiology

## 2019-04-10 DIAGNOSIS — L97412 Non-pressure chronic ulcer of right heel and midfoot with fat layer exposed: Secondary | ICD-10-CM | POA: Diagnosis not present

## 2019-04-10 DIAGNOSIS — L89613 Pressure ulcer of right heel, stage 3: Secondary | ICD-10-CM | POA: Diagnosis not present

## 2019-04-10 DIAGNOSIS — E11628 Type 2 diabetes mellitus with other skin complications: Secondary | ICD-10-CM | POA: Diagnosis not present

## 2019-04-10 DIAGNOSIS — E11621 Type 2 diabetes mellitus with foot ulcer: Secondary | ICD-10-CM | POA: Diagnosis not present

## 2019-04-11 ENCOUNTER — Other Ambulatory Visit (HOSPITAL_COMMUNITY): Payer: Self-pay | Admitting: Interventional Radiology

## 2019-04-11 ENCOUNTER — Other Ambulatory Visit: Payer: Self-pay

## 2019-04-11 ENCOUNTER — Encounter (HOSPITAL_COMMUNITY): Payer: Self-pay

## 2019-04-11 ENCOUNTER — Ambulatory Visit (HOSPITAL_COMMUNITY)
Admission: RE | Admit: 2019-04-11 | Discharge: 2019-04-11 | Disposition: A | Discharge status: Home / Self Care | Payer: Medicare Other | Source: Ambulatory Visit | Attending: Interventional Radiology | Admitting: Interventional Radiology

## 2019-04-11 DIAGNOSIS — J449 Chronic obstructive pulmonary disease, unspecified: Secondary | ICD-10-CM | POA: Diagnosis not present

## 2019-04-11 DIAGNOSIS — E1151 Type 2 diabetes mellitus with diabetic peripheral angiopathy without gangrene: Secondary | ICD-10-CM | POA: Insufficient documentation

## 2019-04-11 DIAGNOSIS — S91301A Unspecified open wound, right foot, initial encounter: Secondary | ICD-10-CM

## 2019-04-11 DIAGNOSIS — Z87891 Personal history of nicotine dependence: Secondary | ICD-10-CM | POA: Diagnosis not present

## 2019-04-11 DIAGNOSIS — I509 Heart failure, unspecified: Secondary | ICD-10-CM | POA: Insufficient documentation

## 2019-04-11 DIAGNOSIS — I70229 Atherosclerosis of native arteries of extremities with rest pain, unspecified extremity: Secondary | ICD-10-CM

## 2019-04-11 DIAGNOSIS — E11621 Type 2 diabetes mellitus with foot ulcer: Secondary | ICD-10-CM | POA: Diagnosis not present

## 2019-04-11 DIAGNOSIS — L97419 Non-pressure chronic ulcer of right heel and midfoot with unspecified severity: Secondary | ICD-10-CM | POA: Diagnosis not present

## 2019-04-11 DIAGNOSIS — Z79899 Other long term (current) drug therapy: Secondary | ICD-10-CM | POA: Insufficient documentation

## 2019-04-11 DIAGNOSIS — I998 Other disorder of circulatory system: Secondary | ICD-10-CM

## 2019-04-11 DIAGNOSIS — I110 Hypertensive heart disease with heart failure: Secondary | ICD-10-CM | POA: Diagnosis not present

## 2019-04-11 DIAGNOSIS — I739 Peripheral vascular disease, unspecified: Secondary | ICD-10-CM

## 2019-04-11 DIAGNOSIS — R6889 Other general symptoms and signs: Secondary | ICD-10-CM | POA: Diagnosis not present

## 2019-04-11 DIAGNOSIS — I70212 Atherosclerosis of native arteries of extremities with intermittent claudication, left leg: Secondary | ICD-10-CM | POA: Diagnosis not present

## 2019-04-11 DIAGNOSIS — Z7982 Long term (current) use of aspirin: Secondary | ICD-10-CM | POA: Insufficient documentation

## 2019-04-11 DIAGNOSIS — Z794 Long term (current) use of insulin: Secondary | ICD-10-CM | POA: Insufficient documentation

## 2019-04-11 HISTORY — PX: IR ANGIOGRAM EXTREMITY BILATERAL: IMG653

## 2019-04-11 HISTORY — PX: IR FEM POP ART PTA MOD SED: IMG2309

## 2019-04-11 HISTORY — PX: IR US GUIDE VASC ACCESS RIGHT: IMG2390

## 2019-04-11 HISTORY — DX: Heart failure, unspecified: I50.9

## 2019-04-11 HISTORY — DX: Essential (primary) hypertension: I10

## 2019-04-11 LAB — CBC
HCT: 31.8 % — ABNORMAL LOW (ref 36.0–46.0)
Hemoglobin: 8.7 g/dL — ABNORMAL LOW (ref 12.0–15.0)
MCH: 28 pg (ref 26.0–34.0)
MCHC: 27.4 g/dL — ABNORMAL LOW (ref 30.0–36.0)
MCV: 102.3 fL — ABNORMAL HIGH (ref 80.0–100.0)
Platelets: 180 10*3/uL (ref 150–400)
RBC: 3.11 MIL/uL — ABNORMAL LOW (ref 3.87–5.11)
RDW: 16.7 % — ABNORMAL HIGH (ref 11.5–15.5)
WBC: 7.1 10*3/uL (ref 4.0–10.5)
nRBC: 0 % (ref 0.0–0.2)

## 2019-04-11 LAB — BASIC METABOLIC PANEL WITH GFR
Anion gap: 10 (ref 5–15)
BUN: 17 mg/dL (ref 8–23)
CO2: 35 mmol/L — ABNORMAL HIGH (ref 22–32)
Calcium: 10.2 mg/dL (ref 8.9–10.3)
Chloride: 97 mmol/L — ABNORMAL LOW (ref 98–111)
Creatinine, Ser: 0.78 mg/dL (ref 0.44–1.00)
GFR calc Af Amer: >60 mL/min
GFR calc non Af Amer: >60 mL/min
Glucose, Bld: 139 mg/dL — ABNORMAL HIGH (ref 70–99)
Potassium: 4.7 mmol/L (ref 3.5–5.1)
Sodium: 142 mmol/L (ref 135–145)

## 2019-04-11 LAB — PROTIME-INR
INR: 1.1 (ref 0.8–1.2)
Prothrombin Time: 13.6 s (ref 11.4–15.2)

## 2019-04-11 LAB — GLUCOSE, CAPILLARY: Glucose-Capillary: 90 mg/dL (ref 70–99)

## 2019-04-11 MED ORDER — HEPARIN 1000 UNIT/ML FOR PERITONEAL DIALYSIS
INTRAMUSCULAR | Status: AC | PRN
  Administered 2019-04-11: 10000 [IU] via INTRAVENOUS

## 2019-04-11 MED ORDER — PROTAMINE SULFATE 10 MG/ML IV SOLN
INTRAVENOUS | Status: AC | PRN
  Administered 2019-04-11: 50 mg via INTRAVENOUS

## 2019-04-11 MED ORDER — IODIXANOL 320 MG/ML IV SOLN
100.0000 mL | Freq: Once | INTRAVENOUS | Status: AC | PRN
  Administered 2019-04-11: 80 mL via INTRA_ARTERIAL

## 2019-04-11 MED ORDER — HEPARIN SODIUM (PORCINE) 1000 UNIT/ML IJ SOLN
INTRAMUSCULAR | Status: AC
  Filled 2019-04-11: qty 1

## 2019-04-11 MED ORDER — LIDOCAINE HCL 1 % IJ SOLN
INTRAMUSCULAR | Status: AC | PRN
  Administered 2019-04-11: 5 mL

## 2019-04-11 MED ORDER — IOHEXOL 300 MG/ML  SOLN
50.0000 mL | Freq: Once | INTRAMUSCULAR | Status: AC | PRN
  Administered 2019-04-11: 16 mL via INTRA_ARTERIAL

## 2019-04-11 MED ORDER — FENTANYL CITRATE (PF) 100 MCG/2ML IJ SOLN
INTRAMUSCULAR | Status: AC | PRN
  Administered 2019-04-11: 50 ug via INTRAVENOUS
  Administered 2019-04-11: 25 ug via INTRAVENOUS

## 2019-04-11 MED ORDER — PROTAMINE SULFATE 10 MG/ML IV SOLN
50.0000 mg | Freq: Once | INTRAVENOUS | Status: DC
  Filled 2019-04-11: qty 5

## 2019-04-11 MED ORDER — ASPIRIN EC 325 MG PO TBEC
DELAYED_RELEASE_TABLET | ORAL | Status: AC
  Administered 2019-04-11: 650 mg
  Filled 2019-04-11: qty 2

## 2019-04-11 MED ORDER — CLOPIDOGREL BISULFATE 300 MG PO TABS
ORAL_TABLET | ORAL | Status: AC
  Filled 2019-04-11: qty 1

## 2019-04-11 MED ORDER — FENTANYL CITRATE (PF) 100 MCG/2ML IJ SOLN
INTRAMUSCULAR | Status: AC
  Filled 2019-04-11: qty 2

## 2019-04-11 MED ORDER — ASPIRIN 325 MG PO TABS: 650.0000 mg | ORAL_TABLET | Freq: Every day | ORAL | Status: DC

## 2019-04-11 MED ORDER — LIDOCAINE HCL 1 % IJ SOLN
INTRAMUSCULAR | Status: AC
  Filled 2019-04-11: qty 20

## 2019-04-11 MED ORDER — CLOPIDOGREL BISULFATE 75 MG PO TABS
300.0000 mg | ORAL_TABLET | Freq: Once | ORAL | Status: AC
  Administered 2019-04-11: 300 mg via ORAL

## 2019-04-11 NOTE — Progress Notes (Signed)
Right heel wound dressed with 4X4's and cling.  Pt states home health nurse will redress on Thursday.

## 2019-04-11 NOTE — Discharge Instructions (Signed)
Angiogram, Care After This sheet gives you information about how to care for yourself after your procedure. Your health care provider may also give you more specific instructions. If you have problems or questions, contact your health care provider. What can I expect after the procedure? After the procedure, it is common to have bruising and tenderness at the catheter insertion area. Follow these instructions at home: Insertion site care  Follow instructions from your health care provider about how to take care of your insertion site. Make sure you: ? Wash your hands with soap and water before you change your bandage (dressing). If soap and water are not available, use hand sanitizer. ? Change your dressing as told by your health care provider. ? Leave stitches (sutures), skin glue, or adhesive strips in place. These skin closures may need to stay in place for 2 weeks or longer. If adhesive strip edges start to loosen and curl up, you may trim the loose edges. Do not remove adhesive strips completely unless your health care provider tells you to do that.  Do not take baths, swim, or use a hot tub until your health care provider approves.  You may shower 24-48 hours after the procedure or as told by your health care provider. ? Gently wash the site with plain soap and water. ? Pat the area dry with a clean towel. ? Do not rub the site. This may cause bleeding.  Do not apply powder or lotion to the site. Keep the site clean and dry.  Check your insertion site every day for signs of infection. Check for: ? Redness, swelling, or pain. ? Fluid or blood. ? Warmth. ? Pus or a bad smell. Activity  Rest as told by your health care provider, usually for 1-2 days.  Do not lift anything that is heavier than 10 lbs. (4.5 kg) or as told by your health care provider.  Do not drive for 24 hours if you were given a medicine to help you relax (sedative).  Do not drive or use heavy machinery while  taking prescription pain medicine. General instructions   Return to your normal activities as told by your health care provider, usually in about a week. Ask your health care provider what activities are safe for you.  If the catheter site starts bleeding, lie flat and put pressure on the site. If the bleeding does not stop, get help right away. This is a medical emergency.  Drink enough fluid to keep your urine clear or pale yellow. This helps flush the contrast dye from your body.  Take over-the-counter and prescription medicines only as told by your health care provider.  Keep all follow-up visits as told by your health care provider. This is important. Contact a health care provider if:  You have a fever or chills.  You have redness, swelling, or pain around your insertion site.  You have fluid or blood coming from your insertion site.  The insertion site feels warm to the touch.  You have pus or a bad smell coming from your insertion site.  You have bruising around the insertion site.  You notice blood collecting in the tissue around the catheter site (hematoma). The hematoma may be painful to the touch. Get help right away if:  You have severe pain at the catheter insertion area.  The catheter insertion area swells very fast.  The catheter insertion area is bleeding, and the bleeding does not stop when you hold steady pressure on the area.    The area near or just beyond the catheter insertion site becomes pale, cool, tingly, or numb. These symptoms may represent a serious problem that is an emergency. Do not wait to see if the symptoms will go away. Get medical help right away. Call your local emergency services (911 in the [place] Do not drive yourself to the hospital. Summary  After the procedure, it is common to have bruising and tenderness at the catheter insertion area.  After the procedure, it is important to rest and drink plenty of fluids.  Do not take baths,  swim, or use a hot tub until your health care provider says it is okay to do so. You may shower 24-48 hours after the procedure or as told by your health care provider.  If the catheter site starts bleeding, lie flat and put pressure on the site. If the bleeding does not stop, get help right away. This is a medical emergency. This information is not intended to replace advice given to you by your health care provider. Make sure you discuss any questions you have with your health care provider. Document Revised: 03/05/2017 Document Reviewed: 02/26/2016 Elsevier Patient Education  2020 [employer]  

## 2019-04-11 NOTE — H&P (Signed)
Chief Complaint: Patient was seen in consultation today for Aorto peripheral arteriogram with possible RLE intervention at the request of Dr Brynda Greathouse   Supervising Physician: Corrie Mckusick  Patient Status: [facility] - Out-pt  History of Present Illness: Cassandra Barnes is a 74 y.o. female   Pt was referred to Dr Earleen Newport for evaluation and possible treatment for RLE arterial disease Non healing Rt heel wound ~4-5 months  CTA Multi-level disease of the right lower extremity, with athero of the right iliac, fem-pop, and tibial arterial disease. Disease within the right femoropopliteal system includes proximal SFA disease, mid segment and distal SFA disease. Left-sided disease includes iliac disease, mild femoropopliteal disease, and tibial disease. Aortic disease without aneurysm.  03/23/19 consult:  ASSESSMENT AND PLAN: Cassandra Barnes is a 74 yo female with right lower extremity CLI/CLTI, [place] 6 of the posterior right foot. I had an extensive discussion with her and her daughter regarding peripheral arterial disease, natural history, anatomy, physiology/ pathophysiology, and treatment options. As a primary therapy, I emphasized continuing Medical Care with modifying risk factors as the foundation for treatment, including smoking cessation.  Given her non-invasive study demonstrating compromised right ABI at rest, and the CT demonstrating multi segment disease, I did share with them the possibility that Cassandra Barnes would need multi segment treatment, to improve the blood flow to the right foot. Given the appearance of the wound in the compromised ABI, I feel that Cassandra Barnes has at high risk for amputation if this is treated conservatively with medical and wound care alone.  Regarding procedural treatment, I discussed surgical options versus endovascular options. I believe Cassandra Barnes is a candidate for endovascular therapy. I discussed specific risks of our procedure including bleeding,  infection, kidney injury, contrast reaction, need for further procedure/ surgery, arterial injury/dissection, limb loss, cardiopulmonary collapse, death. I discussed goals of treatment, specifically restoring blood flow, optimize and wound healing capability, for limb salvage purpose and maintaining quality of life. Her own goals include potentially improving her ambulation after wound healing has occurred and Cassandra Barnes can start weight-bearing again. They would like to proceed with endovascular therapy, goals of limb salvage. Plan -plan for aorto peripheral angiogram and possible intervention targeting the right lower extremity.  Pt now scheduled for aortoperipheral arteriogram with possible RLE intervention   Past Medical History:  Diagnosis Date  . CHF (congestive heart failure) ([place])   . COPD (chronic obstructive pulmonary disease) ([place])   . Diabetes mellitus without complication ([place])   . Hypertension   . Oxygen deficiency     Past Surgical History:  Procedure Laterality Date  . ABDOMINAL HYSTERECTOMY    . CHOLECYSTECTOMY      Allergies: Codeine  Medications: Prior to Admission medications   Medication Sig Start Date End Date Taking? Authorizing Provider  acetaminophen (TYLENOL) 325 MG tablet Take 650 mg by mouth every 6 (six) hours as needed.   Yes Cher Nakai, MD  allopurinol (ZYLOPRIM) 100 MG tablet Take 100 mg by mouth daily.   Yes Cher Nakai, MD  aspirin EC 81 MG tablet Take 81 mg by mouth daily.   Yes [provider]  colchicine 0.6 MG tablet  06/18/15  Yes [provider]  Febuxostat (ULORIC) 80 MG TABS  06/19/15  Yes [provider]  ferrous sulfate 325 (65 FE) MG EC tablet Take 325 mg by mouth daily with breakfast.   Yes Cher Nakai, MD  furosemide (LASIX) 80 MG tablet  05/27/15  Yes [provider]  gabapentin (NEURONTIN) 600  MG tablet Take 600 mg by mouth 3 (three) times daily.   Yes [provider]  insulin glargine  (LANTUS) 100 UNIT/ML injection Inject 38 Units into the skin 2 (two) times daily. Daughter reports PCP prescribed Lantus pen, 38 U BID   Yes Cher Nakai, MD  insulin regular (NOVOLIN R) 100 units/mL injection Inject 8 Units into the skin 3 (three) times daily before meals. Daughter reports taking TIC/ AC; uses sliding scale 0-8 U according to PCP instructions   Yes Cher Nakai, MD  meloxicam (MOBIC) 15 MG tablet Take 15 mg by mouth daily.   Yes Cher Nakai, MD  metFORMIN (GLUCOPHAGE) 1000 MG tablet TAKE 1 TABLET BY MOUTH TWICE DAILY 07/23/16  Yes [provider]  metoprolol succinate (TOPROL-XL) 25 MG 24 hr tablet Take 25 mg by mouth daily.   Yes Cher Nakai, MD  Multiple Vitamin (MULTIVITAMIN WITH MINERALS) TABS tablet Take 1 tablet by mouth daily.   Yes Cher Nakai, MD  nystatin (MYCOSTATIN/NYSTOP) powder Apply topically 4 (four) times daily.   Yes Cher Nakai, MD  omeprazole (PRILOSEC) 20 MG capsule Take 20 mg by mouth daily.   Yes [provider]  pantoprazole (PROTONIX) 40 MG tablet Take 40 mg by mouth daily.   Yes Cher Nakai, MD  promethazine (PHENERGAN) 25 MG tablet Take 25 mg by mouth every 6 (six) hours as needed for nausea or vomiting.   Yes [provider]  spironolactone (ALDACTONE) 25 MG tablet  06/19/15  Yes [provider]  torsemide (DEMADEX) 20 MG tablet Take 20 mg by mouth daily. Daughter reports taking (2) 20 mg (for total dose of 40 mg) po QD   Yes Cher Nakai, MD  docusate sodium (COLACE) 100 MG capsule Take 100 mg by mouth daily. Daughter reports taking OTC stool softener 100 mg QD per PCP instructions    Cher Nakai, MD  docusate sodium (COLACE) 250 MG capsule Take 250 mg by mouth daily.    [provider]  levofloxacin (LEVAQUIN) 500 MG tablet Take 500 mg by mouth daily. Take every day x 10 days- prescribed at time of ED visit at [facility] 03/26/2019    Cher Nakai, MD  ondansetron (ZOFRAN) 4 MG tablet Take 4 mg by mouth every 8 (eight)  hours as needed for nausea or vomiting.    Cher Nakai, MD     Family History  Problem Relation Age of Onset  . Diabetes Mother   . Heart disease Mother   . Heart disease Father     Social History   Socioeconomic History  . Marital status: Married    Spouse name: Not on file  . Number of children: Not on file  . Years of education: Not on file  . Highest education level: Not on file  Occupational History  . Not on file  Tobacco Use  . Smoking status: Former Smoker    Quit date: 04/10/2013    Years since quitting: 6.0  . Smokeless tobacco: Never Used  Substance and Sexual Activity  . Alcohol use: Never  . Drug use: Never  . Sexual activity: Not on file  Other Topics Concern  . Not on file  Social History Narrative  . Not on file   Social Determinants of Health   Financial Resource Strain: Low Risk   . Difficulty of Paying Living Expenses: Not very hard  Food Insecurity: No Food Insecurity  . Worried About Charity fundraiser in the Last Year: Never true  .  Ran Out of Food in the Last Year: Never true  Transportation Needs: No Transportation Needs  . Lack of Transportation (Medical): No  . Lack of Transportation (Non-Medical): No  Physical Activity:   . Days of Exercise per Week: Not on file  . Minutes of Exercise per Session: Not on file  Stress:   . Feeling of Stress : Not on file  Social Connections:   . Frequency of Communication with Friends and Family: Not on file  . Frequency of Social Gatherings with Friends and Family: Not on file  . Attends Religious Services: Not on file  . Active Member of Clubs or Organizations: Not on file  . Attends Archivist Meetings: Not on file  . Marital Status: Not on file    Review of Systems: A 12 point ROS discussed and pertinent positives are indicated in the HPI above.  All other systems are negative.  Review of Systems  Constitutional: Positive for activity change and fatigue. Negative for fever.    Respiratory: Negative for cough and shortness of breath.   Cardiovascular: Negative for chest pain.  Gastrointestinal: Negative for abdominal pain.  Musculoskeletal: Positive for gait problem.  Neurological: Positive for weakness.  Psychiatric/Behavioral: Negative for behavioral problems and confusion.    Vital Signs: BP (!) 129/106   Pulse 84   Temp 98.2 F (36.8 C) (Oral)   Resp 18   Ht (P) 5\' 8"  (1.727 m)   SpO2 100%   BMI (P) 41.05 kg/m   Physical Exam Vitals reviewed.  Cardiovascular:     Rate and Rhythm: Normal rate and regular rhythm.     Heart sounds: Normal heart sounds.  Pulmonary:     Effort: Pulmonary effort is normal.     Breath sounds: Normal breath sounds.  Abdominal:     Palpations: Abdomen is soft.  Musculoskeletal:     Right lower leg: Edema present.     Left lower leg: Edema present.  Skin:    General: Skin is warm.     Comments: Skin sloughing Bilat Lowe extremities - from knee down RLE- ankle/foot wrapped  Neurological:     Mental Status: Cassandra Barnes is alert.     Imaging: No results found.  Labs:  CBC: Recent Labs    04/11/19 0630  WBC 7.1  HGB 8.7*  HCT 31.8*  PLT 180    COAGS: No results for input(s): INR, APTT in the last 8760 hours.  BMP: No results for input(s): NA, K, CL, CO2, GLUCOSE, BUN, CALCIUM, CREATININE, GFRNONAA, GFRAA in the last 8760 hours.  Invalid input(s): CMP  LIVER FUNCTION TESTS: No results for input(s): BILITOT, AST, ALT, ALKPHOS, PROT, ALBUMIN in the last 8760 hours.  TUMOR MARKERS: No results for input(s): AFPTM, CEA, CA199, CHROMGRNA in the last 8760 hours.  Assessment and Plan:  Rt heel wound 4-5 months-- non healing Imaging and doppler revealing compromised flow Scheduled now for aortoperipheral arteriogram with possible intervention Risks and benefits of aortoperipheral arteriogram with possible intervention were discussed with the patient including, but not limited to bleeding, infection,  vascular injury or contrast induced renal failure.  This interventional procedure involves the use of X-rays and because of the nature of the planned procedure, it is possible that we will have prolonged use of X-ray fluoroscopy.  Potential radiation risks to you include (but are not limited to) the following: - A slightly elevated risk for cancer  several years later in life. This risk is typically less than 0.5% percent. This  risk is low in comparison to the normal incidence of human cancer, which is 33% for women and 50% for men according to the [employer]. - Radiation induced injury can include skin redness, resembling a rash, tissue breakdown / ulcers and hair loss (which can be temporary or permanent).   The likelihood of either of these occurring depends on the difficulty of the procedure and whether you are sensitive to radiation due to previous procedures, disease, or genetic conditions.   IF your procedure requires a prolonged use of radiation, you will be notified and given written instructions for further action.  It is your responsibility to monitor the irradiated area for the 2 weeks following the procedure and to notify your physician if you are concerned that you have suffered a radiation induced injury.    All of the patient's questions were answered, patient is agreeable to proceed.  Consent signed and in chart.  Thank you for this interesting consult.  I greatly enjoyed meeting Cassandra Barnes and look forward to participating in their care.  A copy of this report was sent to the requesting provider on this date.  Electronically Signed: Lavonia Drafts, PA-C 04/11/2019, 7:06 AM   I spent a total of  30 Minutes   in face to face in clinical consultation, greater than 50% of which was counseling/coordinating care for aortoperipheral arteriogram with possible intervention

## 2019-04-11 NOTE — Procedures (Addendum)
Interventional Radiology Procedure Note  Procedure: US guided left CFA access for pelvic and right lower extremity angiogram Treatment of critical stenosis of proximal right SFA with 68mm DEB. Angioseal for closure  Findings: Patent left CFA on [place]. No angiographic stenosis of the left or right iliac system. Short segment proximal critical stenosis of the right SFA, completely resolved after treatment with 76mm DEB.  In-line flow to the ankle via the AT and the peroneal artery. Peroneal contributes to the occluded PT via calcaneal branch No origin of the PT identified .  Complications: None  Recommendations:  - To recovery - left hip straight x4 hours - start 81mg  ASA daily, with 75mg  Plavix daily - frequent NV checks - continue wound care - hold metformin x 48 hours, may restart Thursday noon - follow up with Dr. Earleen Newport at [facility] clinic - Do not submerge for 7 days - Routine wound care   Signed,  Dulcy Fanny. Earleen Newport, DO

## 2019-04-11 NOTE — Sedation Documentation (Signed)
Patient ETCO2 in 70s. Unable to give versed for procedure. Patient to be done without conscious sedation. MD aware.

## 2019-04-12 DIAGNOSIS — L89610 Pressure ulcer of right heel, unstageable: Secondary | ICD-10-CM | POA: Diagnosis not present

## 2019-04-12 DIAGNOSIS — J9611 Chronic respiratory failure with hypoxia: Secondary | ICD-10-CM | POA: Diagnosis not present

## 2019-04-12 DIAGNOSIS — R911 Solitary pulmonary nodule: Secondary | ICD-10-CM | POA: Diagnosis not present

## 2019-04-12 DIAGNOSIS — E1142 Type 2 diabetes mellitus with diabetic polyneuropathy: Secondary | ICD-10-CM | POA: Diagnosis not present

## 2019-04-12 DIAGNOSIS — Z794 Long term (current) use of insulin: Secondary | ICD-10-CM | POA: Diagnosis not present

## 2019-04-12 DIAGNOSIS — I110 Hypertensive heart disease with heart failure: Secondary | ICD-10-CM | POA: Diagnosis not present

## 2019-04-12 DIAGNOSIS — E1151 Type 2 diabetes mellitus with diabetic peripheral angiopathy without gangrene: Secondary | ICD-10-CM | POA: Diagnosis not present

## 2019-04-12 DIAGNOSIS — J9612 Chronic respiratory failure with hypercapnia: Secondary | ICD-10-CM | POA: Diagnosis not present

## 2019-04-12 DIAGNOSIS — Z9981 Dependence on supplemental oxygen: Secondary | ICD-10-CM | POA: Diagnosis not present

## 2019-04-12 DIAGNOSIS — K219 Gastro-esophageal reflux disease without esophagitis: Secondary | ICD-10-CM | POA: Diagnosis not present

## 2019-04-12 DIAGNOSIS — G4733 Obstructive sleep apnea (adult) (pediatric): Secondary | ICD-10-CM | POA: Diagnosis not present

## 2019-04-12 DIAGNOSIS — K760 Fatty (change of) liver, not elsewhere classified: Secondary | ICD-10-CM | POA: Diagnosis not present

## 2019-04-12 DIAGNOSIS — M109 Gout, unspecified: Secondary | ICD-10-CM | POA: Diagnosis not present

## 2019-04-12 DIAGNOSIS — M8580 Other specified disorders of bone density and structure, unspecified site: Secondary | ICD-10-CM | POA: Diagnosis not present

## 2019-04-12 DIAGNOSIS — E7800 Pure hypercholesterolemia, unspecified: Secondary | ICD-10-CM | POA: Diagnosis not present

## 2019-04-12 DIAGNOSIS — Z87440 Personal history of urinary (tract) infections: Secondary | ICD-10-CM | POA: Diagnosis not present

## 2019-04-12 DIAGNOSIS — E785 Hyperlipidemia, unspecified: Secondary | ICD-10-CM | POA: Diagnosis not present

## 2019-04-12 DIAGNOSIS — I5032 Chronic diastolic (congestive) heart failure: Secondary | ICD-10-CM | POA: Diagnosis not present

## 2019-04-12 DIAGNOSIS — M159 Polyosteoarthritis, unspecified: Secondary | ICD-10-CM | POA: Diagnosis not present

## 2019-04-12 DIAGNOSIS — I4510 Unspecified right bundle-branch block: Secondary | ICD-10-CM | POA: Diagnosis not present

## 2019-04-12 DIAGNOSIS — J449 Chronic obstructive pulmonary disease, unspecified: Secondary | ICD-10-CM | POA: Diagnosis not present

## 2019-04-12 DIAGNOSIS — R339 Retention of urine, unspecified: Secondary | ICD-10-CM | POA: Diagnosis not present

## 2019-04-12 DIAGNOSIS — E46 Unspecified protein-calorie malnutrition: Secondary | ICD-10-CM | POA: Diagnosis not present

## 2019-04-12 DIAGNOSIS — Z7982 Long term (current) use of aspirin: Secondary | ICD-10-CM | POA: Diagnosis not present

## 2019-04-12 DIAGNOSIS — D509 Iron deficiency anemia, unspecified: Secondary | ICD-10-CM | POA: Diagnosis not present

## 2019-04-14 DIAGNOSIS — R339 Retention of urine, unspecified: Secondary | ICD-10-CM | POA: Diagnosis not present

## 2019-04-14 DIAGNOSIS — M109 Gout, unspecified: Secondary | ICD-10-CM | POA: Diagnosis not present

## 2019-04-14 DIAGNOSIS — D509 Iron deficiency anemia, unspecified: Secondary | ICD-10-CM | POA: Diagnosis not present

## 2019-04-14 DIAGNOSIS — I4510 Unspecified right bundle-branch block: Secondary | ICD-10-CM | POA: Diagnosis not present

## 2019-04-14 DIAGNOSIS — E1151 Type 2 diabetes mellitus with diabetic peripheral angiopathy without gangrene: Secondary | ICD-10-CM | POA: Diagnosis not present

## 2019-04-14 DIAGNOSIS — I5032 Chronic diastolic (congestive) heart failure: Secondary | ICD-10-CM | POA: Diagnosis not present

## 2019-04-14 DIAGNOSIS — K219 Gastro-esophageal reflux disease without esophagitis: Secondary | ICD-10-CM | POA: Diagnosis not present

## 2019-04-14 DIAGNOSIS — M8580 Other specified disorders of bone density and structure, unspecified site: Secondary | ICD-10-CM | POA: Diagnosis not present

## 2019-04-14 DIAGNOSIS — I110 Hypertensive heart disease with heart failure: Secondary | ICD-10-CM | POA: Diagnosis not present

## 2019-04-14 DIAGNOSIS — R911 Solitary pulmonary nodule: Secondary | ICD-10-CM | POA: Diagnosis not present

## 2019-04-14 DIAGNOSIS — E785 Hyperlipidemia, unspecified: Secondary | ICD-10-CM | POA: Diagnosis not present

## 2019-04-14 DIAGNOSIS — Z7982 Long term (current) use of aspirin: Secondary | ICD-10-CM | POA: Diagnosis not present

## 2019-04-14 DIAGNOSIS — Z9981 Dependence on supplemental oxygen: Secondary | ICD-10-CM | POA: Diagnosis not present

## 2019-04-14 DIAGNOSIS — J9612 Chronic respiratory failure with hypercapnia: Secondary | ICD-10-CM | POA: Diagnosis not present

## 2019-04-14 DIAGNOSIS — M159 Polyosteoarthritis, unspecified: Secondary | ICD-10-CM | POA: Diagnosis not present

## 2019-04-14 DIAGNOSIS — L89610 Pressure ulcer of right heel, unstageable: Secondary | ICD-10-CM | POA: Diagnosis not present

## 2019-04-14 DIAGNOSIS — Z87440 Personal history of urinary (tract) infections: Secondary | ICD-10-CM | POA: Diagnosis not present

## 2019-04-14 DIAGNOSIS — G4733 Obstructive sleep apnea (adult) (pediatric): Secondary | ICD-10-CM | POA: Diagnosis not present

## 2019-04-14 DIAGNOSIS — J449 Chronic obstructive pulmonary disease, unspecified: Secondary | ICD-10-CM | POA: Diagnosis not present

## 2019-04-14 DIAGNOSIS — J9611 Chronic respiratory failure with hypoxia: Secondary | ICD-10-CM | POA: Diagnosis not present

## 2019-04-14 DIAGNOSIS — E1142 Type 2 diabetes mellitus with diabetic polyneuropathy: Secondary | ICD-10-CM | POA: Diagnosis not present

## 2019-04-14 DIAGNOSIS — K760 Fatty (change of) liver, not elsewhere classified: Secondary | ICD-10-CM | POA: Diagnosis not present

## 2019-04-14 DIAGNOSIS — Z794 Long term (current) use of insulin: Secondary | ICD-10-CM | POA: Diagnosis not present

## 2019-04-14 DIAGNOSIS — E46 Unspecified protein-calorie malnutrition: Secondary | ICD-10-CM | POA: Diagnosis not present

## 2019-04-14 DIAGNOSIS — E7800 Pure hypercholesterolemia, unspecified: Secondary | ICD-10-CM | POA: Diagnosis not present

## 2019-04-17 ENCOUNTER — Encounter: Payer: Self-pay | Admitting: *Deleted

## 2019-04-17 DIAGNOSIS — I10 Essential (primary) hypertension: Secondary | ICD-10-CM | POA: Diagnosis not present

## 2019-04-17 DIAGNOSIS — E11621 Type 2 diabetes mellitus with foot ulcer: Secondary | ICD-10-CM | POA: Diagnosis not present

## 2019-04-17 DIAGNOSIS — L97412 Non-pressure chronic ulcer of right heel and midfoot with fat layer exposed: Secondary | ICD-10-CM | POA: Diagnosis not present

## 2019-04-18 DIAGNOSIS — E785 Hyperlipidemia, unspecified: Secondary | ICD-10-CM | POA: Diagnosis not present

## 2019-04-18 DIAGNOSIS — I4510 Unspecified right bundle-branch block: Secondary | ICD-10-CM | POA: Diagnosis not present

## 2019-04-18 DIAGNOSIS — J449 Chronic obstructive pulmonary disease, unspecified: Secondary | ICD-10-CM | POA: Diagnosis not present

## 2019-04-18 DIAGNOSIS — E118 Type 2 diabetes mellitus with unspecified complications: Secondary | ICD-10-CM | POA: Diagnosis not present

## 2019-04-18 DIAGNOSIS — D649 Anemia, unspecified: Secondary | ICD-10-CM | POA: Diagnosis not present

## 2019-04-19 ENCOUNTER — Ambulatory Visit: Payer: Medicare Other | Admitting: *Deleted

## 2019-04-19 DIAGNOSIS — L89610 Pressure ulcer of right heel, unstageable: Secondary | ICD-10-CM | POA: Diagnosis not present

## 2019-04-19 DIAGNOSIS — Z7982 Long term (current) use of aspirin: Secondary | ICD-10-CM | POA: Diagnosis not present

## 2019-04-19 DIAGNOSIS — R911 Solitary pulmonary nodule: Secondary | ICD-10-CM | POA: Diagnosis not present

## 2019-04-19 DIAGNOSIS — Z9981 Dependence on supplemental oxygen: Secondary | ICD-10-CM | POA: Diagnosis not present

## 2019-04-19 DIAGNOSIS — M8580 Other specified disorders of bone density and structure, unspecified site: Secondary | ICD-10-CM | POA: Diagnosis not present

## 2019-04-19 DIAGNOSIS — I4510 Unspecified right bundle-branch block: Secondary | ICD-10-CM | POA: Diagnosis not present

## 2019-04-19 DIAGNOSIS — K219 Gastro-esophageal reflux disease without esophagitis: Secondary | ICD-10-CM | POA: Diagnosis not present

## 2019-04-19 DIAGNOSIS — R339 Retention of urine, unspecified: Secondary | ICD-10-CM | POA: Diagnosis not present

## 2019-04-19 DIAGNOSIS — Z87440 Personal history of urinary (tract) infections: Secondary | ICD-10-CM | POA: Diagnosis not present

## 2019-04-19 DIAGNOSIS — E7800 Pure hypercholesterolemia, unspecified: Secondary | ICD-10-CM | POA: Diagnosis not present

## 2019-04-19 DIAGNOSIS — E1142 Type 2 diabetes mellitus with diabetic polyneuropathy: Secondary | ICD-10-CM | POA: Diagnosis not present

## 2019-04-19 DIAGNOSIS — Z794 Long term (current) use of insulin: Secondary | ICD-10-CM | POA: Diagnosis not present

## 2019-04-19 DIAGNOSIS — J9611 Chronic respiratory failure with hypoxia: Secondary | ICD-10-CM | POA: Diagnosis not present

## 2019-04-19 DIAGNOSIS — M109 Gout, unspecified: Secondary | ICD-10-CM | POA: Diagnosis not present

## 2019-04-19 DIAGNOSIS — E1151 Type 2 diabetes mellitus with diabetic peripheral angiopathy without gangrene: Secondary | ICD-10-CM | POA: Diagnosis not present

## 2019-04-19 DIAGNOSIS — K760 Fatty (change of) liver, not elsewhere classified: Secondary | ICD-10-CM | POA: Diagnosis not present

## 2019-04-19 DIAGNOSIS — E785 Hyperlipidemia, unspecified: Secondary | ICD-10-CM | POA: Diagnosis not present

## 2019-04-19 DIAGNOSIS — J449 Chronic obstructive pulmonary disease, unspecified: Secondary | ICD-10-CM | POA: Diagnosis not present

## 2019-04-19 DIAGNOSIS — I110 Hypertensive heart disease with heart failure: Secondary | ICD-10-CM | POA: Diagnosis not present

## 2019-04-19 DIAGNOSIS — M159 Polyosteoarthritis, unspecified: Secondary | ICD-10-CM | POA: Diagnosis not present

## 2019-04-19 DIAGNOSIS — D509 Iron deficiency anemia, unspecified: Secondary | ICD-10-CM | POA: Diagnosis not present

## 2019-04-19 DIAGNOSIS — I5032 Chronic diastolic (congestive) heart failure: Secondary | ICD-10-CM | POA: Diagnosis not present

## 2019-04-19 DIAGNOSIS — G4733 Obstructive sleep apnea (adult) (pediatric): Secondary | ICD-10-CM | POA: Diagnosis not present

## 2019-04-19 DIAGNOSIS — J9612 Chronic respiratory failure with hypercapnia: Secondary | ICD-10-CM | POA: Diagnosis not present

## 2019-04-19 DIAGNOSIS — E46 Unspecified protein-calorie malnutrition: Secondary | ICD-10-CM | POA: Diagnosis not present

## 2019-04-21 DIAGNOSIS — E1151 Type 2 diabetes mellitus with diabetic peripheral angiopathy without gangrene: Secondary | ICD-10-CM | POA: Diagnosis not present

## 2019-04-21 DIAGNOSIS — R911 Solitary pulmonary nodule: Secondary | ICD-10-CM | POA: Diagnosis not present

## 2019-04-21 DIAGNOSIS — M109 Gout, unspecified: Secondary | ICD-10-CM | POA: Diagnosis not present

## 2019-04-21 DIAGNOSIS — R339 Retention of urine, unspecified: Secondary | ICD-10-CM | POA: Diagnosis not present

## 2019-04-21 DIAGNOSIS — M159 Polyosteoarthritis, unspecified: Secondary | ICD-10-CM | POA: Diagnosis not present

## 2019-04-21 DIAGNOSIS — Z9981 Dependence on supplemental oxygen: Secondary | ICD-10-CM | POA: Diagnosis not present

## 2019-04-21 DIAGNOSIS — I110 Hypertensive heart disease with heart failure: Secondary | ICD-10-CM | POA: Diagnosis not present

## 2019-04-21 DIAGNOSIS — L89610 Pressure ulcer of right heel, unstageable: Secondary | ICD-10-CM | POA: Diagnosis not present

## 2019-04-21 DIAGNOSIS — E785 Hyperlipidemia, unspecified: Secondary | ICD-10-CM | POA: Diagnosis not present

## 2019-04-21 DIAGNOSIS — E1142 Type 2 diabetes mellitus with diabetic polyneuropathy: Secondary | ICD-10-CM | POA: Diagnosis not present

## 2019-04-21 DIAGNOSIS — Z794 Long term (current) use of insulin: Secondary | ICD-10-CM | POA: Diagnosis not present

## 2019-04-21 DIAGNOSIS — J9612 Chronic respiratory failure with hypercapnia: Secondary | ICD-10-CM | POA: Diagnosis not present

## 2019-04-21 DIAGNOSIS — G4733 Obstructive sleep apnea (adult) (pediatric): Secondary | ICD-10-CM | POA: Diagnosis not present

## 2019-04-21 DIAGNOSIS — Z87440 Personal history of urinary (tract) infections: Secondary | ICD-10-CM | POA: Diagnosis not present

## 2019-04-21 DIAGNOSIS — M8580 Other specified disorders of bone density and structure, unspecified site: Secondary | ICD-10-CM | POA: Diagnosis not present

## 2019-04-21 DIAGNOSIS — J449 Chronic obstructive pulmonary disease, unspecified: Secondary | ICD-10-CM | POA: Diagnosis not present

## 2019-04-21 DIAGNOSIS — K760 Fatty (change of) liver, not elsewhere classified: Secondary | ICD-10-CM | POA: Diagnosis not present

## 2019-04-21 DIAGNOSIS — K219 Gastro-esophageal reflux disease without esophagitis: Secondary | ICD-10-CM | POA: Diagnosis not present

## 2019-04-21 DIAGNOSIS — I4510 Unspecified right bundle-branch block: Secondary | ICD-10-CM | POA: Diagnosis not present

## 2019-04-21 DIAGNOSIS — E7800 Pure hypercholesterolemia, unspecified: Secondary | ICD-10-CM | POA: Diagnosis not present

## 2019-04-21 DIAGNOSIS — D509 Iron deficiency anemia, unspecified: Secondary | ICD-10-CM | POA: Diagnosis not present

## 2019-04-21 DIAGNOSIS — J9611 Chronic respiratory failure with hypoxia: Secondary | ICD-10-CM | POA: Diagnosis not present

## 2019-04-21 DIAGNOSIS — E46 Unspecified protein-calorie malnutrition: Secondary | ICD-10-CM | POA: Diagnosis not present

## 2019-04-21 DIAGNOSIS — I5032 Chronic diastolic (congestive) heart failure: Secondary | ICD-10-CM | POA: Diagnosis not present

## 2019-04-21 DIAGNOSIS — Z7982 Long term (current) use of aspirin: Secondary | ICD-10-CM | POA: Diagnosis not present

## 2019-04-25 ENCOUNTER — Other Ambulatory Visit: Payer: Self-pay | Admitting: *Deleted

## 2019-04-25 ENCOUNTER — Encounter: Payer: Self-pay | Admitting: *Deleted

## 2019-04-25 DIAGNOSIS — I10 Essential (primary) hypertension: Secondary | ICD-10-CM | POA: Diagnosis not present

## 2019-04-25 DIAGNOSIS — E11621 Type 2 diabetes mellitus with foot ulcer: Secondary | ICD-10-CM | POA: Diagnosis not present

## 2019-04-25 DIAGNOSIS — L89613 Pressure ulcer of right heel, stage 3: Secondary | ICD-10-CM | POA: Diagnosis not present

## 2019-04-25 DIAGNOSIS — L97412 Non-pressure chronic ulcer of right heel and midfoot with fat layer exposed: Secondary | ICD-10-CM | POA: Diagnosis not present

## 2019-04-25 NOTE — Patient Outreach (Signed)
[facility]) Care Management [place] Telephone Outreach Unsuccessful (consecutive) outreach attempt # 1- previously engaged patient 04/25/2019  Cassandra Barnes March 22, 1946 [id number]  10:55 am:  Unsuccessful telephone outreach to [name], 74 y/o female with recent hospitalization at [facility] December 13, 2018 for AMS/ sepsis/ hypoxic respiratory failure related to pneumonia/ UTI; patient was discharged from hospital to SNF for rehabilitation and was discharged from SNF back home to self carewith home health services in place. Patient has history including, but not limited to, COPD, on home O2; OSA with CPAP; DM; and morbid obesity.  HIPAA compliant voice mail message left for patient, requesting return call back.  Plan:  Will re-attempt [place] telephone outreach within 4 business days if I do not hear back from patient first.  Oneta Rack, RN, BSN, [place] Coordinator [facility] Care Management  [phone]

## 2019-04-27 DIAGNOSIS — Z9981 Dependence on supplemental oxygen: Secondary | ICD-10-CM | POA: Diagnosis not present

## 2019-04-27 DIAGNOSIS — M8580 Other specified disorders of bone density and structure, unspecified site: Secondary | ICD-10-CM | POA: Diagnosis not present

## 2019-04-27 DIAGNOSIS — J9612 Chronic respiratory failure with hypercapnia: Secondary | ICD-10-CM | POA: Diagnosis not present

## 2019-04-27 DIAGNOSIS — M109 Gout, unspecified: Secondary | ICD-10-CM | POA: Diagnosis not present

## 2019-04-27 DIAGNOSIS — M159 Polyosteoarthritis, unspecified: Secondary | ICD-10-CM | POA: Diagnosis not present

## 2019-04-27 DIAGNOSIS — K760 Fatty (change of) liver, not elsewhere classified: Secondary | ICD-10-CM | POA: Diagnosis not present

## 2019-04-27 DIAGNOSIS — D509 Iron deficiency anemia, unspecified: Secondary | ICD-10-CM | POA: Diagnosis not present

## 2019-04-27 DIAGNOSIS — I4510 Unspecified right bundle-branch block: Secondary | ICD-10-CM | POA: Diagnosis not present

## 2019-04-27 DIAGNOSIS — E1151 Type 2 diabetes mellitus with diabetic peripheral angiopathy without gangrene: Secondary | ICD-10-CM | POA: Diagnosis not present

## 2019-04-27 DIAGNOSIS — Z7982 Long term (current) use of aspirin: Secondary | ICD-10-CM | POA: Diagnosis not present

## 2019-04-27 DIAGNOSIS — J9611 Chronic respiratory failure with hypoxia: Secondary | ICD-10-CM | POA: Diagnosis not present

## 2019-04-27 DIAGNOSIS — I110 Hypertensive heart disease with heart failure: Secondary | ICD-10-CM | POA: Diagnosis not present

## 2019-04-27 DIAGNOSIS — R911 Solitary pulmonary nodule: Secondary | ICD-10-CM | POA: Diagnosis not present

## 2019-04-27 DIAGNOSIS — J449 Chronic obstructive pulmonary disease, unspecified: Secondary | ICD-10-CM | POA: Diagnosis not present

## 2019-04-27 DIAGNOSIS — E7800 Pure hypercholesterolemia, unspecified: Secondary | ICD-10-CM | POA: Diagnosis not present

## 2019-04-27 DIAGNOSIS — Z87440 Personal history of urinary (tract) infections: Secondary | ICD-10-CM | POA: Diagnosis not present

## 2019-04-27 DIAGNOSIS — J9621 Acute and chronic respiratory failure with hypoxia: Secondary | ICD-10-CM | POA: Diagnosis not present

## 2019-04-27 DIAGNOSIS — G4733 Obstructive sleep apnea (adult) (pediatric): Secondary | ICD-10-CM | POA: Diagnosis not present

## 2019-04-27 DIAGNOSIS — E46 Unspecified protein-calorie malnutrition: Secondary | ICD-10-CM | POA: Diagnosis not present

## 2019-04-27 DIAGNOSIS — E1142 Type 2 diabetes mellitus with diabetic polyneuropathy: Secondary | ICD-10-CM | POA: Diagnosis not present

## 2019-04-27 DIAGNOSIS — R339 Retention of urine, unspecified: Secondary | ICD-10-CM | POA: Diagnosis not present

## 2019-04-27 DIAGNOSIS — K219 Gastro-esophageal reflux disease without esophagitis: Secondary | ICD-10-CM | POA: Diagnosis not present

## 2019-04-27 DIAGNOSIS — E785 Hyperlipidemia, unspecified: Secondary | ICD-10-CM | POA: Diagnosis not present

## 2019-04-27 DIAGNOSIS — L89610 Pressure ulcer of right heel, unstageable: Secondary | ICD-10-CM | POA: Diagnosis not present

## 2019-04-27 DIAGNOSIS — Z794 Long term (current) use of insulin: Secondary | ICD-10-CM | POA: Diagnosis not present

## 2019-04-27 DIAGNOSIS — I5032 Chronic diastolic (congestive) heart failure: Secondary | ICD-10-CM | POA: Diagnosis not present

## 2019-04-28 ENCOUNTER — Encounter: Payer: Self-pay | Admitting: *Deleted

## 2019-04-28 ENCOUNTER — Other Ambulatory Visit: Payer: Self-pay | Admitting: *Deleted

## 2019-04-28 DIAGNOSIS — L0390 Cellulitis, unspecified: Secondary | ICD-10-CM | POA: Diagnosis not present

## 2019-04-28 DIAGNOSIS — E118 Type 2 diabetes mellitus with unspecified complications: Secondary | ICD-10-CM | POA: Diagnosis not present

## 2019-04-28 DIAGNOSIS — J449 Chronic obstructive pulmonary disease, unspecified: Secondary | ICD-10-CM | POA: Diagnosis not present

## 2019-04-28 DIAGNOSIS — E785 Hyperlipidemia, unspecified: Secondary | ICD-10-CM | POA: Diagnosis not present

## 2019-04-28 DIAGNOSIS — I4510 Unspecified right bundle-branch block: Secondary | ICD-10-CM | POA: Diagnosis not present

## 2019-04-28 NOTE — Patient Outreach (Signed)
[facility]) Care Management [place] Telephone Outreach  04/28/2019  Cassandra Barnes 01/18/1946 [id number]  Successful telephone outreach to Cassandra Barnes, 74 y/o female with recent hospitalization at [facility] December 13, 2018 for AMS/ sepsis/ hypoxic respiratory failure related to pneumonia/ UTI; patient was discharged from hospital to SNF for rehabilitation and was apparently recently discharged from SNF back home to self carewith home health services in place. Patient has history including, but not limited to, COPD, on home O2; OSA with CPAP; DM; and morbid obesity.HIPAA/ identity verified with patient during phone call today.  Today, patientagainreports that she "is doingfine, still here" and shedenies clinical concerns/ issues/ problems today and sounds to be in no distress throughout entirety of phone call.  Patient further reports:  -- home health services continue for RN, dressing changes/ foot wound assessment completed with each visit; patient reports foot wound "lloking better" from report of weekly wound clinic visits and home health RN visits; hoping that eventually home health PT will be able to become involved; states that she attempts to "do stretches in bed" with her legs and feet and this was encouraged  -- weekly visits to wound clinic "going fine;" continues using established transportation services and reports next visit scheduled on Tuesday 05/03/19 ---- had recent stent placed in leg to "help foot wound heal;" states that she has now been placed on "new" blood thinners- medication list in EMR updated according to patient report today  -- continues using home O2 at 2 L/min; previously we have extensively discussed self-health management strategies for COPD; signs/ symptoms yellow COPD zone; today, patient is only able to independently verbalize a few signs/ symptoms COPD yellow zone, and her stated plan of action is to "call 911 when  I can't breathe;" patient declines need for additional information/ strategies to improve chronic disease state of COPD, states, "I just use my oxygen and call 911 when I can't breathe."  -- caregiver continues monitoring and recording blood sugars at home 3-4 times per day; patient continues to report blood sugars "all over the place," and she and caregiver both decline review of same during telephone call today; reports fasting blood sugar this morning of "271" and adds that her insulin dosing was increased recently- medication list updated according to patient report ---- patient continues to decline education around dietary strategies for self-health management of chronic disease state of DM; she does not wish to discuss her diet at all; states she continues to eat "regular" diet, although she "tries" to eat healthy ---- denies recent episodes hypoglycemia, although she continues to say that "blood sugars are all over the place"  Patientdeniesfurther issues, concerns, or problems today. Iconfirmed thatpatienthasmy direct phone number, the main [facility] CM office phone number, and the [facility] CM 24-hour nurse advice phone number should issues arise prior to next scheduled [facility] RN CM outreach next month.  Encouraged patient or caregiverto contact me directly if needs, questions, issues, or concerns arise prior to next scheduled outreach;sheagreed to do so.  Plan:  Patient will take medications as prescribed and will attend all scheduled provider appointments  Patient will promptly notify care providers for any new concerns/ issues/ problems that arise  Patient will actively participate in home health services as ordered post-hospital discharge  Patient will monitor and record blood sugars 3-4 times per day   [place] outreachto continue with scheduled phone callnext month  [facility] CM Care Plan Problem One     Most  Recent Value  Care Plan Problem One  Ongoing management of  chronic foot wound, as evidenced by patient reporting  Role Documenting the Problem One  Care Management Coordinator  Care Plan for Problem One  Active  [facility] Long Term Goal   Over the next 60 days, patient will continue attending weekly wound clinic visits, as evidenced by patient reporting and collaboration with care providers as indicated during [facility] RN CM outreach  [place] Term Goal Start Date  04/28/19  Interventions for Problem One Long Term Goal  Discussed recent visits to wound clinic and recent stent placement,  confirmed that patient continues attending weekly wound clinic visits and has stable/ reliable transportation  [facility] CM Short Term Goal #1   Over the next 30 days, patient will continue to participate in home health RN visits for wound management, as evidenced by patient reporting and collaboration with home health team as indicated during [facility] RN CM outreach  [facility] CM Short Term Goal #1 Start Date  04/28/19  Interventions for Short Term Goal #1  Confirmed with patient that home health RN continues visiting her at home weekly for wound assessment/ dressings,  encouraged patient's ongoing participation with home health services    [facility] CM Care Plan Problem Two     Most Recent Value  Care Plan Problem Two  Self-health management of chronic disease state of COPD, as evidenced by recent hospital admission for COPD exacerbation with pneumonia  Role Documenting the Problem Two  Care Management Coordinator  Care Plan for Problem Two  Not Active  Interventions for Problem Two Long Term Goal   Using teachback method, discussed/ reiterated with patient signs/ symptoms yellow COPD zone, along with previously provided education around corresponding action plan,  confirmed that patient is able to verbalize a few signs/ symptoms COPD yellow zone without prompting,  confirmed that patient continues using home O2 at 2 L/min and confirmed that she does not have current clinical concerns aorund her breathing  status  [facility] Long Term Goal  Over the next 60 days, patient will be able to verbalize signs/ symptoms COPD yellow zone, along with corresponding action plan for same, as evidenced by patient reporting during [facility] RN CM outreach  [facility] Long Term Goal Start Date  03/01/19  [facility] Long Term Goal Met Date  04/28/19 [Goal partially met]    [facility] CM Care Plan Problem Three     Most Recent Value  Care Plan Problem Three  Knowledge deficit related to self-health management of chronic disease state of DM, as evidenced by patient reporting  Role Documenting the Problem Three  Care Management Coordinator  Care Plan for Problem Three  Not Active  [facility] Long Term Goal   Over the next 60 days, patient will continue to monitor and record blood sugar values at home, as evidenced by review of same with patient and/ or caregiver  [facility] Long Term Goal Start Date  03/09/19  [facility] Long Term Goal Met Date  04/28/19 [facility] Met]  Interventions for Problem Three Long Term Goal  Confirmed that patient's caregiver continues to monitor/ record daily blood sugars 3-4 times per day,  updated medication list to relflect new insulin dosing, per patient report,  confirmed that patient does not wish to learn more about strategies to decrease her blood sugars, and that she is not following a specific diet,  confirmed that she has had no recent episodes hypoglycemia, as previously reported     Oneta Rack, RN, BSN, CCRN  [place] Care Management  [phone]

## 2019-05-02 DIAGNOSIS — E46 Unspecified protein-calorie malnutrition: Secondary | ICD-10-CM | POA: Diagnosis not present

## 2019-05-02 DIAGNOSIS — K760 Fatty (change of) liver, not elsewhere classified: Secondary | ICD-10-CM | POA: Diagnosis not present

## 2019-05-02 DIAGNOSIS — E11628 Type 2 diabetes mellitus with other skin complications: Secondary | ICD-10-CM | POA: Diagnosis not present

## 2019-05-02 DIAGNOSIS — M159 Polyosteoarthritis, unspecified: Secondary | ICD-10-CM | POA: Diagnosis not present

## 2019-05-02 DIAGNOSIS — Z87440 Personal history of urinary (tract) infections: Secondary | ICD-10-CM | POA: Diagnosis not present

## 2019-05-02 DIAGNOSIS — R911 Solitary pulmonary nodule: Secondary | ICD-10-CM | POA: Diagnosis not present

## 2019-05-02 DIAGNOSIS — E1142 Type 2 diabetes mellitus with diabetic polyneuropathy: Secondary | ICD-10-CM | POA: Diagnosis not present

## 2019-05-02 DIAGNOSIS — R339 Retention of urine, unspecified: Secondary | ICD-10-CM | POA: Diagnosis not present

## 2019-05-02 DIAGNOSIS — M8580 Other specified disorders of bone density and structure, unspecified site: Secondary | ICD-10-CM | POA: Diagnosis not present

## 2019-05-02 DIAGNOSIS — K219 Gastro-esophageal reflux disease without esophagitis: Secondary | ICD-10-CM | POA: Diagnosis not present

## 2019-05-02 DIAGNOSIS — L89610 Pressure ulcer of right heel, unstageable: Secondary | ICD-10-CM | POA: Diagnosis not present

## 2019-05-02 DIAGNOSIS — E785 Hyperlipidemia, unspecified: Secondary | ICD-10-CM | POA: Diagnosis not present

## 2019-05-02 DIAGNOSIS — L97412 Non-pressure chronic ulcer of right heel and midfoot with fat layer exposed: Secondary | ICD-10-CM | POA: Diagnosis not present

## 2019-05-02 DIAGNOSIS — Z794 Long term (current) use of insulin: Secondary | ICD-10-CM | POA: Diagnosis not present

## 2019-05-02 DIAGNOSIS — M109 Gout, unspecified: Secondary | ICD-10-CM | POA: Diagnosis not present

## 2019-05-02 DIAGNOSIS — G4733 Obstructive sleep apnea (adult) (pediatric): Secondary | ICD-10-CM | POA: Diagnosis not present

## 2019-05-02 DIAGNOSIS — Z7982 Long term (current) use of aspirin: Secondary | ICD-10-CM | POA: Diagnosis not present

## 2019-05-02 DIAGNOSIS — J449 Chronic obstructive pulmonary disease, unspecified: Secondary | ICD-10-CM | POA: Diagnosis not present

## 2019-05-02 DIAGNOSIS — E1151 Type 2 diabetes mellitus with diabetic peripheral angiopathy without gangrene: Secondary | ICD-10-CM | POA: Diagnosis not present

## 2019-05-02 DIAGNOSIS — I10 Essential (primary) hypertension: Secondary | ICD-10-CM | POA: Diagnosis not present

## 2019-05-02 DIAGNOSIS — L89613 Pressure ulcer of right heel, stage 3: Secondary | ICD-10-CM | POA: Diagnosis not present

## 2019-05-02 DIAGNOSIS — J9611 Chronic respiratory failure with hypoxia: Secondary | ICD-10-CM | POA: Diagnosis not present

## 2019-05-02 DIAGNOSIS — I110 Hypertensive heart disease with heart failure: Secondary | ICD-10-CM | POA: Diagnosis not present

## 2019-05-02 DIAGNOSIS — I5032 Chronic diastolic (congestive) heart failure: Secondary | ICD-10-CM | POA: Diagnosis not present

## 2019-05-02 DIAGNOSIS — D509 Iron deficiency anemia, unspecified: Secondary | ICD-10-CM | POA: Diagnosis not present

## 2019-05-02 DIAGNOSIS — E11621 Type 2 diabetes mellitus with foot ulcer: Secondary | ICD-10-CM | POA: Diagnosis not present

## 2019-05-02 DIAGNOSIS — J9612 Chronic respiratory failure with hypercapnia: Secondary | ICD-10-CM | POA: Diagnosis not present

## 2019-05-02 DIAGNOSIS — Z9981 Dependence on supplemental oxygen: Secondary | ICD-10-CM | POA: Diagnosis not present

## 2019-05-02 DIAGNOSIS — E7800 Pure hypercholesterolemia, unspecified: Secondary | ICD-10-CM | POA: Diagnosis not present

## 2019-05-02 DIAGNOSIS — I4510 Unspecified right bundle-branch block: Secondary | ICD-10-CM | POA: Diagnosis not present

## 2019-05-04 DIAGNOSIS — I739 Peripheral vascular disease, unspecified: Secondary | ICD-10-CM | POA: Diagnosis not present

## 2019-05-04 DIAGNOSIS — Z7982 Long term (current) use of aspirin: Secondary | ICD-10-CM | POA: Diagnosis not present

## 2019-05-04 DIAGNOSIS — I509 Heart failure, unspecified: Secondary | ICD-10-CM | POA: Diagnosis not present

## 2019-05-04 DIAGNOSIS — J449 Chronic obstructive pulmonary disease, unspecified: Secondary | ICD-10-CM | POA: Diagnosis not present

## 2019-05-04 DIAGNOSIS — E7800 Pure hypercholesterolemia, unspecified: Secondary | ICD-10-CM | POA: Diagnosis not present

## 2019-05-04 DIAGNOSIS — E46 Unspecified protein-calorie malnutrition: Secondary | ICD-10-CM | POA: Diagnosis not present

## 2019-05-04 DIAGNOSIS — E785 Hyperlipidemia, unspecified: Secondary | ICD-10-CM | POA: Diagnosis not present

## 2019-05-04 DIAGNOSIS — R339 Retention of urine, unspecified: Secondary | ICD-10-CM | POA: Diagnosis not present

## 2019-05-04 DIAGNOSIS — M159 Polyosteoarthritis, unspecified: Secondary | ICD-10-CM | POA: Diagnosis not present

## 2019-05-04 DIAGNOSIS — L89610 Pressure ulcer of right heel, unstageable: Secondary | ICD-10-CM | POA: Diagnosis not present

## 2019-05-04 DIAGNOSIS — G4733 Obstructive sleep apnea (adult) (pediatric): Secondary | ICD-10-CM | POA: Diagnosis not present

## 2019-05-04 DIAGNOSIS — Z9981 Dependence on supplemental oxygen: Secondary | ICD-10-CM | POA: Diagnosis not present

## 2019-05-04 DIAGNOSIS — M109 Gout, unspecified: Secondary | ICD-10-CM | POA: Diagnosis not present

## 2019-05-04 DIAGNOSIS — I4510 Unspecified right bundle-branch block: Secondary | ICD-10-CM | POA: Diagnosis not present

## 2019-05-04 DIAGNOSIS — I5032 Chronic diastolic (congestive) heart failure: Secondary | ICD-10-CM | POA: Diagnosis not present

## 2019-05-04 DIAGNOSIS — J9611 Chronic respiratory failure with hypoxia: Secondary | ICD-10-CM | POA: Diagnosis not present

## 2019-05-04 DIAGNOSIS — Z794 Long term (current) use of insulin: Secondary | ICD-10-CM | POA: Diagnosis not present

## 2019-05-04 DIAGNOSIS — E1142 Type 2 diabetes mellitus with diabetic polyneuropathy: Secondary | ICD-10-CM | POA: Diagnosis not present

## 2019-05-04 DIAGNOSIS — K760 Fatty (change of) liver, not elsewhere classified: Secondary | ICD-10-CM | POA: Diagnosis not present

## 2019-05-04 DIAGNOSIS — K219 Gastro-esophageal reflux disease without esophagitis: Secondary | ICD-10-CM | POA: Diagnosis not present

## 2019-05-04 DIAGNOSIS — D509 Iron deficiency anemia, unspecified: Secondary | ICD-10-CM | POA: Diagnosis not present

## 2019-05-04 DIAGNOSIS — M8580 Other specified disorders of bone density and structure, unspecified site: Secondary | ICD-10-CM | POA: Diagnosis not present

## 2019-05-04 DIAGNOSIS — I110 Hypertensive heart disease with heart failure: Secondary | ICD-10-CM | POA: Diagnosis not present

## 2019-05-04 DIAGNOSIS — J9612 Chronic respiratory failure with hypercapnia: Secondary | ICD-10-CM | POA: Diagnosis not present

## 2019-05-04 DIAGNOSIS — Z87440 Personal history of urinary (tract) infections: Secondary | ICD-10-CM | POA: Diagnosis not present

## 2019-05-04 DIAGNOSIS — R911 Solitary pulmonary nodule: Secondary | ICD-10-CM | POA: Diagnosis not present

## 2019-05-04 DIAGNOSIS — E1151 Type 2 diabetes mellitus with diabetic peripheral angiopathy without gangrene: Secondary | ICD-10-CM | POA: Diagnosis not present

## 2019-05-05 DIAGNOSIS — Z743 Need for continuous supervision: Secondary | ICD-10-CM | POA: Diagnosis not present

## 2019-05-05 DIAGNOSIS — E860 Dehydration: Secondary | ICD-10-CM | POA: Diagnosis not present

## 2019-05-05 DIAGNOSIS — Z825 Family history of asthma and other chronic lower respiratory diseases: Secondary | ICD-10-CM | POA: Diagnosis not present

## 2019-05-05 DIAGNOSIS — R0600 Dyspnea, unspecified: Secondary | ICD-10-CM | POA: Diagnosis not present

## 2019-05-05 DIAGNOSIS — K5520 Angiodysplasia of colon without hemorrhage: Secondary | ICD-10-CM | POA: Diagnosis not present

## 2019-05-05 DIAGNOSIS — M2550 Pain in unspecified joint: Secondary | ICD-10-CM | POA: Diagnosis not present

## 2019-05-05 DIAGNOSIS — R079 Chest pain, unspecified: Secondary | ICD-10-CM | POA: Diagnosis not present

## 2019-05-05 DIAGNOSIS — R0902 Hypoxemia: Secondary | ICD-10-CM | POA: Diagnosis not present

## 2019-05-05 DIAGNOSIS — I509 Heart failure, unspecified: Secondary | ICD-10-CM | POA: Diagnosis not present

## 2019-05-05 DIAGNOSIS — T39395A Adverse effect of other nonsteroidal anti-inflammatory drugs [NSAID], initial encounter: Secondary | ICD-10-CM | POA: Diagnosis not present

## 2019-05-05 DIAGNOSIS — N179 Acute kidney failure, unspecified: Secondary | ICD-10-CM | POA: Diagnosis not present

## 2019-05-05 DIAGNOSIS — R52 Pain, unspecified: Secondary | ICD-10-CM | POA: Diagnosis not present

## 2019-05-05 DIAGNOSIS — I959 Hypotension, unspecified: Secondary | ICD-10-CM | POA: Diagnosis not present

## 2019-05-05 DIAGNOSIS — E1165 Type 2 diabetes mellitus with hyperglycemia: Secondary | ICD-10-CM | POA: Diagnosis not present

## 2019-05-05 DIAGNOSIS — Z794 Long term (current) use of insulin: Secondary | ICD-10-CM | POA: Diagnosis not present

## 2019-05-05 DIAGNOSIS — I70209 Unspecified atherosclerosis of native arteries of extremities, unspecified extremity: Secondary | ICD-10-CM | POA: Diagnosis not present

## 2019-05-05 DIAGNOSIS — E118 Type 2 diabetes mellitus with unspecified complications: Secondary | ICD-10-CM | POA: Diagnosis not present

## 2019-05-05 DIAGNOSIS — E785 Hyperlipidemia, unspecified: Secondary | ICD-10-CM | POA: Diagnosis not present

## 2019-05-05 DIAGNOSIS — L0390 Cellulitis, unspecified: Secondary | ICD-10-CM | POA: Diagnosis not present

## 2019-05-05 DIAGNOSIS — L97419 Non-pressure chronic ulcer of right heel and midfoot with unspecified severity: Secondary | ICD-10-CM | POA: Diagnosis not present

## 2019-05-05 DIAGNOSIS — D509 Iron deficiency anemia, unspecified: Secondary | ICD-10-CM | POA: Diagnosis not present

## 2019-05-05 DIAGNOSIS — Z9981 Dependence on supplemental oxygen: Secondary | ICD-10-CM | POA: Diagnosis not present

## 2019-05-05 DIAGNOSIS — K921 Melena: Secondary | ICD-10-CM | POA: Diagnosis not present

## 2019-05-05 DIAGNOSIS — R0602 Shortness of breath: Secondary | ICD-10-CM | POA: Diagnosis not present

## 2019-05-05 DIAGNOSIS — K635 Polyp of colon: Secondary | ICD-10-CM | POA: Diagnosis not present

## 2019-05-05 DIAGNOSIS — J449 Chronic obstructive pulmonary disease, unspecified: Secondary | ICD-10-CM | POA: Diagnosis not present

## 2019-05-05 DIAGNOSIS — E1159 Type 2 diabetes mellitus with other circulatory complications: Secondary | ICD-10-CM | POA: Diagnosis not present

## 2019-05-05 DIAGNOSIS — D649 Anemia, unspecified: Secondary | ICD-10-CM | POA: Diagnosis not present

## 2019-05-05 DIAGNOSIS — R58 Hemorrhage, not elsewhere classified: Secondary | ICD-10-CM | POA: Diagnosis not present

## 2019-05-05 DIAGNOSIS — D500 Iron deficiency anemia secondary to blood loss (chronic): Secondary | ICD-10-CM | POA: Diagnosis not present

## 2019-05-05 DIAGNOSIS — I4510 Unspecified right bundle-branch block: Secondary | ICD-10-CM | POA: Diagnosis not present

## 2019-05-05 DIAGNOSIS — I110 Hypertensive heart disease with heart failure: Secondary | ICD-10-CM | POA: Diagnosis not present

## 2019-05-05 DIAGNOSIS — Z7401 Bed confinement status: Secondary | ICD-10-CM | POA: Diagnosis not present

## 2019-05-06 DIAGNOSIS — R079 Chest pain, unspecified: Secondary | ICD-10-CM | POA: Diagnosis not present

## 2019-05-06 DIAGNOSIS — R0600 Dyspnea, unspecified: Secondary | ICD-10-CM | POA: Diagnosis not present

## 2019-05-07 DIAGNOSIS — R079 Chest pain, unspecified: Secondary | ICD-10-CM | POA: Diagnosis not present

## 2019-05-07 DIAGNOSIS — R0600 Dyspnea, unspecified: Secondary | ICD-10-CM | POA: Diagnosis not present

## 2019-05-08 DIAGNOSIS — R0600 Dyspnea, unspecified: Secondary | ICD-10-CM | POA: Diagnosis not present

## 2019-05-08 DIAGNOSIS — R079 Chest pain, unspecified: Secondary | ICD-10-CM | POA: Diagnosis not present

## 2019-05-09 DIAGNOSIS — R339 Retention of urine, unspecified: Secondary | ICD-10-CM | POA: Diagnosis not present

## 2019-05-09 DIAGNOSIS — E1142 Type 2 diabetes mellitus with diabetic polyneuropathy: Secondary | ICD-10-CM | POA: Diagnosis not present

## 2019-05-09 DIAGNOSIS — I110 Hypertensive heart disease with heart failure: Secondary | ICD-10-CM | POA: Diagnosis not present

## 2019-05-09 DIAGNOSIS — K760 Fatty (change of) liver, not elsewhere classified: Secondary | ICD-10-CM | POA: Diagnosis not present

## 2019-05-09 DIAGNOSIS — M109 Gout, unspecified: Secondary | ICD-10-CM | POA: Diagnosis not present

## 2019-05-09 DIAGNOSIS — Z87440 Personal history of urinary (tract) infections: Secondary | ICD-10-CM | POA: Diagnosis not present

## 2019-05-09 DIAGNOSIS — M8580 Other specified disorders of bone density and structure, unspecified site: Secondary | ICD-10-CM | POA: Diagnosis not present

## 2019-05-09 DIAGNOSIS — L97412 Non-pressure chronic ulcer of right heel and midfoot with fat layer exposed: Secondary | ICD-10-CM | POA: Diagnosis not present

## 2019-05-09 DIAGNOSIS — I5032 Chronic diastolic (congestive) heart failure: Secondary | ICD-10-CM | POA: Diagnosis not present

## 2019-05-09 DIAGNOSIS — J449 Chronic obstructive pulmonary disease, unspecified: Secondary | ICD-10-CM | POA: Diagnosis not present

## 2019-05-09 DIAGNOSIS — J9611 Chronic respiratory failure with hypoxia: Secondary | ICD-10-CM | POA: Diagnosis not present

## 2019-05-09 DIAGNOSIS — M159 Polyosteoarthritis, unspecified: Secondary | ICD-10-CM | POA: Diagnosis not present

## 2019-05-09 DIAGNOSIS — Z794 Long term (current) use of insulin: Secondary | ICD-10-CM | POA: Diagnosis not present

## 2019-05-09 DIAGNOSIS — G4733 Obstructive sleep apnea (adult) (pediatric): Secondary | ICD-10-CM | POA: Diagnosis not present

## 2019-05-09 DIAGNOSIS — D509 Iron deficiency anemia, unspecified: Secondary | ICD-10-CM | POA: Diagnosis not present

## 2019-05-09 DIAGNOSIS — Z9981 Dependence on supplemental oxygen: Secondary | ICD-10-CM | POA: Diagnosis not present

## 2019-05-09 DIAGNOSIS — R911 Solitary pulmonary nodule: Secondary | ICD-10-CM | POA: Diagnosis not present

## 2019-05-09 DIAGNOSIS — E785 Hyperlipidemia, unspecified: Secondary | ICD-10-CM | POA: Diagnosis not present

## 2019-05-09 DIAGNOSIS — E11621 Type 2 diabetes mellitus with foot ulcer: Secondary | ICD-10-CM | POA: Diagnosis not present

## 2019-05-09 DIAGNOSIS — Z7982 Long term (current) use of aspirin: Secondary | ICD-10-CM | POA: Diagnosis not present

## 2019-05-09 DIAGNOSIS — I4510 Unspecified right bundle-branch block: Secondary | ICD-10-CM | POA: Diagnosis not present

## 2019-05-09 DIAGNOSIS — K219 Gastro-esophageal reflux disease without esophagitis: Secondary | ICD-10-CM | POA: Diagnosis not present

## 2019-05-09 DIAGNOSIS — J9612 Chronic respiratory failure with hypercapnia: Secondary | ICD-10-CM | POA: Diagnosis not present

## 2019-05-09 DIAGNOSIS — E1151 Type 2 diabetes mellitus with diabetic peripheral angiopathy without gangrene: Secondary | ICD-10-CM | POA: Diagnosis not present

## 2019-05-09 DIAGNOSIS — E46 Unspecified protein-calorie malnutrition: Secondary | ICD-10-CM | POA: Diagnosis not present

## 2019-05-10 ENCOUNTER — Other Ambulatory Visit: Payer: Self-pay | Admitting: *Deleted

## 2019-05-10 ENCOUNTER — Encounter: Payer: Self-pay | Admitting: *Deleted

## 2019-05-10 DIAGNOSIS — J449 Chronic obstructive pulmonary disease, unspecified: Secondary | ICD-10-CM | POA: Diagnosis not present

## 2019-05-10 DIAGNOSIS — E785 Hyperlipidemia, unspecified: Secondary | ICD-10-CM | POA: Diagnosis not present

## 2019-05-10 DIAGNOSIS — K922 Gastrointestinal hemorrhage, unspecified: Secondary | ICD-10-CM | POA: Diagnosis not present

## 2019-05-10 DIAGNOSIS — K219 Gastro-esophageal reflux disease without esophagitis: Secondary | ICD-10-CM | POA: Diagnosis not present

## 2019-05-10 DIAGNOSIS — E118 Type 2 diabetes mellitus with unspecified complications: Secondary | ICD-10-CM | POA: Diagnosis not present

## 2019-05-10 NOTE — Patient Outreach (Addendum)
[facility]) [place] Telephone Outreach PCP completes Transition of Care follow up post-hospital discharge Post-hospital discharge day # 2  05/10/2019  Cassandra Barnes [birth year] [id number]  Successful telephone outreach to [facility], 74 y/o female with recent hospitalization at [facility] [facility] December 13, 2018 for AMS/ sepsis/ hypoxic respiratory failure related to pneumonia/ UTI; patient was discharged from hospital to SNF for rehabilitation and was apparently recently discharged from SNF back home to self carewith home health services in place. Patient has history including, but not limited to, COPD, on home O2; OSA with CPAP; DM; and morbid obesity.  Received notification from [facility] Liaison that patient experienced hospital re-ad[facility] at [facility] and was discharged home to self-care on May 08, 2019 with ongoing home health services  HIPAA/ identity verified with patient during phone call today; patient reports "doing fine," and she reports that she experienced shortness of breath and "couldn't breather" and was therefore re-admitted to hospital.  Patient reports that while hospitalized, it was determined that she was anemic, which lead her to become short of breath; states her "blood count was down to 5," and adds that she was given several units of blood while in hospital.  Patient denies pain outside of her chronic pain and new/ recent falls.  She sounds to be in no distress throughout entirety of call today.  Patient further reports:  -- had post-hospital discharge virtual office visit with PCP earlier this morning, with her daughter/ caregiver present; denies questions/ concerns around plan of care and reports that she has follow up lab work scheduled next week for re-evaluation of her blood count; confirms that she will use established transportation services to attend this appointment.  Confirms that PCP reviewed  her medications with she and her caregiver this morning  -- denies medication review today, stating that since the PCP office visit this morning, her caregiver/ daughter "has stepped out;" confirms that she has no medication concerns and that her daughter continues managing all aspects of her medication management.  Also adds that medications were reviewed by home health RN yesterday   -- home health services continue for RN, dressing changes/ foot wound assessment completed with each visit; patient reports RN visited her at home yesterday; states RN reports that leg/ foot wound "looks great, almost healed up."  -- weekly visits to wound clinic "going fine;" continues using established transportation services   Reports recent stent placed in leg has "helped a lot," around her chronic pain and she also believes that wound is healing  -- continues using home O2 at 2 L/min; discussed with patient how anemia can impair oxygen delivery, and discussed signs/ symptoms anemia with patient; reiterated action plan for yellow COPD zone; confirmed that patient is taking iron supplementation  -- confirms that her daughter continues monitoring and recording blood sugars and administering insulin; reports "no changes" and again states that blood sugars "are always all over the place" with values recorded at home; she verbalizes that she accepts her blood sugars as they are and again verbalizes that she does not really wish to work on diabetes, as "I have had it so long, there is nothing any one can do," despite my encouragement that she could make personal changes that might improve her blood sugar readings, she is not interested.  Patientdeniesfurther issues, concerns, or problems today. Iconfirmed thatpatienthasmy direct phone number, the main [facility] CM office phone number, and the [facility] CM 24-hour nurse advice phone  number should issues arise prior to next scheduled [facility] RN CM outreach next month.Encouraged  patient or caregiverto contact me directly if needs, questions, issues, or concerns arise prior to next scheduled outreach;sheagreed to do so.  Plan:  Patient will take medications as prescribed and will attend all scheduled provider appointments  Patient will promptly notify care providers for any new concerns/ issues/ problems that arise  Patient will actively participate in home health services as ordered post-hospital discharge  Patient's caregiver will continue to monitor and record blood sugars 3-4 times per day   I will place printed educational material in mail to patient around anemia  [place] outreachto continue with scheduled phone call  [place] Problem One     Most Recent Value  Care Plan Problem One  Ongoing management of chronic foot wound, as evidenced by patient reporting  Role Documenting the Problem One  Care Management Coordinator  Care Plan for Problem One  Active  [facility]N Long Term Goal   Over the next 60 days, patient will continue attending weekly wound clinic visits, as evidenced by patient reporting and collaboration with care providers as indicated during [facility] RN CM outreach  [facility] Long Term Goal Start Date  04/28/19  Interventions for Problem One Long Term Goal  Confirmed with patient that she has continued attending wound clinic visits as scheduled,  discussed recent visits with patient and confirmed that she has ongoing reliable transportation to office visits  [facility] CM Short Term Goal #1   Over the next 30 days, patient will continue to participate in home health RN visits for wound management, as evidenced by patient reporting and collaboration with home health team as indicated during [facility] RN CM outreach  [facility] CM Short Term Goal #1 Start Date  04/28/19  Interventions for Short Term Goal #1  Confirmed with patient that home health RN continues to visit for wound assessment,  discussed recent home health visits with patient    [facility] CM Care  Plan Problem Two     Most Recent Value  Care Plan Problem Two  High risk for hospital re-admission related to/ as evidenced by recent unplanned admission for COPD exacerbation secondary to anemia  Role Documenting the Problem Two  Care Management Coordinator  Care Plan for Problem Two  Active  Interventions for Problem Two Long Term Goal   Discussed with patient her recent hospitalization and discharge home,  confirmed that patient has no current clinical concerns and that homehealth services continue,  discussed today's PCP office visit with patient and confirmed that she has all of her prescribed medications and that medications were reviewed with patient and caregiver/ daughter during PCP office visit this morning and by home health RN yesterday  [facility]N Long Term Goal  Over the next 31 days, patient will not experience unplanned hospital re-admission, as evidenced by patient reporting and review of EMR during [facility] RN CM outreach  [facility] Long Term Goal Start Date  05/10/19     Oneta Rack, RN, BSN, [employer] Coordinator [facility] Care Management  [phone]

## 2019-05-11 DIAGNOSIS — I5032 Chronic diastolic (congestive) heart failure: Secondary | ICD-10-CM | POA: Diagnosis not present

## 2019-05-11 DIAGNOSIS — J449 Chronic obstructive pulmonary disease, unspecified: Secondary | ICD-10-CM | POA: Diagnosis not present

## 2019-05-11 DIAGNOSIS — L97412 Non-pressure chronic ulcer of right heel and midfoot with fat layer exposed: Secondary | ICD-10-CM | POA: Diagnosis not present

## 2019-05-11 DIAGNOSIS — E1151 Type 2 diabetes mellitus with diabetic peripheral angiopathy without gangrene: Secondary | ICD-10-CM | POA: Diagnosis not present

## 2019-05-11 DIAGNOSIS — Z7982 Long term (current) use of aspirin: Secondary | ICD-10-CM | POA: Diagnosis not present

## 2019-05-11 DIAGNOSIS — J9611 Chronic respiratory failure with hypoxia: Secondary | ICD-10-CM | POA: Diagnosis not present

## 2019-05-11 DIAGNOSIS — Z9981 Dependence on supplemental oxygen: Secondary | ICD-10-CM | POA: Diagnosis not present

## 2019-05-11 DIAGNOSIS — J9612 Chronic respiratory failure with hypercapnia: Secondary | ICD-10-CM | POA: Diagnosis not present

## 2019-05-11 DIAGNOSIS — M8580 Other specified disorders of bone density and structure, unspecified site: Secondary | ICD-10-CM | POA: Diagnosis not present

## 2019-05-11 DIAGNOSIS — E46 Unspecified protein-calorie malnutrition: Secondary | ICD-10-CM | POA: Diagnosis not present

## 2019-05-11 DIAGNOSIS — M159 Polyosteoarthritis, unspecified: Secondary | ICD-10-CM | POA: Diagnosis not present

## 2019-05-11 DIAGNOSIS — R911 Solitary pulmonary nodule: Secondary | ICD-10-CM | POA: Diagnosis not present

## 2019-05-11 DIAGNOSIS — I110 Hypertensive heart disease with heart failure: Secondary | ICD-10-CM | POA: Diagnosis not present

## 2019-05-11 DIAGNOSIS — Z87440 Personal history of urinary (tract) infections: Secondary | ICD-10-CM | POA: Diagnosis not present

## 2019-05-11 DIAGNOSIS — K219 Gastro-esophageal reflux disease without esophagitis: Secondary | ICD-10-CM | POA: Diagnosis not present

## 2019-05-11 DIAGNOSIS — G4733 Obstructive sleep apnea (adult) (pediatric): Secondary | ICD-10-CM | POA: Diagnosis not present

## 2019-05-11 DIAGNOSIS — I4510 Unspecified right bundle-branch block: Secondary | ICD-10-CM | POA: Diagnosis not present

## 2019-05-11 DIAGNOSIS — E785 Hyperlipidemia, unspecified: Secondary | ICD-10-CM | POA: Diagnosis not present

## 2019-05-11 DIAGNOSIS — M109 Gout, unspecified: Secondary | ICD-10-CM | POA: Diagnosis not present

## 2019-05-11 DIAGNOSIS — E1142 Type 2 diabetes mellitus with diabetic polyneuropathy: Secondary | ICD-10-CM | POA: Diagnosis not present

## 2019-05-11 DIAGNOSIS — R339 Retention of urine, unspecified: Secondary | ICD-10-CM | POA: Diagnosis not present

## 2019-05-11 DIAGNOSIS — K760 Fatty (change of) liver, not elsewhere classified: Secondary | ICD-10-CM | POA: Diagnosis not present

## 2019-05-11 DIAGNOSIS — D509 Iron deficiency anemia, unspecified: Secondary | ICD-10-CM | POA: Diagnosis not present

## 2019-05-11 DIAGNOSIS — Z794 Long term (current) use of insulin: Secondary | ICD-10-CM | POA: Diagnosis not present

## 2019-05-11 DIAGNOSIS — E11621 Type 2 diabetes mellitus with foot ulcer: Secondary | ICD-10-CM | POA: Diagnosis not present

## 2019-05-16 DIAGNOSIS — E11628 Type 2 diabetes mellitus with other skin complications: Secondary | ICD-10-CM | POA: Diagnosis not present

## 2019-05-16 DIAGNOSIS — L97412 Non-pressure chronic ulcer of right heel and midfoot with fat layer exposed: Secondary | ICD-10-CM | POA: Diagnosis not present

## 2019-05-16 DIAGNOSIS — E11621 Type 2 diabetes mellitus with foot ulcer: Secondary | ICD-10-CM | POA: Diagnosis not present

## 2019-05-16 DIAGNOSIS — I10 Essential (primary) hypertension: Secondary | ICD-10-CM | POA: Diagnosis not present

## 2019-05-16 DIAGNOSIS — L89613 Pressure ulcer of right heel, stage 3: Secondary | ICD-10-CM | POA: Diagnosis not present

## 2019-05-17 DIAGNOSIS — E46 Unspecified protein-calorie malnutrition: Secondary | ICD-10-CM | POA: Diagnosis not present

## 2019-05-17 DIAGNOSIS — Z9981 Dependence on supplemental oxygen: Secondary | ICD-10-CM | POA: Diagnosis not present

## 2019-05-17 DIAGNOSIS — R339 Retention of urine, unspecified: Secondary | ICD-10-CM | POA: Diagnosis not present

## 2019-05-17 DIAGNOSIS — I5032 Chronic diastolic (congestive) heart failure: Secondary | ICD-10-CM | POA: Diagnosis not present

## 2019-05-17 DIAGNOSIS — E11621 Type 2 diabetes mellitus with foot ulcer: Secondary | ICD-10-CM | POA: Diagnosis not present

## 2019-05-17 DIAGNOSIS — Z7982 Long term (current) use of aspirin: Secondary | ICD-10-CM | POA: Diagnosis not present

## 2019-05-17 DIAGNOSIS — E1151 Type 2 diabetes mellitus with diabetic peripheral angiopathy without gangrene: Secondary | ICD-10-CM | POA: Diagnosis not present

## 2019-05-17 DIAGNOSIS — I4510 Unspecified right bundle-branch block: Secondary | ICD-10-CM | POA: Diagnosis not present

## 2019-05-17 DIAGNOSIS — M159 Polyosteoarthritis, unspecified: Secondary | ICD-10-CM | POA: Diagnosis not present

## 2019-05-17 DIAGNOSIS — R911 Solitary pulmonary nodule: Secondary | ICD-10-CM | POA: Diagnosis not present

## 2019-05-17 DIAGNOSIS — E785 Hyperlipidemia, unspecified: Secondary | ICD-10-CM | POA: Diagnosis not present

## 2019-05-17 DIAGNOSIS — I110 Hypertensive heart disease with heart failure: Secondary | ICD-10-CM | POA: Diagnosis not present

## 2019-05-17 DIAGNOSIS — J9611 Chronic respiratory failure with hypoxia: Secondary | ICD-10-CM | POA: Diagnosis not present

## 2019-05-17 DIAGNOSIS — E1142 Type 2 diabetes mellitus with diabetic polyneuropathy: Secondary | ICD-10-CM | POA: Diagnosis not present

## 2019-05-17 DIAGNOSIS — L97412 Non-pressure chronic ulcer of right heel and midfoot with fat layer exposed: Secondary | ICD-10-CM | POA: Diagnosis not present

## 2019-05-17 DIAGNOSIS — M8580 Other specified disorders of bone density and structure, unspecified site: Secondary | ICD-10-CM | POA: Diagnosis not present

## 2019-05-17 DIAGNOSIS — Z794 Long term (current) use of insulin: Secondary | ICD-10-CM | POA: Diagnosis not present

## 2019-05-17 DIAGNOSIS — K760 Fatty (change of) liver, not elsewhere classified: Secondary | ICD-10-CM | POA: Diagnosis not present

## 2019-05-17 DIAGNOSIS — G4733 Obstructive sleep apnea (adult) (pediatric): Secondary | ICD-10-CM | POA: Diagnosis not present

## 2019-05-17 DIAGNOSIS — J449 Chronic obstructive pulmonary disease, unspecified: Secondary | ICD-10-CM | POA: Diagnosis not present

## 2019-05-17 DIAGNOSIS — D509 Iron deficiency anemia, unspecified: Secondary | ICD-10-CM | POA: Diagnosis not present

## 2019-05-17 DIAGNOSIS — Z87440 Personal history of urinary (tract) infections: Secondary | ICD-10-CM | POA: Diagnosis not present

## 2019-05-17 DIAGNOSIS — J9612 Chronic respiratory failure with hypercapnia: Secondary | ICD-10-CM | POA: Diagnosis not present

## 2019-05-17 DIAGNOSIS — M109 Gout, unspecified: Secondary | ICD-10-CM | POA: Diagnosis not present

## 2019-05-17 DIAGNOSIS — K219 Gastro-esophageal reflux disease without esophagitis: Secondary | ICD-10-CM | POA: Diagnosis not present

## 2019-05-18 DIAGNOSIS — Z9981 Dependence on supplemental oxygen: Secondary | ICD-10-CM | POA: Diagnosis not present

## 2019-05-18 DIAGNOSIS — I110 Hypertensive heart disease with heart failure: Secondary | ICD-10-CM | POA: Diagnosis not present

## 2019-05-18 DIAGNOSIS — M159 Polyosteoarthritis, unspecified: Secondary | ICD-10-CM | POA: Diagnosis not present

## 2019-05-18 DIAGNOSIS — G4733 Obstructive sleep apnea (adult) (pediatric): Secondary | ICD-10-CM | POA: Diagnosis not present

## 2019-05-18 DIAGNOSIS — L97412 Non-pressure chronic ulcer of right heel and midfoot with fat layer exposed: Secondary | ICD-10-CM | POA: Diagnosis not present

## 2019-05-18 DIAGNOSIS — M109 Gout, unspecified: Secondary | ICD-10-CM | POA: Diagnosis not present

## 2019-05-18 DIAGNOSIS — K760 Fatty (change of) liver, not elsewhere classified: Secondary | ICD-10-CM | POA: Diagnosis not present

## 2019-05-18 DIAGNOSIS — Z7982 Long term (current) use of aspirin: Secondary | ICD-10-CM | POA: Diagnosis not present

## 2019-05-18 DIAGNOSIS — K219 Gastro-esophageal reflux disease without esophagitis: Secondary | ICD-10-CM | POA: Diagnosis not present

## 2019-05-18 DIAGNOSIS — E1142 Type 2 diabetes mellitus with diabetic polyneuropathy: Secondary | ICD-10-CM | POA: Diagnosis not present

## 2019-05-18 DIAGNOSIS — D649 Anemia, unspecified: Secondary | ICD-10-CM | POA: Diagnosis not present

## 2019-05-18 DIAGNOSIS — J449 Chronic obstructive pulmonary disease, unspecified: Secondary | ICD-10-CM | POA: Diagnosis not present

## 2019-05-18 DIAGNOSIS — Z794 Long term (current) use of insulin: Secondary | ICD-10-CM | POA: Diagnosis not present

## 2019-05-18 DIAGNOSIS — E11621 Type 2 diabetes mellitus with foot ulcer: Secondary | ICD-10-CM | POA: Diagnosis not present

## 2019-05-18 DIAGNOSIS — D509 Iron deficiency anemia, unspecified: Secondary | ICD-10-CM | POA: Diagnosis not present

## 2019-05-18 DIAGNOSIS — E46 Unspecified protein-calorie malnutrition: Secondary | ICD-10-CM | POA: Diagnosis not present

## 2019-05-18 DIAGNOSIS — R911 Solitary pulmonary nodule: Secondary | ICD-10-CM | POA: Diagnosis not present

## 2019-05-18 DIAGNOSIS — J9612 Chronic respiratory failure with hypercapnia: Secondary | ICD-10-CM | POA: Diagnosis not present

## 2019-05-18 DIAGNOSIS — K922 Gastrointestinal hemorrhage, unspecified: Secondary | ICD-10-CM | POA: Diagnosis not present

## 2019-05-18 DIAGNOSIS — J9611 Chronic respiratory failure with hypoxia: Secondary | ICD-10-CM | POA: Diagnosis not present

## 2019-05-18 DIAGNOSIS — E1151 Type 2 diabetes mellitus with diabetic peripheral angiopathy without gangrene: Secondary | ICD-10-CM | POA: Diagnosis not present

## 2019-05-18 DIAGNOSIS — R339 Retention of urine, unspecified: Secondary | ICD-10-CM | POA: Diagnosis not present

## 2019-05-18 DIAGNOSIS — M8580 Other specified disorders of bone density and structure, unspecified site: Secondary | ICD-10-CM | POA: Diagnosis not present

## 2019-05-18 DIAGNOSIS — I10 Essential (primary) hypertension: Secondary | ICD-10-CM | POA: Diagnosis not present

## 2019-05-18 DIAGNOSIS — E785 Hyperlipidemia, unspecified: Secondary | ICD-10-CM | POA: Diagnosis not present

## 2019-05-18 DIAGNOSIS — Z87440 Personal history of urinary (tract) infections: Secondary | ICD-10-CM | POA: Diagnosis not present

## 2019-05-18 DIAGNOSIS — I5032 Chronic diastolic (congestive) heart failure: Secondary | ICD-10-CM | POA: Diagnosis not present

## 2019-05-18 DIAGNOSIS — E118 Type 2 diabetes mellitus with unspecified complications: Secondary | ICD-10-CM | POA: Diagnosis not present

## 2019-05-18 DIAGNOSIS — I4510 Unspecified right bundle-branch block: Secondary | ICD-10-CM | POA: Diagnosis not present

## 2019-05-21 DIAGNOSIS — D500 Iron deficiency anemia secondary to blood loss (chronic): Secondary | ICD-10-CM | POA: Diagnosis not present

## 2019-05-21 DIAGNOSIS — M79671 Pain in right foot: Secondary | ICD-10-CM | POA: Diagnosis not present

## 2019-05-21 DIAGNOSIS — I361 Nonrheumatic tricuspid (valve) insufficiency: Secondary | ICD-10-CM

## 2019-05-21 DIAGNOSIS — E875 Hyperkalemia: Secondary | ICD-10-CM

## 2019-05-21 DIAGNOSIS — R609 Edema, unspecified: Secondary | ICD-10-CM | POA: Diagnosis not present

## 2019-05-21 DIAGNOSIS — J449 Chronic obstructive pulmonary disease, unspecified: Secondary | ICD-10-CM | POA: Diagnosis not present

## 2019-05-21 DIAGNOSIS — K5520 Angiodysplasia of colon without hemorrhage: Secondary | ICD-10-CM | POA: Diagnosis not present

## 2019-05-21 DIAGNOSIS — G9341 Metabolic encephalopathy: Secondary | ICD-10-CM | POA: Diagnosis not present

## 2019-05-21 DIAGNOSIS — I4510 Unspecified right bundle-branch block: Secondary | ICD-10-CM | POA: Diagnosis not present

## 2019-05-21 DIAGNOSIS — L97419 Non-pressure chronic ulcer of right heel and midfoot with unspecified severity: Secondary | ICD-10-CM | POA: Diagnosis not present

## 2019-05-21 DIAGNOSIS — R52 Pain, unspecified: Secondary | ICD-10-CM | POA: Diagnosis not present

## 2019-05-21 DIAGNOSIS — Z20822 Contact with and (suspected) exposure to covid-19: Secondary | ICD-10-CM | POA: Diagnosis not present

## 2019-05-21 DIAGNOSIS — D649 Anemia, unspecified: Secondary | ICD-10-CM | POA: Diagnosis not present

## 2019-05-21 DIAGNOSIS — I959 Hypotension, unspecified: Secondary | ICD-10-CM | POA: Diagnosis not present

## 2019-05-21 DIAGNOSIS — R0902 Hypoxemia: Secondary | ICD-10-CM | POA: Diagnosis not present

## 2019-05-21 DIAGNOSIS — J9811 Atelectasis: Secondary | ICD-10-CM | POA: Diagnosis not present

## 2019-05-22 DIAGNOSIS — J440 Chronic obstructive pulmonary disease with acute lower respiratory infection: Secondary | ICD-10-CM | POA: Diagnosis not present

## 2019-05-22 DIAGNOSIS — D649 Anemia, unspecified: Secondary | ICD-10-CM | POA: Diagnosis not present

## 2019-05-22 DIAGNOSIS — I959 Hypotension, unspecified: Secondary | ICD-10-CM | POA: Diagnosis not present

## 2019-05-22 DIAGNOSIS — I110 Hypertensive heart disease with heart failure: Secondary | ICD-10-CM | POA: Diagnosis not present

## 2019-05-22 DIAGNOSIS — Z9981 Dependence on supplemental oxygen: Secondary | ICD-10-CM | POA: Diagnosis not present

## 2019-05-22 DIAGNOSIS — E1151 Type 2 diabetes mellitus with diabetic peripheral angiopathy without gangrene: Secondary | ICD-10-CM | POA: Diagnosis not present

## 2019-05-22 DIAGNOSIS — J9811 Atelectasis: Secondary | ICD-10-CM | POA: Diagnosis not present

## 2019-05-22 DIAGNOSIS — D638 Anemia in other chronic diseases classified elsewhere: Secondary | ICD-10-CM | POA: Diagnosis not present

## 2019-05-22 DIAGNOSIS — R0902 Hypoxemia: Secondary | ICD-10-CM | POA: Diagnosis not present

## 2019-05-22 DIAGNOSIS — I5032 Chronic diastolic (congestive) heart failure: Secondary | ICD-10-CM | POA: Diagnosis not present

## 2019-05-22 DIAGNOSIS — E11621 Type 2 diabetes mellitus with foot ulcer: Secondary | ICD-10-CM | POA: Diagnosis not present

## 2019-05-22 DIAGNOSIS — Z20822 Contact with and (suspected) exposure to covid-19: Secondary | ICD-10-CM | POA: Diagnosis not present

## 2019-05-22 DIAGNOSIS — M109 Gout, unspecified: Secondary | ICD-10-CM | POA: Diagnosis not present

## 2019-05-22 DIAGNOSIS — M1990 Unspecified osteoarthritis, unspecified site: Secondary | ICD-10-CM | POA: Diagnosis not present

## 2019-05-22 DIAGNOSIS — D500 Iron deficiency anemia secondary to blood loss (chronic): Secondary | ICD-10-CM | POA: Diagnosis not present

## 2019-05-22 DIAGNOSIS — Z950 Presence of cardiac pacemaker: Secondary | ICD-10-CM | POA: Diagnosis not present

## 2019-05-22 DIAGNOSIS — I071 Rheumatic tricuspid insufficiency: Secondary | ICD-10-CM | POA: Diagnosis not present

## 2019-05-22 DIAGNOSIS — M2550 Pain in unspecified joint: Secondary | ICD-10-CM | POA: Diagnosis not present

## 2019-05-22 DIAGNOSIS — M79671 Pain in right foot: Secondary | ICD-10-CM | POA: Diagnosis not present

## 2019-05-22 DIAGNOSIS — R5381 Other malaise: Secondary | ICD-10-CM | POA: Diagnosis not present

## 2019-05-22 DIAGNOSIS — E875 Hyperkalemia: Secondary | ICD-10-CM | POA: Diagnosis not present

## 2019-05-22 DIAGNOSIS — L304 Erythema intertrigo: Secondary | ICD-10-CM | POA: Diagnosis not present

## 2019-05-22 DIAGNOSIS — J449 Chronic obstructive pulmonary disease, unspecified: Secondary | ICD-10-CM | POA: Diagnosis not present

## 2019-05-22 DIAGNOSIS — B952 Enterococcus as the cause of diseases classified elsewhere: Secondary | ICD-10-CM | POA: Diagnosis not present

## 2019-05-22 DIAGNOSIS — Z7401 Bed confinement status: Secondary | ICD-10-CM | POA: Diagnosis not present

## 2019-05-22 DIAGNOSIS — L89610 Pressure ulcer of right heel, unstageable: Secondary | ICD-10-CM | POA: Diagnosis not present

## 2019-05-22 DIAGNOSIS — G9341 Metabolic encephalopathy: Secondary | ICD-10-CM | POA: Diagnosis not present

## 2019-05-22 DIAGNOSIS — Z951 Presence of aortocoronary bypass graft: Secondary | ICD-10-CM | POA: Diagnosis not present

## 2019-05-22 DIAGNOSIS — R531 Weakness: Secondary | ICD-10-CM | POA: Diagnosis not present

## 2019-05-22 DIAGNOSIS — I2510 Atherosclerotic heart disease of native coronary artery without angina pectoris: Secondary | ICD-10-CM | POA: Diagnosis not present

## 2019-05-22 DIAGNOSIS — I252 Old myocardial infarction: Secondary | ICD-10-CM | POA: Diagnosis not present

## 2019-05-22 DIAGNOSIS — K219 Gastro-esophageal reflux disease without esophagitis: Secondary | ICD-10-CM | POA: Diagnosis not present

## 2019-05-22 DIAGNOSIS — N390 Urinary tract infection, site not specified: Secondary | ICD-10-CM | POA: Diagnosis not present

## 2019-05-22 DIAGNOSIS — E7800 Pure hypercholesterolemia, unspecified: Secondary | ICD-10-CM | POA: Diagnosis not present

## 2019-05-22 DIAGNOSIS — K5520 Angiodysplasia of colon without hemorrhage: Secondary | ICD-10-CM | POA: Diagnosis not present

## 2019-05-22 DIAGNOSIS — R509 Fever, unspecified: Secondary | ICD-10-CM | POA: Diagnosis not present

## 2019-05-22 DIAGNOSIS — L97419 Non-pressure chronic ulcer of right heel and midfoot with unspecified severity: Secondary | ICD-10-CM | POA: Diagnosis not present

## 2019-05-23 DIAGNOSIS — L97419 Non-pressure chronic ulcer of right heel and midfoot with unspecified severity: Secondary | ICD-10-CM | POA: Diagnosis not present

## 2019-05-23 DIAGNOSIS — J449 Chronic obstructive pulmonary disease, unspecified: Secondary | ICD-10-CM | POA: Diagnosis not present

## 2019-05-23 DIAGNOSIS — D500 Iron deficiency anemia secondary to blood loss (chronic): Secondary | ICD-10-CM | POA: Diagnosis not present

## 2019-05-23 DIAGNOSIS — G9341 Metabolic encephalopathy: Secondary | ICD-10-CM | POA: Diagnosis not present

## 2019-05-25 ENCOUNTER — Encounter: Payer: Self-pay | Admitting: *Deleted

## 2019-05-25 ENCOUNTER — Other Ambulatory Visit: Payer: Self-pay | Admitting: *Deleted

## 2019-05-25 DIAGNOSIS — E1142 Type 2 diabetes mellitus with diabetic polyneuropathy: Secondary | ICD-10-CM | POA: Diagnosis not present

## 2019-05-25 DIAGNOSIS — E46 Unspecified protein-calorie malnutrition: Secondary | ICD-10-CM | POA: Diagnosis not present

## 2019-05-25 DIAGNOSIS — M159 Polyosteoarthritis, unspecified: Secondary | ICD-10-CM | POA: Diagnosis not present

## 2019-05-25 DIAGNOSIS — E1151 Type 2 diabetes mellitus with diabetic peripheral angiopathy without gangrene: Secondary | ICD-10-CM | POA: Diagnosis not present

## 2019-05-25 DIAGNOSIS — Z7982 Long term (current) use of aspirin: Secondary | ICD-10-CM | POA: Diagnosis not present

## 2019-05-25 DIAGNOSIS — I4510 Unspecified right bundle-branch block: Secondary | ICD-10-CM | POA: Diagnosis not present

## 2019-05-25 DIAGNOSIS — I5032 Chronic diastolic (congestive) heart failure: Secondary | ICD-10-CM | POA: Diagnosis not present

## 2019-05-25 DIAGNOSIS — G4733 Obstructive sleep apnea (adult) (pediatric): Secondary | ICD-10-CM | POA: Diagnosis not present

## 2019-05-25 DIAGNOSIS — M8580 Other specified disorders of bone density and structure, unspecified site: Secondary | ICD-10-CM | POA: Diagnosis not present

## 2019-05-25 DIAGNOSIS — E11621 Type 2 diabetes mellitus with foot ulcer: Secondary | ICD-10-CM | POA: Diagnosis not present

## 2019-05-25 DIAGNOSIS — Z9981 Dependence on supplemental oxygen: Secondary | ICD-10-CM | POA: Diagnosis not present

## 2019-05-25 DIAGNOSIS — J449 Chronic obstructive pulmonary disease, unspecified: Secondary | ICD-10-CM | POA: Diagnosis not present

## 2019-05-25 DIAGNOSIS — Z794 Long term (current) use of insulin: Secondary | ICD-10-CM | POA: Diagnosis not present

## 2019-05-25 DIAGNOSIS — L97412 Non-pressure chronic ulcer of right heel and midfoot with fat layer exposed: Secondary | ICD-10-CM | POA: Diagnosis not present

## 2019-05-25 DIAGNOSIS — D509 Iron deficiency anemia, unspecified: Secondary | ICD-10-CM | POA: Diagnosis not present

## 2019-05-25 DIAGNOSIS — E785 Hyperlipidemia, unspecified: Secondary | ICD-10-CM | POA: Diagnosis not present

## 2019-05-25 DIAGNOSIS — K219 Gastro-esophageal reflux disease without esophagitis: Secondary | ICD-10-CM | POA: Diagnosis not present

## 2019-05-25 DIAGNOSIS — M109 Gout, unspecified: Secondary | ICD-10-CM | POA: Diagnosis not present

## 2019-05-25 DIAGNOSIS — Z87440 Personal history of urinary (tract) infections: Secondary | ICD-10-CM | POA: Diagnosis not present

## 2019-05-25 DIAGNOSIS — R911 Solitary pulmonary nodule: Secondary | ICD-10-CM | POA: Diagnosis not present

## 2019-05-25 DIAGNOSIS — J9612 Chronic respiratory failure with hypercapnia: Secondary | ICD-10-CM | POA: Diagnosis not present

## 2019-05-25 DIAGNOSIS — K760 Fatty (change of) liver, not elsewhere classified: Secondary | ICD-10-CM | POA: Diagnosis not present

## 2019-05-25 DIAGNOSIS — J9611 Chronic respiratory failure with hypoxia: Secondary | ICD-10-CM | POA: Diagnosis not present

## 2019-05-25 DIAGNOSIS — I110 Hypertensive heart disease with heart failure: Secondary | ICD-10-CM | POA: Diagnosis not present

## 2019-05-25 DIAGNOSIS — R339 Retention of urine, unspecified: Secondary | ICD-10-CM | POA: Diagnosis not present

## 2019-05-25 NOTE — Patient Outreach (Signed)
[facility]) [place] Telephone Outreach PCP office completes Transition of Care follow up post-[facility] discharge Post-[facility] discharge day # 2  05/25/2019  Cassandra Barnes [birth year] [id number]  Successful telephone outreach to Cassandra [facility], 74 y/o female with recent hospitalization at [facility] 13, 2018 for AMS/ sepsis/ hypoxic respiratory failure related to pneumonia/ UTI; patient was discharged from [facility] to SN[facility] for rehabilitation and was apparently recently discharged from SNF back home to self carewith home health services in place. Patient has history including, but not limited to, COPD, on home O2; OSA with CPAP; DM; and morbid obesity.  Received notification from [facility] Liaison that patient experienced [facility] re[facility]admission at [facility] and was discharged home to self-care on May 08, 2019 with ongoing home health services; unfortunately, received another message that patient was subsequently re-admitted to [facility] 13 days later, February 14-16, 2021.  HIPAA/ identity verified with patient who reports that she was hospitalized for ongoing anemia; patient reports that her "foot started hurting" and her daughter/ caregiver yells in the back ground that patient was very drowsy; patient states that they called EMS and when at [facility] patient was diagnosed with "possible pneumonia and UTI and low blood count," stating that she was given one unit of blood and discharged home on newly prescribed antibiotics.  Patient denies clinical concerns and sounds to be in no distress throughout phone call today.  Patient further reports:  -- had PCP office visit prior to [facility] readmission for follow up lab work from previous hospitalization on 05/18/19; also scheduled next week for another PCP office visit with re-evaluation of her blood count; confirms that she will use established transportation services  to attend this appointment.    -- again declines medication review today, stating that her daughter/ caregiver is too busy; encouraged patient to consider medication review with next outreach after upcoming PCP appointment; attempted to schedule at caregiver's preference, however, caregiver yells into phone that she "doesn't care and is too busy too worry about it now."  Medication list in EMR updated to reflect newly prescribed antibiotic post-most recent [facility] discharge  -- home health services continue for RN, dressing changes/ foot wound assessment completed with each visit; patient reports RN visited her at home this moring; again states RN reported to patient that leg/ foot wound "looks great, almost healed up."  -- weekly visits to wound clinic"going fine;" continues using established transportation services; reports attended last visit 05/16/19 and next visit scheduled for "sometime next week"   -- continues using home O2 at 2 L/min; reiterated with patient previously provided education around anemia/  action plan, and added education around signs/ symptoms UTI/ action plan; today, patient reports for the first time that "she has had problems with anemia for years," and that this "is not a new problem." Patient stated that "there is nothing" she can do, as these episodes of blood loss/ UTI "just happen without any warning," and she insists that she 'was fine one minute" and then "sick the next"  -- confirms that her daughter continues monitoring and recording blood sugars and administering insulin; reports "no changes" and again states that blood sugars "are always all over the place and up and down like they always are."  As usual, patient has no idea around what her blood sugar trends are, but states that her fasting blood sugar this morning was "170-something."  Caregiver has historically been unavailable and unwilling to review blood sugars.  Patient again reports no concerns around her  blood sugars and simply states, "this is the way it has always been and there is nothing anybody can do"  Patientdeniesfurther issues, concerns, or problems today. Iconfirmed thatpatienthasmy direct phone number, the main [facility] CM office phone number, and the [facility] CM 24-hour nurse advice phone number should issues arise prior to next scheduled [facility] outreach next week post- PCP office visit, hopefully for medication review with caregiver/ daughter.Encouraged patient or caregiverto contact me directly if needs, questions, issues, or concerns arise prior to next scheduled outreach;sheagreed to do so.  Plan:  Patient will take medications as prescribed and will attend all scheduled provider appointments  Patient will promptly notify care providers for any new concerns/ issues/ problems that arise  Patient will actively participate in home health services as ordered post-[facility] discharge  Patient's caregiver will continue to monitor and record blood sugars 3-4 times per day   [facility] CM outreachto continue with scheduled phone call next week post- upcoming PCP appointment  [facility] CM Care Plan Problem One     Most Recent Value  Care Plan Problem One  Ongoing management of chronic foot wound, as evidenced by patient reporting  Role Documenting the Problem One  Care Management [place] for Problem One  Active  [facility]N Long Term Goal   Over the next 60 days, patient will continue attending weekly wound clinic visits, as evidenced by patient reporting and collaboration with care providers as indicated during [facility] RN CM outreach  [facility] Long Term Goal Start Date  04/28/19  Interventions for Problem One Long Term Goal  Confirmed with patient that she has continued to go to weekly wound clinic visits and contiues to use established transportation services,  confirmed that per patient report, her wound care providers have told her the wound is healing very well  [facility] CM  Short Term Goal #1   Over the next 30 days, patient will continue to participate in home health RN visits for wound management, as evidenced by patient reporting and collaboration with home health team as indicated during [facility] RN CM outreach  [facility] CM Short Term Goal #1 Start Date  05/25/19 [facility] extended/ re-established]  Interventions for Short Term Goal #1  Confirmed that home health nurse continues to visit patient regularly,  reviewed with patient recent visits,  confirmed that patient was visited this morning by nurse and was told that her wound "looked great"    [facility] CM Care Plan Problem Two     Most Recent Value  Care Plan Problem Two  High risk for [facility] re-admission related to/ as evidenced by recent unplanned admission for COPD exacerbation/ pneumonia secondary to ongoing anemia with UTI   Role Documenting the Problem Two  Care Management Coordinator  Care Plan for Problem Two  Active  Interventions for Problem Two Long Term Goal   Discussed with patient her recent unplanned [facility] re-admission,  confirmed that patient had PCP office visit last week on 05/18/19, and confirmed that she will return back to PCP for another office visit next week on 06/01/19,  confirmed that she will use established transportation services,  discussed with patient events leading up to her [facility] re-admission and reiterated signs/ symptoms anemia and UTI along with corresponding action plan,  updated medication list to reflect patient's report of newly prescribed medications post-most recent [facility] discharge    [facility] Long Term Goal  Over the next 31 days, patient will not experience unplanned [facility] re-admission,  as evidenced by patient reporting and review of EMR during [facility] RN CM outreach [Goal re-established today aorund recent [facility] readmission]  [place] Term Goal Start Date  05/25/19 [Goal not met/ re-established today]     Oneta Rack, RN, BSN, [place] Coordinator [facility]  Care Management  [phone]

## 2019-05-26 ENCOUNTER — Ambulatory Visit: Payer: Self-pay | Admitting: *Deleted

## 2019-05-28 DIAGNOSIS — J449 Chronic obstructive pulmonary disease, unspecified: Secondary | ICD-10-CM | POA: Diagnosis not present

## 2019-05-28 DIAGNOSIS — J9621 Acute and chronic respiratory failure with hypoxia: Secondary | ICD-10-CM | POA: Diagnosis not present

## 2019-05-29 DIAGNOSIS — Z9981 Dependence on supplemental oxygen: Secondary | ICD-10-CM | POA: Diagnosis not present

## 2019-05-29 DIAGNOSIS — K760 Fatty (change of) liver, not elsewhere classified: Secondary | ICD-10-CM | POA: Diagnosis not present

## 2019-05-29 DIAGNOSIS — K219 Gastro-esophageal reflux disease without esophagitis: Secondary | ICD-10-CM | POA: Diagnosis not present

## 2019-05-29 DIAGNOSIS — J9612 Chronic respiratory failure with hypercapnia: Secondary | ICD-10-CM | POA: Diagnosis not present

## 2019-05-29 DIAGNOSIS — Z7982 Long term (current) use of aspirin: Secondary | ICD-10-CM | POA: Diagnosis not present

## 2019-05-29 DIAGNOSIS — E46 Unspecified protein-calorie malnutrition: Secondary | ICD-10-CM | POA: Diagnosis not present

## 2019-05-29 DIAGNOSIS — J9611 Chronic respiratory failure with hypoxia: Secondary | ICD-10-CM | POA: Diagnosis not present

## 2019-05-29 DIAGNOSIS — L97412 Non-pressure chronic ulcer of right heel and midfoot with fat layer exposed: Secondary | ICD-10-CM | POA: Diagnosis not present

## 2019-05-29 DIAGNOSIS — R911 Solitary pulmonary nodule: Secondary | ICD-10-CM | POA: Diagnosis not present

## 2019-05-29 DIAGNOSIS — G4733 Obstructive sleep apnea (adult) (pediatric): Secondary | ICD-10-CM | POA: Diagnosis not present

## 2019-05-29 DIAGNOSIS — E1142 Type 2 diabetes mellitus with diabetic polyneuropathy: Secondary | ICD-10-CM | POA: Diagnosis not present

## 2019-05-29 DIAGNOSIS — Z87440 Personal history of urinary (tract) infections: Secondary | ICD-10-CM | POA: Diagnosis not present

## 2019-05-29 DIAGNOSIS — Z794 Long term (current) use of insulin: Secondary | ICD-10-CM | POA: Diagnosis not present

## 2019-05-29 DIAGNOSIS — R339 Retention of urine, unspecified: Secondary | ICD-10-CM | POA: Diagnosis not present

## 2019-05-29 DIAGNOSIS — M159 Polyosteoarthritis, unspecified: Secondary | ICD-10-CM | POA: Diagnosis not present

## 2019-05-29 DIAGNOSIS — E785 Hyperlipidemia, unspecified: Secondary | ICD-10-CM | POA: Diagnosis not present

## 2019-05-29 DIAGNOSIS — E11621 Type 2 diabetes mellitus with foot ulcer: Secondary | ICD-10-CM | POA: Diagnosis not present

## 2019-05-29 DIAGNOSIS — M8580 Other specified disorders of bone density and structure, unspecified site: Secondary | ICD-10-CM | POA: Diagnosis not present

## 2019-05-29 DIAGNOSIS — J449 Chronic obstructive pulmonary disease, unspecified: Secondary | ICD-10-CM | POA: Diagnosis not present

## 2019-05-29 DIAGNOSIS — I5032 Chronic diastolic (congestive) heart failure: Secondary | ICD-10-CM | POA: Diagnosis not present

## 2019-05-29 DIAGNOSIS — D509 Iron deficiency anemia, unspecified: Secondary | ICD-10-CM | POA: Diagnosis not present

## 2019-05-29 DIAGNOSIS — I4510 Unspecified right bundle-branch block: Secondary | ICD-10-CM | POA: Diagnosis not present

## 2019-05-29 DIAGNOSIS — I110 Hypertensive heart disease with heart failure: Secondary | ICD-10-CM | POA: Diagnosis not present

## 2019-05-29 DIAGNOSIS — M109 Gout, unspecified: Secondary | ICD-10-CM | POA: Diagnosis not present

## 2019-05-29 DIAGNOSIS — E1151 Type 2 diabetes mellitus with diabetic peripheral angiopathy without gangrene: Secondary | ICD-10-CM | POA: Diagnosis not present

## 2019-05-31 DIAGNOSIS — Z87440 Personal history of urinary (tract) infections: Secondary | ICD-10-CM | POA: Diagnosis not present

## 2019-05-31 DIAGNOSIS — M8580 Other specified disorders of bone density and structure, unspecified site: Secondary | ICD-10-CM | POA: Diagnosis not present

## 2019-05-31 DIAGNOSIS — K760 Fatty (change of) liver, not elsewhere classified: Secondary | ICD-10-CM | POA: Diagnosis not present

## 2019-05-31 DIAGNOSIS — G4733 Obstructive sleep apnea (adult) (pediatric): Secondary | ICD-10-CM | POA: Diagnosis not present

## 2019-05-31 DIAGNOSIS — Z7982 Long term (current) use of aspirin: Secondary | ICD-10-CM | POA: Diagnosis not present

## 2019-05-31 DIAGNOSIS — Z794 Long term (current) use of insulin: Secondary | ICD-10-CM | POA: Diagnosis not present

## 2019-05-31 DIAGNOSIS — M159 Polyosteoarthritis, unspecified: Secondary | ICD-10-CM | POA: Diagnosis not present

## 2019-05-31 DIAGNOSIS — R911 Solitary pulmonary nodule: Secondary | ICD-10-CM | POA: Diagnosis not present

## 2019-05-31 DIAGNOSIS — L97412 Non-pressure chronic ulcer of right heel and midfoot with fat layer exposed: Secondary | ICD-10-CM | POA: Diagnosis not present

## 2019-05-31 DIAGNOSIS — D509 Iron deficiency anemia, unspecified: Secondary | ICD-10-CM | POA: Diagnosis not present

## 2019-05-31 DIAGNOSIS — I4510 Unspecified right bundle-branch block: Secondary | ICD-10-CM | POA: Diagnosis not present

## 2019-05-31 DIAGNOSIS — E1142 Type 2 diabetes mellitus with diabetic polyneuropathy: Secondary | ICD-10-CM | POA: Diagnosis not present

## 2019-05-31 DIAGNOSIS — J9611 Chronic respiratory failure with hypoxia: Secondary | ICD-10-CM | POA: Diagnosis not present

## 2019-05-31 DIAGNOSIS — I5032 Chronic diastolic (congestive) heart failure: Secondary | ICD-10-CM | POA: Diagnosis not present

## 2019-05-31 DIAGNOSIS — K219 Gastro-esophageal reflux disease without esophagitis: Secondary | ICD-10-CM | POA: Diagnosis not present

## 2019-05-31 DIAGNOSIS — J449 Chronic obstructive pulmonary disease, unspecified: Secondary | ICD-10-CM | POA: Diagnosis not present

## 2019-05-31 DIAGNOSIS — R339 Retention of urine, unspecified: Secondary | ICD-10-CM | POA: Diagnosis not present

## 2019-05-31 DIAGNOSIS — Z9981 Dependence on supplemental oxygen: Secondary | ICD-10-CM | POA: Diagnosis not present

## 2019-05-31 DIAGNOSIS — E785 Hyperlipidemia, unspecified: Secondary | ICD-10-CM | POA: Diagnosis not present

## 2019-05-31 DIAGNOSIS — I110 Hypertensive heart disease with heart failure: Secondary | ICD-10-CM | POA: Diagnosis not present

## 2019-05-31 DIAGNOSIS — E1151 Type 2 diabetes mellitus with diabetic peripheral angiopathy without gangrene: Secondary | ICD-10-CM | POA: Diagnosis not present

## 2019-05-31 DIAGNOSIS — M109 Gout, unspecified: Secondary | ICD-10-CM | POA: Diagnosis not present

## 2019-05-31 DIAGNOSIS — E11621 Type 2 diabetes mellitus with foot ulcer: Secondary | ICD-10-CM | POA: Diagnosis not present

## 2019-05-31 DIAGNOSIS — J9612 Chronic respiratory failure with hypercapnia: Secondary | ICD-10-CM | POA: Diagnosis not present

## 2019-05-31 DIAGNOSIS — E46 Unspecified protein-calorie malnutrition: Secondary | ICD-10-CM | POA: Diagnosis not present

## 2019-06-01 DIAGNOSIS — R918 Other nonspecific abnormal finding of lung field: Secondary | ICD-10-CM | POA: Diagnosis not present

## 2019-06-01 DIAGNOSIS — I5033 Acute on chronic diastolic (congestive) heart failure: Secondary | ICD-10-CM | POA: Diagnosis not present

## 2019-06-01 DIAGNOSIS — E1165 Type 2 diabetes mellitus with hyperglycemia: Secondary | ICD-10-CM | POA: Diagnosis not present

## 2019-06-01 DIAGNOSIS — E11621 Type 2 diabetes mellitus with foot ulcer: Secondary | ICD-10-CM | POA: Diagnosis not present

## 2019-06-01 DIAGNOSIS — I70209 Unspecified atherosclerosis of native arteries of extremities, unspecified extremity: Secondary | ICD-10-CM | POA: Diagnosis not present

## 2019-06-01 DIAGNOSIS — I739 Peripheral vascular disease, unspecified: Secondary | ICD-10-CM | POA: Diagnosis not present

## 2019-06-01 DIAGNOSIS — R069 Unspecified abnormalities of breathing: Secondary | ICD-10-CM | POA: Diagnosis not present

## 2019-06-01 DIAGNOSIS — M1990 Unspecified osteoarthritis, unspecified site: Secondary | ICD-10-CM | POA: Diagnosis not present

## 2019-06-01 DIAGNOSIS — I959 Hypotension, unspecified: Secondary | ICD-10-CM | POA: Diagnosis not present

## 2019-06-01 DIAGNOSIS — R0689 Other abnormalities of breathing: Secondary | ICD-10-CM | POA: Diagnosis not present

## 2019-06-01 DIAGNOSIS — G9341 Metabolic encephalopathy: Secondary | ICD-10-CM | POA: Diagnosis not present

## 2019-06-01 DIAGNOSIS — R69 Illness, unspecified: Secondary | ICD-10-CM | POA: Diagnosis not present

## 2019-06-01 DIAGNOSIS — Z9981 Dependence on supplemental oxygen: Secondary | ICD-10-CM | POA: Diagnosis not present

## 2019-06-01 DIAGNOSIS — E1151 Type 2 diabetes mellitus with diabetic peripheral angiopathy without gangrene: Secondary | ICD-10-CM | POA: Diagnosis not present

## 2019-06-01 DIAGNOSIS — I2510 Atherosclerotic heart disease of native coronary artery without angina pectoris: Secondary | ICD-10-CM

## 2019-06-01 DIAGNOSIS — E1159 Type 2 diabetes mellitus with other circulatory complications: Secondary | ICD-10-CM | POA: Diagnosis not present

## 2019-06-01 DIAGNOSIS — Z950 Presence of cardiac pacemaker: Secondary | ICD-10-CM | POA: Diagnosis not present

## 2019-06-01 DIAGNOSIS — D509 Iron deficiency anemia, unspecified: Secondary | ICD-10-CM | POA: Diagnosis not present

## 2019-06-01 DIAGNOSIS — L97419 Non-pressure chronic ulcer of right heel and midfoot with unspecified severity: Secondary | ICD-10-CM | POA: Diagnosis not present

## 2019-06-01 DIAGNOSIS — I509 Heart failure, unspecified: Secondary | ICD-10-CM | POA: Diagnosis not present

## 2019-06-01 DIAGNOSIS — M109 Gout, unspecified: Secondary | ICD-10-CM | POA: Diagnosis not present

## 2019-06-01 DIAGNOSIS — E875 Hyperkalemia: Secondary | ICD-10-CM

## 2019-06-01 DIAGNOSIS — J449 Chronic obstructive pulmonary disease, unspecified: Secondary | ICD-10-CM | POA: Diagnosis not present

## 2019-06-01 DIAGNOSIS — J440 Chronic obstructive pulmonary disease with acute lower respiratory infection: Secondary | ICD-10-CM | POA: Diagnosis not present

## 2019-06-01 DIAGNOSIS — K219 Gastro-esophageal reflux disease without esophagitis: Secondary | ICD-10-CM | POA: Diagnosis not present

## 2019-06-01 DIAGNOSIS — J9600 Acute respiratory failure, unspecified whether with hypoxia or hypercapnia: Secondary | ICD-10-CM

## 2019-06-01 DIAGNOSIS — D649 Anemia, unspecified: Secondary | ICD-10-CM

## 2019-06-01 DIAGNOSIS — Z885 Allergy status to narcotic agent status: Secondary | ICD-10-CM | POA: Diagnosis not present

## 2019-06-01 DIAGNOSIS — Z951 Presence of aortocoronary bypass graft: Secondary | ICD-10-CM | POA: Diagnosis not present

## 2019-06-01 DIAGNOSIS — R531 Weakness: Secondary | ICD-10-CM | POA: Diagnosis not present

## 2019-06-01 DIAGNOSIS — J9621 Acute and chronic respiratory failure with hypoxia: Secondary | ICD-10-CM | POA: Diagnosis not present

## 2019-06-01 DIAGNOSIS — J9601 Acute respiratory failure with hypoxia: Secondary | ICD-10-CM | POA: Diagnosis not present

## 2019-06-01 DIAGNOSIS — Z743 Need for continuous supervision: Secondary | ICD-10-CM | POA: Diagnosis not present

## 2019-06-01 DIAGNOSIS — J441 Chronic obstructive pulmonary disease with (acute) exacerbation: Secondary | ICD-10-CM

## 2019-06-01 DIAGNOSIS — D638 Anemia in other chronic diseases classified elsewhere: Secondary | ICD-10-CM | POA: Diagnosis not present

## 2019-06-02 DIAGNOSIS — J9600 Acute respiratory failure, unspecified whether with hypoxia or hypercapnia: Secondary | ICD-10-CM | POA: Diagnosis not present

## 2019-06-02 DIAGNOSIS — J441 Chronic obstructive pulmonary disease with (acute) exacerbation: Secondary | ICD-10-CM | POA: Diagnosis not present

## 2019-06-02 DIAGNOSIS — I2510 Atherosclerotic heart disease of native coronary artery without angina pectoris: Secondary | ICD-10-CM | POA: Diagnosis not present

## 2019-06-02 DIAGNOSIS — E875 Hyperkalemia: Secondary | ICD-10-CM | POA: Diagnosis not present

## 2019-06-03 DIAGNOSIS — I2510 Atherosclerotic heart disease of native coronary artery without angina pectoris: Secondary | ICD-10-CM | POA: Diagnosis not present

## 2019-06-03 DIAGNOSIS — J441 Chronic obstructive pulmonary disease with (acute) exacerbation: Secondary | ICD-10-CM | POA: Diagnosis not present

## 2019-06-03 DIAGNOSIS — E875 Hyperkalemia: Secondary | ICD-10-CM | POA: Diagnosis not present

## 2019-06-03 DIAGNOSIS — J9600 Acute respiratory failure, unspecified whether with hypoxia or hypercapnia: Secondary | ICD-10-CM | POA: Diagnosis not present

## 2019-06-04 DIAGNOSIS — J441 Chronic obstructive pulmonary disease with (acute) exacerbation: Secondary | ICD-10-CM | POA: Diagnosis not present

## 2019-06-04 DIAGNOSIS — E875 Hyperkalemia: Secondary | ICD-10-CM | POA: Diagnosis not present

## 2019-06-04 DIAGNOSIS — J9600 Acute respiratory failure, unspecified whether with hypoxia or hypercapnia: Secondary | ICD-10-CM | POA: Diagnosis not present

## 2019-06-04 DIAGNOSIS — I2510 Atherosclerotic heart disease of native coronary artery without angina pectoris: Secondary | ICD-10-CM | POA: Diagnosis not present

## 2019-06-05 ENCOUNTER — Ambulatory Visit: Payer: Self-pay | Admitting: *Deleted

## 2019-06-05 ENCOUNTER — Other Ambulatory Visit: Payer: Self-pay | Admitting: *Deleted

## 2019-06-05 DIAGNOSIS — J441 Chronic obstructive pulmonary disease with (acute) exacerbation: Secondary | ICD-10-CM | POA: Diagnosis not present

## 2019-06-05 DIAGNOSIS — I2510 Atherosclerotic heart disease of native coronary artery without angina pectoris: Secondary | ICD-10-CM | POA: Diagnosis not present

## 2019-06-05 DIAGNOSIS — J9600 Acute respiratory failure, unspecified whether with hypoxia or hypercapnia: Secondary | ICD-10-CM | POA: Diagnosis not present

## 2019-06-05 DIAGNOSIS — E875 Hyperkalemia: Secondary | ICD-10-CM | POA: Diagnosis not present

## 2019-06-05 NOTE — Patient Outreach (Signed)
[facility]) Care Management [place] Telephone Outreach Care Coordination x 2  06/05/2019  Cassandra [facility] March 12, 1946 [id number]  [facility] CM Telephone Outreach Care Coordination re:  Cassandra [facility], 74 y/o female with recent hospitalization at [facility] December 13, 2018 for AMS/ sepsis/ hypoxic respiratory failure related to pneumonia/ UTI; patient was discharged from hospital to SNF for rehabilitation and was apparently recently discharged from SNF back home to self carewith home health services in place. Patient has history including, but not limited to, COPD, on home O2; OSA with CPAP; DM; and morbid obesity.  Received notification from [facility] Liaison that patient experienced hospital re-admission at [facility] and was discharged home to self-care on February 1, 2021with ongoing home health services; unfortunately, received another message that patient was subsequently re-admitted to [facility] 13 days later, February 14-16, 2021.  Placed care coordination outreach to [place] [phone]); Baker Janus confirms that she has been working with patient for several months and adds that patient was again re-admitted to [facility] on Thursday 06/01/19 with hypoxia, and currently remains admitted with indefinite plans for discharge.  Baker Janus further adds: -- patient's wound healing; eschar gone; visiting patient twice weekly for wound care -- confirms patient is bed bound/ total care and that her daughter "does the best she can" to care for patient -- has spoken to patient around possibility of SNF placement; patient continually refuses placement  Requested that Baker Janus keep my contact information for ongoing care collaboration/ coordination, as indicated and she is agreeable.  Secure communication via EMR was then sent to [place], [place], and [place] as an [place],  notifying of patient's re-admission/ current hospitalization at [place]:  [place] will follow patient's progress while hospitalized and collaborate with [facility] liaison's for discharge disposition and will continue to collaborate with home health RN as indicated  Oneta Rack, RN, BSN, [place] Care Management  [phone]

## 2019-06-06 ENCOUNTER — Other Ambulatory Visit: Payer: Self-pay | Admitting: *Deleted

## 2019-06-06 DIAGNOSIS — E11628 Type 2 diabetes mellitus with other skin complications: Secondary | ICD-10-CM | POA: Diagnosis not present

## 2019-06-06 DIAGNOSIS — L97412 Non-pressure chronic ulcer of right heel and midfoot with fat layer exposed: Secondary | ICD-10-CM | POA: Diagnosis not present

## 2019-06-06 DIAGNOSIS — J449 Chronic obstructive pulmonary disease, unspecified: Secondary | ICD-10-CM | POA: Diagnosis not present

## 2019-06-06 DIAGNOSIS — I10 Essential (primary) hypertension: Secondary | ICD-10-CM | POA: Diagnosis not present

## 2019-06-06 DIAGNOSIS — E11621 Type 2 diabetes mellitus with foot ulcer: Secondary | ICD-10-CM | POA: Diagnosis not present

## 2019-06-06 DIAGNOSIS — L89613 Pressure ulcer of right heel, stage 3: Secondary | ICD-10-CM | POA: Diagnosis not present

## 2019-06-06 NOTE — Patient Outreach (Signed)
[facility]) [place] Telephone Outreach- case closure PCP completes Transition of Care follow up post-hospital discharge Post-hospital discharge day # 1   06/06/2019  JADA FASS 1946-04-05 [id number]  Successful telephone outreach to [name], 74 y/o female with recent hospitalization at [facility] December 13, 2018 for AMS/ sepsis/ hypoxic respiratory failure related to pneumonia/ UTI; patient was discharged from hospital to SNF for rehabilitation and was apparently recently discharged from SNF back home to self carewith home health services in place. Patient has history including, but not limited to, COPD, on home O2; OSA with CPAP; DM; and morbid obesity.  Received notification from [facility] Liaison that patient experienced hospital re-admission at [facility] and was discharged home to self-care on February 1, 2021with ongoing home health services; unfortunately, received another message that patient was subsequently re-admitted to [facility] 13 days later, February 14-16, 2021.  While completing care coordination outreach with home health nurse yesterday, discovered that patient had experienced yet another [facility] admission June 01, 2019, this time 9 days after last discharge; patient was discharged home to self-care on June 05, 2019 with established home health services.  HIPAA/ identity verified with patient who again reports that she was hospitalized this time "because I couldn't breathe and they couldn't wake me up at home."  States that she believes that she is now "doing fine," and states she "doesn't know why" her breathing "is so hard" because her daughter checks her oxygen levels at home and "it is always fine."  Attempted to discuss with patient in layman's terms role of CO2 retention in setting of COPD/ CHF especially in concurrent use of continuous home oxygen; patient states "I think  they said something about that" while she was in hospital.  Patient further tells me that her only symptom prior to re-admitting is that "I get real sleepy for a couple of days;" I explained that this can be a sign of CO2 retention; patient unable to grasp information, however, states that her daughter/ caregiver is again unavailable for me to speak with.  I can hear caregiver in back ground providing answers for patient to share with me, however, caregiver will not talk to me herself.  I encouraged patient to share with her family members that should, in the future, they notice that she is becoming increasingly sleepy to promptly contact patient's care providers rather than wait until patient is confused unable to breathe effectively; patient states she will do.  Patient confirms that she continues wearing home O2 at 2-3 L/min.  Patient again adamantly refuses medication and blood sugar review today, stating that her caregiver/ daughter continues to manage her medications and blood sugars and "nothing has changed."  Patient admits that she in fact does not know what her medications and specific blood sugars are, because her daughter/ caregiver manages all aspects of her care; she confirms that she remains total care for caregiver.  Discussed role of [facility] RN CM and explained to patient that it is important for me to be able to review her specific medications/ blood sugars/ overall care at home; patient continues to decline review of all and has continually stated that her daughter/ caregiver is unavailable as well.  Explained to patient that for me to be of assistance to her, these aspects of her care at home are extremely important.  Patient reports that she has the home health nurse coming and she can not commit to completing medication  and blood sugar/ SaO2 review, now or ever.    Patient confirms that she has scheduled PCP office visit this Thursday 06/08/19; she also confirms that she attended wound clinic  visit this morning; stated was told "wound healing good."  Confirms that she continues eating regular diet, but acknowledges that the doctor at the hospital recommended that she be referred to dietician for diet management of DM; patient unsure if she will engage with dietician.   We discussed case closure, as patient and caregiver are unable/ unwilling to fully engage and are not committed to meeting patient's previously established [facility] RN CM goals; in fact, despite having had 3 hospital admissions in less than 28 calendar days, patient tells me today that she believes she "is doing really great at home" and "does not need to make any changes" to her overall plan of care at home.  Patient is agreeable with [facility] CM case closure, stating that she "thinks that is best."  Patientdeniesfurther issues, concerns, or problems today. Iconfirmed thatpatienthasmy direct phone number, the main [facility] CM office phone number, and the [facility] CM 24-hour nurse advice phone number should she change her mind and wish to engage with [facility] CM services in the future.  Best wishes were extended to patient today.  Plan:  Will close [place] case as patient continually declines engagement in program/ medication and blood sugar review/ and caregiver is not willing/ able to engage, and will make patient's PCP aware of same  Will request to present patient case to [facility] Multidisciplinary team for case review/ conference  [facility] CM Care Plan Problem One     Most Recent Value  Care Plan Problem One  Ongoing management of chronic foot wound, as evidenced by patient reporting  Role Documenting the Problem One  Care Management Coordinator  Care Plan for Problem One  Not Active  [facility] Long Term Goal   Over the next 60 days, patient will continue attending weekly wound clinic visits, as evidenced by patient reporting and collaboration with care providers as indicated during [facility] RN CM outreach  [facility] Long Term Goal Start Date   04/28/19  [facility] Long Term Goal Met Date  06/06/19 [facility] met]  Interventions for Problem One Long Term Goal  Confirmed with home health RN and with patient that wound is healing,  confirmed that patient continues attending wound center/ clinic visits and reviewed today's visit with patient  [facility] CM Short Term Goal #1   Over the next 30 days, patient will continue to participate in home health RN visits for wound management, as evidenced by patient reporting and collaboration with home health team as indicated during [facility] RN CM outreach  [facility] CM Short Term Goal #1 Start Date  05/25/19  [facility] CM Short Term Goal #1 Met Date  06/06/19 [Goal met]  Interventions for Short Term Goal #1  confirmed that home health services for wound care/ assessment continue and visit patient twice weekly    [facility] CM Care Plan Problem Two     Most Recent Value  Care Plan Problem Two  High risk for hospital re-admission related to/ as evidenced by recent unplanned admission for COPD exacerbation/ pneumonia secondary to ongoing anemia with UTI   Role Documenting the Problem Two  Care Management Coordinator  Care Plan for Problem Two  Not Active  Interventions for Problem Two Long Term Goal   Discussed with patient her most recent hospitalization and recent prior hospitalizations, all within 30 days of one another,  discussed with patient need to review post-hospital discharge medications/ blood sugars and patient/ caregiver are unwilling/ unable to review same with me, again.  Discussed and reiterated role of [facility] RN CM and attempted to provide education around ongoing management of COPD at home in setting of total care patient,  discussed with patient symptoms leading up to hospital re-admissions as sign of hypercarbia, not as sign of low oxygen,  encouraged patient to contact me in the future should she/ caregiver wish to participate/ fully engage with [facility] RN CM  [facility] Long Term Goal  Over the next 31 days, patient will not experience  unplanned hospital re-admission, as evidenced by patient reporting and review of EMR during [facility] RN CM outreach  [facility] Long Term Goal Start Date  05/25/19  [facility] Long Term Goal Met Date  06/06/19 [Goal not met]     Oneta Rack, RN, BSN, [employer] Coordinator [facility] Care Management  [phone]

## 2019-06-07 DIAGNOSIS — I4510 Unspecified right bundle-branch block: Secondary | ICD-10-CM | POA: Diagnosis not present

## 2019-06-07 DIAGNOSIS — M159 Polyosteoarthritis, unspecified: Secondary | ICD-10-CM | POA: Diagnosis not present

## 2019-06-07 DIAGNOSIS — R911 Solitary pulmonary nodule: Secondary | ICD-10-CM | POA: Diagnosis not present

## 2019-06-07 DIAGNOSIS — J9621 Acute and chronic respiratory failure with hypoxia: Secondary | ICD-10-CM | POA: Diagnosis not present

## 2019-06-07 DIAGNOSIS — E46 Unspecified protein-calorie malnutrition: Secondary | ICD-10-CM | POA: Diagnosis not present

## 2019-06-07 DIAGNOSIS — E11621 Type 2 diabetes mellitus with foot ulcer: Secondary | ICD-10-CM | POA: Diagnosis not present

## 2019-06-07 DIAGNOSIS — R339 Retention of urine, unspecified: Secondary | ICD-10-CM | POA: Diagnosis not present

## 2019-06-07 DIAGNOSIS — I700 Atherosclerosis of aorta: Secondary | ICD-10-CM | POA: Diagnosis not present

## 2019-06-07 DIAGNOSIS — I110 Hypertensive heart disease with heart failure: Secondary | ICD-10-CM | POA: Diagnosis not present

## 2019-06-07 DIAGNOSIS — K760 Fatty (change of) liver, not elsewhere classified: Secondary | ICD-10-CM | POA: Diagnosis not present

## 2019-06-07 DIAGNOSIS — L97419 Non-pressure chronic ulcer of right heel and midfoot with unspecified severity: Secondary | ICD-10-CM | POA: Diagnosis not present

## 2019-06-07 DIAGNOSIS — E1165 Type 2 diabetes mellitus with hyperglycemia: Secondary | ICD-10-CM | POA: Diagnosis not present

## 2019-06-07 DIAGNOSIS — G4733 Obstructive sleep apnea (adult) (pediatric): Secondary | ICD-10-CM | POA: Diagnosis not present

## 2019-06-07 DIAGNOSIS — I5033 Acute on chronic diastolic (congestive) heart failure: Secondary | ICD-10-CM | POA: Diagnosis not present

## 2019-06-07 DIAGNOSIS — E785 Hyperlipidemia, unspecified: Secondary | ICD-10-CM | POA: Diagnosis not present

## 2019-06-07 DIAGNOSIS — K5520 Angiodysplasia of colon without hemorrhage: Secondary | ICD-10-CM | POA: Diagnosis not present

## 2019-06-07 DIAGNOSIS — K219 Gastro-esophageal reflux disease without esophagitis: Secondary | ICD-10-CM | POA: Diagnosis not present

## 2019-06-07 DIAGNOSIS — J439 Emphysema, unspecified: Secondary | ICD-10-CM | POA: Diagnosis not present

## 2019-06-07 DIAGNOSIS — M8580 Other specified disorders of bone density and structure, unspecified site: Secondary | ICD-10-CM | POA: Diagnosis not present

## 2019-06-07 DIAGNOSIS — M109 Gout, unspecified: Secondary | ICD-10-CM | POA: Diagnosis not present

## 2019-06-07 DIAGNOSIS — J9622 Acute and chronic respiratory failure with hypercapnia: Secondary | ICD-10-CM | POA: Diagnosis not present

## 2019-06-07 DIAGNOSIS — E1151 Type 2 diabetes mellitus with diabetic peripheral angiopathy without gangrene: Secondary | ICD-10-CM | POA: Diagnosis not present

## 2019-06-07 DIAGNOSIS — D500 Iron deficiency anemia secondary to blood loss (chronic): Secondary | ICD-10-CM | POA: Diagnosis not present

## 2019-06-07 DIAGNOSIS — E1142 Type 2 diabetes mellitus with diabetic polyneuropathy: Secondary | ICD-10-CM | POA: Diagnosis not present

## 2019-06-08 DIAGNOSIS — I4510 Unspecified right bundle-branch block: Secondary | ICD-10-CM | POA: Diagnosis not present

## 2019-06-08 DIAGNOSIS — K219 Gastro-esophageal reflux disease without esophagitis: Secondary | ICD-10-CM | POA: Diagnosis not present

## 2019-06-08 DIAGNOSIS — K922 Gastrointestinal hemorrhage, unspecified: Secondary | ICD-10-CM | POA: Diagnosis not present

## 2019-06-08 DIAGNOSIS — J449 Chronic obstructive pulmonary disease, unspecified: Secondary | ICD-10-CM | POA: Diagnosis not present

## 2019-06-09 DIAGNOSIS — I5033 Acute on chronic diastolic (congestive) heart failure: Secondary | ICD-10-CM | POA: Diagnosis not present

## 2019-06-09 DIAGNOSIS — K219 Gastro-esophageal reflux disease without esophagitis: Secondary | ICD-10-CM | POA: Diagnosis not present

## 2019-06-09 DIAGNOSIS — E11621 Type 2 diabetes mellitus with foot ulcer: Secondary | ICD-10-CM | POA: Diagnosis not present

## 2019-06-09 DIAGNOSIS — R339 Retention of urine, unspecified: Secondary | ICD-10-CM | POA: Diagnosis not present

## 2019-06-09 DIAGNOSIS — D500 Iron deficiency anemia secondary to blood loss (chronic): Secondary | ICD-10-CM | POA: Diagnosis not present

## 2019-06-09 DIAGNOSIS — M8580 Other specified disorders of bone density and structure, unspecified site: Secondary | ICD-10-CM | POA: Diagnosis not present

## 2019-06-09 DIAGNOSIS — J439 Emphysema, unspecified: Secondary | ICD-10-CM | POA: Diagnosis not present

## 2019-06-09 DIAGNOSIS — E46 Unspecified protein-calorie malnutrition: Secondary | ICD-10-CM | POA: Diagnosis not present

## 2019-06-09 DIAGNOSIS — E1142 Type 2 diabetes mellitus with diabetic polyneuropathy: Secondary | ICD-10-CM | POA: Diagnosis not present

## 2019-06-09 DIAGNOSIS — K5520 Angiodysplasia of colon without hemorrhage: Secondary | ICD-10-CM | POA: Diagnosis not present

## 2019-06-09 DIAGNOSIS — E1151 Type 2 diabetes mellitus with diabetic peripheral angiopathy without gangrene: Secondary | ICD-10-CM | POA: Diagnosis not present

## 2019-06-09 DIAGNOSIS — K760 Fatty (change of) liver, not elsewhere classified: Secondary | ICD-10-CM | POA: Diagnosis not present

## 2019-06-09 DIAGNOSIS — I700 Atherosclerosis of aorta: Secondary | ICD-10-CM | POA: Diagnosis not present

## 2019-06-09 DIAGNOSIS — G4733 Obstructive sleep apnea (adult) (pediatric): Secondary | ICD-10-CM | POA: Diagnosis not present

## 2019-06-09 DIAGNOSIS — R911 Solitary pulmonary nodule: Secondary | ICD-10-CM | POA: Diagnosis not present

## 2019-06-09 DIAGNOSIS — I110 Hypertensive heart disease with heart failure: Secondary | ICD-10-CM | POA: Diagnosis not present

## 2019-06-09 DIAGNOSIS — E785 Hyperlipidemia, unspecified: Secondary | ICD-10-CM | POA: Diagnosis not present

## 2019-06-09 DIAGNOSIS — M159 Polyosteoarthritis, unspecified: Secondary | ICD-10-CM | POA: Diagnosis not present

## 2019-06-09 DIAGNOSIS — L97419 Non-pressure chronic ulcer of right heel and midfoot with unspecified severity: Secondary | ICD-10-CM | POA: Diagnosis not present

## 2019-06-09 DIAGNOSIS — J9622 Acute and chronic respiratory failure with hypercapnia: Secondary | ICD-10-CM | POA: Diagnosis not present

## 2019-06-09 DIAGNOSIS — J9621 Acute and chronic respiratory failure with hypoxia: Secondary | ICD-10-CM | POA: Diagnosis not present

## 2019-06-09 DIAGNOSIS — E1165 Type 2 diabetes mellitus with hyperglycemia: Secondary | ICD-10-CM | POA: Diagnosis not present

## 2019-06-09 DIAGNOSIS — I4510 Unspecified right bundle-branch block: Secondary | ICD-10-CM | POA: Diagnosis not present

## 2019-06-09 DIAGNOSIS — M109 Gout, unspecified: Secondary | ICD-10-CM | POA: Diagnosis not present

## 2019-06-12 DIAGNOSIS — E11621 Type 2 diabetes mellitus with foot ulcer: Secondary | ICD-10-CM | POA: Diagnosis not present

## 2019-06-12 DIAGNOSIS — I10 Essential (primary) hypertension: Secondary | ICD-10-CM | POA: Diagnosis not present

## 2019-06-12 DIAGNOSIS — L97412 Non-pressure chronic ulcer of right heel and midfoot with fat layer exposed: Secondary | ICD-10-CM | POA: Diagnosis not present

## 2019-06-12 DIAGNOSIS — E11628 Type 2 diabetes mellitus with other skin complications: Secondary | ICD-10-CM | POA: Diagnosis not present

## 2019-06-12 DIAGNOSIS — L89613 Pressure ulcer of right heel, stage 3: Secondary | ICD-10-CM | POA: Diagnosis not present

## 2019-06-13 DIAGNOSIS — E785 Hyperlipidemia, unspecified: Secondary | ICD-10-CM | POA: Diagnosis not present

## 2019-06-13 DIAGNOSIS — E11621 Type 2 diabetes mellitus with foot ulcer: Secondary | ICD-10-CM | POA: Diagnosis not present

## 2019-06-13 DIAGNOSIS — K760 Fatty (change of) liver, not elsewhere classified: Secondary | ICD-10-CM | POA: Diagnosis not present

## 2019-06-13 DIAGNOSIS — D500 Iron deficiency anemia secondary to blood loss (chronic): Secondary | ICD-10-CM | POA: Diagnosis not present

## 2019-06-13 DIAGNOSIS — I4510 Unspecified right bundle-branch block: Secondary | ICD-10-CM | POA: Diagnosis not present

## 2019-06-13 DIAGNOSIS — K5520 Angiodysplasia of colon without hemorrhage: Secondary | ICD-10-CM | POA: Diagnosis not present

## 2019-06-13 DIAGNOSIS — R911 Solitary pulmonary nodule: Secondary | ICD-10-CM | POA: Diagnosis not present

## 2019-06-13 DIAGNOSIS — I5033 Acute on chronic diastolic (congestive) heart failure: Secondary | ICD-10-CM | POA: Diagnosis not present

## 2019-06-13 DIAGNOSIS — E1151 Type 2 diabetes mellitus with diabetic peripheral angiopathy without gangrene: Secondary | ICD-10-CM | POA: Diagnosis not present

## 2019-06-13 DIAGNOSIS — E1165 Type 2 diabetes mellitus with hyperglycemia: Secondary | ICD-10-CM | POA: Diagnosis not present

## 2019-06-13 DIAGNOSIS — E46 Unspecified protein-calorie malnutrition: Secondary | ICD-10-CM | POA: Diagnosis not present

## 2019-06-13 DIAGNOSIS — J9621 Acute and chronic respiratory failure with hypoxia: Secondary | ICD-10-CM | POA: Diagnosis not present

## 2019-06-13 DIAGNOSIS — L97419 Non-pressure chronic ulcer of right heel and midfoot with unspecified severity: Secondary | ICD-10-CM | POA: Diagnosis not present

## 2019-06-13 DIAGNOSIS — I110 Hypertensive heart disease with heart failure: Secondary | ICD-10-CM | POA: Diagnosis not present

## 2019-06-13 DIAGNOSIS — I700 Atherosclerosis of aorta: Secondary | ICD-10-CM | POA: Diagnosis not present

## 2019-06-13 DIAGNOSIS — J439 Emphysema, unspecified: Secondary | ICD-10-CM | POA: Diagnosis not present

## 2019-06-13 DIAGNOSIS — J9622 Acute and chronic respiratory failure with hypercapnia: Secondary | ICD-10-CM | POA: Diagnosis not present

## 2019-06-13 DIAGNOSIS — M8580 Other specified disorders of bone density and structure, unspecified site: Secondary | ICD-10-CM | POA: Diagnosis not present

## 2019-06-13 DIAGNOSIS — K219 Gastro-esophageal reflux disease without esophagitis: Secondary | ICD-10-CM | POA: Diagnosis not present

## 2019-06-13 DIAGNOSIS — G4733 Obstructive sleep apnea (adult) (pediatric): Secondary | ICD-10-CM | POA: Diagnosis not present

## 2019-06-13 DIAGNOSIS — E1142 Type 2 diabetes mellitus with diabetic polyneuropathy: Secondary | ICD-10-CM | POA: Diagnosis not present

## 2019-06-13 DIAGNOSIS — R339 Retention of urine, unspecified: Secondary | ICD-10-CM | POA: Diagnosis not present

## 2019-06-13 DIAGNOSIS — M109 Gout, unspecified: Secondary | ICD-10-CM | POA: Diagnosis not present

## 2019-06-13 DIAGNOSIS — M159 Polyosteoarthritis, unspecified: Secondary | ICD-10-CM | POA: Diagnosis not present

## 2019-06-14 DIAGNOSIS — R339 Retention of urine, unspecified: Secondary | ICD-10-CM | POA: Diagnosis not present

## 2019-06-14 DIAGNOSIS — M109 Gout, unspecified: Secondary | ICD-10-CM | POA: Diagnosis not present

## 2019-06-14 DIAGNOSIS — K5520 Angiodysplasia of colon without hemorrhage: Secondary | ICD-10-CM | POA: Diagnosis not present

## 2019-06-14 DIAGNOSIS — E1151 Type 2 diabetes mellitus with diabetic peripheral angiopathy without gangrene: Secondary | ICD-10-CM | POA: Diagnosis not present

## 2019-06-14 DIAGNOSIS — J9622 Acute and chronic respiratory failure with hypercapnia: Secondary | ICD-10-CM | POA: Diagnosis not present

## 2019-06-14 DIAGNOSIS — I700 Atherosclerosis of aorta: Secondary | ICD-10-CM | POA: Diagnosis not present

## 2019-06-14 DIAGNOSIS — L97419 Non-pressure chronic ulcer of right heel and midfoot with unspecified severity: Secondary | ICD-10-CM | POA: Diagnosis not present

## 2019-06-14 DIAGNOSIS — J439 Emphysema, unspecified: Secondary | ICD-10-CM | POA: Diagnosis not present

## 2019-06-14 DIAGNOSIS — E1142 Type 2 diabetes mellitus with diabetic polyneuropathy: Secondary | ICD-10-CM | POA: Diagnosis not present

## 2019-06-14 DIAGNOSIS — I5033 Acute on chronic diastolic (congestive) heart failure: Secondary | ICD-10-CM | POA: Diagnosis not present

## 2019-06-14 DIAGNOSIS — I110 Hypertensive heart disease with heart failure: Secondary | ICD-10-CM | POA: Diagnosis not present

## 2019-06-14 DIAGNOSIS — K219 Gastro-esophageal reflux disease without esophagitis: Secondary | ICD-10-CM | POA: Diagnosis not present

## 2019-06-14 DIAGNOSIS — E785 Hyperlipidemia, unspecified: Secondary | ICD-10-CM | POA: Diagnosis not present

## 2019-06-14 DIAGNOSIS — M8580 Other specified disorders of bone density and structure, unspecified site: Secondary | ICD-10-CM | POA: Diagnosis not present

## 2019-06-14 DIAGNOSIS — E11621 Type 2 diabetes mellitus with foot ulcer: Secondary | ICD-10-CM | POA: Diagnosis not present

## 2019-06-14 DIAGNOSIS — N390 Urinary tract infection, site not specified: Secondary | ICD-10-CM | POA: Diagnosis not present

## 2019-06-14 DIAGNOSIS — D500 Iron deficiency anemia secondary to blood loss (chronic): Secondary | ICD-10-CM | POA: Diagnosis not present

## 2019-06-14 DIAGNOSIS — J9621 Acute and chronic respiratory failure with hypoxia: Secondary | ICD-10-CM | POA: Diagnosis not present

## 2019-06-14 DIAGNOSIS — G4733 Obstructive sleep apnea (adult) (pediatric): Secondary | ICD-10-CM | POA: Diagnosis not present

## 2019-06-14 DIAGNOSIS — I4510 Unspecified right bundle-branch block: Secondary | ICD-10-CM | POA: Diagnosis not present

## 2019-06-14 DIAGNOSIS — E1165 Type 2 diabetes mellitus with hyperglycemia: Secondary | ICD-10-CM | POA: Diagnosis not present

## 2019-06-14 DIAGNOSIS — E46 Unspecified protein-calorie malnutrition: Secondary | ICD-10-CM | POA: Diagnosis not present

## 2019-06-14 DIAGNOSIS — K760 Fatty (change of) liver, not elsewhere classified: Secondary | ICD-10-CM | POA: Diagnosis not present

## 2019-06-14 DIAGNOSIS — R911 Solitary pulmonary nodule: Secondary | ICD-10-CM | POA: Diagnosis not present

## 2019-06-14 DIAGNOSIS — Z87440 Personal history of urinary (tract) infections: Secondary | ICD-10-CM | POA: Diagnosis not present

## 2019-06-14 DIAGNOSIS — M159 Polyosteoarthritis, unspecified: Secondary | ICD-10-CM | POA: Diagnosis not present

## 2019-06-15 ENCOUNTER — Other Ambulatory Visit: Payer: Self-pay | Admitting: *Deleted

## 2019-06-15 NOTE — Patient Outreach (Signed)
[facility]) Care Management [facility] CM Multidisciplinary Case Conference  06/15/2019  Cassandra Barnes [birth year] [id number]  [facility] CM MultidisciplinaryCase discussion re:  Cassandra Barnes, 74 y/o female with recent hospitalization at [facility] December 13, 2018 for AMS/ sepsis/ hypoxic respiratory failure related to pneumonia/ UTI; patient was discharged from hospital to SNF for rehabilitation and was apparently recently discharged from SNF back home to self carewith home health services in place. Patient has history including, but not limited to, COPD, on home O2; OSA with CPAP; DM; and morbid obesity.  Received notification from [facility] Liaison that patient experienced hospital re-admission at [facility] and was discharged home to self-care on February 1, 2021with ongoing home health services; unfortunately, received another message that patient was subsequently re-admitted to [facility] 13 days later, February 14-16, 2021.  While completing care coordination outreach with home health nurse yesterday, discovered that patient had experienced yet another [facility] admission June 01, 2019, this time 9 days after last discharge; patient was discharged home to self-care on June 05, 2019 with established home health services.  [facility] CM case closed June 06, 2019 please see note from [place] telephone outreach dated 06/06/19  Barriers: -- total care patient who lives at home with daughter as caregiver; likely needs higher level of care, which patient and caregiver consistently refuse -- family dynamic issues: caregiver and patient bicker back and forth during outreach calls -- patient unable to verbalize/ remember details of inpatient hospitalizations/ established plan of care -- caregiver refusal to engage with [facility] CM for medication review/ needs at home/ blood sugars -- unable to review EMR for [facility] where patient is consistently  admitted; [facility] RN CM unaware of hospital readmissions until notified by patient reporting during regularly scheduled outreach-- care coordination effort made to discuss with [facility] RN CM that covers [facility] and has access to [place] Team Discussion: Patient initially did well after SNF visit discharge in November 2020; no hospital re-admissions noted until February 2021- patient has 3 in patient re-admissions, all less than 30 days of one another as noted above; patient reports all is "going great," and does not see concerns around multiple hospital readmissions.  Patient unable to recall plan of care/ details of inpatient hospital admissions.  Caregiver refuses to engage with [facility] CM on patient's behalf, therefore [place] case was closed accordingly  Plan:  Should patient re-present to [facility] CM as new referral, will re-assess patient/ caregiver desire to engage; will ensure that patient referral is sent to [place] that covers [place], [place], BSN, [place] Care Management  [phone]

## 2019-06-16 DIAGNOSIS — N390 Urinary tract infection, site not specified: Secondary | ICD-10-CM | POA: Diagnosis not present

## 2019-06-16 DIAGNOSIS — L97419 Non-pressure chronic ulcer of right heel and midfoot with unspecified severity: Secondary | ICD-10-CM | POA: Diagnosis not present

## 2019-06-16 DIAGNOSIS — R339 Retention of urine, unspecified: Secondary | ICD-10-CM | POA: Diagnosis not present

## 2019-06-16 DIAGNOSIS — E11621 Type 2 diabetes mellitus with foot ulcer: Secondary | ICD-10-CM | POA: Diagnosis not present

## 2019-06-16 DIAGNOSIS — E1151 Type 2 diabetes mellitus with diabetic peripheral angiopathy without gangrene: Secondary | ICD-10-CM | POA: Diagnosis not present

## 2019-06-16 DIAGNOSIS — R911 Solitary pulmonary nodule: Secondary | ICD-10-CM | POA: Diagnosis not present

## 2019-06-16 DIAGNOSIS — I110 Hypertensive heart disease with heart failure: Secondary | ICD-10-CM | POA: Diagnosis not present

## 2019-06-16 DIAGNOSIS — J9621 Acute and chronic respiratory failure with hypoxia: Secondary | ICD-10-CM | POA: Diagnosis not present

## 2019-06-16 DIAGNOSIS — I4510 Unspecified right bundle-branch block: Secondary | ICD-10-CM | POA: Diagnosis not present

## 2019-06-16 DIAGNOSIS — K219 Gastro-esophageal reflux disease without esophagitis: Secondary | ICD-10-CM | POA: Diagnosis not present

## 2019-06-16 DIAGNOSIS — I700 Atherosclerosis of aorta: Secondary | ICD-10-CM | POA: Diagnosis not present

## 2019-06-16 DIAGNOSIS — E46 Unspecified protein-calorie malnutrition: Secondary | ICD-10-CM | POA: Diagnosis not present

## 2019-06-16 DIAGNOSIS — M109 Gout, unspecified: Secondary | ICD-10-CM | POA: Diagnosis not present

## 2019-06-16 DIAGNOSIS — K922 Gastrointestinal hemorrhage, unspecified: Secondary | ICD-10-CM | POA: Diagnosis not present

## 2019-06-16 DIAGNOSIS — I5033 Acute on chronic diastolic (congestive) heart failure: Secondary | ICD-10-CM | POA: Diagnosis not present

## 2019-06-16 DIAGNOSIS — M8580 Other specified disorders of bone density and structure, unspecified site: Secondary | ICD-10-CM | POA: Diagnosis not present

## 2019-06-16 DIAGNOSIS — J439 Emphysema, unspecified: Secondary | ICD-10-CM | POA: Diagnosis not present

## 2019-06-16 DIAGNOSIS — J9622 Acute and chronic respiratory failure with hypercapnia: Secondary | ICD-10-CM | POA: Diagnosis not present

## 2019-06-16 DIAGNOSIS — E785 Hyperlipidemia, unspecified: Secondary | ICD-10-CM | POA: Diagnosis not present

## 2019-06-16 DIAGNOSIS — D500 Iron deficiency anemia secondary to blood loss (chronic): Secondary | ICD-10-CM | POA: Diagnosis not present

## 2019-06-16 DIAGNOSIS — K760 Fatty (change of) liver, not elsewhere classified: Secondary | ICD-10-CM | POA: Diagnosis not present

## 2019-06-16 DIAGNOSIS — M159 Polyosteoarthritis, unspecified: Secondary | ICD-10-CM | POA: Diagnosis not present

## 2019-06-16 DIAGNOSIS — E1142 Type 2 diabetes mellitus with diabetic polyneuropathy: Secondary | ICD-10-CM | POA: Diagnosis not present

## 2019-06-16 DIAGNOSIS — K5520 Angiodysplasia of colon without hemorrhage: Secondary | ICD-10-CM | POA: Diagnosis not present

## 2019-06-16 DIAGNOSIS — J449 Chronic obstructive pulmonary disease, unspecified: Secondary | ICD-10-CM | POA: Diagnosis not present

## 2019-06-16 DIAGNOSIS — E1165 Type 2 diabetes mellitus with hyperglycemia: Secondary | ICD-10-CM | POA: Diagnosis not present

## 2019-06-16 DIAGNOSIS — G4733 Obstructive sleep apnea (adult) (pediatric): Secondary | ICD-10-CM | POA: Diagnosis not present

## 2019-06-19 DIAGNOSIS — K760 Fatty (change of) liver, not elsewhere classified: Secondary | ICD-10-CM | POA: Diagnosis not present

## 2019-06-19 DIAGNOSIS — J9621 Acute and chronic respiratory failure with hypoxia: Secondary | ICD-10-CM | POA: Diagnosis not present

## 2019-06-19 DIAGNOSIS — K219 Gastro-esophageal reflux disease without esophagitis: Secondary | ICD-10-CM | POA: Diagnosis not present

## 2019-06-19 DIAGNOSIS — L89610 Pressure ulcer of right heel, unstageable: Secondary | ICD-10-CM | POA: Diagnosis not present

## 2019-06-19 DIAGNOSIS — M159 Polyosteoarthritis, unspecified: Secondary | ICD-10-CM | POA: Diagnosis not present

## 2019-06-19 DIAGNOSIS — J9622 Acute and chronic respiratory failure with hypercapnia: Secondary | ICD-10-CM | POA: Diagnosis not present

## 2019-06-19 DIAGNOSIS — J449 Chronic obstructive pulmonary disease, unspecified: Secondary | ICD-10-CM | POA: Diagnosis not present

## 2019-06-19 DIAGNOSIS — D500 Iron deficiency anemia secondary to blood loss (chronic): Secondary | ICD-10-CM | POA: Diagnosis not present

## 2019-06-19 DIAGNOSIS — E1151 Type 2 diabetes mellitus with diabetic peripheral angiopathy without gangrene: Secondary | ICD-10-CM | POA: Diagnosis not present

## 2019-06-19 DIAGNOSIS — L97419 Non-pressure chronic ulcer of right heel and midfoot with unspecified severity: Secondary | ICD-10-CM | POA: Diagnosis not present

## 2019-06-19 DIAGNOSIS — I4510 Unspecified right bundle-branch block: Secondary | ICD-10-CM | POA: Diagnosis not present

## 2019-06-19 DIAGNOSIS — R339 Retention of urine, unspecified: Secondary | ICD-10-CM | POA: Diagnosis not present

## 2019-06-19 DIAGNOSIS — K5520 Angiodysplasia of colon without hemorrhage: Secondary | ICD-10-CM | POA: Diagnosis not present

## 2019-06-19 DIAGNOSIS — E11621 Type 2 diabetes mellitus with foot ulcer: Secondary | ICD-10-CM | POA: Diagnosis not present

## 2019-06-19 DIAGNOSIS — E785 Hyperlipidemia, unspecified: Secondary | ICD-10-CM | POA: Diagnosis not present

## 2019-06-19 DIAGNOSIS — E1142 Type 2 diabetes mellitus with diabetic polyneuropathy: Secondary | ICD-10-CM | POA: Diagnosis not present

## 2019-06-19 DIAGNOSIS — I5032 Chronic diastolic (congestive) heart failure: Secondary | ICD-10-CM | POA: Diagnosis not present

## 2019-06-19 DIAGNOSIS — G4733 Obstructive sleep apnea (adult) (pediatric): Secondary | ICD-10-CM | POA: Diagnosis not present

## 2019-06-19 DIAGNOSIS — M109 Gout, unspecified: Secondary | ICD-10-CM | POA: Diagnosis not present

## 2019-06-19 DIAGNOSIS — E1165 Type 2 diabetes mellitus with hyperglycemia: Secondary | ICD-10-CM | POA: Diagnosis not present

## 2019-06-19 DIAGNOSIS — R911 Solitary pulmonary nodule: Secondary | ICD-10-CM | POA: Diagnosis not present

## 2019-06-19 DIAGNOSIS — I5033 Acute on chronic diastolic (congestive) heart failure: Secondary | ICD-10-CM | POA: Diagnosis not present

## 2019-06-19 DIAGNOSIS — M8580 Other specified disorders of bone density and structure, unspecified site: Secondary | ICD-10-CM | POA: Diagnosis not present

## 2019-06-19 DIAGNOSIS — J439 Emphysema, unspecified: Secondary | ICD-10-CM | POA: Diagnosis not present

## 2019-06-19 DIAGNOSIS — E46 Unspecified protein-calorie malnutrition: Secondary | ICD-10-CM | POA: Diagnosis not present

## 2019-06-19 DIAGNOSIS — I110 Hypertensive heart disease with heart failure: Secondary | ICD-10-CM | POA: Diagnosis not present

## 2019-06-19 DIAGNOSIS — I700 Atherosclerosis of aorta: Secondary | ICD-10-CM | POA: Diagnosis not present

## 2019-06-21 DIAGNOSIS — J9621 Acute and chronic respiratory failure with hypoxia: Secondary | ICD-10-CM | POA: Diagnosis not present

## 2019-06-21 DIAGNOSIS — I4510 Unspecified right bundle-branch block: Secondary | ICD-10-CM | POA: Diagnosis not present

## 2019-06-21 DIAGNOSIS — K5520 Angiodysplasia of colon without hemorrhage: Secondary | ICD-10-CM | POA: Diagnosis not present

## 2019-06-21 DIAGNOSIS — E11621 Type 2 diabetes mellitus with foot ulcer: Secondary | ICD-10-CM | POA: Diagnosis not present

## 2019-06-21 DIAGNOSIS — E1165 Type 2 diabetes mellitus with hyperglycemia: Secondary | ICD-10-CM | POA: Diagnosis not present

## 2019-06-21 DIAGNOSIS — I110 Hypertensive heart disease with heart failure: Secondary | ICD-10-CM | POA: Diagnosis not present

## 2019-06-21 DIAGNOSIS — J439 Emphysema, unspecified: Secondary | ICD-10-CM | POA: Diagnosis not present

## 2019-06-21 DIAGNOSIS — E1151 Type 2 diabetes mellitus with diabetic peripheral angiopathy without gangrene: Secondary | ICD-10-CM | POA: Diagnosis not present

## 2019-06-21 DIAGNOSIS — L97419 Non-pressure chronic ulcer of right heel and midfoot with unspecified severity: Secondary | ICD-10-CM | POA: Diagnosis not present

## 2019-06-21 DIAGNOSIS — D500 Iron deficiency anemia secondary to blood loss (chronic): Secondary | ICD-10-CM | POA: Diagnosis not present

## 2019-06-21 DIAGNOSIS — R911 Solitary pulmonary nodule: Secondary | ICD-10-CM | POA: Diagnosis not present

## 2019-06-21 DIAGNOSIS — M159 Polyosteoarthritis, unspecified: Secondary | ICD-10-CM | POA: Diagnosis not present

## 2019-06-21 DIAGNOSIS — I700 Atherosclerosis of aorta: Secondary | ICD-10-CM | POA: Diagnosis not present

## 2019-06-21 DIAGNOSIS — E1142 Type 2 diabetes mellitus with diabetic polyneuropathy: Secondary | ICD-10-CM | POA: Diagnosis not present

## 2019-06-21 DIAGNOSIS — G4733 Obstructive sleep apnea (adult) (pediatric): Secondary | ICD-10-CM | POA: Diagnosis not present

## 2019-06-21 DIAGNOSIS — R339 Retention of urine, unspecified: Secondary | ICD-10-CM | POA: Diagnosis not present

## 2019-06-21 DIAGNOSIS — M109 Gout, unspecified: Secondary | ICD-10-CM | POA: Diagnosis not present

## 2019-06-21 DIAGNOSIS — J9622 Acute and chronic respiratory failure with hypercapnia: Secondary | ICD-10-CM | POA: Diagnosis not present

## 2019-06-21 DIAGNOSIS — K219 Gastro-esophageal reflux disease without esophagitis: Secondary | ICD-10-CM | POA: Diagnosis not present

## 2019-06-21 DIAGNOSIS — K760 Fatty (change of) liver, not elsewhere classified: Secondary | ICD-10-CM | POA: Diagnosis not present

## 2019-06-21 DIAGNOSIS — I5033 Acute on chronic diastolic (congestive) heart failure: Secondary | ICD-10-CM | POA: Diagnosis not present

## 2019-06-21 DIAGNOSIS — E785 Hyperlipidemia, unspecified: Secondary | ICD-10-CM | POA: Diagnosis not present

## 2019-06-21 DIAGNOSIS — E46 Unspecified protein-calorie malnutrition: Secondary | ICD-10-CM | POA: Diagnosis not present

## 2019-06-21 DIAGNOSIS — M8580 Other specified disorders of bone density and structure, unspecified site: Secondary | ICD-10-CM | POA: Diagnosis not present

## 2019-06-23 DIAGNOSIS — J449 Chronic obstructive pulmonary disease, unspecified: Secondary | ICD-10-CM | POA: Diagnosis not present

## 2019-06-23 DIAGNOSIS — I110 Hypertensive heart disease with heart failure: Secondary | ICD-10-CM | POA: Diagnosis not present

## 2019-06-23 DIAGNOSIS — R339 Retention of urine, unspecified: Secondary | ICD-10-CM | POA: Diagnosis not present

## 2019-06-23 DIAGNOSIS — I10 Essential (primary) hypertension: Secondary | ICD-10-CM | POA: Diagnosis not present

## 2019-06-23 DIAGNOSIS — E1151 Type 2 diabetes mellitus with diabetic peripheral angiopathy without gangrene: Secondary | ICD-10-CM | POA: Diagnosis not present

## 2019-06-23 DIAGNOSIS — K219 Gastro-esophageal reflux disease without esophagitis: Secondary | ICD-10-CM | POA: Diagnosis not present

## 2019-06-23 DIAGNOSIS — B359 Dermatophytosis, unspecified: Secondary | ICD-10-CM | POA: Diagnosis not present

## 2019-06-23 DIAGNOSIS — R911 Solitary pulmonary nodule: Secondary | ICD-10-CM | POA: Diagnosis not present

## 2019-06-23 DIAGNOSIS — M109 Gout, unspecified: Secondary | ICD-10-CM | POA: Diagnosis not present

## 2019-06-23 DIAGNOSIS — J439 Emphysema, unspecified: Secondary | ICD-10-CM | POA: Diagnosis not present

## 2019-06-23 DIAGNOSIS — K922 Gastrointestinal hemorrhage, unspecified: Secondary | ICD-10-CM | POA: Diagnosis not present

## 2019-06-23 DIAGNOSIS — G4733 Obstructive sleep apnea (adult) (pediatric): Secondary | ICD-10-CM | POA: Diagnosis not present

## 2019-06-23 DIAGNOSIS — K5520 Angiodysplasia of colon without hemorrhage: Secondary | ICD-10-CM | POA: Diagnosis not present

## 2019-06-23 DIAGNOSIS — I4510 Unspecified right bundle-branch block: Secondary | ICD-10-CM | POA: Diagnosis not present

## 2019-06-23 DIAGNOSIS — E1142 Type 2 diabetes mellitus with diabetic polyneuropathy: Secondary | ICD-10-CM | POA: Diagnosis not present

## 2019-06-23 DIAGNOSIS — M8580 Other specified disorders of bone density and structure, unspecified site: Secondary | ICD-10-CM | POA: Diagnosis not present

## 2019-06-23 DIAGNOSIS — E46 Unspecified protein-calorie malnutrition: Secondary | ICD-10-CM | POA: Diagnosis not present

## 2019-06-23 DIAGNOSIS — M159 Polyosteoarthritis, unspecified: Secondary | ICD-10-CM | POA: Diagnosis not present

## 2019-06-23 DIAGNOSIS — E1165 Type 2 diabetes mellitus with hyperglycemia: Secondary | ICD-10-CM | POA: Diagnosis not present

## 2019-06-23 DIAGNOSIS — I5033 Acute on chronic diastolic (congestive) heart failure: Secondary | ICD-10-CM | POA: Diagnosis not present

## 2019-06-23 DIAGNOSIS — I700 Atherosclerosis of aorta: Secondary | ICD-10-CM | POA: Diagnosis not present

## 2019-06-23 DIAGNOSIS — J9621 Acute and chronic respiratory failure with hypoxia: Secondary | ICD-10-CM | POA: Diagnosis not present

## 2019-06-23 DIAGNOSIS — L97419 Non-pressure chronic ulcer of right heel and midfoot with unspecified severity: Secondary | ICD-10-CM | POA: Diagnosis not present

## 2019-06-23 DIAGNOSIS — D500 Iron deficiency anemia secondary to blood loss (chronic): Secondary | ICD-10-CM | POA: Diagnosis not present

## 2019-06-23 DIAGNOSIS — E785 Hyperlipidemia, unspecified: Secondary | ICD-10-CM | POA: Diagnosis not present

## 2019-06-23 DIAGNOSIS — J9622 Acute and chronic respiratory failure with hypercapnia: Secondary | ICD-10-CM | POA: Diagnosis not present

## 2019-06-23 DIAGNOSIS — J189 Pneumonia, unspecified organism: Secondary | ICD-10-CM | POA: Diagnosis not present

## 2019-06-23 DIAGNOSIS — E11621 Type 2 diabetes mellitus with foot ulcer: Secondary | ICD-10-CM | POA: Diagnosis not present

## 2019-06-23 DIAGNOSIS — K760 Fatty (change of) liver, not elsewhere classified: Secondary | ICD-10-CM | POA: Diagnosis not present

## 2019-06-25 DIAGNOSIS — D500 Iron deficiency anemia secondary to blood loss (chronic): Secondary | ICD-10-CM | POA: Diagnosis not present

## 2019-06-25 DIAGNOSIS — R0602 Shortness of breath: Secondary | ICD-10-CM | POA: Diagnosis not present

## 2019-06-25 DIAGNOSIS — J449 Chronic obstructive pulmonary disease, unspecified: Secondary | ICD-10-CM | POA: Diagnosis not present

## 2019-06-25 DIAGNOSIS — Z743 Need for continuous supervision: Secondary | ICD-10-CM | POA: Diagnosis not present

## 2019-06-25 DIAGNOSIS — J9621 Acute and chronic respiratory failure with hypoxia: Secondary | ICD-10-CM | POA: Diagnosis not present

## 2019-06-25 DIAGNOSIS — R0902 Hypoxemia: Secondary | ICD-10-CM | POA: Diagnosis not present

## 2019-06-25 DIAGNOSIS — R069 Unspecified abnormalities of breathing: Secondary | ICD-10-CM | POA: Diagnosis not present

## 2019-06-25 DIAGNOSIS — I4510 Unspecified right bundle-branch block: Secondary | ICD-10-CM | POA: Diagnosis not present

## 2019-06-26 DIAGNOSIS — D500 Iron deficiency anemia secondary to blood loss (chronic): Secondary | ICD-10-CM | POA: Diagnosis not present

## 2019-06-26 DIAGNOSIS — K922 Gastrointestinal hemorrhage, unspecified: Secondary | ICD-10-CM

## 2019-06-26 DIAGNOSIS — R195 Other fecal abnormalities: Secondary | ICD-10-CM | POA: Diagnosis not present

## 2019-06-26 DIAGNOSIS — E11621 Type 2 diabetes mellitus with foot ulcer: Secondary | ICD-10-CM | POA: Diagnosis not present

## 2019-06-26 DIAGNOSIS — J449 Chronic obstructive pulmonary disease, unspecified: Secondary | ICD-10-CM

## 2019-06-26 DIAGNOSIS — D62 Acute posthemorrhagic anemia: Secondary | ICD-10-CM | POA: Diagnosis not present

## 2019-06-26 DIAGNOSIS — M2550 Pain in unspecified joint: Secondary | ICD-10-CM | POA: Diagnosis not present

## 2019-06-26 DIAGNOSIS — R0600 Dyspnea, unspecified: Secondary | ICD-10-CM

## 2019-06-26 DIAGNOSIS — J9621 Acute and chronic respiratory failure with hypoxia: Secondary | ICD-10-CM | POA: Diagnosis not present

## 2019-06-26 DIAGNOSIS — I110 Hypertensive heart disease with heart failure: Secondary | ICD-10-CM | POA: Diagnosis not present

## 2019-06-26 DIAGNOSIS — D649 Anemia, unspecified: Secondary | ICD-10-CM

## 2019-06-26 DIAGNOSIS — Z743 Need for continuous supervision: Secondary | ICD-10-CM | POA: Diagnosis not present

## 2019-06-26 DIAGNOSIS — M1990 Unspecified osteoarthritis, unspecified site: Secondary | ICD-10-CM | POA: Diagnosis not present

## 2019-06-26 DIAGNOSIS — I252 Old myocardial infarction: Secondary | ICD-10-CM | POA: Diagnosis not present

## 2019-06-26 DIAGNOSIS — I5032 Chronic diastolic (congestive) heart failure: Secondary | ICD-10-CM | POA: Diagnosis not present

## 2019-06-26 DIAGNOSIS — Q2733 Arteriovenous malformation of digestive system vessel: Secondary | ICD-10-CM | POA: Diagnosis not present

## 2019-06-26 DIAGNOSIS — Z9981 Dependence on supplemental oxygen: Secondary | ICD-10-CM | POA: Diagnosis not present

## 2019-06-26 DIAGNOSIS — K31819 Angiodysplasia of stomach and duodenum without bleeding: Secondary | ICD-10-CM | POA: Diagnosis not present

## 2019-06-26 DIAGNOSIS — Z794 Long term (current) use of insulin: Secondary | ICD-10-CM | POA: Diagnosis not present

## 2019-06-26 DIAGNOSIS — E1151 Type 2 diabetes mellitus with diabetic peripheral angiopathy without gangrene: Secondary | ICD-10-CM | POA: Diagnosis not present

## 2019-06-26 DIAGNOSIS — K449 Diaphragmatic hernia without obstruction or gangrene: Secondary | ICD-10-CM | POA: Diagnosis not present

## 2019-06-26 DIAGNOSIS — L97419 Non-pressure chronic ulcer of right heel and midfoot with unspecified severity: Secondary | ICD-10-CM | POA: Diagnosis not present

## 2019-06-26 DIAGNOSIS — R0602 Shortness of breath: Secondary | ICD-10-CM | POA: Diagnosis not present

## 2019-06-26 DIAGNOSIS — M109 Gout, unspecified: Secondary | ICD-10-CM | POA: Diagnosis not present

## 2019-06-26 DIAGNOSIS — J9611 Chronic respiratory failure with hypoxia: Secondary | ICD-10-CM | POA: Diagnosis not present

## 2019-06-26 DIAGNOSIS — Z885 Allergy status to narcotic agent status: Secondary | ICD-10-CM | POA: Diagnosis not present

## 2019-06-26 DIAGNOSIS — K5520 Angiodysplasia of colon without hemorrhage: Secondary | ICD-10-CM | POA: Diagnosis not present

## 2019-06-26 DIAGNOSIS — Z7401 Bed confinement status: Secondary | ICD-10-CM | POA: Diagnosis not present

## 2019-06-26 DIAGNOSIS — Z792 Long term (current) use of antibiotics: Secondary | ICD-10-CM | POA: Diagnosis not present

## 2019-06-26 DIAGNOSIS — Z87891 Personal history of nicotine dependence: Secondary | ICD-10-CM | POA: Diagnosis not present

## 2019-06-26 DIAGNOSIS — K219 Gastro-esophageal reflux disease without esophagitis: Secondary | ICD-10-CM | POA: Diagnosis not present

## 2019-06-26 DIAGNOSIS — R58 Hemorrhage, not elsewhere classified: Secondary | ICD-10-CM | POA: Diagnosis not present

## 2019-06-26 DIAGNOSIS — Z8719 Personal history of other diseases of the digestive system: Secondary | ICD-10-CM | POA: Diagnosis not present

## 2019-06-26 DIAGNOSIS — E785 Hyperlipidemia, unspecified: Secondary | ICD-10-CM | POA: Diagnosis not present

## 2019-06-26 DIAGNOSIS — Z79899 Other long term (current) drug therapy: Secondary | ICD-10-CM | POA: Diagnosis not present

## 2019-06-26 DIAGNOSIS — E1169 Type 2 diabetes mellitus with other specified complication: Secondary | ICD-10-CM

## 2019-06-27 DIAGNOSIS — J449 Chronic obstructive pulmonary disease, unspecified: Secondary | ICD-10-CM | POA: Diagnosis not present

## 2019-06-27 DIAGNOSIS — R0600 Dyspnea, unspecified: Secondary | ICD-10-CM | POA: Diagnosis not present

## 2019-06-27 DIAGNOSIS — D649 Anemia, unspecified: Secondary | ICD-10-CM | POA: Diagnosis not present

## 2019-06-27 DIAGNOSIS — K922 Gastrointestinal hemorrhage, unspecified: Secondary | ICD-10-CM | POA: Diagnosis not present

## 2019-06-28 DIAGNOSIS — D649 Anemia, unspecified: Secondary | ICD-10-CM | POA: Diagnosis not present

## 2019-06-28 DIAGNOSIS — K922 Gastrointestinal hemorrhage, unspecified: Secondary | ICD-10-CM | POA: Diagnosis not present

## 2019-06-28 DIAGNOSIS — J449 Chronic obstructive pulmonary disease, unspecified: Secondary | ICD-10-CM | POA: Diagnosis not present

## 2019-06-28 DIAGNOSIS — R0600 Dyspnea, unspecified: Secondary | ICD-10-CM | POA: Diagnosis not present

## 2019-06-29 DIAGNOSIS — R0600 Dyspnea, unspecified: Secondary | ICD-10-CM | POA: Diagnosis not present

## 2019-06-29 DIAGNOSIS — K922 Gastrointestinal hemorrhage, unspecified: Secondary | ICD-10-CM | POA: Diagnosis not present

## 2019-06-29 DIAGNOSIS — D649 Anemia, unspecified: Secondary | ICD-10-CM | POA: Diagnosis not present

## 2019-06-29 DIAGNOSIS — J449 Chronic obstructive pulmonary disease, unspecified: Secondary | ICD-10-CM | POA: Diagnosis not present

## 2019-06-30 DIAGNOSIS — R0600 Dyspnea, unspecified: Secondary | ICD-10-CM | POA: Diagnosis not present

## 2019-06-30 DIAGNOSIS — J449 Chronic obstructive pulmonary disease, unspecified: Secondary | ICD-10-CM | POA: Diagnosis not present

## 2019-06-30 DIAGNOSIS — K922 Gastrointestinal hemorrhage, unspecified: Secondary | ICD-10-CM | POA: Diagnosis not present

## 2019-06-30 DIAGNOSIS — D649 Anemia, unspecified: Secondary | ICD-10-CM | POA: Diagnosis not present

## 2019-07-01 DIAGNOSIS — D649 Anemia, unspecified: Secondary | ICD-10-CM | POA: Diagnosis not present

## 2019-07-01 DIAGNOSIS — J449 Chronic obstructive pulmonary disease, unspecified: Secondary | ICD-10-CM | POA: Diagnosis not present

## 2019-07-01 DIAGNOSIS — R0600 Dyspnea, unspecified: Secondary | ICD-10-CM | POA: Diagnosis not present

## 2019-07-01 DIAGNOSIS — K922 Gastrointestinal hemorrhage, unspecified: Secondary | ICD-10-CM | POA: Diagnosis not present

## 2019-07-02 DIAGNOSIS — J449 Chronic obstructive pulmonary disease, unspecified: Secondary | ICD-10-CM | POA: Diagnosis not present

## 2019-07-02 DIAGNOSIS — I509 Heart failure, unspecified: Secondary | ICD-10-CM | POA: Diagnosis not present

## 2019-07-02 DIAGNOSIS — I739 Peripheral vascular disease, unspecified: Secondary | ICD-10-CM | POA: Diagnosis not present

## 2019-07-03 DIAGNOSIS — E1165 Type 2 diabetes mellitus with hyperglycemia: Secondary | ICD-10-CM | POA: Diagnosis not present

## 2019-07-03 DIAGNOSIS — J9621 Acute and chronic respiratory failure with hypoxia: Secondary | ICD-10-CM | POA: Diagnosis not present

## 2019-07-03 DIAGNOSIS — K760 Fatty (change of) liver, not elsewhere classified: Secondary | ICD-10-CM | POA: Diagnosis not present

## 2019-07-03 DIAGNOSIS — I700 Atherosclerosis of aorta: Secondary | ICD-10-CM | POA: Diagnosis not present

## 2019-07-03 DIAGNOSIS — E46 Unspecified protein-calorie malnutrition: Secondary | ICD-10-CM | POA: Diagnosis not present

## 2019-07-03 DIAGNOSIS — D500 Iron deficiency anemia secondary to blood loss (chronic): Secondary | ICD-10-CM | POA: Diagnosis not present

## 2019-07-03 DIAGNOSIS — R911 Solitary pulmonary nodule: Secondary | ICD-10-CM | POA: Diagnosis not present

## 2019-07-03 DIAGNOSIS — G4733 Obstructive sleep apnea (adult) (pediatric): Secondary | ICD-10-CM | POA: Diagnosis not present

## 2019-07-03 DIAGNOSIS — K219 Gastro-esophageal reflux disease without esophagitis: Secondary | ICD-10-CM | POA: Diagnosis not present

## 2019-07-03 DIAGNOSIS — I4510 Unspecified right bundle-branch block: Secondary | ICD-10-CM | POA: Diagnosis not present

## 2019-07-03 DIAGNOSIS — E11621 Type 2 diabetes mellitus with foot ulcer: Secondary | ICD-10-CM | POA: Diagnosis not present

## 2019-07-03 DIAGNOSIS — J439 Emphysema, unspecified: Secondary | ICD-10-CM | POA: Diagnosis not present

## 2019-07-03 DIAGNOSIS — L97419 Non-pressure chronic ulcer of right heel and midfoot with unspecified severity: Secondary | ICD-10-CM | POA: Diagnosis not present

## 2019-07-03 DIAGNOSIS — R339 Retention of urine, unspecified: Secondary | ICD-10-CM | POA: Diagnosis not present

## 2019-07-03 DIAGNOSIS — E1142 Type 2 diabetes mellitus with diabetic polyneuropathy: Secondary | ICD-10-CM | POA: Diagnosis not present

## 2019-07-03 DIAGNOSIS — J9622 Acute and chronic respiratory failure with hypercapnia: Secondary | ICD-10-CM | POA: Diagnosis not present

## 2019-07-03 DIAGNOSIS — I5033 Acute on chronic diastolic (congestive) heart failure: Secondary | ICD-10-CM | POA: Diagnosis not present

## 2019-07-03 DIAGNOSIS — M109 Gout, unspecified: Secondary | ICD-10-CM | POA: Diagnosis not present

## 2019-07-03 DIAGNOSIS — I110 Hypertensive heart disease with heart failure: Secondary | ICD-10-CM | POA: Diagnosis not present

## 2019-07-03 DIAGNOSIS — E785 Hyperlipidemia, unspecified: Secondary | ICD-10-CM | POA: Diagnosis not present

## 2019-07-03 DIAGNOSIS — M159 Polyosteoarthritis, unspecified: Secondary | ICD-10-CM | POA: Diagnosis not present

## 2019-07-03 DIAGNOSIS — E1151 Type 2 diabetes mellitus with diabetic peripheral angiopathy without gangrene: Secondary | ICD-10-CM | POA: Diagnosis not present

## 2019-07-03 DIAGNOSIS — M8580 Other specified disorders of bone density and structure, unspecified site: Secondary | ICD-10-CM | POA: Diagnosis not present

## 2019-07-03 DIAGNOSIS — K5520 Angiodysplasia of colon without hemorrhage: Secondary | ICD-10-CM | POA: Diagnosis not present

## 2019-07-06 DIAGNOSIS — I110 Hypertensive heart disease with heart failure: Secondary | ICD-10-CM | POA: Diagnosis not present

## 2019-07-06 DIAGNOSIS — I4510 Unspecified right bundle-branch block: Secondary | ICD-10-CM | POA: Diagnosis not present

## 2019-07-06 DIAGNOSIS — K760 Fatty (change of) liver, not elsewhere classified: Secondary | ICD-10-CM | POA: Diagnosis not present

## 2019-07-06 DIAGNOSIS — J9621 Acute and chronic respiratory failure with hypoxia: Secondary | ICD-10-CM | POA: Diagnosis not present

## 2019-07-06 DIAGNOSIS — K219 Gastro-esophageal reflux disease without esophagitis: Secondary | ICD-10-CM | POA: Diagnosis not present

## 2019-07-06 DIAGNOSIS — E11621 Type 2 diabetes mellitus with foot ulcer: Secondary | ICD-10-CM | POA: Diagnosis not present

## 2019-07-06 DIAGNOSIS — R339 Retention of urine, unspecified: Secondary | ICD-10-CM | POA: Diagnosis not present

## 2019-07-06 DIAGNOSIS — E1142 Type 2 diabetes mellitus with diabetic polyneuropathy: Secondary | ICD-10-CM | POA: Diagnosis not present

## 2019-07-06 DIAGNOSIS — I5033 Acute on chronic diastolic (congestive) heart failure: Secondary | ICD-10-CM | POA: Diagnosis not present

## 2019-07-06 DIAGNOSIS — J449 Chronic obstructive pulmonary disease, unspecified: Secondary | ICD-10-CM | POA: Diagnosis not present

## 2019-07-06 DIAGNOSIS — E1165 Type 2 diabetes mellitus with hyperglycemia: Secondary | ICD-10-CM | POA: Diagnosis not present

## 2019-07-06 DIAGNOSIS — J9622 Acute and chronic respiratory failure with hypercapnia: Secondary | ICD-10-CM | POA: Diagnosis not present

## 2019-07-06 DIAGNOSIS — R911 Solitary pulmonary nodule: Secondary | ICD-10-CM | POA: Diagnosis not present

## 2019-07-06 DIAGNOSIS — J439 Emphysema, unspecified: Secondary | ICD-10-CM | POA: Diagnosis not present

## 2019-07-06 DIAGNOSIS — E46 Unspecified protein-calorie malnutrition: Secondary | ICD-10-CM | POA: Diagnosis not present

## 2019-07-06 DIAGNOSIS — G4733 Obstructive sleep apnea (adult) (pediatric): Secondary | ICD-10-CM | POA: Diagnosis not present

## 2019-07-06 DIAGNOSIS — E1151 Type 2 diabetes mellitus with diabetic peripheral angiopathy without gangrene: Secondary | ICD-10-CM | POA: Diagnosis not present

## 2019-07-06 DIAGNOSIS — K5520 Angiodysplasia of colon without hemorrhage: Secondary | ICD-10-CM | POA: Diagnosis not present

## 2019-07-06 DIAGNOSIS — M8580 Other specified disorders of bone density and structure, unspecified site: Secondary | ICD-10-CM | POA: Diagnosis not present

## 2019-07-06 DIAGNOSIS — K922 Gastrointestinal hemorrhage, unspecified: Secondary | ICD-10-CM | POA: Diagnosis not present

## 2019-07-06 DIAGNOSIS — D500 Iron deficiency anemia secondary to blood loss (chronic): Secondary | ICD-10-CM | POA: Diagnosis not present

## 2019-07-06 DIAGNOSIS — E785 Hyperlipidemia, unspecified: Secondary | ICD-10-CM | POA: Diagnosis not present

## 2019-07-06 DIAGNOSIS — L97419 Non-pressure chronic ulcer of right heel and midfoot with unspecified severity: Secondary | ICD-10-CM | POA: Diagnosis not present

## 2019-07-06 DIAGNOSIS — M109 Gout, unspecified: Secondary | ICD-10-CM | POA: Diagnosis not present

## 2019-07-06 DIAGNOSIS — M159 Polyosteoarthritis, unspecified: Secondary | ICD-10-CM | POA: Diagnosis not present

## 2019-07-06 DIAGNOSIS — I700 Atherosclerosis of aorta: Secondary | ICD-10-CM | POA: Diagnosis not present

## 2019-07-07 DIAGNOSIS — J449 Chronic obstructive pulmonary disease, unspecified: Secondary | ICD-10-CM | POA: Diagnosis not present

## 2019-07-10 DIAGNOSIS — J439 Emphysema, unspecified: Secondary | ICD-10-CM | POA: Diagnosis not present

## 2019-07-10 DIAGNOSIS — E46 Unspecified protein-calorie malnutrition: Secondary | ICD-10-CM | POA: Diagnosis not present

## 2019-07-10 DIAGNOSIS — I110 Hypertensive heart disease with heart failure: Secondary | ICD-10-CM | POA: Diagnosis not present

## 2019-07-10 DIAGNOSIS — E1151 Type 2 diabetes mellitus with diabetic peripheral angiopathy without gangrene: Secondary | ICD-10-CM | POA: Diagnosis not present

## 2019-07-10 DIAGNOSIS — J9622 Acute and chronic respiratory failure with hypercapnia: Secondary | ICD-10-CM | POA: Diagnosis not present

## 2019-07-10 DIAGNOSIS — M159 Polyosteoarthritis, unspecified: Secondary | ICD-10-CM | POA: Diagnosis not present

## 2019-07-10 DIAGNOSIS — M8580 Other specified disorders of bone density and structure, unspecified site: Secondary | ICD-10-CM | POA: Diagnosis not present

## 2019-07-10 DIAGNOSIS — E1165 Type 2 diabetes mellitus with hyperglycemia: Secondary | ICD-10-CM | POA: Diagnosis not present

## 2019-07-10 DIAGNOSIS — E1142 Type 2 diabetes mellitus with diabetic polyneuropathy: Secondary | ICD-10-CM | POA: Diagnosis not present

## 2019-07-10 DIAGNOSIS — R339 Retention of urine, unspecified: Secondary | ICD-10-CM | POA: Diagnosis not present

## 2019-07-10 DIAGNOSIS — R911 Solitary pulmonary nodule: Secondary | ICD-10-CM | POA: Diagnosis not present

## 2019-07-10 DIAGNOSIS — I5033 Acute on chronic diastolic (congestive) heart failure: Secondary | ICD-10-CM | POA: Diagnosis not present

## 2019-07-10 DIAGNOSIS — I4510 Unspecified right bundle-branch block: Secondary | ICD-10-CM | POA: Diagnosis not present

## 2019-07-10 DIAGNOSIS — D500 Iron deficiency anemia secondary to blood loss (chronic): Secondary | ICD-10-CM | POA: Diagnosis not present

## 2019-07-10 DIAGNOSIS — J9621 Acute and chronic respiratory failure with hypoxia: Secondary | ICD-10-CM | POA: Diagnosis not present

## 2019-07-10 DIAGNOSIS — L97419 Non-pressure chronic ulcer of right heel and midfoot with unspecified severity: Secondary | ICD-10-CM | POA: Diagnosis not present

## 2019-07-10 DIAGNOSIS — G4733 Obstructive sleep apnea (adult) (pediatric): Secondary | ICD-10-CM | POA: Diagnosis not present

## 2019-07-10 DIAGNOSIS — K219 Gastro-esophageal reflux disease without esophagitis: Secondary | ICD-10-CM | POA: Diagnosis not present

## 2019-07-10 DIAGNOSIS — M109 Gout, unspecified: Secondary | ICD-10-CM | POA: Diagnosis not present

## 2019-07-10 DIAGNOSIS — K760 Fatty (change of) liver, not elsewhere classified: Secondary | ICD-10-CM | POA: Diagnosis not present

## 2019-07-10 DIAGNOSIS — E11621 Type 2 diabetes mellitus with foot ulcer: Secondary | ICD-10-CM | POA: Diagnosis not present

## 2019-07-10 DIAGNOSIS — I700 Atherosclerosis of aorta: Secondary | ICD-10-CM | POA: Diagnosis not present

## 2019-07-10 DIAGNOSIS — E785 Hyperlipidemia, unspecified: Secondary | ICD-10-CM | POA: Diagnosis not present

## 2019-07-10 DIAGNOSIS — K5520 Angiodysplasia of colon without hemorrhage: Secondary | ICD-10-CM | POA: Diagnosis not present

## 2019-07-12 DIAGNOSIS — G4733 Obstructive sleep apnea (adult) (pediatric): Secondary | ICD-10-CM | POA: Diagnosis not present

## 2019-07-12 DIAGNOSIS — M159 Polyosteoarthritis, unspecified: Secondary | ICD-10-CM | POA: Diagnosis not present

## 2019-07-12 DIAGNOSIS — I4510 Unspecified right bundle-branch block: Secondary | ICD-10-CM | POA: Diagnosis not present

## 2019-07-12 DIAGNOSIS — K219 Gastro-esophageal reflux disease without esophagitis: Secondary | ICD-10-CM | POA: Diagnosis not present

## 2019-07-12 DIAGNOSIS — J9622 Acute and chronic respiratory failure with hypercapnia: Secondary | ICD-10-CM | POA: Diagnosis not present

## 2019-07-12 DIAGNOSIS — E1142 Type 2 diabetes mellitus with diabetic polyneuropathy: Secondary | ICD-10-CM | POA: Diagnosis not present

## 2019-07-12 DIAGNOSIS — E11621 Type 2 diabetes mellitus with foot ulcer: Secondary | ICD-10-CM | POA: Diagnosis not present

## 2019-07-12 DIAGNOSIS — E785 Hyperlipidemia, unspecified: Secondary | ICD-10-CM | POA: Diagnosis not present

## 2019-07-12 DIAGNOSIS — E46 Unspecified protein-calorie malnutrition: Secondary | ICD-10-CM | POA: Diagnosis not present

## 2019-07-12 DIAGNOSIS — E1165 Type 2 diabetes mellitus with hyperglycemia: Secondary | ICD-10-CM | POA: Diagnosis not present

## 2019-07-12 DIAGNOSIS — R911 Solitary pulmonary nodule: Secondary | ICD-10-CM | POA: Diagnosis not present

## 2019-07-12 DIAGNOSIS — M109 Gout, unspecified: Secondary | ICD-10-CM | POA: Diagnosis not present

## 2019-07-12 DIAGNOSIS — I700 Atherosclerosis of aorta: Secondary | ICD-10-CM | POA: Diagnosis not present

## 2019-07-12 DIAGNOSIS — J439 Emphysema, unspecified: Secondary | ICD-10-CM | POA: Diagnosis not present

## 2019-07-12 DIAGNOSIS — I5033 Acute on chronic diastolic (congestive) heart failure: Secondary | ICD-10-CM | POA: Diagnosis not present

## 2019-07-12 DIAGNOSIS — L97419 Non-pressure chronic ulcer of right heel and midfoot with unspecified severity: Secondary | ICD-10-CM | POA: Diagnosis not present

## 2019-07-12 DIAGNOSIS — K760 Fatty (change of) liver, not elsewhere classified: Secondary | ICD-10-CM | POA: Diagnosis not present

## 2019-07-12 DIAGNOSIS — R339 Retention of urine, unspecified: Secondary | ICD-10-CM | POA: Diagnosis not present

## 2019-07-12 DIAGNOSIS — E1151 Type 2 diabetes mellitus with diabetic peripheral angiopathy without gangrene: Secondary | ICD-10-CM | POA: Diagnosis not present

## 2019-07-12 DIAGNOSIS — I110 Hypertensive heart disease with heart failure: Secondary | ICD-10-CM | POA: Diagnosis not present

## 2019-07-12 DIAGNOSIS — M8580 Other specified disorders of bone density and structure, unspecified site: Secondary | ICD-10-CM | POA: Diagnosis not present

## 2019-07-12 DIAGNOSIS — K5520 Angiodysplasia of colon without hemorrhage: Secondary | ICD-10-CM | POA: Diagnosis not present

## 2019-07-12 DIAGNOSIS — J9621 Acute and chronic respiratory failure with hypoxia: Secondary | ICD-10-CM | POA: Diagnosis not present

## 2019-07-12 DIAGNOSIS — D500 Iron deficiency anemia secondary to blood loss (chronic): Secondary | ICD-10-CM | POA: Diagnosis not present

## 2019-07-14 DIAGNOSIS — I700 Atherosclerosis of aorta: Secondary | ICD-10-CM | POA: Diagnosis not present

## 2019-07-14 DIAGNOSIS — E785 Hyperlipidemia, unspecified: Secondary | ICD-10-CM | POA: Diagnosis not present

## 2019-07-14 DIAGNOSIS — J9621 Acute and chronic respiratory failure with hypoxia: Secondary | ICD-10-CM | POA: Diagnosis not present

## 2019-07-14 DIAGNOSIS — I4510 Unspecified right bundle-branch block: Secondary | ICD-10-CM | POA: Diagnosis not present

## 2019-07-14 DIAGNOSIS — D500 Iron deficiency anemia secondary to blood loss (chronic): Secondary | ICD-10-CM | POA: Diagnosis not present

## 2019-07-14 DIAGNOSIS — M8580 Other specified disorders of bone density and structure, unspecified site: Secondary | ICD-10-CM | POA: Diagnosis not present

## 2019-07-14 DIAGNOSIS — K922 Gastrointestinal hemorrhage, unspecified: Secondary | ICD-10-CM | POA: Diagnosis not present

## 2019-07-14 DIAGNOSIS — R339 Retention of urine, unspecified: Secondary | ICD-10-CM | POA: Diagnosis not present

## 2019-07-14 DIAGNOSIS — E1142 Type 2 diabetes mellitus with diabetic polyneuropathy: Secondary | ICD-10-CM | POA: Diagnosis not present

## 2019-07-14 DIAGNOSIS — G4733 Obstructive sleep apnea (adult) (pediatric): Secondary | ICD-10-CM | POA: Diagnosis not present

## 2019-07-14 DIAGNOSIS — M109 Gout, unspecified: Secondary | ICD-10-CM | POA: Diagnosis not present

## 2019-07-14 DIAGNOSIS — E1165 Type 2 diabetes mellitus with hyperglycemia: Secondary | ICD-10-CM | POA: Diagnosis not present

## 2019-07-14 DIAGNOSIS — M159 Polyosteoarthritis, unspecified: Secondary | ICD-10-CM | POA: Diagnosis not present

## 2019-07-14 DIAGNOSIS — E11621 Type 2 diabetes mellitus with foot ulcer: Secondary | ICD-10-CM | POA: Diagnosis not present

## 2019-07-14 DIAGNOSIS — I5033 Acute on chronic diastolic (congestive) heart failure: Secondary | ICD-10-CM | POA: Diagnosis not present

## 2019-07-14 DIAGNOSIS — J449 Chronic obstructive pulmonary disease, unspecified: Secondary | ICD-10-CM | POA: Diagnosis not present

## 2019-07-14 DIAGNOSIS — L97419 Non-pressure chronic ulcer of right heel and midfoot with unspecified severity: Secondary | ICD-10-CM | POA: Diagnosis not present

## 2019-07-14 DIAGNOSIS — I110 Hypertensive heart disease with heart failure: Secondary | ICD-10-CM | POA: Diagnosis not present

## 2019-07-14 DIAGNOSIS — K219 Gastro-esophageal reflux disease without esophagitis: Secondary | ICD-10-CM | POA: Diagnosis not present

## 2019-07-14 DIAGNOSIS — J9622 Acute and chronic respiratory failure with hypercapnia: Secondary | ICD-10-CM | POA: Diagnosis not present

## 2019-07-14 DIAGNOSIS — K760 Fatty (change of) liver, not elsewhere classified: Secondary | ICD-10-CM | POA: Diagnosis not present

## 2019-07-14 DIAGNOSIS — E1151 Type 2 diabetes mellitus with diabetic peripheral angiopathy without gangrene: Secondary | ICD-10-CM | POA: Diagnosis not present

## 2019-07-14 DIAGNOSIS — J439 Emphysema, unspecified: Secondary | ICD-10-CM | POA: Diagnosis not present

## 2019-07-14 DIAGNOSIS — R911 Solitary pulmonary nodule: Secondary | ICD-10-CM | POA: Diagnosis not present

## 2019-07-14 DIAGNOSIS — E46 Unspecified protein-calorie malnutrition: Secondary | ICD-10-CM | POA: Diagnosis not present

## 2019-07-14 DIAGNOSIS — K5520 Angiodysplasia of colon without hemorrhage: Secondary | ICD-10-CM | POA: Diagnosis not present

## 2019-07-17 DIAGNOSIS — L97419 Non-pressure chronic ulcer of right heel and midfoot with unspecified severity: Secondary | ICD-10-CM | POA: Diagnosis not present

## 2019-07-17 DIAGNOSIS — L89613 Pressure ulcer of right heel, stage 3: Secondary | ICD-10-CM | POA: Diagnosis not present

## 2019-07-17 DIAGNOSIS — E11628 Type 2 diabetes mellitus with other skin complications: Secondary | ICD-10-CM | POA: Diagnosis not present

## 2019-07-17 DIAGNOSIS — L97422 Non-pressure chronic ulcer of left heel and midfoot with fat layer exposed: Secondary | ICD-10-CM | POA: Diagnosis not present

## 2019-07-17 DIAGNOSIS — E11621 Type 2 diabetes mellitus with foot ulcer: Secondary | ICD-10-CM | POA: Diagnosis not present

## 2019-07-17 DIAGNOSIS — I10 Essential (primary) hypertension: Secondary | ICD-10-CM | POA: Diagnosis not present

## 2019-07-18 DIAGNOSIS — E46 Unspecified protein-calorie malnutrition: Secondary | ICD-10-CM | POA: Diagnosis not present

## 2019-07-18 DIAGNOSIS — K760 Fatty (change of) liver, not elsewhere classified: Secondary | ICD-10-CM | POA: Diagnosis not present

## 2019-07-18 DIAGNOSIS — I4510 Unspecified right bundle-branch block: Secondary | ICD-10-CM | POA: Diagnosis not present

## 2019-07-18 DIAGNOSIS — D500 Iron deficiency anemia secondary to blood loss (chronic): Secondary | ICD-10-CM | POA: Diagnosis not present

## 2019-07-18 DIAGNOSIS — M8580 Other specified disorders of bone density and structure, unspecified site: Secondary | ICD-10-CM | POA: Diagnosis not present

## 2019-07-18 DIAGNOSIS — E1151 Type 2 diabetes mellitus with diabetic peripheral angiopathy without gangrene: Secondary | ICD-10-CM | POA: Diagnosis not present

## 2019-07-18 DIAGNOSIS — M159 Polyosteoarthritis, unspecified: Secondary | ICD-10-CM | POA: Diagnosis not present

## 2019-07-18 DIAGNOSIS — K219 Gastro-esophageal reflux disease without esophagitis: Secondary | ICD-10-CM | POA: Diagnosis not present

## 2019-07-18 DIAGNOSIS — J9622 Acute and chronic respiratory failure with hypercapnia: Secondary | ICD-10-CM | POA: Diagnosis not present

## 2019-07-18 DIAGNOSIS — K5520 Angiodysplasia of colon without hemorrhage: Secondary | ICD-10-CM | POA: Diagnosis not present

## 2019-07-18 DIAGNOSIS — R911 Solitary pulmonary nodule: Secondary | ICD-10-CM | POA: Diagnosis not present

## 2019-07-18 DIAGNOSIS — E11621 Type 2 diabetes mellitus with foot ulcer: Secondary | ICD-10-CM | POA: Diagnosis not present

## 2019-07-18 DIAGNOSIS — E785 Hyperlipidemia, unspecified: Secondary | ICD-10-CM | POA: Diagnosis not present

## 2019-07-18 DIAGNOSIS — I110 Hypertensive heart disease with heart failure: Secondary | ICD-10-CM | POA: Diagnosis not present

## 2019-07-18 DIAGNOSIS — J9621 Acute and chronic respiratory failure with hypoxia: Secondary | ICD-10-CM | POA: Diagnosis not present

## 2019-07-18 DIAGNOSIS — I700 Atherosclerosis of aorta: Secondary | ICD-10-CM | POA: Diagnosis not present

## 2019-07-18 DIAGNOSIS — R339 Retention of urine, unspecified: Secondary | ICD-10-CM | POA: Diagnosis not present

## 2019-07-18 DIAGNOSIS — L97419 Non-pressure chronic ulcer of right heel and midfoot with unspecified severity: Secondary | ICD-10-CM | POA: Diagnosis not present

## 2019-07-18 DIAGNOSIS — E1165 Type 2 diabetes mellitus with hyperglycemia: Secondary | ICD-10-CM | POA: Diagnosis not present

## 2019-07-18 DIAGNOSIS — G4733 Obstructive sleep apnea (adult) (pediatric): Secondary | ICD-10-CM | POA: Diagnosis not present

## 2019-07-18 DIAGNOSIS — M109 Gout, unspecified: Secondary | ICD-10-CM | POA: Diagnosis not present

## 2019-07-18 DIAGNOSIS — I5033 Acute on chronic diastolic (congestive) heart failure: Secondary | ICD-10-CM | POA: Diagnosis not present

## 2019-07-18 DIAGNOSIS — E1142 Type 2 diabetes mellitus with diabetic polyneuropathy: Secondary | ICD-10-CM | POA: Diagnosis not present

## 2019-07-18 DIAGNOSIS — J439 Emphysema, unspecified: Secondary | ICD-10-CM | POA: Diagnosis not present

## 2019-07-19 DIAGNOSIS — M8580 Other specified disorders of bone density and structure, unspecified site: Secondary | ICD-10-CM | POA: Diagnosis not present

## 2019-07-19 DIAGNOSIS — R911 Solitary pulmonary nodule: Secondary | ICD-10-CM | POA: Diagnosis not present

## 2019-07-19 DIAGNOSIS — K5520 Angiodysplasia of colon without hemorrhage: Secondary | ICD-10-CM | POA: Diagnosis not present

## 2019-07-19 DIAGNOSIS — E11621 Type 2 diabetes mellitus with foot ulcer: Secondary | ICD-10-CM | POA: Diagnosis not present

## 2019-07-19 DIAGNOSIS — J449 Chronic obstructive pulmonary disease, unspecified: Secondary | ICD-10-CM | POA: Diagnosis not present

## 2019-07-19 DIAGNOSIS — M109 Gout, unspecified: Secondary | ICD-10-CM | POA: Diagnosis not present

## 2019-07-19 DIAGNOSIS — E1142 Type 2 diabetes mellitus with diabetic polyneuropathy: Secondary | ICD-10-CM | POA: Diagnosis not present

## 2019-07-19 DIAGNOSIS — E46 Unspecified protein-calorie malnutrition: Secondary | ICD-10-CM | POA: Diagnosis not present

## 2019-07-19 DIAGNOSIS — I110 Hypertensive heart disease with heart failure: Secondary | ICD-10-CM | POA: Diagnosis not present

## 2019-07-19 DIAGNOSIS — K219 Gastro-esophageal reflux disease without esophagitis: Secondary | ICD-10-CM | POA: Diagnosis not present

## 2019-07-19 DIAGNOSIS — E785 Hyperlipidemia, unspecified: Secondary | ICD-10-CM | POA: Diagnosis not present

## 2019-07-19 DIAGNOSIS — L97419 Non-pressure chronic ulcer of right heel and midfoot with unspecified severity: Secondary | ICD-10-CM | POA: Diagnosis not present

## 2019-07-19 DIAGNOSIS — R339 Retention of urine, unspecified: Secondary | ICD-10-CM | POA: Diagnosis not present

## 2019-07-19 DIAGNOSIS — J9621 Acute and chronic respiratory failure with hypoxia: Secondary | ICD-10-CM | POA: Diagnosis not present

## 2019-07-19 DIAGNOSIS — I4510 Unspecified right bundle-branch block: Secondary | ICD-10-CM | POA: Diagnosis not present

## 2019-07-19 DIAGNOSIS — K760 Fatty (change of) liver, not elsewhere classified: Secondary | ICD-10-CM | POA: Diagnosis not present

## 2019-07-19 DIAGNOSIS — G4733 Obstructive sleep apnea (adult) (pediatric): Secondary | ICD-10-CM | POA: Diagnosis not present

## 2019-07-19 DIAGNOSIS — I700 Atherosclerosis of aorta: Secondary | ICD-10-CM | POA: Diagnosis not present

## 2019-07-19 DIAGNOSIS — K922 Gastrointestinal hemorrhage, unspecified: Secondary | ICD-10-CM | POA: Diagnosis not present

## 2019-07-19 DIAGNOSIS — E1151 Type 2 diabetes mellitus with diabetic peripheral angiopathy without gangrene: Secondary | ICD-10-CM | POA: Diagnosis not present

## 2019-07-19 DIAGNOSIS — J9622 Acute and chronic respiratory failure with hypercapnia: Secondary | ICD-10-CM | POA: Diagnosis not present

## 2019-07-19 DIAGNOSIS — I5033 Acute on chronic diastolic (congestive) heart failure: Secondary | ICD-10-CM | POA: Diagnosis not present

## 2019-07-19 DIAGNOSIS — J439 Emphysema, unspecified: Secondary | ICD-10-CM | POA: Diagnosis not present

## 2019-07-19 DIAGNOSIS — D500 Iron deficiency anemia secondary to blood loss (chronic): Secondary | ICD-10-CM | POA: Diagnosis not present

## 2019-07-19 DIAGNOSIS — M159 Polyosteoarthritis, unspecified: Secondary | ICD-10-CM | POA: Diagnosis not present

## 2019-07-19 DIAGNOSIS — E1165 Type 2 diabetes mellitus with hyperglycemia: Secondary | ICD-10-CM | POA: Diagnosis not present

## 2019-07-20 DIAGNOSIS — J449 Chronic obstructive pulmonary disease, unspecified: Secondary | ICD-10-CM | POA: Diagnosis not present

## 2019-07-20 DIAGNOSIS — I5032 Chronic diastolic (congestive) heart failure: Secondary | ICD-10-CM | POA: Diagnosis not present

## 2019-07-20 DIAGNOSIS — L89610 Pressure ulcer of right heel, unstageable: Secondary | ICD-10-CM | POA: Diagnosis not present

## 2019-07-20 DIAGNOSIS — I110 Hypertensive heart disease with heart failure: Secondary | ICD-10-CM | POA: Diagnosis not present

## 2019-07-21 DIAGNOSIS — D500 Iron deficiency anemia secondary to blood loss (chronic): Secondary | ICD-10-CM | POA: Diagnosis not present

## 2019-07-21 DIAGNOSIS — I5033 Acute on chronic diastolic (congestive) heart failure: Secondary | ICD-10-CM | POA: Diagnosis not present

## 2019-07-21 DIAGNOSIS — E1165 Type 2 diabetes mellitus with hyperglycemia: Secondary | ICD-10-CM | POA: Diagnosis not present

## 2019-07-21 DIAGNOSIS — I4510 Unspecified right bundle-branch block: Secondary | ICD-10-CM | POA: Diagnosis not present

## 2019-07-21 DIAGNOSIS — I700 Atherosclerosis of aorta: Secondary | ICD-10-CM | POA: Diagnosis not present

## 2019-07-21 DIAGNOSIS — J439 Emphysema, unspecified: Secondary | ICD-10-CM | POA: Diagnosis not present

## 2019-07-21 DIAGNOSIS — E11621 Type 2 diabetes mellitus with foot ulcer: Secondary | ICD-10-CM | POA: Diagnosis not present

## 2019-07-21 DIAGNOSIS — E1142 Type 2 diabetes mellitus with diabetic polyneuropathy: Secondary | ICD-10-CM | POA: Diagnosis not present

## 2019-07-21 DIAGNOSIS — J9621 Acute and chronic respiratory failure with hypoxia: Secondary | ICD-10-CM | POA: Diagnosis not present

## 2019-07-21 DIAGNOSIS — R339 Retention of urine, unspecified: Secondary | ICD-10-CM | POA: Diagnosis not present

## 2019-07-21 DIAGNOSIS — K760 Fatty (change of) liver, not elsewhere classified: Secondary | ICD-10-CM | POA: Diagnosis not present

## 2019-07-21 DIAGNOSIS — I110 Hypertensive heart disease with heart failure: Secondary | ICD-10-CM | POA: Diagnosis not present

## 2019-07-21 DIAGNOSIS — E785 Hyperlipidemia, unspecified: Secondary | ICD-10-CM | POA: Diagnosis not present

## 2019-07-21 DIAGNOSIS — K5520 Angiodysplasia of colon without hemorrhage: Secondary | ICD-10-CM | POA: Diagnosis not present

## 2019-07-21 DIAGNOSIS — J9622 Acute and chronic respiratory failure with hypercapnia: Secondary | ICD-10-CM | POA: Diagnosis not present

## 2019-07-21 DIAGNOSIS — R911 Solitary pulmonary nodule: Secondary | ICD-10-CM | POA: Diagnosis not present

## 2019-07-21 DIAGNOSIS — E46 Unspecified protein-calorie malnutrition: Secondary | ICD-10-CM | POA: Diagnosis not present

## 2019-07-21 DIAGNOSIS — E1151 Type 2 diabetes mellitus with diabetic peripheral angiopathy without gangrene: Secondary | ICD-10-CM | POA: Diagnosis not present

## 2019-07-21 DIAGNOSIS — K219 Gastro-esophageal reflux disease without esophagitis: Secondary | ICD-10-CM | POA: Diagnosis not present

## 2019-07-21 DIAGNOSIS — M159 Polyosteoarthritis, unspecified: Secondary | ICD-10-CM | POA: Diagnosis not present

## 2019-07-21 DIAGNOSIS — M109 Gout, unspecified: Secondary | ICD-10-CM | POA: Diagnosis not present

## 2019-07-21 DIAGNOSIS — M8580 Other specified disorders of bone density and structure, unspecified site: Secondary | ICD-10-CM | POA: Diagnosis not present

## 2019-07-21 DIAGNOSIS — G4733 Obstructive sleep apnea (adult) (pediatric): Secondary | ICD-10-CM | POA: Diagnosis not present

## 2019-07-21 DIAGNOSIS — L97419 Non-pressure chronic ulcer of right heel and midfoot with unspecified severity: Secondary | ICD-10-CM | POA: Diagnosis not present

## 2019-07-24 DIAGNOSIS — I4510 Unspecified right bundle-branch block: Secondary | ICD-10-CM | POA: Diagnosis not present

## 2019-07-24 DIAGNOSIS — I5033 Acute on chronic diastolic (congestive) heart failure: Secondary | ICD-10-CM | POA: Diagnosis not present

## 2019-07-24 DIAGNOSIS — R339 Retention of urine, unspecified: Secondary | ICD-10-CM | POA: Diagnosis not present

## 2019-07-24 DIAGNOSIS — G4733 Obstructive sleep apnea (adult) (pediatric): Secondary | ICD-10-CM | POA: Diagnosis not present

## 2019-07-24 DIAGNOSIS — R911 Solitary pulmonary nodule: Secondary | ICD-10-CM | POA: Diagnosis not present

## 2019-07-24 DIAGNOSIS — E46 Unspecified protein-calorie malnutrition: Secondary | ICD-10-CM | POA: Diagnosis not present

## 2019-07-24 DIAGNOSIS — K760 Fatty (change of) liver, not elsewhere classified: Secondary | ICD-10-CM | POA: Diagnosis not present

## 2019-07-24 DIAGNOSIS — D500 Iron deficiency anemia secondary to blood loss (chronic): Secondary | ICD-10-CM | POA: Diagnosis not present

## 2019-07-24 DIAGNOSIS — I110 Hypertensive heart disease with heart failure: Secondary | ICD-10-CM | POA: Diagnosis not present

## 2019-07-24 DIAGNOSIS — I700 Atherosclerosis of aorta: Secondary | ICD-10-CM | POA: Diagnosis not present

## 2019-07-24 DIAGNOSIS — M8580 Other specified disorders of bone density and structure, unspecified site: Secondary | ICD-10-CM | POA: Diagnosis not present

## 2019-07-24 DIAGNOSIS — K219 Gastro-esophageal reflux disease without esophagitis: Secondary | ICD-10-CM | POA: Diagnosis not present

## 2019-07-24 DIAGNOSIS — K5520 Angiodysplasia of colon without hemorrhage: Secondary | ICD-10-CM | POA: Diagnosis not present

## 2019-07-24 DIAGNOSIS — M159 Polyosteoarthritis, unspecified: Secondary | ICD-10-CM | POA: Diagnosis not present

## 2019-07-24 DIAGNOSIS — M109 Gout, unspecified: Secondary | ICD-10-CM | POA: Diagnosis not present

## 2019-07-24 DIAGNOSIS — E11621 Type 2 diabetes mellitus with foot ulcer: Secondary | ICD-10-CM | POA: Diagnosis not present

## 2019-07-24 DIAGNOSIS — E785 Hyperlipidemia, unspecified: Secondary | ICD-10-CM | POA: Diagnosis not present

## 2019-07-24 DIAGNOSIS — J9621 Acute and chronic respiratory failure with hypoxia: Secondary | ICD-10-CM | POA: Diagnosis not present

## 2019-07-24 DIAGNOSIS — E1151 Type 2 diabetes mellitus with diabetic peripheral angiopathy without gangrene: Secondary | ICD-10-CM | POA: Diagnosis not present

## 2019-07-24 DIAGNOSIS — J9622 Acute and chronic respiratory failure with hypercapnia: Secondary | ICD-10-CM | POA: Diagnosis not present

## 2019-07-24 DIAGNOSIS — E1142 Type 2 diabetes mellitus with diabetic polyneuropathy: Secondary | ICD-10-CM | POA: Diagnosis not present

## 2019-07-24 DIAGNOSIS — J439 Emphysema, unspecified: Secondary | ICD-10-CM | POA: Diagnosis not present

## 2019-07-24 DIAGNOSIS — L97419 Non-pressure chronic ulcer of right heel and midfoot with unspecified severity: Secondary | ICD-10-CM | POA: Diagnosis not present

## 2019-07-24 DIAGNOSIS — E1165 Type 2 diabetes mellitus with hyperglycemia: Secondary | ICD-10-CM | POA: Diagnosis not present

## 2019-07-25 DIAGNOSIS — K5520 Angiodysplasia of colon without hemorrhage: Secondary | ICD-10-CM | POA: Diagnosis not present

## 2019-07-25 DIAGNOSIS — E785 Hyperlipidemia, unspecified: Secondary | ICD-10-CM | POA: Diagnosis not present

## 2019-07-25 DIAGNOSIS — E46 Unspecified protein-calorie malnutrition: Secondary | ICD-10-CM | POA: Diagnosis not present

## 2019-07-25 DIAGNOSIS — J439 Emphysema, unspecified: Secondary | ICD-10-CM | POA: Diagnosis not present

## 2019-07-25 DIAGNOSIS — D500 Iron deficiency anemia secondary to blood loss (chronic): Secondary | ICD-10-CM | POA: Diagnosis not present

## 2019-07-25 DIAGNOSIS — I110 Hypertensive heart disease with heart failure: Secondary | ICD-10-CM | POA: Diagnosis not present

## 2019-07-25 DIAGNOSIS — G4733 Obstructive sleep apnea (adult) (pediatric): Secondary | ICD-10-CM | POA: Diagnosis not present

## 2019-07-25 DIAGNOSIS — E1142 Type 2 diabetes mellitus with diabetic polyneuropathy: Secondary | ICD-10-CM | POA: Diagnosis not present

## 2019-07-25 DIAGNOSIS — I4510 Unspecified right bundle-branch block: Secondary | ICD-10-CM | POA: Diagnosis not present

## 2019-07-25 DIAGNOSIS — K219 Gastro-esophageal reflux disease without esophagitis: Secondary | ICD-10-CM | POA: Diagnosis not present

## 2019-07-25 DIAGNOSIS — E1151 Type 2 diabetes mellitus with diabetic peripheral angiopathy without gangrene: Secondary | ICD-10-CM | POA: Diagnosis not present

## 2019-07-25 DIAGNOSIS — R339 Retention of urine, unspecified: Secondary | ICD-10-CM | POA: Diagnosis not present

## 2019-07-25 DIAGNOSIS — L97419 Non-pressure chronic ulcer of right heel and midfoot with unspecified severity: Secondary | ICD-10-CM | POA: Diagnosis not present

## 2019-07-25 DIAGNOSIS — M159 Polyosteoarthritis, unspecified: Secondary | ICD-10-CM | POA: Diagnosis not present

## 2019-07-25 DIAGNOSIS — E11621 Type 2 diabetes mellitus with foot ulcer: Secondary | ICD-10-CM | POA: Diagnosis not present

## 2019-07-25 DIAGNOSIS — M8580 Other specified disorders of bone density and structure, unspecified site: Secondary | ICD-10-CM | POA: Diagnosis not present

## 2019-07-25 DIAGNOSIS — J9621 Acute and chronic respiratory failure with hypoxia: Secondary | ICD-10-CM | POA: Diagnosis not present

## 2019-07-25 DIAGNOSIS — E1165 Type 2 diabetes mellitus with hyperglycemia: Secondary | ICD-10-CM | POA: Diagnosis not present

## 2019-07-25 DIAGNOSIS — K760 Fatty (change of) liver, not elsewhere classified: Secondary | ICD-10-CM | POA: Diagnosis not present

## 2019-07-25 DIAGNOSIS — I700 Atherosclerosis of aorta: Secondary | ICD-10-CM | POA: Diagnosis not present

## 2019-07-25 DIAGNOSIS — M109 Gout, unspecified: Secondary | ICD-10-CM | POA: Diagnosis not present

## 2019-07-25 DIAGNOSIS — R911 Solitary pulmonary nodule: Secondary | ICD-10-CM | POA: Diagnosis not present

## 2019-07-25 DIAGNOSIS — J9622 Acute and chronic respiratory failure with hypercapnia: Secondary | ICD-10-CM | POA: Diagnosis not present

## 2019-07-25 DIAGNOSIS — I5033 Acute on chronic diastolic (congestive) heart failure: Secondary | ICD-10-CM | POA: Diagnosis not present

## 2019-07-26 DIAGNOSIS — G4733 Obstructive sleep apnea (adult) (pediatric): Secondary | ICD-10-CM | POA: Diagnosis not present

## 2019-07-26 DIAGNOSIS — J449 Chronic obstructive pulmonary disease, unspecified: Secondary | ICD-10-CM | POA: Diagnosis not present

## 2019-07-26 DIAGNOSIS — J439 Emphysema, unspecified: Secondary | ICD-10-CM | POA: Diagnosis not present

## 2019-07-26 DIAGNOSIS — K219 Gastro-esophageal reflux disease without esophagitis: Secondary | ICD-10-CM | POA: Diagnosis not present

## 2019-07-26 DIAGNOSIS — L97419 Non-pressure chronic ulcer of right heel and midfoot with unspecified severity: Secondary | ICD-10-CM | POA: Diagnosis not present

## 2019-07-26 DIAGNOSIS — E1142 Type 2 diabetes mellitus with diabetic polyneuropathy: Secondary | ICD-10-CM | POA: Diagnosis not present

## 2019-07-26 DIAGNOSIS — I4510 Unspecified right bundle-branch block: Secondary | ICD-10-CM | POA: Diagnosis not present

## 2019-07-26 DIAGNOSIS — E785 Hyperlipidemia, unspecified: Secondary | ICD-10-CM | POA: Diagnosis not present

## 2019-07-26 DIAGNOSIS — K760 Fatty (change of) liver, not elsewhere classified: Secondary | ICD-10-CM | POA: Diagnosis not present

## 2019-07-26 DIAGNOSIS — I5033 Acute on chronic diastolic (congestive) heart failure: Secondary | ICD-10-CM | POA: Diagnosis not present

## 2019-07-26 DIAGNOSIS — J9622 Acute and chronic respiratory failure with hypercapnia: Secondary | ICD-10-CM | POA: Diagnosis not present

## 2019-07-26 DIAGNOSIS — E1165 Type 2 diabetes mellitus with hyperglycemia: Secondary | ICD-10-CM | POA: Diagnosis not present

## 2019-07-26 DIAGNOSIS — M109 Gout, unspecified: Secondary | ICD-10-CM | POA: Diagnosis not present

## 2019-07-26 DIAGNOSIS — J9621 Acute and chronic respiratory failure with hypoxia: Secondary | ICD-10-CM | POA: Diagnosis not present

## 2019-07-26 DIAGNOSIS — I110 Hypertensive heart disease with heart failure: Secondary | ICD-10-CM | POA: Diagnosis not present

## 2019-07-26 DIAGNOSIS — R911 Solitary pulmonary nodule: Secondary | ICD-10-CM | POA: Diagnosis not present

## 2019-07-26 DIAGNOSIS — E1151 Type 2 diabetes mellitus with diabetic peripheral angiopathy without gangrene: Secondary | ICD-10-CM | POA: Diagnosis not present

## 2019-07-26 DIAGNOSIS — E46 Unspecified protein-calorie malnutrition: Secondary | ICD-10-CM | POA: Diagnosis not present

## 2019-07-26 DIAGNOSIS — R339 Retention of urine, unspecified: Secondary | ICD-10-CM | POA: Diagnosis not present

## 2019-07-26 DIAGNOSIS — I700 Atherosclerosis of aorta: Secondary | ICD-10-CM | POA: Diagnosis not present

## 2019-07-26 DIAGNOSIS — E11621 Type 2 diabetes mellitus with foot ulcer: Secondary | ICD-10-CM | POA: Diagnosis not present

## 2019-07-26 DIAGNOSIS — K5520 Angiodysplasia of colon without hemorrhage: Secondary | ICD-10-CM | POA: Diagnosis not present

## 2019-07-26 DIAGNOSIS — D500 Iron deficiency anemia secondary to blood loss (chronic): Secondary | ICD-10-CM | POA: Diagnosis not present

## 2019-07-26 DIAGNOSIS — M8580 Other specified disorders of bone density and structure, unspecified site: Secondary | ICD-10-CM | POA: Diagnosis not present

## 2019-07-26 DIAGNOSIS — M159 Polyosteoarthritis, unspecified: Secondary | ICD-10-CM | POA: Diagnosis not present

## 2019-07-27 DIAGNOSIS — I700 Atherosclerosis of aorta: Secondary | ICD-10-CM | POA: Diagnosis not present

## 2019-07-27 DIAGNOSIS — E1151 Type 2 diabetes mellitus with diabetic peripheral angiopathy without gangrene: Secondary | ICD-10-CM | POA: Diagnosis not present

## 2019-07-27 DIAGNOSIS — L97419 Non-pressure chronic ulcer of right heel and midfoot with unspecified severity: Secondary | ICD-10-CM | POA: Diagnosis not present

## 2019-07-27 DIAGNOSIS — J9622 Acute and chronic respiratory failure with hypercapnia: Secondary | ICD-10-CM | POA: Diagnosis not present

## 2019-07-27 DIAGNOSIS — E785 Hyperlipidemia, unspecified: Secondary | ICD-10-CM | POA: Diagnosis not present

## 2019-07-27 DIAGNOSIS — J439 Emphysema, unspecified: Secondary | ICD-10-CM | POA: Diagnosis not present

## 2019-07-27 DIAGNOSIS — E1165 Type 2 diabetes mellitus with hyperglycemia: Secondary | ICD-10-CM | POA: Diagnosis not present

## 2019-07-27 DIAGNOSIS — I4510 Unspecified right bundle-branch block: Secondary | ICD-10-CM | POA: Diagnosis not present

## 2019-07-27 DIAGNOSIS — G4733 Obstructive sleep apnea (adult) (pediatric): Secondary | ICD-10-CM | POA: Diagnosis not present

## 2019-07-27 DIAGNOSIS — E11621 Type 2 diabetes mellitus with foot ulcer: Secondary | ICD-10-CM | POA: Diagnosis not present

## 2019-07-27 DIAGNOSIS — K760 Fatty (change of) liver, not elsewhere classified: Secondary | ICD-10-CM | POA: Diagnosis not present

## 2019-07-27 DIAGNOSIS — K219 Gastro-esophageal reflux disease without esophagitis: Secondary | ICD-10-CM | POA: Diagnosis not present

## 2019-07-27 DIAGNOSIS — R339 Retention of urine, unspecified: Secondary | ICD-10-CM | POA: Diagnosis not present

## 2019-07-27 DIAGNOSIS — M8580 Other specified disorders of bone density and structure, unspecified site: Secondary | ICD-10-CM | POA: Diagnosis not present

## 2019-07-27 DIAGNOSIS — I5033 Acute on chronic diastolic (congestive) heart failure: Secondary | ICD-10-CM | POA: Diagnosis not present

## 2019-07-27 DIAGNOSIS — E46 Unspecified protein-calorie malnutrition: Secondary | ICD-10-CM | POA: Diagnosis not present

## 2019-07-27 DIAGNOSIS — D500 Iron deficiency anemia secondary to blood loss (chronic): Secondary | ICD-10-CM | POA: Diagnosis not present

## 2019-07-27 DIAGNOSIS — E1142 Type 2 diabetes mellitus with diabetic polyneuropathy: Secondary | ICD-10-CM | POA: Diagnosis not present

## 2019-07-27 DIAGNOSIS — K5520 Angiodysplasia of colon without hemorrhage: Secondary | ICD-10-CM | POA: Diagnosis not present

## 2019-07-27 DIAGNOSIS — R911 Solitary pulmonary nodule: Secondary | ICD-10-CM | POA: Diagnosis not present

## 2019-07-27 DIAGNOSIS — J9621 Acute and chronic respiratory failure with hypoxia: Secondary | ICD-10-CM | POA: Diagnosis not present

## 2019-07-27 DIAGNOSIS — M109 Gout, unspecified: Secondary | ICD-10-CM | POA: Diagnosis not present

## 2019-07-27 DIAGNOSIS — M159 Polyosteoarthritis, unspecified: Secondary | ICD-10-CM | POA: Diagnosis not present

## 2019-07-27 DIAGNOSIS — I110 Hypertensive heart disease with heart failure: Secondary | ICD-10-CM | POA: Diagnosis not present

## 2019-07-28 DIAGNOSIS — I110 Hypertensive heart disease with heart failure: Secondary | ICD-10-CM | POA: Diagnosis not present

## 2019-07-28 DIAGNOSIS — E785 Hyperlipidemia, unspecified: Secondary | ICD-10-CM | POA: Diagnosis not present

## 2019-07-28 DIAGNOSIS — E1142 Type 2 diabetes mellitus with diabetic polyneuropathy: Secondary | ICD-10-CM | POA: Diagnosis not present

## 2019-07-28 DIAGNOSIS — K219 Gastro-esophageal reflux disease without esophagitis: Secondary | ICD-10-CM | POA: Diagnosis not present

## 2019-07-28 DIAGNOSIS — L97419 Non-pressure chronic ulcer of right heel and midfoot with unspecified severity: Secondary | ICD-10-CM | POA: Diagnosis not present

## 2019-07-28 DIAGNOSIS — J439 Emphysema, unspecified: Secondary | ICD-10-CM | POA: Diagnosis not present

## 2019-07-28 DIAGNOSIS — I700 Atherosclerosis of aorta: Secondary | ICD-10-CM | POA: Diagnosis not present

## 2019-07-28 DIAGNOSIS — E1151 Type 2 diabetes mellitus with diabetic peripheral angiopathy without gangrene: Secondary | ICD-10-CM | POA: Diagnosis not present

## 2019-07-28 DIAGNOSIS — J9622 Acute and chronic respiratory failure with hypercapnia: Secondary | ICD-10-CM | POA: Diagnosis not present

## 2019-07-28 DIAGNOSIS — K5520 Angiodysplasia of colon without hemorrhage: Secondary | ICD-10-CM | POA: Diagnosis not present

## 2019-07-28 DIAGNOSIS — I4510 Unspecified right bundle-branch block: Secondary | ICD-10-CM | POA: Diagnosis not present

## 2019-07-28 DIAGNOSIS — I5033 Acute on chronic diastolic (congestive) heart failure: Secondary | ICD-10-CM | POA: Diagnosis not present

## 2019-07-28 DIAGNOSIS — G4733 Obstructive sleep apnea (adult) (pediatric): Secondary | ICD-10-CM | POA: Diagnosis not present

## 2019-07-28 DIAGNOSIS — E1165 Type 2 diabetes mellitus with hyperglycemia: Secondary | ICD-10-CM | POA: Diagnosis not present

## 2019-07-28 DIAGNOSIS — R339 Retention of urine, unspecified: Secondary | ICD-10-CM | POA: Diagnosis not present

## 2019-07-28 DIAGNOSIS — M8580 Other specified disorders of bone density and structure, unspecified site: Secondary | ICD-10-CM | POA: Diagnosis not present

## 2019-07-28 DIAGNOSIS — D500 Iron deficiency anemia secondary to blood loss (chronic): Secondary | ICD-10-CM | POA: Diagnosis not present

## 2019-07-28 DIAGNOSIS — K760 Fatty (change of) liver, not elsewhere classified: Secondary | ICD-10-CM | POA: Diagnosis not present

## 2019-07-28 DIAGNOSIS — M109 Gout, unspecified: Secondary | ICD-10-CM | POA: Diagnosis not present

## 2019-07-28 DIAGNOSIS — E11621 Type 2 diabetes mellitus with foot ulcer: Secondary | ICD-10-CM | POA: Diagnosis not present

## 2019-07-28 DIAGNOSIS — E46 Unspecified protein-calorie malnutrition: Secondary | ICD-10-CM | POA: Diagnosis not present

## 2019-07-28 DIAGNOSIS — R911 Solitary pulmonary nodule: Secondary | ICD-10-CM | POA: Diagnosis not present

## 2019-07-28 DIAGNOSIS — J9621 Acute and chronic respiratory failure with hypoxia: Secondary | ICD-10-CM | POA: Diagnosis not present

## 2019-07-28 DIAGNOSIS — M159 Polyosteoarthritis, unspecified: Secondary | ICD-10-CM | POA: Diagnosis not present

## 2019-07-31 DIAGNOSIS — L89622 Pressure ulcer of left heel, stage 2: Secondary | ICD-10-CM | POA: Diagnosis not present

## 2019-07-31 DIAGNOSIS — L97422 Non-pressure chronic ulcer of left heel and midfoot with fat layer exposed: Secondary | ICD-10-CM | POA: Diagnosis not present

## 2019-07-31 DIAGNOSIS — I10 Essential (primary) hypertension: Secondary | ICD-10-CM | POA: Diagnosis not present

## 2019-07-31 DIAGNOSIS — L89612 Pressure ulcer of right heel, stage 2: Secondary | ICD-10-CM | POA: Diagnosis not present

## 2019-07-31 DIAGNOSIS — E11621 Type 2 diabetes mellitus with foot ulcer: Secondary | ICD-10-CM | POA: Diagnosis not present

## 2019-07-31 DIAGNOSIS — L97412 Non-pressure chronic ulcer of right heel and midfoot with fat layer exposed: Secondary | ICD-10-CM | POA: Diagnosis not present

## 2019-08-01 DIAGNOSIS — R339 Retention of urine, unspecified: Secondary | ICD-10-CM | POA: Diagnosis not present

## 2019-08-01 DIAGNOSIS — K5520 Angiodysplasia of colon without hemorrhage: Secondary | ICD-10-CM | POA: Diagnosis not present

## 2019-08-01 DIAGNOSIS — K219 Gastro-esophageal reflux disease without esophagitis: Secondary | ICD-10-CM | POA: Diagnosis not present

## 2019-08-01 DIAGNOSIS — J9622 Acute and chronic respiratory failure with hypercapnia: Secondary | ICD-10-CM | POA: Diagnosis not present

## 2019-08-01 DIAGNOSIS — I110 Hypertensive heart disease with heart failure: Secondary | ICD-10-CM | POA: Diagnosis not present

## 2019-08-01 DIAGNOSIS — I700 Atherosclerosis of aorta: Secondary | ICD-10-CM | POA: Diagnosis not present

## 2019-08-01 DIAGNOSIS — E11621 Type 2 diabetes mellitus with foot ulcer: Secondary | ICD-10-CM | POA: Diagnosis not present

## 2019-08-01 DIAGNOSIS — M8580 Other specified disorders of bone density and structure, unspecified site: Secondary | ICD-10-CM | POA: Diagnosis not present

## 2019-08-01 DIAGNOSIS — J439 Emphysema, unspecified: Secondary | ICD-10-CM | POA: Diagnosis not present

## 2019-08-01 DIAGNOSIS — E785 Hyperlipidemia, unspecified: Secondary | ICD-10-CM | POA: Diagnosis not present

## 2019-08-01 DIAGNOSIS — I4510 Unspecified right bundle-branch block: Secondary | ICD-10-CM | POA: Diagnosis not present

## 2019-08-01 DIAGNOSIS — E46 Unspecified protein-calorie malnutrition: Secondary | ICD-10-CM | POA: Diagnosis not present

## 2019-08-01 DIAGNOSIS — D500 Iron deficiency anemia secondary to blood loss (chronic): Secondary | ICD-10-CM | POA: Diagnosis not present

## 2019-08-01 DIAGNOSIS — J9621 Acute and chronic respiratory failure with hypoxia: Secondary | ICD-10-CM | POA: Diagnosis not present

## 2019-08-01 DIAGNOSIS — M109 Gout, unspecified: Secondary | ICD-10-CM | POA: Diagnosis not present

## 2019-08-01 DIAGNOSIS — I5033 Acute on chronic diastolic (congestive) heart failure: Secondary | ICD-10-CM | POA: Diagnosis not present

## 2019-08-01 DIAGNOSIS — R911 Solitary pulmonary nodule: Secondary | ICD-10-CM | POA: Diagnosis not present

## 2019-08-01 DIAGNOSIS — K760 Fatty (change of) liver, not elsewhere classified: Secondary | ICD-10-CM | POA: Diagnosis not present

## 2019-08-01 DIAGNOSIS — E1142 Type 2 diabetes mellitus with diabetic polyneuropathy: Secondary | ICD-10-CM | POA: Diagnosis not present

## 2019-08-01 DIAGNOSIS — E1165 Type 2 diabetes mellitus with hyperglycemia: Secondary | ICD-10-CM | POA: Diagnosis not present

## 2019-08-01 DIAGNOSIS — L97419 Non-pressure chronic ulcer of right heel and midfoot with unspecified severity: Secondary | ICD-10-CM | POA: Diagnosis not present

## 2019-08-01 DIAGNOSIS — M159 Polyosteoarthritis, unspecified: Secondary | ICD-10-CM | POA: Diagnosis not present

## 2019-08-01 DIAGNOSIS — G4733 Obstructive sleep apnea (adult) (pediatric): Secondary | ICD-10-CM | POA: Diagnosis not present

## 2019-08-01 DIAGNOSIS — E1151 Type 2 diabetes mellitus with diabetic peripheral angiopathy without gangrene: Secondary | ICD-10-CM | POA: Diagnosis not present

## 2019-08-02 DIAGNOSIS — E11621 Type 2 diabetes mellitus with foot ulcer: Secondary | ICD-10-CM | POA: Diagnosis not present

## 2019-08-02 DIAGNOSIS — U071 COVID-19: Secondary | ICD-10-CM | POA: Diagnosis not present

## 2019-08-02 DIAGNOSIS — Z794 Long term (current) use of insulin: Secondary | ICD-10-CM | POA: Diagnosis not present

## 2019-08-02 DIAGNOSIS — D500 Iron deficiency anemia secondary to blood loss (chronic): Secondary | ICD-10-CM | POA: Diagnosis not present

## 2019-08-02 DIAGNOSIS — E1165 Type 2 diabetes mellitus with hyperglycemia: Secondary | ICD-10-CM | POA: Diagnosis not present

## 2019-08-02 DIAGNOSIS — E785 Hyperlipidemia, unspecified: Secondary | ICD-10-CM | POA: Diagnosis not present

## 2019-08-02 DIAGNOSIS — E1151 Type 2 diabetes mellitus with diabetic peripheral angiopathy without gangrene: Secondary | ICD-10-CM | POA: Diagnosis not present

## 2019-08-02 DIAGNOSIS — J440 Chronic obstructive pulmonary disease with acute lower respiratory infection: Secondary | ICD-10-CM | POA: Diagnosis not present

## 2019-08-02 DIAGNOSIS — Z9989 Dependence on other enabling machines and devices: Secondary | ICD-10-CM | POA: Diagnosis not present

## 2019-08-02 DIAGNOSIS — E860 Dehydration: Secondary | ICD-10-CM | POA: Diagnosis not present

## 2019-08-02 DIAGNOSIS — Z743 Need for continuous supervision: Secondary | ICD-10-CM | POA: Diagnosis not present

## 2019-08-02 DIAGNOSIS — L97419 Non-pressure chronic ulcer of right heel and midfoot with unspecified severity: Secondary | ICD-10-CM | POA: Diagnosis not present

## 2019-08-02 DIAGNOSIS — I739 Peripheral vascular disease, unspecified: Secondary | ICD-10-CM | POA: Diagnosis not present

## 2019-08-02 DIAGNOSIS — I5033 Acute on chronic diastolic (congestive) heart failure: Secondary | ICD-10-CM | POA: Diagnosis not present

## 2019-08-02 DIAGNOSIS — J9600 Acute respiratory failure, unspecified whether with hypoxia or hypercapnia: Secondary | ICD-10-CM | POA: Diagnosis not present

## 2019-08-02 DIAGNOSIS — K219 Gastro-esophageal reflux disease without esophagitis: Secondary | ICD-10-CM | POA: Diagnosis not present

## 2019-08-02 DIAGNOSIS — Z87891 Personal history of nicotine dependence: Secondary | ICD-10-CM | POA: Diagnosis not present

## 2019-08-02 DIAGNOSIS — E1159 Type 2 diabetes mellitus with other circulatory complications: Secondary | ICD-10-CM | POA: Diagnosis not present

## 2019-08-02 DIAGNOSIS — E46 Unspecified protein-calorie malnutrition: Secondary | ICD-10-CM | POA: Diagnosis not present

## 2019-08-02 DIAGNOSIS — R339 Retention of urine, unspecified: Secondary | ICD-10-CM | POA: Diagnosis not present

## 2019-08-02 DIAGNOSIS — N390 Urinary tract infection, site not specified: Secondary | ICD-10-CM | POA: Diagnosis not present

## 2019-08-02 DIAGNOSIS — B3749 Other urogenital candidiasis: Secondary | ICD-10-CM | POA: Diagnosis not present

## 2019-08-02 DIAGNOSIS — J439 Emphysema, unspecified: Secondary | ICD-10-CM | POA: Diagnosis not present

## 2019-08-02 DIAGNOSIS — R404 Transient alteration of awareness: Secondary | ICD-10-CM | POA: Diagnosis not present

## 2019-08-02 DIAGNOSIS — Z7401 Bed confinement status: Secondary | ICD-10-CM | POA: Diagnosis not present

## 2019-08-02 DIAGNOSIS — E873 Alkalosis: Secondary | ICD-10-CM | POA: Diagnosis not present

## 2019-08-02 DIAGNOSIS — I110 Hypertensive heart disease with heart failure: Secondary | ICD-10-CM | POA: Diagnosis not present

## 2019-08-02 DIAGNOSIS — I700 Atherosclerosis of aorta: Secondary | ICD-10-CM | POA: Diagnosis not present

## 2019-08-02 DIAGNOSIS — I5032 Chronic diastolic (congestive) heart failure: Secondary | ICD-10-CM | POA: Diagnosis not present

## 2019-08-02 DIAGNOSIS — R5381 Other malaise: Secondary | ICD-10-CM | POA: Diagnosis not present

## 2019-08-02 DIAGNOSIS — R911 Solitary pulmonary nodule: Secondary | ICD-10-CM | POA: Diagnosis not present

## 2019-08-02 DIAGNOSIS — N289 Disorder of kidney and ureter, unspecified: Secondary | ICD-10-CM | POA: Diagnosis not present

## 2019-08-02 DIAGNOSIS — I509 Heart failure, unspecified: Secondary | ICD-10-CM | POA: Diagnosis not present

## 2019-08-02 DIAGNOSIS — R7989 Other specified abnormal findings of blood chemistry: Secondary | ICD-10-CM | POA: Diagnosis not present

## 2019-08-02 DIAGNOSIS — K7460 Unspecified cirrhosis of liver: Secondary | ICD-10-CM | POA: Diagnosis not present

## 2019-08-02 DIAGNOSIS — E1142 Type 2 diabetes mellitus with diabetic polyneuropathy: Secondary | ICD-10-CM | POA: Diagnosis not present

## 2019-08-02 DIAGNOSIS — G4733 Obstructive sleep apnea (adult) (pediatric): Secondary | ICD-10-CM | POA: Diagnosis not present

## 2019-08-02 DIAGNOSIS — I4510 Unspecified right bundle-branch block: Secondary | ICD-10-CM | POA: Diagnosis not present

## 2019-08-02 DIAGNOSIS — J9621 Acute and chronic respiratory failure with hypoxia: Secondary | ICD-10-CM | POA: Diagnosis not present

## 2019-08-02 DIAGNOSIS — M2550 Pain in unspecified joint: Secondary | ICD-10-CM | POA: Diagnosis not present

## 2019-08-02 DIAGNOSIS — R069 Unspecified abnormalities of breathing: Secondary | ICD-10-CM | POA: Diagnosis not present

## 2019-08-02 DIAGNOSIS — I70209 Unspecified atherosclerosis of native arteries of extremities, unspecified extremity: Secondary | ICD-10-CM | POA: Diagnosis not present

## 2019-08-02 DIAGNOSIS — M8580 Other specified disorders of bone density and structure, unspecified site: Secondary | ICD-10-CM | POA: Diagnosis not present

## 2019-08-02 DIAGNOSIS — I517 Cardiomegaly: Secondary | ICD-10-CM | POA: Diagnosis not present

## 2019-08-02 DIAGNOSIS — I4891 Unspecified atrial fibrillation: Secondary | ICD-10-CM | POA: Diagnosis not present

## 2019-08-02 DIAGNOSIS — Z79899 Other long term (current) drug therapy: Secondary | ICD-10-CM | POA: Diagnosis not present

## 2019-08-02 DIAGNOSIS — K760 Fatty (change of) liver, not elsewhere classified: Secondary | ICD-10-CM | POA: Diagnosis not present

## 2019-08-02 DIAGNOSIS — A419 Sepsis, unspecified organism: Secondary | ICD-10-CM | POA: Diagnosis not present

## 2019-08-02 DIAGNOSIS — R531 Weakness: Secondary | ICD-10-CM | POA: Diagnosis not present

## 2019-08-02 DIAGNOSIS — R161 Splenomegaly, not elsewhere classified: Secondary | ICD-10-CM | POA: Diagnosis not present

## 2019-08-02 DIAGNOSIS — J449 Chronic obstructive pulmonary disease, unspecified: Secondary | ICD-10-CM | POA: Diagnosis not present

## 2019-08-02 DIAGNOSIS — K7290 Hepatic failure, unspecified without coma: Secondary | ICD-10-CM | POA: Diagnosis not present

## 2019-08-02 DIAGNOSIS — K5520 Angiodysplasia of colon without hemorrhage: Secondary | ICD-10-CM | POA: Diagnosis not present

## 2019-08-02 DIAGNOSIS — K5900 Constipation, unspecified: Secondary | ICD-10-CM | POA: Diagnosis not present

## 2019-08-02 DIAGNOSIS — R0902 Hypoxemia: Secondary | ICD-10-CM | POA: Diagnosis not present

## 2019-08-02 DIAGNOSIS — Z9181 History of falling: Secondary | ICD-10-CM | POA: Diagnosis not present

## 2019-08-02 DIAGNOSIS — G9341 Metabolic encephalopathy: Secondary | ICD-10-CM | POA: Diagnosis not present

## 2019-08-02 DIAGNOSIS — J9622 Acute and chronic respiratory failure with hypercapnia: Secondary | ICD-10-CM | POA: Diagnosis not present

## 2019-08-02 DIAGNOSIS — M159 Polyosteoarthritis, unspecified: Secondary | ICD-10-CM | POA: Diagnosis not present

## 2019-08-02 DIAGNOSIS — M109 Gout, unspecified: Secondary | ICD-10-CM | POA: Diagnosis not present

## 2019-08-03 DIAGNOSIS — J9621 Acute and chronic respiratory failure with hypoxia: Secondary | ICD-10-CM | POA: Diagnosis not present

## 2019-08-03 DIAGNOSIS — U071 COVID-19: Secondary | ICD-10-CM | POA: Diagnosis not present

## 2019-08-03 DIAGNOSIS — N390 Urinary tract infection, site not specified: Secondary | ICD-10-CM | POA: Diagnosis not present

## 2019-08-03 DIAGNOSIS — E873 Alkalosis: Secondary | ICD-10-CM | POA: Diagnosis not present

## 2019-08-04 DIAGNOSIS — E873 Alkalosis: Secondary | ICD-10-CM | POA: Diagnosis not present

## 2019-08-04 DIAGNOSIS — N390 Urinary tract infection, site not specified: Secondary | ICD-10-CM | POA: Diagnosis not present

## 2019-08-04 DIAGNOSIS — J9621 Acute and chronic respiratory failure with hypoxia: Secondary | ICD-10-CM | POA: Diagnosis not present

## 2019-08-04 DIAGNOSIS — U071 COVID-19: Secondary | ICD-10-CM | POA: Diagnosis not present

## 2019-08-05 DIAGNOSIS — U071 COVID-19: Secondary | ICD-10-CM | POA: Diagnosis not present

## 2019-08-05 DIAGNOSIS — E873 Alkalosis: Secondary | ICD-10-CM | POA: Diagnosis not present

## 2019-08-05 DIAGNOSIS — N390 Urinary tract infection, site not specified: Secondary | ICD-10-CM | POA: Diagnosis not present

## 2019-08-05 DIAGNOSIS — J9621 Acute and chronic respiratory failure with hypoxia: Secondary | ICD-10-CM | POA: Diagnosis not present

## 2019-08-06 DIAGNOSIS — E873 Alkalosis: Secondary | ICD-10-CM | POA: Diagnosis not present

## 2019-08-06 DIAGNOSIS — J9621 Acute and chronic respiratory failure with hypoxia: Secondary | ICD-10-CM | POA: Diagnosis not present

## 2019-08-06 DIAGNOSIS — N390 Urinary tract infection, site not specified: Secondary | ICD-10-CM | POA: Diagnosis not present

## 2019-08-06 DIAGNOSIS — U071 COVID-19: Secondary | ICD-10-CM | POA: Diagnosis not present

## 2019-08-07 DIAGNOSIS — E873 Alkalosis: Secondary | ICD-10-CM | POA: Diagnosis not present

## 2019-08-07 DIAGNOSIS — J9621 Acute and chronic respiratory failure with hypoxia: Secondary | ICD-10-CM | POA: Diagnosis not present

## 2019-08-07 DIAGNOSIS — N390 Urinary tract infection, site not specified: Secondary | ICD-10-CM | POA: Diagnosis not present

## 2019-08-07 DIAGNOSIS — U071 COVID-19: Secondary | ICD-10-CM | POA: Diagnosis not present

## 2019-08-12 DIAGNOSIS — N179 Acute kidney failure, unspecified: Secondary | ICD-10-CM | POA: Diagnosis not present

## 2019-08-12 DIAGNOSIS — J440 Chronic obstructive pulmonary disease with acute lower respiratory infection: Secondary | ICD-10-CM | POA: Diagnosis not present

## 2019-08-12 DIAGNOSIS — I4510 Unspecified right bundle-branch block: Secondary | ICD-10-CM | POA: Diagnosis not present

## 2019-08-12 DIAGNOSIS — R531 Weakness: Secondary | ICD-10-CM | POA: Diagnosis not present

## 2019-08-12 DIAGNOSIS — R918 Other nonspecific abnormal finding of lung field: Secondary | ICD-10-CM | POA: Diagnosis not present

## 2019-08-12 DIAGNOSIS — R0902 Hypoxemia: Secondary | ICD-10-CM | POA: Diagnosis not present

## 2019-08-12 DIAGNOSIS — Z743 Need for continuous supervision: Secondary | ICD-10-CM | POA: Diagnosis not present

## 2019-08-12 DIAGNOSIS — R7989 Other specified abnormal findings of blood chemistry: Secondary | ICD-10-CM | POA: Diagnosis not present

## 2019-08-12 DIAGNOSIS — R0689 Other abnormalities of breathing: Secondary | ICD-10-CM | POA: Diagnosis not present

## 2019-08-12 DIAGNOSIS — I959 Hypotension, unspecified: Secondary | ICD-10-CM | POA: Diagnosis not present

## 2019-08-12 DIAGNOSIS — D649 Anemia, unspecified: Secondary | ICD-10-CM | POA: Diagnosis not present

## 2019-08-13 DIAGNOSIS — J80 Acute respiratory distress syndrome: Secondary | ICD-10-CM | POA: Diagnosis not present

## 2019-08-13 DIAGNOSIS — R279 Unspecified lack of coordination: Secondary | ICD-10-CM | POA: Diagnosis not present

## 2019-08-13 DIAGNOSIS — I5032 Chronic diastolic (congestive) heart failure: Secondary | ICD-10-CM | POA: Diagnosis not present

## 2019-08-13 DIAGNOSIS — L89610 Pressure ulcer of right heel, unstageable: Secondary | ICD-10-CM | POA: Diagnosis not present

## 2019-08-13 DIAGNOSIS — J9621 Acute and chronic respiratory failure with hypoxia: Secondary | ICD-10-CM | POA: Diagnosis not present

## 2019-08-13 DIAGNOSIS — I110 Hypertensive heart disease with heart failure: Secondary | ICD-10-CM | POA: Diagnosis not present

## 2019-08-13 DIAGNOSIS — D631 Anemia in chronic kidney disease: Secondary | ICD-10-CM | POA: Diagnosis not present

## 2019-08-13 DIAGNOSIS — J984 Other disorders of lung: Secondary | ICD-10-CM | POA: Diagnosis not present

## 2019-08-13 DIAGNOSIS — J440 Chronic obstructive pulmonary disease with acute lower respiratory infection: Secondary | ICD-10-CM | POA: Diagnosis not present

## 2019-08-13 DIAGNOSIS — I130 Hypertensive heart and chronic kidney disease with heart failure and stage 1 through stage 4 chronic kidney disease, or unspecified chronic kidney disease: Secondary | ICD-10-CM | POA: Diagnosis not present

## 2019-08-13 DIAGNOSIS — D649 Anemia, unspecified: Secondary | ICD-10-CM | POA: Diagnosis not present

## 2019-08-13 DIAGNOSIS — I517 Cardiomegaly: Secondary | ICD-10-CM | POA: Diagnosis not present

## 2019-08-13 DIAGNOSIS — L97429 Non-pressure chronic ulcer of left heel and midfoot with unspecified severity: Secondary | ICD-10-CM | POA: Diagnosis not present

## 2019-08-13 DIAGNOSIS — J441 Chronic obstructive pulmonary disease with (acute) exacerbation: Secondary | ICD-10-CM | POA: Diagnosis not present

## 2019-08-13 DIAGNOSIS — L97419 Non-pressure chronic ulcer of right heel and midfoot with unspecified severity: Secondary | ICD-10-CM | POA: Diagnosis not present

## 2019-08-13 DIAGNOSIS — J9600 Acute respiratory failure, unspecified whether with hypoxia or hypercapnia: Secondary | ICD-10-CM | POA: Diagnosis not present

## 2019-08-13 DIAGNOSIS — U071 COVID-19: Secondary | ICD-10-CM | POA: Diagnosis not present

## 2019-08-13 DIAGNOSIS — N182 Chronic kidney disease, stage 2 (mild): Secondary | ICD-10-CM | POA: Diagnosis not present

## 2019-08-13 DIAGNOSIS — E1122 Type 2 diabetes mellitus with diabetic chronic kidney disease: Secondary | ICD-10-CM | POA: Diagnosis not present

## 2019-08-13 DIAGNOSIS — R0602 Shortness of breath: Secondary | ICD-10-CM | POA: Diagnosis not present

## 2019-08-13 DIAGNOSIS — N179 Acute kidney failure, unspecified: Secondary | ICD-10-CM | POA: Diagnosis not present

## 2019-08-13 DIAGNOSIS — E1165 Type 2 diabetes mellitus with hyperglycemia: Secondary | ICD-10-CM | POA: Diagnosis not present

## 2019-08-13 DIAGNOSIS — J449 Chronic obstructive pulmonary disease, unspecified: Secondary | ICD-10-CM | POA: Diagnosis not present

## 2019-08-13 DIAGNOSIS — Z743 Need for continuous supervision: Secondary | ICD-10-CM | POA: Diagnosis not present

## 2019-08-13 DIAGNOSIS — J9622 Acute and chronic respiratory failure with hypercapnia: Secondary | ICD-10-CM | POA: Diagnosis not present

## 2019-08-13 DIAGNOSIS — I959 Hypotension, unspecified: Secondary | ICD-10-CM | POA: Diagnosis not present

## 2019-08-25 DIAGNOSIS — J9621 Acute and chronic respiratory failure with hypoxia: Secondary | ICD-10-CM | POA: Diagnosis not present

## 2019-08-25 DIAGNOSIS — J449 Chronic obstructive pulmonary disease, unspecified: Secondary | ICD-10-CM | POA: Diagnosis not present

## 2019-08-31 DIAGNOSIS — I10 Essential (primary) hypertension: Secondary | ICD-10-CM | POA: Diagnosis not present

## 2019-08-31 DIAGNOSIS — I4510 Unspecified right bundle-branch block: Secondary | ICD-10-CM | POA: Diagnosis not present

## 2019-08-31 DIAGNOSIS — E785 Hyperlipidemia, unspecified: Secondary | ICD-10-CM | POA: Diagnosis not present

## 2019-08-31 DIAGNOSIS — K219 Gastro-esophageal reflux disease without esophagitis: Secondary | ICD-10-CM | POA: Diagnosis not present

## 2019-08-31 DIAGNOSIS — J449 Chronic obstructive pulmonary disease, unspecified: Secondary | ICD-10-CM | POA: Diagnosis not present

## 2019-08-31 DIAGNOSIS — E118 Type 2 diabetes mellitus with unspecified complications: Secondary | ICD-10-CM | POA: Diagnosis not present

## 2019-09-01 DIAGNOSIS — J449 Chronic obstructive pulmonary disease, unspecified: Secondary | ICD-10-CM | POA: Diagnosis not present

## 2019-09-01 DIAGNOSIS — I509 Heart failure, unspecified: Secondary | ICD-10-CM | POA: Diagnosis not present

## 2019-09-01 DIAGNOSIS — I739 Peripheral vascular disease, unspecified: Secondary | ICD-10-CM | POA: Diagnosis not present

## 2019-09-05 DIAGNOSIS — E11621 Type 2 diabetes mellitus with foot ulcer: Secondary | ICD-10-CM | POA: Diagnosis not present

## 2019-09-05 DIAGNOSIS — I10 Essential (primary) hypertension: Secondary | ICD-10-CM | POA: Diagnosis not present

## 2019-09-05 DIAGNOSIS — L89612 Pressure ulcer of right heel, stage 2: Secondary | ICD-10-CM | POA: Diagnosis not present

## 2019-09-05 DIAGNOSIS — L97412 Non-pressure chronic ulcer of right heel and midfoot with fat layer exposed: Secondary | ICD-10-CM | POA: Diagnosis not present

## 2019-09-05 DIAGNOSIS — L97422 Non-pressure chronic ulcer of left heel and midfoot with fat layer exposed: Secondary | ICD-10-CM | POA: Diagnosis not present

## 2019-09-05 DIAGNOSIS — L89622 Pressure ulcer of left heel, stage 2: Secondary | ICD-10-CM | POA: Diagnosis not present

## 2019-09-06 DIAGNOSIS — J449 Chronic obstructive pulmonary disease, unspecified: Secondary | ICD-10-CM | POA: Diagnosis not present

## 2019-09-19 DIAGNOSIS — E11621 Type 2 diabetes mellitus with foot ulcer: Secondary | ICD-10-CM | POA: Diagnosis not present

## 2019-09-19 DIAGNOSIS — E1169 Type 2 diabetes mellitus with other specified complication: Secondary | ICD-10-CM | POA: Diagnosis not present

## 2019-09-19 DIAGNOSIS — I110 Hypertensive heart disease with heart failure: Secondary | ICD-10-CM | POA: Diagnosis not present

## 2019-09-19 DIAGNOSIS — I10 Essential (primary) hypertension: Secondary | ICD-10-CM | POA: Diagnosis not present

## 2019-09-19 DIAGNOSIS — I5032 Chronic diastolic (congestive) heart failure: Secondary | ICD-10-CM | POA: Diagnosis not present

## 2019-09-19 DIAGNOSIS — L97412 Non-pressure chronic ulcer of right heel and midfoot with fat layer exposed: Secondary | ICD-10-CM | POA: Diagnosis not present

## 2019-09-19 DIAGNOSIS — L89619 Pressure ulcer of right heel, unspecified stage: Secondary | ICD-10-CM | POA: Diagnosis not present

## 2019-09-19 DIAGNOSIS — L89629 Pressure ulcer of left heel, unspecified stage: Secondary | ICD-10-CM | POA: Diagnosis not present

## 2019-09-19 DIAGNOSIS — J449 Chronic obstructive pulmonary disease, unspecified: Secondary | ICD-10-CM | POA: Diagnosis not present

## 2019-09-19 DIAGNOSIS — L89610 Pressure ulcer of right heel, unstageable: Secondary | ICD-10-CM | POA: Diagnosis not present

## 2019-09-25 DIAGNOSIS — J9621 Acute and chronic respiratory failure with hypoxia: Secondary | ICD-10-CM | POA: Diagnosis not present

## 2019-09-25 DIAGNOSIS — J449 Chronic obstructive pulmonary disease, unspecified: Secondary | ICD-10-CM | POA: Diagnosis not present

## 2019-09-29 ENCOUNTER — Ambulatory Visit (INDEPENDENT_AMBULATORY_CARE_PROVIDER_SITE_OTHER): Payer: Medicare Other | Admitting: Sports Medicine

## 2019-09-29 ENCOUNTER — Encounter: Payer: Self-pay | Admitting: Sports Medicine

## 2019-09-29 DIAGNOSIS — M79674 Pain in right toe(s): Secondary | ICD-10-CM

## 2019-09-29 DIAGNOSIS — I890 Lymphedema, not elsewhere classified: Secondary | ICD-10-CM | POA: Diagnosis not present

## 2019-09-29 DIAGNOSIS — I739 Peripheral vascular disease, unspecified: Secondary | ICD-10-CM

## 2019-09-29 DIAGNOSIS — L89629 Pressure ulcer of left heel, unspecified stage: Secondary | ICD-10-CM

## 2019-09-29 DIAGNOSIS — B351 Tinea unguium: Secondary | ICD-10-CM

## 2019-09-29 DIAGNOSIS — M79675 Pain in left toe(s): Secondary | ICD-10-CM | POA: Diagnosis not present

## 2019-09-29 DIAGNOSIS — L89619 Pressure ulcer of right heel, unspecified stage: Secondary | ICD-10-CM

## 2019-09-29 NOTE — Progress Notes (Signed)
Subjective: Cassandra Barnes is a 74 y.o. female patient with history of diabetes who presents to office today complaining of long,mildly painful nails; unable to trim in wheelchair via Rcats from facilty. Patient states that the glucose reading this morning was 151 mg/dl. Patient denies any new changes in medication or new problems, going to wound center for heels and reports that they are healing well with next appt on Tuesday.  Last PCP Dr. Truman Hayward x 3 weeks ago.  There are no problems to display for this patient.  Current Outpatient Medications on File Prior to Visit  Medication Sig Dispense Refill   acetaminophen (TYLENOL) 325 MG tablet Take 650 mg by mouth every 6 (six) hours as needed.     albuterol (PROVENTIL) (2.5 MG/3ML) 0.083% nebulizer solution Take by nebulization.     allopurinol (ZYLOPRIM) 100 MG tablet Take 100 mg by mouth daily.     amoxicillin-clavulanate (AUGMENTIN) 250-125 MG tablet Take 1 tablet by mouth 2 (two) times daily.     aspirin 325 MG tablet Take 325 mg by mouth daily.     aspirin EC 81 MG tablet Take 81 mg by mouth daily.     clopidogrel (PLAVIX) 75 MG tablet Take 75 mg by mouth daily.     colchicine 0.6 MG tablet      docusate sodium (COLACE) 100 MG capsule Take 100 mg by mouth daily. Daughter reports taking OTC stool softener 100 mg QD per PCP instructions     docusate sodium (COLACE) 250 MG capsule Take 250 mg by mouth daily.     Febuxostat (ULORIC) 80 MG TABS      ferrous sulfate 325 (65 FE) MG EC tablet Take 325 mg by mouth daily with breakfast.     furosemide (LASIX) 80 MG tablet      gabapentin (NEURONTIN) 600 MG tablet Take 600 mg by mouth 3 (three) times daily.     insulin glargine (LANTUS) 100 UNIT/ML injection Inject 38 Units into the skin 2 (two) times daily. Daughter reports PCP prescribed Lantus pen, 38 U BID     insulin regular (NOVOLIN R) 100 units/mL injection Inject 8 Units into the skin 3 (three) times daily before meals. Daughter  reports taking TIC/ AC; uses sliding scale 0-8 U according to PCP instructions     levofloxacin (LEVAQUIN) 500 MG tablet Take 500 mg by mouth daily. Take every day x 10 days- prescribed at time of ED visit at [facility] 03/26/2019     meloxicam (MOBIC) 15 MG tablet Take 15 mg by mouth daily.     metoprolol succinate (TOPROL-XL) 25 MG 24 hr tablet Take 25 mg by mouth daily.     Multiple Vitamin (MULTIVITAMIN WITH MINERALS) TABS tablet Take 1 tablet by mouth daily.     nystatin (MYCOSTATIN/NYSTOP) powder Apply topically 4 (four) times daily.     omeprazole (PRILOSEC) 20 MG capsule Take 20 mg by mouth daily.     ondansetron (ZOFRAN) 4 MG tablet Take 4 mg by mouth every 8 (eight) hours as needed for nausea or vomiting.     pantoprazole (PROTONIX) 40 MG tablet Take 40 mg by mouth daily.     promethazine (PHENERGAN) 25 MG tablet Take 25 mg by mouth every 6 (six) hours as needed for nausea or vomiting.     torsemide (DEMADEX) 20 MG tablet Take 20 mg by mouth daily. Daughter reports taking (2) 20 mg (for total dose of 40 mg) po QD     No current facility-administered  medications on file prior to visit.   Allergies  Allergen Reactions   Codeine     No results found for this or any previous visit (from the past 2160 hour(s)).  Objective: General: Patient is awake, alert, and oriented x 3 and in no acute distress in wheelchair.  Integument: Skin is warm, dry and supple bilateral. Nails are tender, long, thickened and dystrophic with subungual debris, consistent with onychomycosis, 1-5 bilateral. + wraps to heel, history of ulcer. Remaining integument unremarkable.  Vasculature:  Dorsalis Pedis pulse 0/4 bilateral. Posterior Tibial pulse  0/4 bilateral. Chronic venous skin changes and lymphedema present.  Neurology: The patient has absent sensation bilateral.  Musculoskeletal:Wheelchair bound. No tenderness with calf compression bilateral.  Assessment and Plan: Problem List  Items Addressed This Visit    None    Visit Diagnoses    Pain due to onychomycosis of toenails of both feet    -  Primary   PVD (peripheral vascular disease) ([place])       Lymphedema       Decubitus ulcer of heel, bilateral          -Examined patient. -Discussed and educated patient on diabetic foot care, especially with  regards to the vascular, neurological and musculoskeletal systems.  -Stressed the importance of good glycemic control and the detriment of not  controlling glucose levels in relation to the foot. -Mechanically debrided all nails 1-5 bilateral using sterile nail nipper and filed with dremel without incident  -Continue with wound care center follow up for bilateral heel ulcers -Answered all patient questions -Patient to return  in 3-4 months for at risk foot care -Patient advised to call the office if any problems or questions arise in the meantime.  Landis Martins, DPM

## 2019-10-02 DIAGNOSIS — I739 Peripheral vascular disease, unspecified: Secondary | ICD-10-CM | POA: Diagnosis not present

## 2019-10-02 DIAGNOSIS — J449 Chronic obstructive pulmonary disease, unspecified: Secondary | ICD-10-CM | POA: Diagnosis not present

## 2019-10-02 DIAGNOSIS — I509 Heart failure, unspecified: Secondary | ICD-10-CM | POA: Diagnosis not present

## 2019-10-03 DIAGNOSIS — L97412 Non-pressure chronic ulcer of right heel and midfoot with fat layer exposed: Secondary | ICD-10-CM | POA: Diagnosis not present

## 2019-10-03 DIAGNOSIS — E11621 Type 2 diabetes mellitus with foot ulcer: Secondary | ICD-10-CM | POA: Diagnosis not present

## 2019-10-03 DIAGNOSIS — L97422 Non-pressure chronic ulcer of left heel and midfoot with fat layer exposed: Secondary | ICD-10-CM | POA: Diagnosis not present

## 2019-10-03 DIAGNOSIS — I10 Essential (primary) hypertension: Secondary | ICD-10-CM | POA: Diagnosis not present

## 2019-10-06 DIAGNOSIS — L89612 Pressure ulcer of right heel, stage 2: Secondary | ICD-10-CM | POA: Diagnosis not present

## 2019-10-06 DIAGNOSIS — I890 Lymphedema, not elsewhere classified: Secondary | ICD-10-CM | POA: Diagnosis not present

## 2019-10-06 DIAGNOSIS — L89623 Pressure ulcer of left heel, stage 3: Secondary | ICD-10-CM | POA: Diagnosis not present

## 2019-10-06 DIAGNOSIS — J449 Chronic obstructive pulmonary disease, unspecified: Secondary | ICD-10-CM | POA: Diagnosis not present

## 2019-10-06 DIAGNOSIS — Z794 Long term (current) use of insulin: Secondary | ICD-10-CM | POA: Diagnosis not present

## 2019-10-06 DIAGNOSIS — I872 Venous insufficiency (chronic) (peripheral): Secondary | ICD-10-CM | POA: Diagnosis not present

## 2019-10-06 DIAGNOSIS — I10 Essential (primary) hypertension: Secondary | ICD-10-CM | POA: Diagnosis not present

## 2019-10-06 DIAGNOSIS — L89322 Pressure ulcer of left buttock, stage 2: Secondary | ICD-10-CM | POA: Diagnosis not present

## 2019-10-06 DIAGNOSIS — E1151 Type 2 diabetes mellitus with diabetic peripheral angiopathy without gangrene: Secondary | ICD-10-CM | POA: Diagnosis not present

## 2019-10-13 DIAGNOSIS — L89612 Pressure ulcer of right heel, stage 2: Secondary | ICD-10-CM | POA: Diagnosis not present

## 2019-10-13 DIAGNOSIS — Z794 Long term (current) use of insulin: Secondary | ICD-10-CM | POA: Diagnosis not present

## 2019-10-13 DIAGNOSIS — L89322 Pressure ulcer of left buttock, stage 2: Secondary | ICD-10-CM | POA: Diagnosis not present

## 2019-10-13 DIAGNOSIS — I10 Essential (primary) hypertension: Secondary | ICD-10-CM | POA: Diagnosis not present

## 2019-10-13 DIAGNOSIS — I890 Lymphedema, not elsewhere classified: Secondary | ICD-10-CM | POA: Diagnosis not present

## 2019-10-13 DIAGNOSIS — L89623 Pressure ulcer of left heel, stage 3: Secondary | ICD-10-CM | POA: Diagnosis not present

## 2019-10-13 DIAGNOSIS — I872 Venous insufficiency (chronic) (peripheral): Secondary | ICD-10-CM | POA: Diagnosis not present

## 2019-10-13 DIAGNOSIS — E1151 Type 2 diabetes mellitus with diabetic peripheral angiopathy without gangrene: Secondary | ICD-10-CM | POA: Diagnosis not present

## 2019-10-16 DIAGNOSIS — Z794 Long term (current) use of insulin: Secondary | ICD-10-CM | POA: Diagnosis not present

## 2019-10-16 DIAGNOSIS — I10 Essential (primary) hypertension: Secondary | ICD-10-CM | POA: Diagnosis not present

## 2019-10-16 DIAGNOSIS — I872 Venous insufficiency (chronic) (peripheral): Secondary | ICD-10-CM | POA: Diagnosis not present

## 2019-10-16 DIAGNOSIS — I890 Lymphedema, not elsewhere classified: Secondary | ICD-10-CM | POA: Diagnosis not present

## 2019-10-16 DIAGNOSIS — L89322 Pressure ulcer of left buttock, stage 2: Secondary | ICD-10-CM | POA: Diagnosis not present

## 2019-10-16 DIAGNOSIS — L89612 Pressure ulcer of right heel, stage 2: Secondary | ICD-10-CM | POA: Diagnosis not present

## 2019-10-16 DIAGNOSIS — E1151 Type 2 diabetes mellitus with diabetic peripheral angiopathy without gangrene: Secondary | ICD-10-CM | POA: Diagnosis not present

## 2019-10-16 DIAGNOSIS — L89623 Pressure ulcer of left heel, stage 3: Secondary | ICD-10-CM | POA: Diagnosis not present

## 2019-10-18 DIAGNOSIS — E1151 Type 2 diabetes mellitus with diabetic peripheral angiopathy without gangrene: Secondary | ICD-10-CM | POA: Diagnosis not present

## 2019-10-18 DIAGNOSIS — Z794 Long term (current) use of insulin: Secondary | ICD-10-CM | POA: Diagnosis not present

## 2019-10-18 DIAGNOSIS — L89612 Pressure ulcer of right heel, stage 2: Secondary | ICD-10-CM | POA: Diagnosis not present

## 2019-10-18 DIAGNOSIS — L89322 Pressure ulcer of left buttock, stage 2: Secondary | ICD-10-CM | POA: Diagnosis not present

## 2019-10-18 DIAGNOSIS — I890 Lymphedema, not elsewhere classified: Secondary | ICD-10-CM | POA: Diagnosis not present

## 2019-10-18 DIAGNOSIS — I872 Venous insufficiency (chronic) (peripheral): Secondary | ICD-10-CM | POA: Diagnosis not present

## 2019-10-18 DIAGNOSIS — I10 Essential (primary) hypertension: Secondary | ICD-10-CM | POA: Diagnosis not present

## 2019-10-18 DIAGNOSIS — L89623 Pressure ulcer of left heel, stage 3: Secondary | ICD-10-CM | POA: Diagnosis not present

## 2019-10-19 DIAGNOSIS — I5032 Chronic diastolic (congestive) heart failure: Secondary | ICD-10-CM | POA: Diagnosis not present

## 2019-10-19 DIAGNOSIS — L89610 Pressure ulcer of right heel, unstageable: Secondary | ICD-10-CM | POA: Diagnosis not present

## 2019-10-19 DIAGNOSIS — I110 Hypertensive heart disease with heart failure: Secondary | ICD-10-CM | POA: Diagnosis not present

## 2019-10-19 DIAGNOSIS — J449 Chronic obstructive pulmonary disease, unspecified: Secondary | ICD-10-CM | POA: Diagnosis not present

## 2019-10-20 DIAGNOSIS — L89612 Pressure ulcer of right heel, stage 2: Secondary | ICD-10-CM | POA: Diagnosis not present

## 2019-10-20 DIAGNOSIS — I872 Venous insufficiency (chronic) (peripheral): Secondary | ICD-10-CM | POA: Diagnosis not present

## 2019-10-20 DIAGNOSIS — E1151 Type 2 diabetes mellitus with diabetic peripheral angiopathy without gangrene: Secondary | ICD-10-CM | POA: Diagnosis not present

## 2019-10-20 DIAGNOSIS — L89623 Pressure ulcer of left heel, stage 3: Secondary | ICD-10-CM | POA: Diagnosis not present

## 2019-10-20 DIAGNOSIS — I890 Lymphedema, not elsewhere classified: Secondary | ICD-10-CM | POA: Diagnosis not present

## 2019-10-20 DIAGNOSIS — I10 Essential (primary) hypertension: Secondary | ICD-10-CM | POA: Diagnosis not present

## 2019-10-20 DIAGNOSIS — L89322 Pressure ulcer of left buttock, stage 2: Secondary | ICD-10-CM | POA: Diagnosis not present

## 2019-10-20 DIAGNOSIS — Z794 Long term (current) use of insulin: Secondary | ICD-10-CM | POA: Diagnosis not present

## 2019-10-24 DIAGNOSIS — E1151 Type 2 diabetes mellitus with diabetic peripheral angiopathy without gangrene: Secondary | ICD-10-CM | POA: Diagnosis not present

## 2019-10-24 DIAGNOSIS — L89623 Pressure ulcer of left heel, stage 3: Secondary | ICD-10-CM | POA: Diagnosis not present

## 2019-10-24 DIAGNOSIS — L89612 Pressure ulcer of right heel, stage 2: Secondary | ICD-10-CM | POA: Diagnosis not present

## 2019-10-24 DIAGNOSIS — Z794 Long term (current) use of insulin: Secondary | ICD-10-CM | POA: Diagnosis not present

## 2019-10-24 DIAGNOSIS — I10 Essential (primary) hypertension: Secondary | ICD-10-CM | POA: Diagnosis not present

## 2019-10-24 DIAGNOSIS — L89322 Pressure ulcer of left buttock, stage 2: Secondary | ICD-10-CM | POA: Diagnosis not present

## 2019-10-24 DIAGNOSIS — I890 Lymphedema, not elsewhere classified: Secondary | ICD-10-CM | POA: Diagnosis not present

## 2019-10-24 DIAGNOSIS — I872 Venous insufficiency (chronic) (peripheral): Secondary | ICD-10-CM | POA: Diagnosis not present

## 2019-10-25 DIAGNOSIS — J449 Chronic obstructive pulmonary disease, unspecified: Secondary | ICD-10-CM | POA: Diagnosis not present

## 2019-10-25 DIAGNOSIS — J9621 Acute and chronic respiratory failure with hypoxia: Secondary | ICD-10-CM | POA: Diagnosis not present

## 2019-10-26 DIAGNOSIS — E1151 Type 2 diabetes mellitus with diabetic peripheral angiopathy without gangrene: Secondary | ICD-10-CM | POA: Diagnosis not present

## 2019-10-26 DIAGNOSIS — I872 Venous insufficiency (chronic) (peripheral): Secondary | ICD-10-CM | POA: Diagnosis not present

## 2019-10-26 DIAGNOSIS — Z794 Long term (current) use of insulin: Secondary | ICD-10-CM | POA: Diagnosis not present

## 2019-10-26 DIAGNOSIS — L89612 Pressure ulcer of right heel, stage 2: Secondary | ICD-10-CM | POA: Diagnosis not present

## 2019-10-26 DIAGNOSIS — I10 Essential (primary) hypertension: Secondary | ICD-10-CM | POA: Diagnosis not present

## 2019-10-26 DIAGNOSIS — I890 Lymphedema, not elsewhere classified: Secondary | ICD-10-CM | POA: Diagnosis not present

## 2019-10-26 DIAGNOSIS — L89322 Pressure ulcer of left buttock, stage 2: Secondary | ICD-10-CM | POA: Diagnosis not present

## 2019-10-26 DIAGNOSIS — L89623 Pressure ulcer of left heel, stage 3: Secondary | ICD-10-CM | POA: Diagnosis not present

## 2019-10-30 ENCOUNTER — Other Ambulatory Visit: Payer: Self-pay

## 2019-10-30 ENCOUNTER — Encounter (HOSPITAL_COMMUNITY): Payer: Self-pay | Admitting: *Deleted

## 2019-10-30 ENCOUNTER — Emergency Department (HOSPITAL_COMMUNITY): Payer: Medicare Other

## 2019-10-30 ENCOUNTER — Inpatient Hospital Stay (HOSPITAL_COMMUNITY)
Admission: EM | Admit: 2019-10-30 | Discharge: 2019-11-07 | DRG: 871 | Disposition: A | Discharge status: Home Health | Payer: Medicare Other | Attending: Internal Medicine | Admitting: Internal Medicine

## 2019-10-30 DIAGNOSIS — M109 Gout, unspecified: Secondary | ICD-10-CM | POA: Diagnosis present

## 2019-10-30 DIAGNOSIS — M2550 Pain in unspecified joint: Secondary | ICD-10-CM | POA: Diagnosis not present

## 2019-10-30 DIAGNOSIS — J9621 Acute and chronic respiratory failure with hypoxia: Secondary | ICD-10-CM | POA: Diagnosis not present

## 2019-10-30 DIAGNOSIS — J449 Chronic obstructive pulmonary disease, unspecified: Secondary | ICD-10-CM

## 2019-10-30 DIAGNOSIS — A4189 Other specified sepsis: Secondary | ICD-10-CM | POA: Diagnosis not present

## 2019-10-30 DIAGNOSIS — I2510 Atherosclerotic heart disease of native coronary artery without angina pectoris: Secondary | ICD-10-CM | POA: Diagnosis not present

## 2019-10-30 DIAGNOSIS — I509 Heart failure, unspecified: Secondary | ICD-10-CM | POA: Diagnosis not present

## 2019-10-30 DIAGNOSIS — R404 Transient alteration of awareness: Secondary | ICD-10-CM | POA: Diagnosis not present

## 2019-10-30 DIAGNOSIS — I739 Peripheral vascular disease, unspecified: Secondary | ICD-10-CM | POA: Diagnosis not present

## 2019-10-30 DIAGNOSIS — J1282 Pneumonia due to coronavirus disease 2019: Secondary | ICD-10-CM | POA: Diagnosis present

## 2019-10-30 DIAGNOSIS — L89620 Pressure ulcer of left heel, unstageable: Secondary | ICD-10-CM | POA: Diagnosis not present

## 2019-10-30 DIAGNOSIS — I110 Hypertensive heart disease with heart failure: Secondary | ICD-10-CM | POA: Diagnosis present

## 2019-10-30 DIAGNOSIS — K7460 Unspecified cirrhosis of liver: Secondary | ICD-10-CM | POA: Diagnosis present

## 2019-10-30 DIAGNOSIS — R300 Dysuria: Secondary | ICD-10-CM | POA: Diagnosis present

## 2019-10-30 DIAGNOSIS — Z79899 Other long term (current) drug therapy: Secondary | ICD-10-CM

## 2019-10-30 DIAGNOSIS — I5033 Acute on chronic diastolic (congestive) heart failure: Secondary | ICD-10-CM | POA: Diagnosis not present

## 2019-10-30 DIAGNOSIS — Z885 Allergy status to narcotic agent status: Secondary | ICD-10-CM

## 2019-10-30 DIAGNOSIS — Z743 Need for continuous supervision: Secondary | ICD-10-CM | POA: Diagnosis not present

## 2019-10-30 DIAGNOSIS — R6520 Severe sepsis without septic shock: Secondary | ICD-10-CM | POA: Diagnosis present

## 2019-10-30 DIAGNOSIS — E1140 Type 2 diabetes mellitus with diabetic neuropathy, unspecified: Secondary | ICD-10-CM | POA: Diagnosis not present

## 2019-10-30 DIAGNOSIS — G9341 Metabolic encephalopathy: Secondary | ICD-10-CM | POA: Diagnosis present

## 2019-10-30 DIAGNOSIS — L89322 Pressure ulcer of left buttock, stage 2: Secondary | ICD-10-CM | POA: Diagnosis not present

## 2019-10-30 DIAGNOSIS — N179 Acute kidney failure, unspecified: Secondary | ICD-10-CM | POA: Diagnosis present

## 2019-10-30 DIAGNOSIS — J440 Chronic obstructive pulmonary disease with acute lower respiratory infection: Secondary | ICD-10-CM | POA: Diagnosis not present

## 2019-10-30 DIAGNOSIS — L89610 Pressure ulcer of right heel, unstageable: Secondary | ICD-10-CM | POA: Diagnosis not present

## 2019-10-30 DIAGNOSIS — J439 Emphysema, unspecified: Secondary | ICD-10-CM | POA: Diagnosis not present

## 2019-10-30 DIAGNOSIS — Z833 Family history of diabetes mellitus: Secondary | ICD-10-CM

## 2019-10-30 DIAGNOSIS — I700 Atherosclerosis of aorta: Secondary | ICD-10-CM | POA: Diagnosis not present

## 2019-10-30 DIAGNOSIS — E1151 Type 2 diabetes mellitus with diabetic peripheral angiopathy without gangrene: Secondary | ICD-10-CM | POA: Diagnosis not present

## 2019-10-30 DIAGNOSIS — D638 Anemia in other chronic diseases classified elsewhere: Secondary | ICD-10-CM | POA: Diagnosis not present

## 2019-10-30 DIAGNOSIS — Z794 Long term (current) use of insulin: Secondary | ICD-10-CM

## 2019-10-30 DIAGNOSIS — J9601 Acute respiratory failure with hypoxia: Secondary | ICD-10-CM | POA: Insufficient documentation

## 2019-10-30 DIAGNOSIS — K219 Gastro-esophageal reflux disease without esophagitis: Secondary | ICD-10-CM | POA: Diagnosis not present

## 2019-10-30 DIAGNOSIS — E875 Hyperkalemia: Secondary | ICD-10-CM | POA: Diagnosis not present

## 2019-10-30 DIAGNOSIS — R001 Bradycardia, unspecified: Secondary | ICD-10-CM | POA: Diagnosis not present

## 2019-10-30 DIAGNOSIS — U071 COVID-19: Secondary | ICD-10-CM | POA: Diagnosis not present

## 2019-10-30 DIAGNOSIS — M7989 Other specified soft tissue disorders: Secondary | ICD-10-CM | POA: Diagnosis not present

## 2019-10-30 DIAGNOSIS — R609 Edema, unspecified: Secondary | ICD-10-CM | POA: Diagnosis not present

## 2019-10-30 DIAGNOSIS — D509 Iron deficiency anemia, unspecified: Secondary | ICD-10-CM | POA: Diagnosis present

## 2019-10-30 DIAGNOSIS — I10 Essential (primary) hypertension: Secondary | ICD-10-CM | POA: Diagnosis present

## 2019-10-30 DIAGNOSIS — Z791 Long term (current) use of non-steroidal anti-inflammatories (NSAID): Secondary | ICD-10-CM

## 2019-10-30 DIAGNOSIS — Z7401 Bed confinement status: Secondary | ICD-10-CM

## 2019-10-30 DIAGNOSIS — L89623 Pressure ulcer of left heel, stage 3: Secondary | ICD-10-CM | POA: Diagnosis not present

## 2019-10-30 DIAGNOSIS — D649 Anemia, unspecified: Secondary | ICD-10-CM | POA: Diagnosis not present

## 2019-10-30 DIAGNOSIS — R52 Pain, unspecified: Secondary | ICD-10-CM | POA: Diagnosis not present

## 2019-10-30 DIAGNOSIS — L89612 Pressure ulcer of right heel, stage 2: Secondary | ICD-10-CM | POA: Diagnosis not present

## 2019-10-30 DIAGNOSIS — M47814 Spondylosis without myelopathy or radiculopathy, thoracic region: Secondary | ICD-10-CM | POA: Diagnosis not present

## 2019-10-30 DIAGNOSIS — E119 Type 2 diabetes mellitus without complications: Secondary | ICD-10-CM | POA: Diagnosis not present

## 2019-10-30 DIAGNOSIS — I890 Lymphedema, not elsewhere classified: Secondary | ICD-10-CM | POA: Diagnosis not present

## 2019-10-30 DIAGNOSIS — Z8616 Personal history of COVID-19: Secondary | ICD-10-CM

## 2019-10-30 DIAGNOSIS — R6889 Other general symptoms and signs: Secondary | ICD-10-CM | POA: Diagnosis not present

## 2019-10-30 DIAGNOSIS — A419 Sepsis, unspecified organism: Secondary | ICD-10-CM | POA: Diagnosis not present

## 2019-10-30 DIAGNOSIS — Z8249 Family history of ischemic heart disease and other diseases of the circulatory system: Secondary | ICD-10-CM

## 2019-10-30 DIAGNOSIS — Z90710 Acquired absence of both cervix and uterus: Secondary | ICD-10-CM

## 2019-10-30 DIAGNOSIS — Z7902 Long term (current) use of antithrombotics/antiplatelets: Secondary | ICD-10-CM

## 2019-10-30 DIAGNOSIS — Z87440 Personal history of urinary (tract) infections: Secondary | ICD-10-CM

## 2019-10-30 DIAGNOSIS — J9611 Chronic respiratory failure with hypoxia: Secondary | ICD-10-CM | POA: Insufficient documentation

## 2019-10-30 DIAGNOSIS — R509 Fever, unspecified: Secondary | ICD-10-CM | POA: Diagnosis not present

## 2019-10-30 DIAGNOSIS — Z6841 Body Mass Index (BMI) 40.0 and over, adult: Secondary | ICD-10-CM | POA: Diagnosis not present

## 2019-10-30 DIAGNOSIS — J9622 Acute and chronic respiratory failure with hypercapnia: Secondary | ICD-10-CM | POA: Insufficient documentation

## 2019-10-30 DIAGNOSIS — Z9981 Dependence on supplemental oxygen: Secondary | ICD-10-CM

## 2019-10-30 DIAGNOSIS — Z87891 Personal history of nicotine dependence: Secondary | ICD-10-CM

## 2019-10-30 DIAGNOSIS — Z7982 Long term (current) use of aspirin: Secondary | ICD-10-CM

## 2019-10-30 DIAGNOSIS — R0602 Shortness of breath: Secondary | ICD-10-CM | POA: Diagnosis not present

## 2019-10-30 DIAGNOSIS — I872 Venous insufficiency (chronic) (peripheral): Secondary | ICD-10-CM | POA: Diagnosis not present

## 2019-10-30 LAB — LACTIC ACID, PLASMA
Lactic Acid, Venous: 2.3 mmol/L (ref 0.5–1.9)
Lactic Acid, Venous: 2 mmol/L (ref 0.5–1.9)
Lactic Acid, Venous: 2.9 mmol/L (ref 0.5–1.9)

## 2019-10-30 LAB — CBC WITH DIFFERENTIAL/PLATELET
Abs Immature Granulocytes: 0.15 10*3/uL — ABNORMAL HIGH (ref 0.00–0.07)
Basophils Absolute: 0 10*3/uL (ref 0.0–0.1)
Basophils Relative: 0 %
Eosinophils Absolute: 0 10*3/uL (ref 0.0–0.5)
Eosinophils Relative: 0 %
HCT: 29.4 % — ABNORMAL LOW (ref 36.0–46.0)
Hemoglobin: 8.1 g/dL — ABNORMAL LOW (ref 12.0–15.0)
Immature Granulocytes: 1 %
Lymphocytes Relative: 9 %
Lymphs Abs: 1.3 10*3/uL (ref 0.7–4.0)
MCH: 27.1 pg (ref 26.0–34.0)
MCHC: 27.6 g/dL — ABNORMAL LOW (ref 30.0–36.0)
MCV: 98.3 fL (ref 80.0–100.0)
Monocytes Absolute: 0.8 10*3/uL (ref 0.1–1.0)
Monocytes Relative: 5 %
Neutro Abs: 11.9 10*3/uL — ABNORMAL HIGH (ref 1.7–7.7)
Neutrophils Relative %: 85 %
Platelets: 182 10*3/uL (ref 150–400)
RBC: 2.99 MIL/uL — ABNORMAL LOW (ref 3.87–5.11)
RDW: 14.7 % (ref 11.5–15.5)
WBC: 14.2 10*3/uL — ABNORMAL HIGH (ref 4.0–10.5)
nRBC: 0 % (ref 0.0–0.2)

## 2019-10-30 LAB — URINALYSIS, ROUTINE W REFLEX MICROSCOPIC
Bilirubin Urine: NEGATIVE
Glucose, UA: NEGATIVE mg/dL
Ketones, ur: NEGATIVE mg/dL
Nitrite: NEGATIVE
Protein, ur: NEGATIVE mg/dL
Specific Gravity, Urine: 1.012 (ref 1.005–1.030)
WBC, UA: >50 WBC/hpf — ABNORMAL HIGH (ref 0–5)
pH: 6 (ref 5.0–8.0)

## 2019-10-30 LAB — BLOOD GAS, ARTERIAL
Acid-Base Excess: 8.3 mmol/L — ABNORMAL HIGH (ref 0.0–2.0)
Bicarbonate: 35 mmol/L — ABNORMAL HIGH (ref 20.0–28.0)
FIO2: 28
O2 Saturation: 92.7 %
Patient temperature: 37.1
pCO2 arterial: 77.9 mmHg (ref 32.0–48.0)
pH, Arterial: 7.275 — ABNORMAL LOW (ref 7.350–7.450)
pO2, Arterial: 72.5 mmHg — ABNORMAL LOW (ref 83.0–108.0)

## 2019-10-30 LAB — PROTIME-INR
INR: 1.1 (ref 0.8–1.2)
Prothrombin Time: 14.1 seconds (ref 11.4–15.2)

## 2019-10-30 LAB — CREATININE, SERUM
Creatinine, Ser: 1.42 mg/dL — ABNORMAL HIGH (ref 0.44–1.00)
GFR calc Af Amer: 43 mL/min — ABNORMAL LOW (ref >60)
GFR calc non Af Amer: 37 mL/min — ABNORMAL LOW (ref >60)

## 2019-10-30 LAB — CBC
HCT: 29.2 % — ABNORMAL LOW (ref 36.0–46.0)
Hemoglobin: 8 g/dL — ABNORMAL LOW (ref 12.0–15.0)
MCH: 27.4 pg (ref 26.0–34.0)
MCHC: 27.4 g/dL — ABNORMAL LOW (ref 30.0–36.0)
MCV: 100 fL (ref 80.0–100.0)
Platelets: 177 10*3/uL (ref 150–400)
RBC: 2.92 MIL/uL — ABNORMAL LOW (ref 3.87–5.11)
RDW: 14.7 % (ref 11.5–15.5)
WBC: 13.4 10*3/uL — ABNORMAL HIGH (ref 4.0–10.5)
nRBC: 0 % (ref 0.0–0.2)

## 2019-10-30 LAB — COMPREHENSIVE METABOLIC PANEL
ALT: 17 U/L (ref 0–44)
AST: 26 U/L (ref 15–41)
Albumin: 2.7 g/dL — ABNORMAL LOW (ref 3.5–5.0)
Alkaline Phosphatase: 64 U/L (ref 38–126)
Anion gap: 7 (ref 5–15)
BUN: 16 mg/dL (ref 8–23)
CO2: 37 mmol/L — ABNORMAL HIGH (ref 22–32)
Calcium: 9.2 mg/dL (ref 8.9–10.3)
Chloride: 92 mmol/L — ABNORMAL LOW (ref 98–111)
Creatinine, Ser: 1.23 mg/dL — ABNORMAL HIGH (ref 0.44–1.00)
GFR calc Af Amer: 51 mL/min — ABNORMAL LOW (ref >60)
GFR calc non Af Amer: 44 mL/min — ABNORMAL LOW (ref >60)
Glucose, Bld: 109 mg/dL — ABNORMAL HIGH (ref 70–99)
Potassium: 4.6 mmol/L (ref 3.5–5.1)
Sodium: 136 mmol/L (ref 135–145)
Total Bilirubin: 0.5 mg/dL (ref 0.3–1.2)
Total Protein: 5.9 g/dL — ABNORMAL LOW (ref 6.5–8.1)

## 2019-10-30 LAB — GLUCOSE, CAPILLARY
Glucose-Capillary: 137 mg/dL — ABNORMAL HIGH (ref 70–99)
Glucose-Capillary: 196 mg/dL — ABNORMAL HIGH (ref 70–99)

## 2019-10-30 LAB — SARS CORONAVIRUS 2 BY RT PCR (HOSPITAL ORDER, PERFORMED IN ~~LOC~~ HOSPITAL LAB): SARS Coronavirus 2: POSITIVE — AB

## 2019-10-30 LAB — LACTATE DEHYDROGENASE: LDH: 131 U/L (ref 98–192)

## 2019-10-30 LAB — ABO/RH: ABO/RH(D): O POS

## 2019-10-30 LAB — TROPONIN I (HIGH SENSITIVITY): Troponin I (High Sensitivity): 646 ng/L (ref <18)

## 2019-10-30 LAB — FIBRINOGEN: Fibrinogen: 655 mg/dL — ABNORMAL HIGH (ref 210–475)

## 2019-10-30 LAB — D-DIMER, QUANTITATIVE: D-Dimer, Quant: 3.56 ug/mL-FEU — ABNORMAL HIGH (ref 0.00–0.50)

## 2019-10-30 LAB — PROCALCITONIN: Procalcitonin: 0.49 ng/mL

## 2019-10-30 LAB — HEPATITIS B SURFACE ANTIGEN: Hepatitis B Surface Ag: NONREACTIVE

## 2019-10-30 LAB — FERRITIN: Ferritin: 24 ng/mL (ref 11–307)

## 2019-10-30 LAB — C-REACTIVE PROTEIN: CRP: 4.6 mg/dL — ABNORMAL HIGH (ref <1.0)

## 2019-10-30 LAB — BRAIN NATRIURETIC PEPTIDE: B Natriuretic Peptide: 178.3 pg/mL — ABNORMAL HIGH (ref 0.0–100.0)

## 2019-10-30 MED ORDER — ONDANSETRON HCL 4 MG PO TABS: 4.0000 mg | ORAL_TABLET | Freq: Four times a day (QID) | ORAL | Status: DC | PRN

## 2019-10-30 MED ORDER — SODIUM CHLORIDE 0.9 % IV SOLN
1.0000 g | INTRAVENOUS | Status: AC
  Administered 2019-10-30 – 2019-11-03 (×5): 1 g via INTRAVENOUS
  Filled 2019-10-30 (×5): qty 10

## 2019-10-30 MED ORDER — GUAIFENESIN-DM 100-10 MG/5ML PO SYRP
10.0000 mL | ORAL_SOLUTION | ORAL | Status: DC | PRN
  Administered 2019-10-31 – 2019-11-01 (×2): 10 mL via ORAL
  Filled 2019-10-30 (×3): qty 10

## 2019-10-30 MED ORDER — SODIUM CHLORIDE 0.9 % IV SOLN
500.0000 mg | INTRAVENOUS | Status: AC
  Administered 2019-10-30 – 2019-11-02 (×4): 500 mg via INTRAVENOUS
  Filled 2019-10-30 (×4): qty 500

## 2019-10-30 MED ORDER — ONDANSETRON HCL 4 MG/2ML IJ SOLN
4.0000 mg | Freq: Four times a day (QID) | INTRAMUSCULAR | Status: DC | PRN
  Administered 2019-11-01 – 2019-11-04 (×2): 4 mg via INTRAVENOUS
  Filled 2019-10-30 (×2): qty 2

## 2019-10-30 MED ORDER — ASCORBIC ACID 500 MG PO TABS
500.0000 mg | ORAL_TABLET | Freq: Every day | ORAL | Status: DC
  Administered 2019-10-30 – 2019-11-07 (×9): 500 mg via ORAL
  Filled 2019-10-30 (×10): qty 1

## 2019-10-30 MED ORDER — ENOXAPARIN SODIUM 40 MG/0.4ML ~~LOC~~ SOLN: 40.0000 mg | SUBCUTANEOUS | Status: DC

## 2019-10-30 MED ORDER — SODIUM CHLORIDE 0.9 % IV BOLUS
500.0000 mL | Freq: Once | INTRAVENOUS | Status: AC
  Administered 2019-10-30: 500 mL via INTRAVENOUS

## 2019-10-30 MED ORDER — HYDROCOD POLST-CPM POLST ER 10-8 MG/5ML PO SUER: 5.0000 mL | Freq: Two times a day (BID) | ORAL | Status: DC | PRN

## 2019-10-30 MED ORDER — VANCOMYCIN HCL 1500 MG/300ML IV SOLN: 1500.0000 mg | INTRAVENOUS | Status: DC

## 2019-10-30 MED ORDER — PIPERACILLIN-TAZOBACTAM 3.375 G IVPB 30 MIN
3.3750 g | Freq: Once | INTRAVENOUS | Status: AC
  Administered 2019-10-30: 3.375 g via INTRAVENOUS
  Filled 2019-10-30: qty 50

## 2019-10-30 MED ORDER — IPRATROPIUM-ALBUTEROL 0.5-2.5 (3) MG/3ML IN SOLN: 3.0000 mL | RESPIRATORY_TRACT | Status: DC | PRN

## 2019-10-30 MED ORDER — ENOXAPARIN SODIUM 120 MG/0.8ML ~~LOC~~ SOLN
120.0000 mg | Freq: Two times a day (BID) | SUBCUTANEOUS | Status: DC
  Administered 2019-10-30 – 2019-10-31 (×2): 120 mg via SUBCUTANEOUS
  Filled 2019-10-30 (×2): qty 0.8

## 2019-10-30 MED ORDER — ACETAMINOPHEN 325 MG PO TABS
650.0000 mg | ORAL_TABLET | Freq: Once | ORAL | Status: AC
  Administered 2019-10-30: 650 mg via ORAL
  Filled 2019-10-30: qty 2

## 2019-10-30 MED ORDER — ACETAMINOPHEN 325 MG PO TABS
650.0000 mg | ORAL_TABLET | Freq: Four times a day (QID) | ORAL | Status: DC | PRN
  Administered 2019-11-06 – 2019-11-07 (×2): 650 mg via ORAL
  Filled 2019-10-30 (×2): qty 2

## 2019-10-30 MED ORDER — ZINC SULFATE 220 (50 ZN) MG PO CAPS
220.0000 mg | ORAL_CAPSULE | Freq: Every day | ORAL | Status: DC
  Administered 2019-10-30 – 2019-11-07 (×9): 220 mg via ORAL
  Filled 2019-10-30 (×9): qty 1

## 2019-10-30 MED ORDER — PIPERACILLIN-TAZOBACTAM 3.375 G IVPB: 3.3750 g | Freq: Three times a day (TID) | INTRAVENOUS | Status: DC

## 2019-10-30 MED ORDER — SODIUM CHLORIDE 0.9 % IV BOLUS
1000.0000 mL | Freq: Once | INTRAVENOUS | Status: AC
  Administered 2019-10-30: 1000 mL via INTRAVENOUS

## 2019-10-30 MED ORDER — LACTATED RINGERS IV BOLUS
500.0000 mL | Freq: Once | INTRAVENOUS | Status: AC
  Administered 2019-10-30: 500 mL via INTRAVENOUS

## 2019-10-30 MED ORDER — VANCOMYCIN HCL 10 G IV SOLR
2500.0000 mg | Freq: Once | INTRAVENOUS | Status: AC
  Administered 2019-10-30: 2500 mg via INTRAVENOUS
  Filled 2019-10-30: qty 2500

## 2019-10-30 MED ORDER — ALBUTEROL SULFATE HFA 108 (90 BASE) MCG/ACT IN AERS
2.0000 | INHALATION_SPRAY | Freq: Four times a day (QID) | RESPIRATORY_TRACT | Status: DC
  Administered 2019-10-30 – 2019-11-07 (×31): 2 via RESPIRATORY_TRACT
  Filled 2019-10-30: qty 6.7

## 2019-10-30 MED ORDER — DEXAMETHASONE 4 MG PO TABS
6.0000 mg | ORAL_TABLET | ORAL | Status: DC
  Administered 2019-10-30: 6 mg via ORAL
  Filled 2019-10-30: qty 2

## 2019-10-30 NOTE — Progress Notes (Signed)
ANTICOAGULATION CONSULT NOTE - Initial Consult  Pharmacy Consult for Enoxaparin Indication: pulmonary embolus (suspected)  Allergies  Allergen Reactions  . Codeine   . Hydrocodone     Patient Measurements:   Estimated weight: 120kg (08/2019 approx 114.9kg)  Vital Signs: Temp: 101.1 F (38.4 C) (07/26 1054) Temp Source: Rectal (07/26 1054) BP: 102/50 (07/26 1629) Pulse Rate: 70 (07/26 1649)  Labs: Recent Labs    10/30/19 1100 10/30/19 1530  HGB 8.1* 8.0*  HCT 29.4* 29.2*  PLT 182 177  LABPROT 14.1  --   INR 1.1  --   CREATININE 1.23* 1.42*  TROPONINIHS  --  646*    CrCl cannot be calculated (Unknown ideal weight.).   Medical History: Past Medical History:  Diagnosis Date  . CHF (congestive heart failure) ([place])   . COPD (chronic obstructive pulmonary disease) ([place])   . Diabetes mellitus without complication ([place])   . Hypertension   . Oxygen deficiency     Medications:  Scheduled:  . albuterol  2 puff Inhalation Q6H  . vitamin C  500 mg Oral Daily  . dexamethasone  6 mg Oral Q24H  . enoxaparin (LOVENOX) injection  40 mg Subcutaneous Q24H  . zinc sulfate  220 mg Oral Daily    Assessment: Patient is a 74yo female who was admitted for COVID & suspected PE. D-dimer elevated (3.56) & scans pending. Hg 8.1, Hct 29.4, Plt 182. Not taking anticoag PTA.  Goal of Therapy:  Anti-Xa level 0.6-1 units/ml 4hrs after LMWH dose given Monitor platelets by anticoagulation protocol: Yes   Plan:  Lovenox 120mg  subcutaneously Q12h If plan to keep on therapy long-term, consider monitoring anti-Xa levels Monitor daily CBC, renal function, & s/sx of bleeding  Beckey Rutter, PharmD Candidate 10/30/2019,5:41 PM

## 2019-10-30 NOTE — Progress Notes (Signed)
CRITICAL VALUE ALERT  Critical Value:  ABG PCO2 = 77.9  Date & Time Notied:  10/30/19 2047  Provider Notified: [place], MD  Orders Received/Actions taken: Will contact Rapid Response RN.

## 2019-10-30 NOTE — ED Notes (Signed)
Patient denies pain and is resting comfortably.  

## 2019-10-30 NOTE — ED Notes (Addendum)
Elevated Trop reported to Attending MD Verbal order for EKG per attending EKG completed

## 2019-10-30 NOTE — Progress Notes (Signed)
RT placed pt on BIPAP/NIV PCV on Servo I on the following settings of 12/6 BUR 15 and 40%. Pt tolerating well; Respiratory status is stable at this time w/no distress noted. RT will continue to monitor.

## 2019-10-30 NOTE — H&P (Addendum)
History and Physical    Cassandra Barnes KPT:[id number] DOB: [birth year] DOA: 10/30/2019  PCP: Cher Nakai, MD  Patient coming from: home  I have personally briefly reviewed patient's old medical records in [place]  Chief Complaint: Shortness of breath and confusion.  HPI: Cassandra Barnes is a 74 y.o. female with medical history significant of chronic hypoxemic respiratory failure secondary to underlying COPD-on 2 L of oxygen via nasal cannula, hypertension, diabetes, CHF, morbid obesity, gout, GERD, chronic pressure ulcers on heel, bedridden, Covid pneumonia presents to emergency department with worsening shortness of breath since 1 week and confusion started yesterday and her symptoms got worse this morning therefore family called EMS and brought patient to the emergency department for further evaluation and management.  History gathered from patient's daughter over the phone.  She tells me that patient has difficulty in breathing since couple of days for which she has been using her breathing treatment every 6 hours however her oxygen saturation was keep dropping.  She had fever of 102 this morning.  She tells me that patient has been confused as well.  She talks that does not make any sense.  Reports worsening leg swelling, pain.  She is bedridden.  She had COVID-19 infection in May 2021 for which she was admitted and received Decadron and remdesivir at [facility].  She refused to get Covid vaccine.  Reports dysuria and foul-smelling urine since 1 week.  No history of headache, blurry vision, head trauma, seizures, loss of consciousness, syncope, chest pain, cough, congestion, nausea, vomiting, abdominal pain, bowel changes.  She lives with her husband.  Her daughter takes care of all 4 and her medications.  No history of smoking, alcohol, listed drug use.  ED Course: Upon arrival to ED: Patient had fever of 101.1, blood pressure on lower side.  WBC: 14.2, requiring 3 L of oxygen via  nasal cannula, lactic acid: 2.3, CMP shows AKI.  POC COVID-19 positive.  UA, BC, BNP, troponin: Pending.  Chest x-ray shows multifocal pneumonia.  Patient received IV Vanco and Zosyn in ED.  [facility] hospitalist consulted for admission for sepsis secondary to Covid pneumonia.  Review of Systems: As per HPI otherwise negative.    Past Medical History:  Diagnosis Date  . CHF (congestive heart failure) ([place])   . COPD (chronic obstructive pulmonary disease) ([place])   . Diabetes mellitus without complication ([place])   . Hypertension   . Oxygen deficiency     Past Surgical History:  Procedure Laterality Date  . ABDOMINAL HYSTERECTOMY    . CHOLECYSTECTOMY    . IR ANGIOGRAM EXTREMITY BILATERAL  04/11/2019  . IR FEM POP ART PTA MOD SED  04/11/2019  . IR US GUIDE VASC ACCESS RIGHT  04/11/2019     reports that she quit smoking about 6 years ago. She has never used smokeless tobacco. She reports that she does not drink alcohol and does not use drugs.  Allergies  Allergen Reactions  . Codeine   . Hydrocodone     Family History  Problem Relation Age of Onset  . Diabetes Mother   . Heart disease Mother   . Heart disease Father     Prior to Admission medications   Medication Sig Start Date End Date Taking? Authorizing Provider  acetaminophen (TYLENOL) 325 MG tablet Take 650 mg by mouth every 6 (six) hours as needed.    Cher Nakai, MD  albuterol (PROVENTIL) (2.5 MG/3ML) 0.083% nebulizer solution Take by nebulization. 09/14/19   [provider]  allopurinol (ZYLOPRIM) 100 MG tablet Take 100 mg by mouth daily.    Cher Nakai, MD  amoxicillin-clavulanate (AUGMENTIN) 250-125 MG tablet Take 1 tablet by mouth 2 (two) times daily.    Cher Nakai, MD  aspirin 325 MG tablet Take 325 mg by mouth daily.    Cher Nakai, MD  aspirin EC 81 MG tablet Take 81 mg by mouth daily.    [provider]  clopidogrel (PLAVIX) 75 MG tablet Take 75 mg by mouth daily.    Cher Nakai, MD  colchicine 0.6 MG tablet   06/18/15   [provider]  docusate sodium (COLACE) 100 MG capsule Take 100 mg by mouth daily. Daughter reports taking OTC stool softener 100 mg QD per PCP instructions    Cher Nakai, MD  docusate sodium (COLACE) 250 MG capsule Take 250 mg by mouth daily.    [provider]  Febuxostat (ULORIC) 80 MG TABS  06/19/15   [provider]  ferrous sulfate 325 (65 FE) MG EC tablet Take 325 mg by mouth daily with breakfast.    Cher Nakai, MD  furosemide (LASIX) 80 MG tablet  05/27/15   [provider]  gabapentin (NEURONTIN) 600 MG tablet Take 600 mg by mouth 3 (three) times daily.    [provider]  insulin glargine (LANTUS) 100 UNIT/ML injection Inject 38 Units into the skin 2 (two) times daily. Daughter reports PCP prescribed Lantus pen, 38 U BID    Cher Nakai, MD  insulin regular (NOVOLIN R) 100 units/mL injection Inject 8 Units into the skin 3 (three) times daily before meals. Daughter reports taking TIC/ AC; uses sliding scale 0-8 U according to PCP instructions    Cher Nakai, MD  levofloxacin (LEVAQUIN) 500 MG tablet Take 500 mg by mouth daily. Take every day x 10 days- prescribed at time of ED [facility] at [facility] 03/26/2019    Cher Nakai, MD  meloxicam (MOBIC) 15 MG tablet Take 15 mg by mouth daily.    Cher Nakai, MD  metoprolol succinate (TOPROL-XL) 25 MG 24 hr tablet Take 25 mg by mouth daily.    Cher Nakai, MD  Multiple Vitamin (MULTIVITAMIN WITH MINERALS) TABS tablet Take 1 tablet by mouth daily.    Cher Nakai, MD  nystatin (MYCOSTATIN/NYSTOP) powder Apply topically 4 (four) times daily.    Cher Nakai, MD  omeprazole (PRILOSEC) 20 MG capsule Take 20 mg by mouth daily.    [provider]  ondansetron (ZOFRAN) 4 MG tablet Take 4 mg by mouth every 8 (eight) hours as needed for nausea or vomiting.    Cher Nakai, MD  pantoprazole (PROTONIX) 40 MG tablet Take 40 mg by mouth daily.    Cher Nakai, MD  promethazine (PHENERGAN) 25 MG tablet Take  25 mg by mouth every 6 (six) hours as needed for nausea or vomiting.    [provider]  torsemide (DEMADEX) 20 MG tablet Take 20 mg by mouth daily. Daughter reports taking (2) 20 mg (for total dose of 40 mg) po QD    Cher Nakai, MD    Physical Exam: Vitals:   10/30/19 1619 10/30/19 1629 10/30/19 1639 10/30/19 1649  BP:  (!) 102/50    Pulse: 69 70 69 70  Resp: 15 15 16 16   Temp:      TempSrc:      SpO2: 95% 95% 95% 95%    Constitutional: NAD, calm, comfortable, obese, on 3 L of oxygen via nasal cannula,  Eyes: PERRL, lids and conjunctivae normal ENMT: Mucous membranes are moist. Posterior pharynx clear of any exudate or lesions.Normal dentition.  Neck: normal, supple, no masses, no thyromegaly Respiratory: clear to auscultation bilaterally, no wheezing, no crackles. Normal respiratory effort. No accessory muscle use.  Cardiovascular: Regular rate and rhythm, no murmurs / rubs / gallops.  3+ bilateral pitting edema positive. 2+ pedal pulses. No carotid bruits.  Abdomen: no tenderness, no masses palpated. No hepatosplenomegaly. Bowel sounds positive.  Musculoskeletal: no clubbing / cyanosis. No joint deformity upper and lower extremities. Good ROM, no contractures. Normal muscle tone.  Skin: Dressing dry and intact on both heels  neurologic: CN 2-12 grossly intact. Sensation intact, DTR normal. Strength 5/5 in all 4.  Psychiatric: Normal judgment and insight. Alert and oriented x 3. Normal mood.    Labs on Admission: I have personally reviewed following labs and imaging studies  CBC: Recent Labs  Lab 10/30/19 1100 10/30/19 1530  WBC 14.2* 13.4*  NEUTROABS 11.9*  --   HGB 8.1* 8.0*  HCT 29.4* 29.2*  MCV 98.3 100.0  PLT 182 527   Basic Metabolic Panel: Recent Labs  Lab 10/30/19 1100  NA 136  K 4.6  CL 92*  CO2 37*  GLUCOSE 109*  BUN 16  CREATININE 1.23*  CALCIUM 9.2   GFR: CrCl cannot be calculated (Unknown ideal weight.). Liver Function Tests: Recent  Labs  Lab 10/30/19 1100  AST 26  ALT 17  ALKPHOS 64  BILITOT 0.5  PROT 5.9*  ALBUMIN 2.7*   No results for input(s): LIPASE, AMYLASE in the last 168 hours. No results for input(s): AMMONIA in the last 168 hours. Coagulation Profile: Recent Labs  Lab 10/30/19 1100  INR 1.1   Cardiac Enzymes: No results for input(s): CKTOTAL, CKMB, CKMBINDEX, TROPONINI in the last 168 hours. BNP (last 3 results) No results for input(s): PROBNP in the last 8760 hours. HbA1C: No results for input(s): HGBA1C in the last 72 hours. CBG: No results for input(s): GLUCAP in the last 168 hours. Lipid Profile: No results for input(s): CHOL, HDL, LDLCALC, TRIG, CHOLHDL, LDLDIRECT in the last 72 hours. Thyroid Function Tests: No results for input(s): TSH, T4TOTAL, FREET4, T3FREE, THYROIDAB in the last 72 hours. Anemia Panel: No results for input(s): VITAMINB12, FOLATE, FERRITIN, TIBC, IRON, RETICCTPCT in the last 72 hours. Urine analysis:    Component Value Date/Time   COLORURINE AMBER (A) 10/30/2019 1330   APPEARANCEUR CLOUDY (A) 10/30/2019 1330   LABSPEC 1.012 10/30/2019 1330   PHURINE 6.0 10/30/2019 1330   GLUCOSEU NEGATIVE 10/30/2019 1330   HGBUR SMALL (A) 10/30/2019 1330   BILIRUBINUR NEGATIVE 10/30/2019 1330   KETONESUR NEGATIVE 10/30/2019 1330   PROTEINUR NEGATIVE 10/30/2019 1330   NITRITE NEGATIVE 10/30/2019 1330   LEUKOCYTESUR LARGE (A) 10/30/2019 1330    Radiological Exams on Admission: DG Chest Port 1 View  Result Date: 10/30/2019 CLINICAL DATA:  Shortness of breath, fever EXAM: PORTABLE CHEST 1 VIEW COMPARISON:  08/12/2019 FINDINGS: Bilateral perihilar and lower lung opacities. No significant pleural effusion. No pneumothorax. Cardiomegaly. IMPRESSION: Bilateral perihilar lower lung opacities may reflect multifocal pneumonia or edema. Cardiomegaly. Electronically Signed   By: Macy Mis M.D.   On: 10/30/2019 12:10    EKG: Independently reviewed.  Sinus rhythm.  Right bundle  branch block.  No ST elevation or depression noted.   Assessment/Plan Principal Problem:   Sepsis due to COVID-19 [facility]) Active Problems:   Hypertension   AKI (acute kidney injury) ([place])   Diabetes mellitus without  complication ([facility])   CHF (congestive heart failure) ([facility])   Normocytic anemia   COPD (chronic obstructive pulmonary disease) ([facility])    Sepsis secondary to Covid pneumonia: -Patient presented with fever of 101.1, leukocytosis of 14.2, lactic acid: 2.3, AKI.  Chest x-ray shows multifocal pneumonia.  POC COVID-19 positive. -Patient currently requiring 3 L oxygen via nasal cannula. -Received IV Vanco and Zosyn in ED. -Admit patient on the stepdown unit for close monitoring.  On continuous pulse ox.  We will try to wean off of oxygen as tolerated. -Patient has limited mobility, obese, worsening bilateral leg swelling and increased requirement of oxygen-she is on high risk for pulmonary embolism/DVT we will get Doppler ultrasound of bilateral lower extremities and CT angio of chest -We will start patient on full dose Lovenox -Check procalcitonin, blood culture, inflammatory markers.  Repeat inflammatory markers tomorrow AM. -Start on Rocephin and azithromycin.  DuoNebs as needed. -Start on Decadron, antitussive, p.o. vitamins -Patient was told that if COVID-19 pneumonitis gets worse we might potentially use Actemra off label, she denies any known history of TB or hepatitis and understands the risk and benefits and wants to proceed with Actemra treatment if required.  Acute on chronic hypoxemic respiratory failure: -Secondary to above.  No wheezing noted on exam. -DuoNebs as needed.  Acute metabolic encephalopathy: Patient presented with confusion. -Multifactorial-secondary to sepsis/AKI/UTI -She is alert and oriented and following commands. -Consult PT/OT/SLP -We will keep her n.p.o. for now. -Neurochecks.  AKI: Received IV fluids in ED. -Avoid nephrotoxic medication.   Repeat BMP tomorrow a.m.  UTI: Patient reports dysuria and foul-smelling urine since 1 week.  UA is positive for leukocytes.  Continue Rocephin.  Follow urine culture.  Hypertension: Blood pressure is on lower side -Hold home BP meds-metoprolol, torsemide -Resume home BP meds once blood pressure is back to baseline.  Diabetes mellitus: Check A1c.  Continue home Lantus.  Start patient on sliding scale insulin.  Monitor blood sugar closely  GERD: Continue PPI  Diabetic neuropathy: Continue gabapentin  Bilateral pressure ulcers on heels: Dressing dry and intact. -We will consult wound care  Please note: Patient's troponin came back elevated at 646.  Patient denies ACS symptoms.  Will trend troponin.  Will get stat EKG and transthoracic echo. consulted cardiology and discussed the plan with Dr. Harrell Gave.  Unable to safely start patient's home medication as med reconciliation is pending by pharmacy.  DVT prophylaxis: FD Lovenox/SCD Code Status: Full code-confirmed with patient and her daughter Family Communication: None present at bedside.  Plan of care discussed with patient in length and she verbalized understanding and agreed with it.  I called patient's daughter and discussed plan of care and she verbalized understanding. Disposition Plan: Home in 2 to 3 days Consults called: None Admission status: Inpatient   Mckinley Jewel MD [facility]  If 7PM-7AM, please contact night-coverage [email or web address] Password [facility]  10/30/2019, 5:15 PM

## 2019-10-30 NOTE — Progress Notes (Signed)
Pharmacy Antibiotic Note  Cassandra Barnes is a 74 y.o. female admitted on 10/30/2019 with sepsis.  Pharmacy has been consulted for vancomycin/zosyn dosing. Tmax/24h 101.1, WBC 14.2. SCr 1.23 on admit, normalized CrCl~45-50.  Estimated weight in ER is similar to previously documented weight 6 months ago, per RN 270 lb.  Plan: Zosyn 3.375g IV (26min infusion) x1; then 3.375g IV q8h (4h infusion) Vancomycin 2500mg  IV x1; then Vancomycin 1500 mg IV Q 24 hrs Monitor clinical progress, c/s, renal function F/u de-escalation plan/LOT, vancomycin levels as indicated      Temp (24hrs), Avg:101.1 F (38.4 C), Min:101.1 F (38.4 C), Max:101.1 F (38.4 C)  No results for input(s): WBC, CREATININE, LATICACIDVEN, VANCOTROUGH, VANCOPEAK, VANCORANDOM, GENTTROUGH, GENTPEAK, GENTRANDOM, TOBRATROUGH, TOBRAPEAK, TOBRARND, AMIKACINPEAK, AMIKACINTROU, AMIKACIN in the last 168 hours.  CrCl cannot be calculated (Patient's most recent lab result is older than the maximum 21 days allowed.).    Allergies  Allergen Reactions   Codeine    Hydrocodone     Antimicrobials this admission: 7/26 vancomycin >>  7/26 zosyn >>   Dose adjustments this admission:   Microbiology results:   Arturo Morton, PharmD, BCPS Please check AMION for all [place] contact numbers Clinical Pharmacist 10/30/2019 11:15 AM

## 2019-10-30 NOTE — Progress Notes (Signed)
Cassandra Barnes is a 74 y.o. female patient admitted from ED awake, alert - oriented  X 4 - no acute distress noted.  VSS - Blood pressure (!) 98/54, pulse 70, temperature 98.7 F (37.1 C), temperature source Oral, resp. rate 18, SpO2 94 %.  IV in place, occlusive dsg intact without redness.  Orientation to room, and floor completed with information packet given to patient. Call light within reach, patient able to voice, and demonstrate understanding. Patient does have chronic pressure ulcers on both heels and her both legs are swollen with 2+ pitting edema. Her bottom is also red but blanchable.     Dene Gentry, RN 10/30/2019 7:51 PM

## 2019-10-30 NOTE — ED Notes (Signed)
Vanc Ordered Waiting for main pharmacy

## 2019-10-30 NOTE — Progress Notes (Addendum)
Called by RN shortly after patient arrived from the emergency room to [address] and it was reported patient was lethargic decreased blood pressure.  Patient was given 500 normal saline bolus by rapid response team.  Patient was admitted with sepsis secondary to COVID-19 with hypoxia.  Is on supplemental oxygen. Pressures remained below 90 systolically.  Ordered ABG and gave LR bolus. ABG shows PCO2 of 77.9.   Patient replaced on BiPAP and moved to the ICU for close monitoring. Needs a negative pressure room while on BiPAP secondary to COVID-19.  Was informed by rapid response RN that patient did not need PCCM consult to be moved to the ICU for BiPAP secondary to COVID-19

## 2019-10-30 NOTE — ED Provider Notes (Signed)
[facility] EMERGENCY DEPARTMENT Provider Note   CSN: [id number] Arrival date & time: 10/30/19  1035     History Chief Complaint  Patient presents with  . Shortness of Breath  . Fever    Cassandra Barnes is a 74 y.o. female w/ hx of COPD on 2L [place] baseline, DM2, HTN, CHF, presenting to the ED with shortness of breath and fevers.  Family reported some confusion for the past several days.  The patient reports some coughing and difficulty urinating.  EMS gave patient 1L NS en route to the hospital.  Per medical records and family report, the patient had COVID PNEUMONIA in May 2021 requiring hospitalization in [facility].  Per medical record review, the patient has had hospitalizaiton in Sept 2020 for AMS/sepsis/hypoxic respiratory faiure due to PNA and UTI in the past.  At that time she required vasopressor support and was intubated in the ICU, where she developed oliguric AKI requiring CRRT for electrolyte and volume management.  She was discharged to SNF and is not longer on dialysis.  Today she presents from home.  HPI     Past Medical History:  Diagnosis Date  . CHF (congestive heart failure) ([place])   . COPD (chronic obstructive pulmonary disease) ([place])   . Diabetes mellitus without complication ([place])   . Hypertension   . Oxygen deficiency     Patient Active Problem List   Diagnosis Date Noted  . Hypertension   . Sepsis due to COVID-19 [facility])   . AKI (acute kidney injury) ([place])   . Diabetes mellitus without complication ([place])   . CHF (congestive heart failure) ([place])   . Normocytic anemia   . Acute on chronic respiratory failure with hypercapnia ([place])   . COPD (chronic obstructive pulmonary disease) ([place])     Past Surgical History:  Procedure Laterality Date  . ABDOMINAL HYSTERECTOMY    . CHOLECYSTECTOMY    . IR ANGIOGRAM EXTREMITY BILATERAL  04/11/2019  . IR FEM POP ART PTA MOD SED  04/11/2019  . IR US GUIDE VASC ACCESS RIGHT  04/11/2019     OB  History   No obstetric history on file.     Family History  Problem Relation Age of Onset  . Diabetes Mother   . Heart disease Mother   . Heart disease Father     Social History   Tobacco Use  . Smoking status: Former Smoker    Quit date: 04/10/2013    Years since quitting: 6.5  . Smokeless tobacco: Never Used  Vaping Use  . Vaping Use: Never used  Substance Use Topics  . Alcohol use: Never  . Drug use: Never    Home Medications Prior to Admission medications   Medication Sig Start Date End Date Taking? Authorizing Provider  acetaminophen (TYLENOL) 325 MG tablet Take 650 mg by mouth every 6 (six) hours as needed.    Cher Nakai, MD  albuterol (PROVENTIL) (2.5 MG/3ML) 0.083% nebulizer solution Take by nebulization. 09/14/19   [provider]  allopurinol (ZYLOPRIM) 100 MG tablet Take 100 mg by mouth daily.    Cher Nakai, MD  amoxicillin-clavulanate (AUGMENTIN) 250-125 MG tablet Take 1 tablet by mouth 2 (two) times daily.    Cher Nakai, MD  aspirin 325 MG tablet Take 325 mg by mouth daily.    Cher Nakai, MD  aspirin EC 81 MG tablet Take 81 mg by mouth daily.    [provider]  clopidogrel (PLAVIX) 75 MG tablet Take 75  mg by mouth daily.    Cher Nakai, MD  colchicine 0.6 MG tablet  06/18/15   [provider]  docusate sodium (COLACE) 100 MG capsule Take 100 mg by mouth daily. Daughter reports taking OTC stool softener 100 mg QD per PCP instructions    Cher Nakai, MD  docusate sodium (COLACE) 250 MG capsule Take 250 mg by mouth daily.    [provider]  Febuxostat (ULORIC) 80 MG TABS  06/19/15   [provider]  ferrous sulfate 325 (65 FE) MG EC tablet Take 325 mg by mouth daily with breakfast.    Cher Nakai, MD  furosemide (LASIX) 80 MG tablet  05/27/15   [provider]  gabapentin (NEURONTIN) 600 MG tablet Take 600 mg by mouth 3 (three) times daily.    [provider]  insulin glargine (LANTUS) 100 UNIT/ML injection  Inject 38 Units into the skin 2 (two) times daily. Daughter reports PCP prescribed Lantus pen, 38 U BID    Cher Nakai, MD  insulin regular (NOVOLIN R) 100 units/mL injection Inject 8 Units into the skin 3 (three) times daily before meals. Daughter reports taking TIC/ AC; uses sliding scale 0-8 U according to PCP instructions    Cher Nakai, MD  levofloxacin (LEVAQUIN) 500 MG tablet Take 500 mg by mouth daily. Take every day x 10 days- prescribed at time of ED visit at [facility] 03/26/2019    Cher Nakai, MD  meloxicam (MOBIC) 15 MG tablet Take 15 mg by mouth daily.    Cher Nakai, MD  metoprolol succinate (TOPROL-XL) 25 MG 24 hr tablet Take 25 mg by mouth daily.    Cher Nakai, MD  Multiple Vitamin (MULTIVITAMIN WITH MINERALS) TABS tablet Take 1 tablet by mouth daily.    Cher Nakai, MD  nystatin (MYCOSTATIN/NYSTOP) powder Apply topically 4 (four) times daily.    Cher Nakai, MD  omeprazole (PRILOSEC) 20 MG capsule Take 20 mg by mouth daily.    [provider]  ondansetron (ZOFRAN) 4 MG tablet Take 4 mg by mouth every 8 (eight) hours as needed for nausea or vomiting.    Cher Nakai, MD  pantoprazole (PROTONIX) 40 MG tablet Take 40 mg by mouth daily.    Cher Nakai, MD  promethazine (PHENERGAN) 25 MG tablet Take 25 mg by mouth every 6 (six) hours as needed for nausea or vomiting.    [provider]  torsemide (DEMADEX) 20 MG tablet Take 20 mg by mouth daily. Daughter reports taking (2) 20 mg (for total dose of 40 mg) po QD    Cher Nakai, MD    Allergies    Codeine and Hydrocodone  Review of Systems   Review of Systems  Constitutional: Negative for chills and fever.  Eyes: Negative for pain and visual disturbance.  Respiratory: Positive for shortness of breath. Negative for cough.   Cardiovascular: Negative for chest pain and palpitations.  Gastrointestinal: Negative for abdominal pain and vomiting.  Genitourinary: Positive for difficulty urinating. Negative for hematuria.    Musculoskeletal: Negative for arthralgias and back pain.  Skin: Negative for color change and rash.  Neurological: Positive for light-headedness. Negative for syncope.  Psychiatric/Behavioral: Negative for agitation and confusion.  All other systems reviewed and are negative.   Physical Exam Updated Vital Signs BP (!) 102/50   Pulse 70   Temp (!) 101.1 F (38.4 C) (Rectal)   Resp 16   SpO2 95%   Physical Exam Vitals and nursing note reviewed.  Constitutional:  Appearance: She is well-developed. She is obese.  HENT:     Head: Normocephalic and atraumatic.  Eyes:     Conjunctiva/sclera: Conjunctivae normal.  Cardiovascular:     Rate and Rhythm: Normal rate and regular rhythm.     Pulses: Normal pulses.  Pulmonary:     Effort: Pulmonary effort is normal. No respiratory distress.     Breath sounds: Rhonchi present.     Comments: 2l [place] maintaining 95% O2 saturation Abdominal:     Palpations: Abdomen is soft.     Tenderness: There is no abdominal tenderness.  Musculoskeletal:     Cervical back: Neck supple.  Skin:    General: Skin is warm and dry.  Neurological:     General: No focal deficit present.     Mental Status: She is alert and oriented to person, place, and time.     ED Results / Procedures / Treatments   Labs (all labs ordered are listed, but only abnormal results are displayed) Labs Reviewed  SARS CORONAVIRUS 2 BY RT PCR ([place], [place] [facility]) - Abnormal; Notable for the following components:      Result Value   SARS Coronavirus 2 POSITIVE (*)    All other components within normal limits  COMPREHENSIVE METABOLIC PANEL - Abnormal; Notable for the following components:   Chloride 92 (*)    CO2 37 (*)    Glucose, Bld 109 (*)    Creatinine, Ser 1.23 (*)    Total Protein 5.9 (*)    Albumin 2.7 (*)    GFR calc non Af Amer 44 (*)    GFR calc Af Amer 51 (*)    All other components within normal limits  LACTIC ACID,  PLASMA - Abnormal; Notable for the following components:   Lactic Acid, Venous 2.2 (*)    All other components within normal limits  LACTIC ACID, PLASMA - Abnormal; Notable for the following components:   Lactic Acid, Venous 2.3 (*)    All other components within normal limits  CBC WITH DIFFERENTIAL/PLATELET - Abnormal; Notable for the following components:   WBC 14.2 (*)    RBC 2.99 (*)    Hemoglobin 8.1 (*)    HCT 29.4 (*)    MCHC 27.6 (*)    Neutro Abs 11.9 (*)    Abs Immature Granulocytes 0.15 (*)    All other components within normal limits  URINALYSIS, ROUTINE W REFLEX MICROSCOPIC - Abnormal; Notable for the following components:   Color, Urine AMBER (*)    APPearance CLOUDY (*)    Hgb urine dipstick SMALL (*)    Leukocytes,Ua LARGE (*)    WBC, UA >50 (*)    Bacteria, UA MANY (*)    All other components within normal limits  BRAIN NATRIURETIC PEPTIDE - Abnormal; Notable for the following components:   B Natriuretic Peptide 178.3 (*)    All other components within normal limits  CBC - Abnormal; Notable for the following components:   WBC 13.4 (*)    RBC 2.92 (*)    Hemoglobin 8.0 (*)    HCT 29.2 (*)    MCHC 27.4 (*)    All other components within normal limits  D-DIMER, QUANTITATIVE (NOT AT [facility]) - Abnormal; Notable for the following components:   D-Dimer, Quant 3.56 (*)    All other components within normal limits  FIBRINOGEN - Abnormal; Notable for the following components:   Fibrinogen 655 (*)    All other components within normal  limits  LACTIC ACID, PLASMA - Abnormal; Notable for the following components:   Lactic Acid, Venous 2.0 (*)    All other components within normal limits  CULTURE, BLOOD (ROUTINE X 2)  CULTURE, BLOOD (ROUTINE X 2)  PROTIME-INR  PROCALCITONIN  CREATININE, SERUM  C-REACTIVE PROTEIN  FERRITIN  HEPATITIS B SURFACE ANTIGEN  LACTATE DEHYDROGENASE  LACTIC ACID, PLASMA  CBC WITH DIFFERENTIAL/PLATELET  COMPREHENSIVE METABOLIC PANEL    C-REACTIVE PROTEIN  D-DIMER, QUANTITATIVE (NOT AT [facility])  MAGNESIUM  FERRITIN  PHOSPHORUS  ABO/RH  TROPONIN I (HIGH SENSITIVITY)  TROPONIN I (HIGH SENSITIVITY)  TROPONIN I (HIGH SENSITIVITY)    EKG None  Radiology DG Chest Port 1 View  Result Date: 10/30/2019 CLINICAL DATA:  Shortness of breath, fever EXAM: PORTABLE CHEST 1 VIEW COMPARISON:  08/12/2019 FINDINGS: Bilateral perihilar and lower lung opacities. No significant pleural effusion. No pneumothorax. Cardiomegaly. IMPRESSION: Bilateral perihilar lower lung opacities may reflect multifocal pneumonia or edema. Cardiomegaly. Electronically Signed   By: Macy Mis M.D.   On: 10/30/2019 12:10    Procedures .Critical Care Performed by: Wyvonnia Dusky, MD Authorized by: Wyvonnia Dusky, MD   Critical care provider statement:    Critical care time (minutes):  45   Critical care was necessary to treat or prevent imminent or life-threatening deterioration of the following conditions:  Sepsis   Critical care was time spent personally by me on the following activities:  Discussions with consultants, evaluation of patient's response to treatment, examination of patient, ordering and performing treatments and interventions, ordering and review of laboratory studies, ordering and review of radiographic studies, pulse oximetry, re-evaluation of patient's condition, obtaining history from patient or surrogate and review of old charts   (including critical care time)  Medications Ordered in ED Medications  enoxaparin (LOVENOX) injection 40 mg (has no administration in time range)  albuterol (VENTOLIN HFA) 108 (90 Base) MCG/ACT inhaler 2 puff (has no administration in time range)  guaiFENesin-dextromethorphan (ROBITUSSIN DM) 100-10 MG/5ML syrup 10 mL (has no administration in time range)  chlorpheniramine-HYDROcodone (TUSSIONEX) 10-8 MG/5ML suspension 5 mL (has no administration in time range)  ascorbic acid (VITAMIN C) tablet 500  mg (500 mg Oral Given 10/30/19 1704)  zinc sulfate capsule 220 mg (220 mg Oral Given 10/30/19 1705)  ondansetron (ZOFRAN) tablet 4 mg (has no administration in time range)    Or  ondansetron (ZOFRAN) injection 4 mg (has no administration in time range)  acetaminophen (TYLENOL) tablet 650 mg (has no administration in time range)  dexamethasone (DECADRON) tablet 6 mg (6 mg Oral Given 10/30/19 1704)  cefTRIAXone (ROCEPHIN) 1 g in sodium chloride 0.9 % 100 mL IVPB (1 g Intravenous New Bag/Given 10/30/19 1706)  azithromycin (ZITHROMAX) 500 mg in sodium chloride 0.9 % 250 mL IVPB (has no administration in time range)  sodium chloride 0.9 % bolus 1,000 mL (0 mLs Intravenous Stopped 10/30/19 1652)  piperacillin-tazobactam (ZOSYN) IVPB 3.375 g (0 g Intravenous Stopped 10/30/19 1516)  acetaminophen (TYLENOL) tablet 650 mg (650 mg Oral Given 10/30/19 1135)  vancomycin (VANCOCIN) 2,500 mg in sodium chloride 0.9 % 500 mL IVPB (0 mg Intravenous Stopped 10/30/19 1648)    ED Course  I have reviewed the triage vital signs and the nursing notes.  Pertinent labs & imaging results that were available during my care of the patient were reviewed by me and considered in my medical decision making (see chart for details).  74 year old female with a history of COPD on 2 L nasal cannula presenting emergency  department with confusion, fever, hypoxia and dysuria.  Appears to be very similar presentation to her hospitalization in September at an outside hospital, as noted above, for which she was treated for septic shock and significant AKi requiring CRRT and intubation in the ICU.  Today, thankfully, she appears much more stable on arrival.  There is no evidence of shock.  I did initiate a sepsis work-up.  My review of her chest x-ray, it does appear that she may have some pulmonary edema or multifocal pneumonia noted in the lower lobes of the lung.  This may be a source.  We will also need a UA, will obtain a cath sample if she  cannot urinate.  Initiated broad-spectrum antibiotic with IV vancomycin and IV zosyn.  Only 1L IVF (in addition to the 1L given by EMS) due to her CHF history and unclear renal status , to prevent volume overload.  Labs reviewed today with lactate 2.2, WBC 14.2, Cr 1.23 Pending COVID, remainder of labs, anticipate admission to med tele bed for sepsis management. Unable to reach her husband by phone from the ED   Clinical Course as of Oct 30 1707  Mon Oct 30, 2019  1102 Sepsis w/u initiated, unclear source, BS antibx, 1L IVF for now as her CHF history is not clear   [MT]  1308 ECG per my interpretation shows sinus rhythm with RBBB, no acute ischemic findings.   [MT]  7680 Unable to reach spouse Rezel Garder by phone   [MT]  1348 SARS Coronavirus 2(!): POSITIVE [MT]  35 Reached daughter patsy by phone and updated.  She confirms her mother had COVID back in may during hospitalization in [place].   [MT]  1427 Admitted to Dr Doristine Bosworth hospitalist   [MT]    Clinical Course User Index [MT] Langston Masker Carola Rhine, MD    Final Clinical Impression(s) / ED Diagnoses Final diagnoses:  Sepsis [facility])    Rx / DC Orders ED Discharge Orders    None       Wyvonnia Dusky, MD 10/30/19 1710

## 2019-10-30 NOTE — ED Notes (Signed)
PT's O2 sat is btw 86-88% on 2lit [place]  Raised O2 canula to 3lit  Will continue to monitor

## 2019-10-30 NOTE — Significant Event (Addendum)
Rapid Response Event Note  Overview: Called d/t lethargy and hypotension. Per RN, pt arrived from ED awake, alert, and oriented. Pt is now lethargic, SBP-80s. 500cc NS bolus ordered by MD and is currently infusing, SBP now 90s.   Initial Focused Assessment: Pt laying in bed with eyes closed, in no distress. Pt will arouse only to continuous sternal rub. Pt is alert to person, place, and situation, moves all extremities, and follows commands. This is only while being continuously stimulated. Pt has a dysconjugate gaze, Pupils 4 and sluggish. Lungs diminished t/o. Skin warm and dry. T-98.1, BP-98/38, HR-71, RR-15, SpO2-93% on 2L [place], CBG-137.  Interventions: 500cc NS bolus(ordered PTA RRT) Plan of Care (if not transferred): Pt is very lethargic. Finish bolus and reassess. If mental status unchanged, ABG may be helpful. Plan of care discussed with Milbert Coulter RN. Please call RRT if further assistance needed.   1930-Mental status unchanged-Additional 500cc NS bolus ordered by MD 2006-Mental status unchanged-Additional 500cc LR bolus and ABG ordered by MD.  ABG:7.27/77.9/72.5/35-MD notified-will move to 109M for bipap as pt is COVID positive and must be in 109M for this intervention.  Event Summary:  Called:1910 Arrived: 1915 Ended: 2215  [place]

## 2019-10-30 NOTE — ED Notes (Addendum)
Removed from [place] on 2lit nasal canula per EDP Will continue to monitor.

## 2019-10-30 NOTE — Progress Notes (Signed)
Per MD note through secure chat - continue sepsis protocol.  Per MD, pt had 1L bolus w/ EMS and 1L here per [facility], and would not give more d/t hx of CHF. Order requested and received for 3rd LA since repeat went up.  Will continue to monitor sepsis protocol.

## 2019-10-30 NOTE — ED Notes (Signed)
Patsy, Daughter would like an update [phone]

## 2019-10-30 NOTE — ED Notes (Signed)
Did in and out cath to get urine sample patient is resting with call bell in reach

## 2019-10-30 NOTE — ED Triage Notes (Signed)
Pt is here with sob, fever, decreased urine output, and altered mental status.  Pt is home O2 dependent.  02/2L was 75% on arrival by EMS and now patient is on 100% NRB.  Pt is usually A&O x4 and now she is alert to self and place now.  CBG 133.  BP 100/36.  History of CHF and COPD.  Temp was 102.9 per EMS.  Pt has received 1L of NS en route to ED

## 2019-10-30 NOTE — Progress Notes (Signed)
Went to check on the patient. She appeared to be sleep when entering the room. When trying to talk to her she would not respond to voice, I then tapped her, she still did not respond. I sternal rubbed her and she aroused a little and opened her eyes. She was able to tell me she was at [facility] cone but would not answer any other questions. The charge nurse was called to take a look at the patient and rapid was called as well.   The patient was awake and alert upon arrival to the unit but has now become lethargic.

## 2019-10-30 NOTE — ED Notes (Signed)
Per EMS- From home  Family says she is normally alert but not engaging Difficulty urinating in the last few days 1Lit NS from EMS

## 2019-10-31 ENCOUNTER — Inpatient Hospital Stay (HOSPITAL_COMMUNITY): Payer: Medicare Other

## 2019-10-31 DIAGNOSIS — R609 Edema, unspecified: Secondary | ICD-10-CM

## 2019-10-31 DIAGNOSIS — M7989 Other specified soft tissue disorders: Secondary | ICD-10-CM

## 2019-10-31 DIAGNOSIS — J449 Chronic obstructive pulmonary disease, unspecified: Secondary | ICD-10-CM

## 2019-10-31 DIAGNOSIS — R0602 Shortness of breath: Secondary | ICD-10-CM

## 2019-10-31 DIAGNOSIS — I10 Essential (primary) hypertension: Secondary | ICD-10-CM

## 2019-10-31 DIAGNOSIS — N179 Acute kidney failure, unspecified: Secondary | ICD-10-CM

## 2019-10-31 LAB — COMPREHENSIVE METABOLIC PANEL
ALT: 17 U/L (ref 0–44)
AST: 27 U/L (ref 15–41)
Albumin: 2.7 g/dL — ABNORMAL LOW (ref 3.5–5.0)
Alkaline Phosphatase: 61 U/L (ref 38–126)
Anion gap: 13 (ref 5–15)
BUN: 26 mg/dL — ABNORMAL HIGH (ref 8–23)
CO2: 27 mmol/L (ref 22–32)
Calcium: 8.7 mg/dL — ABNORMAL LOW (ref 8.9–10.3)
Chloride: 97 mmol/L — ABNORMAL LOW (ref 98–111)
Creatinine, Ser: 1.27 mg/dL — ABNORMAL HIGH (ref 0.44–1.00)
GFR calc Af Amer: 49 mL/min — ABNORMAL LOW (ref >60)
GFR calc non Af Amer: 42 mL/min — ABNORMAL LOW (ref >60)
Glucose, Bld: 266 mg/dL — ABNORMAL HIGH (ref 70–99)
Potassium: 5.8 mmol/L — ABNORMAL HIGH (ref 3.5–5.1)
Sodium: 137 mmol/L (ref 135–145)
Total Bilirubin: 0.5 mg/dL (ref 0.3–1.2)
Total Protein: 6.2 g/dL — ABNORMAL LOW (ref 6.5–8.1)

## 2019-10-31 LAB — HEMOGLOBIN A1C
Hgb A1c MFr Bld: 6.5 % — ABNORMAL HIGH (ref 4.8–5.6)
Mean Plasma Glucose: 139.85 mg/dL

## 2019-10-31 LAB — ECHOCARDIOGRAM COMPLETE
Area-P 1/2: 3.65 cm2
S' Lateral: 3.58 cm

## 2019-10-31 LAB — POCT I-STAT 7, (LYTES, BLD GAS, ICA,H+H)
Acid-Base Excess: 11 mmol/L — ABNORMAL HIGH (ref 0.0–2.0)
Bicarbonate: 38.7 mmol/L — ABNORMAL HIGH (ref 20.0–28.0)
Calcium, Ion: 1.18 mmol/L (ref 1.15–1.40)
HCT: 27 % — ABNORMAL LOW (ref 36.0–46.0)
Hemoglobin: 9.2 g/dL — ABNORMAL LOW (ref 12.0–15.0)
O2 Saturation: 96 %
Patient temperature: 98.1
Potassium: 5.4 mmol/L — ABNORMAL HIGH (ref 3.5–5.1)
Sodium: 134 mmol/L — ABNORMAL LOW (ref 135–145)
TCO2: 41 mmol/L — ABNORMAL HIGH (ref 22–32)
pCO2 arterial: 74.9 mmHg (ref 32.0–48.0)
pH, Arterial: 7.32 — ABNORMAL LOW (ref 7.350–7.450)
pO2, Arterial: 96 mmHg (ref 83.0–108.0)

## 2019-10-31 LAB — CBC WITH DIFFERENTIAL/PLATELET
Abs Immature Granulocytes: 0.16 10*3/uL — ABNORMAL HIGH (ref 0.00–0.07)
Basophils Absolute: 0 10*3/uL (ref 0.0–0.1)
Basophils Relative: 0 %
Eosinophils Absolute: 0 10*3/uL (ref 0.0–0.5)
Eosinophils Relative: 0 %
HCT: 33 % — ABNORMAL LOW (ref 36.0–46.0)
Hemoglobin: 9.2 g/dL — ABNORMAL LOW (ref 12.0–15.0)
Immature Granulocytes: 2 %
Lymphocytes Relative: 13 %
Lymphs Abs: 1.4 10*3/uL (ref 0.7–4.0)
MCH: 27.9 pg (ref 26.0–34.0)
MCHC: 27.9 g/dL — ABNORMAL LOW (ref 30.0–36.0)
MCV: 100 fL (ref 80.0–100.0)
Monocytes Absolute: 0.2 10*3/uL (ref 0.1–1.0)
Monocytes Relative: 2 %
Neutro Abs: 8.9 10*3/uL — ABNORMAL HIGH (ref 1.7–7.7)
Neutrophils Relative %: 83 %
Platelets: 172 10*3/uL (ref 150–400)
RBC: 3.3 MIL/uL — ABNORMAL LOW (ref 3.87–5.11)
RDW: 14.7 % (ref 11.5–15.5)
WBC: 10.7 10*3/uL — ABNORMAL HIGH (ref 4.0–10.5)
nRBC: 0 % (ref 0.0–0.2)

## 2019-10-31 LAB — GLUCOSE, CAPILLARY
Glucose-Capillary: 268 mg/dL — ABNORMAL HIGH (ref 70–99)
Glucose-Capillary: 263 mg/dL — ABNORMAL HIGH (ref 70–99)
Glucose-Capillary: 296 mg/dL — ABNORMAL HIGH (ref 70–99)
Glucose-Capillary: 293 mg/dL — ABNORMAL HIGH (ref 70–99)
Glucose-Capillary: 215 mg/dL — ABNORMAL HIGH (ref 70–99)

## 2019-10-31 LAB — C-REACTIVE PROTEIN: CRP: 10.2 mg/dL — ABNORMAL HIGH (ref <1.0)

## 2019-10-31 LAB — FERRITIN: Ferritin: 41 ng/mL (ref 11–307)

## 2019-10-31 LAB — PHOSPHORUS: Phosphorus: 6 mg/dL — ABNORMAL HIGH (ref 2.5–4.6)

## 2019-10-31 LAB — D-DIMER, QUANTITATIVE: D-Dimer, Quant: 1.56 ug/mL-FEU — ABNORMAL HIGH (ref 0.00–0.50)

## 2019-10-31 LAB — MAGNESIUM: Magnesium: 1.9 mg/dL (ref 1.7–2.4)

## 2019-10-31 LAB — LACTIC ACID, PLASMA: Lactic Acid, Venous: 2.2 mmol/L (ref 0.5–1.9)

## 2019-10-31 MED ORDER — COLLAGENASE 250 UNIT/GM EX OINT
TOPICAL_OINTMENT | Freq: Every day | CUTANEOUS | Status: DC
  Filled 2019-10-31 (×2): qty 30

## 2019-10-31 MED ORDER — FUROSEMIDE 10 MG/ML IJ SOLN
40.0000 mg | Freq: Once | INTRAMUSCULAR | Status: AC
  Administered 2019-10-31: 40 mg via INTRAVENOUS
  Filled 2019-10-31: qty 4

## 2019-10-31 MED ORDER — ASPIRIN EC 81 MG PO TBEC
81.0000 mg | DELAYED_RELEASE_TABLET | Freq: Every day | ORAL | Status: DC
  Administered 2019-10-31 – 2019-11-07 (×8): 81 mg via ORAL
  Filled 2019-10-31 (×8): qty 1

## 2019-10-31 MED ORDER — SODIUM ZIRCONIUM CYCLOSILICATE 10 G PO PACK
10.0000 g | PACK | Freq: Every day | ORAL | Status: DC
  Administered 2019-10-31 – 2019-11-01 (×2): 10 g via ORAL
  Filled 2019-10-31 (×2): qty 1

## 2019-10-31 MED ORDER — SODIUM CHLORIDE 0.9 % IV SOLN
200.0000 mg | Freq: Once | INTRAVENOUS | Status: AC
  Administered 2019-10-31: 200 mg via INTRAVENOUS
  Filled 2019-10-31: qty 40

## 2019-10-31 MED ORDER — INSULIN ASPART 100 UNIT/ML ~~LOC~~ SOLN
0.0000 [IU] | Freq: Every day | SUBCUTANEOUS | Status: DC
  Administered 2019-10-31 – 2019-11-02 (×3): 2 [IU] via SUBCUTANEOUS
  Administered 2019-11-03: 5 [IU] via SUBCUTANEOUS

## 2019-10-31 MED ORDER — MIDODRINE HCL 5 MG PO TABS
10.0000 mg | ORAL_TABLET | Freq: Three times a day (TID) | ORAL | Status: DC
  Administered 2019-10-31 – 2019-11-02 (×7): 10 mg via ORAL
  Filled 2019-10-31 (×7): qty 2

## 2019-10-31 MED ORDER — IOHEXOL 350 MG/ML SOLN
60.0000 mL | Freq: Once | INTRAVENOUS | Status: AC | PRN
  Administered 2019-10-31: 60 mL via INTRAVENOUS

## 2019-10-31 MED ORDER — CHLORHEXIDINE GLUCONATE CLOTH 2 % EX PADS
6.0000 | MEDICATED_PAD | Freq: Every day | CUTANEOUS | Status: DC
  Administered 2019-10-31 – 2019-11-05 (×3): 6 via TOPICAL

## 2019-10-31 MED ORDER — COLCHICINE 0.6 MG PO TABS
0.6000 mg | ORAL_TABLET | Freq: Every day | ORAL | Status: DC
  Administered 2019-10-31 – 2019-11-07 (×8): 0.6 mg via ORAL
  Filled 2019-10-31 (×8): qty 1

## 2019-10-31 MED ORDER — METHYLPREDNISOLONE SODIUM SUCC 40 MG IJ SOLR
40.0000 mg | Freq: Two times a day (BID) | INTRAMUSCULAR | Status: DC
  Administered 2019-10-31 – 2019-11-02 (×5): 40 mg via INTRAVENOUS
  Filled 2019-10-31 (×5): qty 1

## 2019-10-31 MED ORDER — PANTOPRAZOLE SODIUM 40 MG PO TBEC
40.0000 mg | DELAYED_RELEASE_TABLET | Freq: Two times a day (BID) | ORAL | Status: DC
  Administered 2019-10-31 – 2019-11-07 (×16): 40 mg via ORAL
  Filled 2019-10-31 (×16): qty 1

## 2019-10-31 MED ORDER — INSULIN ASPART 100 UNIT/ML ~~LOC~~ SOLN
8.0000 [IU] | Freq: Three times a day (TID) | SUBCUTANEOUS | Status: DC
  Administered 2019-10-31 – 2019-11-01 (×4): 8 [IU] via SUBCUTANEOUS

## 2019-10-31 MED ORDER — TIOTROPIUM BROMIDE MONOHYDRATE 18 MCG IN CAPS
18.0000 ug | ORAL_CAPSULE | Freq: Every day | RESPIRATORY_TRACT | Status: DC
  Filled 2019-10-31: qty 5

## 2019-10-31 MED ORDER — ENOXAPARIN SODIUM 40 MG/0.4ML ~~LOC~~ SOLN
40.0000 mg | SUBCUTANEOUS | Status: DC
  Administered 2019-11-01 – 2019-11-04 (×4): 40 mg via SUBCUTANEOUS
  Filled 2019-10-31 (×4): qty 0.4

## 2019-10-31 MED ORDER — INSULIN ASPART 100 UNIT/ML ~~LOC~~ SOLN
0.0000 [IU] | Freq: Three times a day (TID) | SUBCUTANEOUS | Status: DC
  Administered 2019-10-31 (×2): 11 [IU] via SUBCUTANEOUS
  Administered 2019-11-01 – 2019-11-02 (×4): 7 [IU] via SUBCUTANEOUS
  Administered 2019-11-02: 4 [IU] via SUBCUTANEOUS
  Administered 2019-11-02: 7 [IU] via SUBCUTANEOUS
  Administered 2019-11-03: 4 [IU] via SUBCUTANEOUS
  Administered 2019-11-03: 7 [IU] via SUBCUTANEOUS
  Administered 2019-11-03: 11 [IU] via SUBCUTANEOUS
  Administered 2019-11-04: 7 [IU] via SUBCUTANEOUS
  Administered 2019-11-04: 3 [IU] via SUBCUTANEOUS
  Administered 2019-11-04: 7 [IU] via SUBCUTANEOUS
  Administered 2019-11-05 (×2): 4 [IU] via SUBCUTANEOUS
  Administered 2019-11-06: 7 [IU] via SUBCUTANEOUS
  Administered 2019-11-06: 3 [IU] via SUBCUTANEOUS
  Administered 2019-11-07 (×2): 4 [IU] via SUBCUTANEOUS

## 2019-10-31 MED ORDER — FERROUS SULFATE 325 (65 FE) MG PO TABS
325.0000 mg | ORAL_TABLET | Freq: Every day | ORAL | Status: DC
  Administered 2019-11-01 – 2019-11-07 (×7): 325 mg via ORAL
  Filled 2019-10-31 (×8): qty 1

## 2019-10-31 MED ORDER — INSULIN GLARGINE 100 UNIT/ML ~~LOC~~ SOLN
45.0000 [IU] | Freq: Two times a day (BID) | SUBCUTANEOUS | Status: DC
  Administered 2019-10-31 – 2019-11-01 (×3): 45 [IU] via SUBCUTANEOUS
  Filled 2019-10-31 (×5): qty 0.45

## 2019-10-31 MED ORDER — SODIUM CHLORIDE 0.9 % IV SOLN
100.0000 mg | Freq: Every day | INTRAVENOUS | Status: AC
  Administered 2019-11-01 – 2019-11-04 (×4): 100 mg via INTRAVENOUS
  Filled 2019-10-31 (×4): qty 20

## 2019-10-31 MED ORDER — ALBUTEROL SULFATE HFA 108 (90 BASE) MCG/ACT IN AERS
2.0000 | INHALATION_SPRAY | RESPIRATORY_TRACT | Status: DC | PRN
  Administered 2019-11-02: 2 via RESPIRATORY_TRACT
  Filled 2019-10-31: qty 6.7

## 2019-10-31 MED ORDER — GABAPENTIN 600 MG PO TABS
600.0000 mg | ORAL_TABLET | Freq: Two times a day (BID) | ORAL | Status: DC
  Administered 2019-10-31 – 2019-11-07 (×16): 600 mg via ORAL
  Filled 2019-10-31 (×18): qty 1

## 2019-10-31 MED ORDER — INSULIN ASPART 100 UNIT/ML ~~LOC~~ SOLN
0.0000 [IU] | Freq: Three times a day (TID) | SUBCUTANEOUS | Status: DC
Start: 2019-10-31 — End: 2019-10-31

## 2019-10-31 MED ORDER — UMECLIDINIUM BROMIDE 62.5 MCG/INH IN AEPB
1.0000 | INHALATION_SPRAY | Freq: Every day | RESPIRATORY_TRACT | Status: DC
  Administered 2019-10-31 – 2019-11-07 (×8): 1 via RESPIRATORY_TRACT
  Filled 2019-10-31 (×2): qty 7

## 2019-10-31 NOTE — Consult Note (Addendum)
WOC Nurse Consult Note: Pt is in isolation for Covid. Reason for Consult: Consult requested for bilat heels.  Pt states she has been followed in the past by the outpatient wound care center but is not able to describe what she has been using for topical treatment.  Wound type: Bilat heels with chronic unstageable pressure injuries Pressure Injury POA: Yes Measurement: Left heel 1X1cm, 100% yellow and dry, no odor, drainage, or fluctuance, surrounded by loose peeling skin Left heel .5X.5cm, 100% yellow and dry, no odor, drainage, or fluctuance, surrounded by loose peeling skin Dressing procedure/placement/frequency: Float heels to reduce pressure. Topical treatment orders provided for bedside nurses to perform as follows to assist with enzymatic debridement of nonviable tissue: Apply Santyl to bilat heels Q day, then cover with moist 2X2 and foam dressing, then apply foam cup over the location and hold in place with kerlex.  (DO NOT THROW AWAY HEEL CUPS). Please re-consult if further assistance is needed.  Thank-you,  Julien Girt MSN, [place], [place], [place], [place]

## 2019-10-31 NOTE — Progress Notes (Addendum)
RT obtained ABG on pt with the following results. MD Chotiner was given critical PaCO2 of 74.9. PC above PEEP increased to 8 from 6 based on ABG.  Pt respiratory status is stable at this time w/no distress noted. RT will continue to monitor.    Results for Cassandra Barnes, Cassandra Barnes (MRN [id number]) as of 10/31/2019 02:20  Ref. Range 10/31/2019 02:07  Sample type Unknown ARTERIAL  pH, Arterial Latest Ref Range: 7.35 - 7.45  7.320 (L)  pCO2 arterial Latest Ref Range: 32 - 48 mmHg 74.9 (HH)  pO2, Arterial Latest Ref Range: 83 - 108 mmHg 96  TCO2 Latest Ref Range: 22 - 32 mmol/L 41 (H)  Acid-Base Excess Latest Ref Range: 0.0 - 2.0 mmol/L 11.0 (H)  Bicarbonate Latest Ref Range: 20.0 - 28.0 mmol/L 38.7 (H)  O2 Saturation Latest Units: % 96.0  Patient temperature Unknown 98.1 F  Collection site Unknown Radial

## 2019-10-31 NOTE — Progress Notes (Signed)
OT Note  Spoke with daughter over the phone who states that her Mom has been essentially bed ridden since May of 2020. Daughter states that her Mom has progressively gotten weaker until HHPT started working with her per request of her "foot doctor" 3 weeks ago. Daughter states that her Mom has gotten a "little stronger" since [place] and is interested in her Mom going to SNF for rehab if her Mom is agreeable so she can "do more for herself and hopefully get out of bed". Daughter states she is the primary caregiver and it has become increasingly more difficult for the daughter to care for her. Will follow up tomorrow and further assess.  Maurie Boettcher, OT/L   Acute OT Clinical Specialist Acute Rehabilitation Services Pager [phone] Office [phone]

## 2019-10-31 NOTE — Progress Notes (Signed)
Notified B. Chotiner MD pt hypotensive, 100s/40s, MAPs 55-65

## 2019-10-31 NOTE — Evaluation (Signed)
Clinical/Bedside Swallow Evaluation Patient Details  Name: Cassandra Barnes MRN: [id number] Date of Birth: 08-24-1945  Today's Date: 10/31/2019 Time: SLP Start Time (ACUTE ONLY): 0936 SLP Stop Time (ACUTE ONLY): 1003 SLP Time Calculation (min) (ACUTE ONLY): 27 min  Past Medical History:  Past Medical History:  Diagnosis Date  . CHF (congestive heart failure) ([place])   . COPD (chronic obstructive pulmonary disease) ([place])   . Diabetes mellitus without complication ([place])   . Hypertension   . Oxygen deficiency    Past Surgical History:  Past Surgical History:  Procedure Laterality Date  . ABDOMINAL HYSTERECTOMY    . CHOLECYSTECTOMY    . IR ANGIOGRAM EXTREMITY BILATERAL  04/11/2019  . IR FEM POP ART PTA MOD SED  04/11/2019  . IR US GUIDE VASC ACCESS RIGHT  04/11/2019   HPI:  Cassandra Barnes is a 74 y.o. female with medical history significant of chronic hypoxemic respiratory failure secondary to underlying COPD-on 2 L of oxygen, hypertension, diabetes, CHF, morbid obesity, gout, GERD, chronic pressure ulcers on heel, bedridden, Covid pneumonia presents to emergency department with worsening shortness of breath. Covid positive bac on 08/2019 and tested positive 7/26.   Assessment / Plan / Recommendation Clinical Impression  Pt's respirations at rest are stable and maintained this throughout swallow evaluation. She coordinated deglutition and respirations without indications of airway intrusion. She typically wears dentures when eating which are not present however, daughter is bringing them today. Pt should consume Barnes's in an upright position, take breaks if short of breath and educated purpose given COPD and Covid. Regular texture, thin liquids, pils with water, upright position and no further ST needed.    SLP Visit Diagnosis: Dysphagia, unspecified (R13.10)    Aspiration Risk  Mild aspiration risk    Diet Recommendation Regular;Thin liquid   Liquid Administration via: Straw;Cup Medication  Administration: Whole meds with liquid Supervision: Patient able to self feed Compensations: Slow rate;Small sips/bites Postural Changes: Seated upright at 90 degrees    Other  Recommendations Oral Care Recommendations: Oral care BID   Follow up Recommendations None      Frequency and Duration            Prognosis        Swallow Study   General HPI: Cassandra Barnes is a 74 y.o. female with medical history significant of chronic hypoxemic respiratory failure secondary to underlying COPD-on 2 L of oxygen, hypertension, diabetes, CHF, morbid obesity, gout, GERD, chronic pressure ulcers on heel, bedridden, Covid pneumonia presents to emergency department with worsening shortness of breath. Covid positive bac on 08/2019 and tested positive 7/26. Type of Study: Bedside Swallow Evaluation Previous Swallow Assessment: none Diet Prior to this Study: NPO Temperature Spikes Noted: No Respiratory Status: Nasal cannula History of Recent Intubation: No Behavior/Cognition: Alert;Cooperative;Pleasant mood Oral Cavity Assessment: Within Functional Limits Oral Care Completed by SLP: No Oral Cavity - Dentition: Edentulous Vision: Functional for self-feeding Self-Feeding Abilities: Able to feed self Patient Positioning: Upright in bed Baseline Vocal Quality: Normal Volitional Cough: Strong Volitional Swallow: Able to elicit    Oral/Motor/Sensory Function Overall Oral Motor/Sensory Function: Within functional limits   Ice Chips Ice chips: Not tested   Thin Liquid Thin Liquid: Within functional limits Presentation: Cup;Straw    Nectar Thick Nectar Thick Liquid: Not tested   Honey Thick Honey Thick Liquid: Not tested   Puree Puree: Within functional limits   Solid     Solid: Within functional limits      Mick Sell, Orbie Pyo  10/31/2019,10:28 AM  Orbie Pyo Colvin Caroli.Ed Risk analyst [phone] Office [phone]

## 2019-10-31 NOTE — Progress Notes (Signed)
   10/30/19 1918  Assess: MEWS Score  Temp 98.2 F (36.8 C)  BP (!) 99/45  Pulse Rate 71  ECG Heart Rate 71  Resp 13  Level of Consciousness Responds to Voice  SpO2 94 %  O2 Device Nasal Cannula  Patient Activity (if Appropriate) In bed  O2 Flow Rate (L/min) 2 L/min  Assess: MEWS Score  MEWS Temp 0  MEWS Systolic 1  MEWS Pulse 0  MEWS RR 1  MEWS LOC 1  MEWS Score 3  MEWS Score Color Yellow  Assess: if the MEWS score is Yellow or Red  Were vital signs taken at a resting state? Yes  Focused Assessment Change from prior assessment (see assessment flowsheet)  Early Detection of Sepsis Score *See Row Information* High  MEWS guidelines implemented *See Row Information* Yes  Treat  MEWS Interventions Escalated (See documentation below)  Pain Scale PAINAD  Breathing 0  Negative Vocalization 0  Facial Expression 0  Body Language 0  Consolability 0  PAINAD Score 0  Take Vital Signs  Increase Vital Sign Frequency  Yellow: Q 2hr X 2 then Q 4hr X 2, if remains yellow, continue Q 4hrs  Escalate  MEWS: Escalate Yellow: discuss with charge nurse/RN and consider discussing with provider and RRT  Notify: Charge Nurse/RN  Name of Charge Nurse/RN Notified Kristen, RN  Date Charge Nurse/RN Notified 10/30/19  Time Charge Nurse/RN Notified 1940  Notify: Provider  Provider Name/Title Chotiner, MD  Date Provider Notified 10/30/19  Time Provider Notified 1950  Notification Type Page  Notification Reason Change in status (Patient lethargic.2nd 569ml bolus.Rapid response following.)  Response See new orders  Date of Provider Response 10/31/19  Time of Provider Response 1955  Notify: Rapid Response  Name of Rapid Response RN Notified Mindy  Date Rapid Response Notified 10/30/19  Time Rapid Response Notified 1918  Document  Patient Outcome Transferred/level of care increased   LR 500 ml bolus given. ABG done. Patient transferred to 8M to be placed on Bipap. RN report given to [employer]

## 2019-10-31 NOTE — Evaluation (Signed)
Physical Therapy Evaluation Patient Details Name: Cassandra Barnes MRN: [id number] DOB: [birth year] Today's Date: 10/31/2019   History of Present Illness   Cassandra Barnes is a 74 y.o. female with medical history significant for chronic hypoxemic respiratory failure secondary to underlying COPD-on 2 L of oxygen via nasal cannula, hypertension, diabetes, CHF, morbid obesity, gout, GERD, chronic pressure ulcers on heel, bedridden, Covid pneumonia presents to emergency department with worsening shortness of breath since 1 week and confusion started yesterday and her symptoms got worse this morning therefore family called EMS and brought patient to the emergency department for further evaluation and management.  Clinical Impression  Pt admitted with/for SOB and Confusion.  Pt is bedridden and does not feel that she is ready to get up to the EOB.  So the evaluation was mobility limited and pt completed some there ex for upper and LE's.  Pt currently limited functionally due to the problems listed below.  (see problems list.)  Pt will benefit from PT to maximize function and safety to be able to get home safely with available assist .     Follow Up Recommendations Home health PT;Supervision/Assistance - 24 hour    Equipment Recommendations       Recommendations for Other Services       Precautions / Restrictions Precautions Precautions: Fall Precaution Comments: bedridden      Mobility  Bed Mobility Overal bed mobility: Needs Assistance Bed Mobility: Rolling Rolling: Mod assist;Max assist         General bed mobility comments: bil rolling for peri care.  Pt needed extra assist to abduct her legsfor peri care.  Transfers                 General transfer comment: pt declined, stated she gets up for appt's only by lift.  Ambulation/Gait             General Gait Details: N/A  Stairs            Wheelchair Mobility    Modified Rankin (Stroke Patients Only)        Balance Overall balance assessment:  (NT)                                           Pertinent Vitals/Pain Pain Assessment: Faces Faces Pain Scale: Hurts little more Pain Location: feet/heels, legs and general Pain Descriptors / Indicators: Discomfort;Grimacing;Guarding Pain Intervention(s): Monitored during session    Home Living Family/patient expects to be discharged to:: Private residence Living Arrangements: Spouse/significant other Available Help at Discharge: Family;Available 24 hours/day (spouse and dtr) Type of Home: House Home Access: Ramped entrance     Home Layout: One level Home Equipment: Hospital bed (hoyer lift) Additional Comments: pt in bed unles to MD appt, etc.    Prior Function Level of Independence: Needs assistance   Gait / Transfers Assistance Needed: bedridden since before COVID  ADL's / Homemaking Assistance Needed: assisted for all ADL's, prepped for eating.        Hand Dominance   Dominant Hand: Right    Extremity/Trunk Assessment   Upper Extremity Assessment Upper Extremity Assessment:  (moves BIL UE's against gravity.)    Lower Extremity Assessment Lower Extremity Assessment: RLE deficits/detail;LLE deficits/detail RLE Deficits / Details: stiff/sore and weak movement against gravity. RLE Coordination: decreased fine motor LLE Deficits / Details: weak movement against gravity.  stift with decreased  ROM LLE Coordination: decreased fine motor       Communication   Communication: No difficulties  Cognition Arousal/Alertness: Awake/alert Behavior During Therapy: WFL for tasks assessed/performed Overall Cognitive Status: No family/caregiver present to determine baseline cognitive functioning                                        General Comments      Exercises     Assessment/Plan    PT Assessment Patient needs continued PT services  PT Problem List Decreased strength;Decreased activity  tolerance;Decreased range of motion;Decreased mobility;Decreased safety awareness       PT Treatment Interventions Functional mobility training;Therapeutic activities;Therapeutic exercise;Balance training;Patient/family education    PT Goals (Current goals can be found in the Care Plan section)  Acute Rehab PT Goals Patient Stated Goal: restart therapy when home.    Frequency Min 2X/week   Barriers to discharge        Co-evaluation               AM-PAC PT "6 Clicks" Mobility  Outcome Measure Help needed turning from your back to your side while in a flat bed without using bedrails?: A Lot Help needed moving from lying on your back to sitting on the side of a flat bed without using bedrails?: Total Help needed moving to and from a bed to a chair (including a wheelchair)?: Total Help needed standing up from a chair using your arms (e.g., wheelchair or bedside chair)?: Total Help needed to walk in hospital room?: Total Help needed climbing 3-5 steps with a railing? : Total 6 Click Score: 7    End of Session   Activity Tolerance: Patient tolerated treatment well (some increased pain/discomfort) Patient left: in bed;with call bell/phone within reach Nurse Communication: Need for lift equipment;Mobility status PT Visit Diagnosis: Unsteadiness on feet (R26.81);Other abnormalities of gait and mobility (R26.89);Muscle weakness (generalized) (M62.81);Pain Pain - part of body: Knee (both)    Time: 4696-2952 PT Time Calculation (min) (ACUTE ONLY): 29 min   Charges:   PT Evaluation $PT Eval Moderate Complexity: 1 Mod PT Treatments $Therapeutic Activity: 8-22 mins        10/31/2019  Cassandra Carne., PT Acute Rehabilitation Services [phone]  (pager) [phone]  (office)  Cassandra Barnes 10/31/2019, 4:11 PM

## 2019-10-31 NOTE — Progress Notes (Addendum)
PROGRESS NOTE                                                                                                                                                                                                             Patient Demographics:    Cassandra Barnes, is a 74 y.o. female, DOB - 16-Oct-1945, TZG:[id number]  Outpatient Primary MD for the patient is Cher Nakai, MD   Admit date - 10/30/2019   LOS - 1  Chief Complaint  Patient presents with  . Shortness of Breath  . Fever       Brief Narrative: Patient is a 74 y.o. female with PMHx of COVID-19 infection sometime in May 2021-( hospitalized at [facility]), COPD on 2 L of oxygen at home, chronic diastolic heart failure, HTN, DM-2, bedridden with chronic heel ulcers-presented with shortness of breath, fever along with confusion-found to have hypercarbia, possible COVID-19 pneumonitis-and subsequently admitted to the hospitalist service.  See below for further details.  Significant Events: 7/26>> admit to [facility] for confusion/hypercarbia/COVID-19 pneumonia 7/26>> transfer to [facility]-as patient developed lethargy-ABG with hypercarbia-briefly required BiPAP. 7/27>> liberated off BiPAP-awake/alert  Significant studies: 7/27>> echo: EF 75-91%, grade 2 diastolic dysfunction.  Interventricular septum flattened-consistent with volume overload. 7/27>> CTA chest: No PE, bilateral lower lobe infiltrates worse on the right, changes of cirrhosis of the liver 7/27>> lower extremity Doppler (prelim): No DVT  COVID-19 medications: Steroids: 7/26>> Remdesivir: 7/27  Antibiotics: Rocephin: 7/26>> Zithromax: 7/26>>  Microbiology data: 7/26: Blood culture>> negative so far  Procedures: None  Consults: None  DVT prophylaxis: enoxaparin (LOVENOX) injection 40 mg Start: 11/01/19 1000 SCDs Start: 10/30/19 1429    Subjective:    Cassandra Barnes today is completely awake and alert-poor  historian-appears to have some cognitive issues and does not remember stuff well.  She continues to cough-which is mostly chronic-breathing appears at baseline.   Assessment  & Plan :   Sepsis secondary to pneumonia: Not sure if this is all related to Covid 19 pneumonia-suspect there may be a bacterial component as well.  Patient apparently had COVID-19 pneumonitis sometime in May 2021 and required hospitalization in [place] in [place].  Sepsis physiology has improved-BP more stable after starting midodrine-continue steroids/remdesivir and empiric Rocephin/Zithromax.  Follow culture data and inflammatory markers.  Fever: afebrile O2 requirements:  SpO2: 97 % O2 Flow Rate (L/min): 2  L/min FiO2 (%): 40 %   COVID-19 Labs: Recent Labs    10/30/19 1530 10/31/19 0343  DDIMER 3.56* 1.56*  FERRITIN 24 41  LDH 131  --   CRP 4.6* 10.2*       Component Value Date/Time   BNP 178.3 (H) 10/30/2019 1530    Recent Labs  Lab 10/30/19 1530  PROCALCITON 0.49    Lab Results  Component Value Date   SARSCOV2NAA POSITIVE (A) 10/30/2019     Prone/Incentive Spirometry:  incentive spirometry use 3-4/hour.  Acute metabolic encephalopathy: Likely secondary to hypercarbia-resolved-she is completely awake and alert-she briefly required BiPAP last night.  Acute on chronic hypoxic/hypercarbic respiratory failure: Suspect multifactorial from pneumonia and possible decompensated diastolic heart failure.  Continue supportive care-continue IV antibiotics-since blood pressure not improved with midodrine-we will start IV Lasix.  Acute on chronic diastolic heart failure: Volume overloaded-but bedbound and with hypoalbuminemia-however echo also shows evidence of volume overload.  Possible undiagnosed cirrhosis on CT imaging.  Since blood pressure stable with midodrine-we will attempt gentle diuresis-1 dose of IV Lasix-keep in negative balance-follow.  COPD with chronic hypoxic/hypercarbic respiratory  failure: Does not appear to be in exacerbation-no wheezing-but remains at risk for hypercarbia-prudent to continue with CPAP nightly until more stable.  Have hypercarbia last night.  Continue bronchodilators.  AKI: Appears to be mild-likely hemodynamically mediated-significantly volume overloaded-attempting cautious diuretics  Hypotension: Probably related to sepsis-improved with midodrine-follow.  Hyperkalemia: Uncertain etiology-will be given Lokelma-also getting Lasix.  Repeat electrolytes in the morning.  HTN: Hold all antihypertensives due to hypotension-patient on midodrine.  DM-2: I have restarted home dosing of Lantus and SSI-watch closely as patient on steroids.  Recent Labs    10/31/19 0325 10/31/19 0805 10/31/19 1125  GLUCAP 268* 263* 296*   CT chest evidence of evidence of cirrhosis: Stable for outpatient follow-up-we will be on diuretics in any event.  Normocytic anemia: Secondary to chronic illness/ulceration-no evidence of blood loss-stable for follow-up-no indication for transfusion.  Peripheral neuropathy: Continue Neurontin  Bilateral lower extremity heel ulcers: Chronic issue-appreciate wound care input.  Bedbound with significant debility/deconditioning at baseline for more than 1 year  Palliative care discussion: Bedbound for more than a year-COPD with chronic hypoxemia/hypercarbia on 2 L of oxygen at home-with encephalopathy and worsening respiratory failure from pneumonia/CHF.  Discussed with patient at length regarding CODE STATUS-she would like to discuss with her family.  Currently stable on 2 L of oxygen-we will follow discussions/reach out to family over the next few days.  Overall long-term prognosis is poor given extensive medical comorbidities and bedbound status.  Morbid Obesity: Estimated body mass index is 41.05 kg/m (pended) as calculated from the following:   Height as of 04/11/19: (P) 5\' 8"  (1.727 m).   Weight as of 02/10/14: 122.5 kg.   RN  pressure injury documentation: Pressure Injury 10/30/19 Heel Right;Left Unstageable - Full thickness tissue loss in which the base of the injury is covered by slough (yellow, tan, gray, green or brown) and/or eschar (tan, brown or black) in the wound bed. (Active)  10/30/19   Location: Heel  Location Orientation: Right;Left  Staging: Unstageable - Full thickness tissue loss in which the base of the injury is covered by slough (yellow, tan, gray, green or brown) and/or eschar (tan, brown or black) in the wound bed.  Wound Description (Comments):   Present on Admission: Yes    GI prophylaxis: PPI  ABG:    Component Value Date/Time   PHART 7.320 (L) 10/31/2019 0207   PCO2ART  74.9 (HH) 10/31/2019 0207   PO2ART 96 10/31/2019 0207   HCO3 38.7 (H) 10/31/2019 0207   TCO2 41 (H) 10/31/2019 0207   O2SAT 96.0 10/31/2019 0207    Vent Settings:  Vent Mode: BIPAP;PCV FiO2 (%):  [40 %] 40 % Set Rate:  [15 bmp] 15 bmp PEEP:  [6 cmH20] 6 cmH20 Pressure Support:  [6 cmH20] 6 cmH20  Condition - Extremely Guarded  Family Communication  :  Daughter updated over the phone  Code Status :  Full Code  Diet :  Diet Order            Diet Carb Modified Fluid consistency: Thin; Room service appropriate? Yes with Assist; Fluid restriction: 1500 mL Fluid  Diet effective now                  Disposition Plan  :  Status is: Inpatient  Remains inpatient appropriate because:Inpatient level of care appropriate due to severity of illness   Dispo: The patient is from: Home              Anticipated d/c is to: Home              Anticipated d/c date is: > 3 days              Patient currently is not medically stable to d/c.   Barriers to discharge: Hypoxia requiring O2 supplementation/complete 5 days of IV Remdesivir  Antimicorbials  :    Anti-infectives (From admission, onward)   Start     Dose/Rate Route Frequency Ordered Stop   11/01/19 1000  remdesivir 100 mg in sodium chloride 0.9 % 100  mL IVPB     Discontinue     100 mg 200 mL/hr over 30 Minutes Intravenous Daily 10/31/19 0749 11/05/19 0959   10/31/19 1400  vancomycin (VANCOREADY) IVPB 1500 mg/300 mL  Status:  Discontinued        1,500 mg 150 mL/hr over 120 Minutes Intravenous Every 24 hours 10/30/19 1311 10/30/19 1457   10/31/19 0830  remdesivir 200 mg in sodium chloride 0.9% 250 mL IVPB        200 mg 580 mL/hr over 30 Minutes Intravenous Once 10/31/19 0749 10/31/19 0928   10/30/19 1730  piperacillin-tazobactam (ZOSYN) IVPB 3.375 g  Status:  Discontinued        3.375 g 12.5 mL/hr over 240 Minutes Intravenous Every 8 hours 10/30/19 1311 10/30/19 1457   10/30/19 1600  cefTRIAXone (ROCEPHIN) 1 g in sodium chloride 0.9 % 100 mL IVPB     Discontinue     1 g 200 mL/hr over 30 Minutes Intravenous Every 24 hours 10/30/19 1432     10/30/19 1530  azithromycin (ZITHROMAX) 500 mg in sodium chloride 0.9 % 250 mL IVPB     Discontinue     500 mg 250 mL/hr over 60 Minutes Intravenous Every 24 hours 10/30/19 1432     10/30/19 1200  vancomycin (VANCOCIN) 2,500 mg in sodium chloride 0.9 % 500 mL IVPB        2,500 mg 250 mL/hr over 120 Minutes Intravenous  Once 10/30/19 1148 10/30/19 1648   10/30/19 1130  piperacillin-tazobactam (ZOSYN) IVPB 3.375 g        3.375 g 100 mL/hr over 30 Minutes Intravenous  Once 10/30/19 1116 10/30/19 1516      Inpatient Medications  Scheduled Meds: . albuterol  2 puff Inhalation Q6H  . vitamin C  500 mg Oral Daily  . aspirin EC  81  mg Oral Daily  . Chlorhexidine Gluconate Cloth  6 each Topical Daily  . colchicine  0.6 mg Oral Daily  . collagenase   Topical Daily  . [START ON 11/01/2019] enoxaparin (LOVENOX) injection  40 mg Subcutaneous Q24H  . [START ON 11/01/2019] ferrous sulfate  325 mg Oral Q breakfast  . gabapentin  600 mg Oral BID  . insulin aspart  0-20 Units Subcutaneous TID WC  . insulin aspart  0-5 Units Subcutaneous QHS  . insulin aspart  8 Units Subcutaneous TID WC  . insulin glargine   45 Units Subcutaneous BID  . methylPREDNISolone (SOLU-MEDROL) injection  40 mg Intravenous Q12H  . midodrine  10 mg Oral TID WC  . pantoprazole  40 mg Oral BID  . sodium zirconium cyclosilicate  10 g Oral Daily  . umeclidinium bromide  1 puff Inhalation Daily  . zinc sulfate  220 mg Oral Daily   Continuous Infusions: . azithromycin 500 mg (10/31/19 1344)  . cefTRIAXone (ROCEPHIN)  IV Stopped (10/30/19 1750)  . [START ON 11/01/2019] remdesivir 100 mg in NS 100 mL     PRN Meds:.acetaminophen, albuterol, chlorpheniramine-HYDROcodone, guaiFENesin-dextromethorphan, ipratropium-albuterol, ondansetron **OR** ondansetron (ZOFRAN) IV   Time Spent in minutes  35    See all Orders from today for further details   Oren Binet M.D on 10/31/2019 at 1:50 PM  To page go to [email or web address] - use universal password  [facility] -  Office  [phone]    Objective:   Vitals:   10/31/19 1000 10/31/19 1100 10/31/19 1126 10/31/19 1200  BP: 99/77   (!) 97/64  Pulse: 66 65  67  Resp: 17 16  19   Temp:   98 F (36.7 C)   TempSrc:   Oral   SpO2: 99% 97%  97%    Wt Readings from Last 3 Encounters:  02/10/14 122.5 kg     Intake/Output Summary (Last 24 hours) at 10/31/2019 1350 Last data filed at 10/31/2019 1100 Gross per 24 hour  Intake 1373.87 ml  Output --  Net 1373.87 ml     Physical Exam Gen Exam:Alert awake-not in any distress HEENT:atraumatic, normocephalic Chest: Few bibasilar rales CVS:S1S2 regular Abdomen:soft non tender, non distended Extremities:+++ edema Neurology: Generalized weakness but appears nonfocal  skin: no rash   Data Review:    CBC Recent Labs  Lab 10/30/19 1100 10/30/19 1530 10/31/19 0207 10/31/19 0343  WBC 14.2* 13.4*  --  10.7*  HGB 8.1* 8.0* 9.2* 9.2*  HCT 29.4* 29.2* 27.0* 33.0*  PLT 182 177  --  172  MCV 98.3 100.0  --  100.0  MCH 27.1 27.4  --  27.9  MCHC 27.6* 27.4*  --  27.9*  RDW 14.7 14.7  --  14.7  LYMPHSABS 1.3  --   --   1.4  MONOABS 0.8  --   --  0.2  EOSABS 0.0  --   --  0.0  BASOSABS 0.0  --   --  0.0    Chemistries  Recent Labs  Lab 10/30/19 1100 10/30/19 1530 10/31/19 0207 10/31/19 0343  NA 136  --  134* 137  K 4.6  --  5.4* 5.8*  CL 92*  --   --  97*  CO2 37*  --   --  27  GLUCOSE 109*  --   --  266*  BUN 16  --   --  26*  CREATININE 1.23* 1.42*  --  1.27*  CALCIUM 9.2  --   --  8.7*  MG  --   --   --  1.9  AST 26  --   --  27  ALT 17  --   --  17  ALKPHOS 64  --   --  61  BILITOT 0.5  --   --  0.5   ------------------------------------------------------------------------------------------------------------------ No results for input(s): CHOL, HDL, LDLCALC, TRIG, CHOLHDL, LDLDIRECT in the last 72 hours.  No results found for: HGBA1C ------------------------------------------------------------------------------------------------------------------ No results for input(s): TSH, T4TOTAL, T3FREE, THYROIDAB in the last 72 hours.  Invalid input(s): FREET3 ------------------------------------------------------------------------------------------------------------------ Recent Labs    10/30/19 1530 10/31/19 0343  FERRITIN 24 41    Coagulation profile Recent Labs  Lab 10/30/19 1100  INR 1.1    Recent Labs    10/30/19 1530 10/31/19 0343  DDIMER 3.56* 1.56*    Cardiac Enzymes No results for input(s): CKMB, TROPONINI, MYOGLOBIN in the last 168 hours.  Invalid input(s): CK ------------------------------------------------------------------------------------------------------------------    Component Value Date/Time   BNP 178.3 (H) 10/30/2019 1530    Micro Results Recent Results (from the past 240 hour(s))  Culture, blood (Routine x 2)     Status: None (Preliminary result)   Collection Time: 10/30/19 11:00 AM   Specimen: BLOOD LEFT FOREARM  Result Value Ref Range Status   Specimen Description BLOOD LEFT FOREARM  Final   Special Requests   Final    BOTTLES DRAWN AEROBIC  AND ANAEROBIC Blood Culture adequate volume   Culture   Final    NO GROWTH 1 DAY Performed at [facility], [place] [address] [place], [place] [address]    Report Status PENDING  Incomplete  SARS Coronavirus 2 by RT PCR (hospital order, performed in [facility] hospital lab) Nasopharyngeal Nasopharyngeal Swab     Status: Abnormal   Collection Time: 10/30/19 11:33 AM   Specimen: Nasopharyngeal Swab  Result Value Ref Range Status   SARS Coronavirus 2 POSITIVE (A) NEGATIVE Final    Comment: RESULT CALLED TO, READ BACK BY AND VERIFIED WITH: Lowell Bouton RN 13:40 10/30/19 ([email or web address]) (NOTE) SARS-CoV-2 target nucleic acids are DETECTED  SARS-CoV-2 RNA is generally detectable in upper respiratory specimens  during the acute phase of infection.  Positive results are indicative  of the presence of the identified virus, but do not rule out bacterial infection or co-infection with other pathogens not detected by the test.  Clinical correlation with patient history and  other diagnostic information is necessary to determine patient infection status.  The expected result is negative.  Fact Sheet for Patients:   StrictlyIdeas.no   Fact Sheet for Healthcare Providers:   BankingDealers.co.za    This test is not yet approved or cleared by the [place] FDA and  has been authorized for detection and/or diagnosis of SARS-CoV-2 by FDA under an Emergency Use Authorization (EUA).  This EUA will remain in effect (meaning this te st can be used) for the duration of  the COVID-19 declaration under Section 564(b)(1) of the Act, 21 U.S.C. section 360-bbb-3(b)(1), unless the authorization is terminated or revoked sooner.  Performed at [facility], [place] [address], [place], [place] [address]   Culture, blood (Routine x 2)     Status: None (Preliminary result)   Collection Time: 10/30/19  3:36 PM   Specimen: BLOOD  Result Value Ref Range Status    Specimen Description BLOOD RIGHT ANTECUBITAL  Final   Special Requests   Final    BOTTLES DRAWN AEROBIC ONLY Blood Culture adequate volume  Culture   Final    NO GROWTH < 24 HOURS Performed at [facility], [place] [address], [place], [place] [address]    Report Status PENDING  Incomplete    Radiology Reports CT ANGIO CHEST PE W OR WO CONTRAST  Result Date: 10/31/2019 CLINICAL DATA:  Fever and COVID-19 positivity with shortness of breath EXAM: CT ANGIOGRAPHY CHEST WITH CONTRAST TECHNIQUE: Multidetector CT imaging of the chest was performed using the standard protocol during bolus administration of intravenous contrast. Multiplanar CT image reconstructions and MIPs were obtained to evaluate the vascular anatomy. CONTRAST:  51mL OMNIPAQUE IOHEXOL 350 MG/ML SOLN COMPARISON:  10/30/2019 FINDINGS: Cardiovascular: Thoracic aorta demonstrates atherosclerotic calcifications without aneurysmal dilatation. The degree of opacification is somewhat limited. Stable cardiomegaly is noted. Pulmonary artery demonstrates a normal branching pattern. No definitive filling defect to suggest pulmonary embolism is noted. Coronary calcifications are noted. Mediastinum/Nodes: Thoracic inlet is within normal limits. Scattered hilar and mediastinal lymph nodes are noted slightly more prominent than that seen on the prior exam particularly in the subcarinal region. These are felt to be reactive in nature. The esophagus as visualized is within normal limits. Lungs/Pleura: Emphysematous changes are noted in the lungs bilaterally. Patchy bilateral lower lobe infiltrates are noted right greater than left. The degree of aeration has improved although some worsening of the infiltrate is noted particularly in the superior aspect of the right lower lobe. Upper Abdomen: Upper abdomen demonstrates cirrhotic change of the liver. No other focal abnormality is seen. Musculoskeletal: Degenerative changes of the thoracic spine are noted.  Review of the MIP images confirms the above findings. IMPRESSION: No evidence of pulmonary emboli. Bilateral lower lobe waxing and waning infiltrates worse on the right. No sizable effusions are seen. Reactive hilar and mediastinal adenopathy is noted. Changes of cirrhosis of the liver. Aortic Atherosclerosis (ICD10-I70.0). Electronically Signed   By: Inez Catalina M.D.   On: 10/31/2019 01:33   DG Chest Port 1 View  Result Date: 10/30/2019 CLINICAL DATA:  Shortness of breath, fever EXAM: PORTABLE CHEST 1 VIEW COMPARISON:  08/12/2019 FINDINGS: Bilateral perihilar and lower lung opacities. No significant pleural effusion. No pneumothorax. Cardiomegaly. IMPRESSION: Bilateral perihilar lower lung opacities may reflect multifocal pneumonia or edema. Cardiomegaly. Electronically Signed   By: Macy Mis M.D.   On: 10/30/2019 12:10   VAS [place] LOWER EXTREMITY VENOUS (DVT)  Result Date: 10/31/2019  Lower Venous DVTStudy Indications: Swelling, and Edema.  Limitations: Poor ultrasound/tissue interface and body habitus. Comparison Study: no prior Performing Technologist: Abram Sander RVS  Examination Guidelines: A complete evaluation includes B-mode imaging, spectral Doppler, color Doppler, and power Doppler as needed of all accessible portions of each vessel. Bilateral testing is considered an integral part of a complete examination. Limited examinations for reoccurring indications may be performed as noted. The reflux portion of the exam is performed with the patient in reverse Trendelenburg.  +---------+---------------+---------+-----------+----------+--------------+ RIGHT    CompressibilityPhasicitySpontaneityPropertiesThrombus Aging +---------+---------------+---------+-----------+----------+--------------+ CFV      Full           Yes      Yes                                 +---------+---------------+---------+-----------+----------+--------------+ SFJ      Full                                                         +---------+---------------+---------+-----------+----------+--------------+  FV Prox  Full                                                        +---------+---------------+---------+-----------+----------+--------------+ FV Mid   Full                                                        +---------+---------------+---------+-----------+----------+--------------+ FV Distal                                             Not visualized +---------+---------------+---------+-----------+----------+--------------+ PFV      Full                                                        +---------+---------------+---------+-----------+----------+--------------+ POP      Full           Yes      Yes                                 +---------+---------------+---------+-----------+----------+--------------+ PTV                                                   Not visualized +---------+---------------+---------+-----------+----------+--------------+ PERO                                                  Not visualized +---------+---------------+---------+-----------+----------+--------------+   +---------+---------------+---------+-----------+----------+--------------+ LEFT     CompressibilityPhasicitySpontaneityPropertiesThrombus Aging +---------+---------------+---------+-----------+----------+--------------+ CFV      Full           Yes      Yes                                 +---------+---------------+---------+-----------+----------+--------------+ SFJ      Full                                                        +---------+---------------+---------+-----------+----------+--------------+ FV Prox  Full                                                        +---------+---------------+---------+-----------+----------+--------------+ FV Mid  Not visualized  +---------+---------------+---------+-----------+----------+--------------+ FV Distal                                             Not visualized +---------+---------------+---------+-----------+----------+--------------+ PFV      Full                                                        +---------+---------------+---------+-----------+----------+--------------+ POP      Full           Yes      Yes                                 +---------+---------------+---------+-----------+----------+--------------+ PTV                                                   Not visualized +---------+---------------+---------+-----------+----------+--------------+ PERO                                                  Not visualized +---------+---------------+---------+-----------+----------+--------------+     Summary: BILATERAL: -No evidence of popliteal cyst, bilaterally. RIGHT: - There is no evidence of deep vein thrombosis in the lower extremity. However, portions of this examination were limited- see technologist comments above.  LEFT: - There is no evidence of deep vein thrombosis in the lower extremity. However, portions of this examination were limited- see technologist comments above.  *See table(s) above for measurements and observations.    Preliminary

## 2019-10-31 NOTE — Progress Notes (Signed)
  Echocardiogram 2D Echocardiogram has been performed.  Jannett Celestine 10/31/2019, 12:53 PM

## 2019-10-31 NOTE — Progress Notes (Signed)
Lower extremity venous has been completed.   Preliminary results in CV Proc.   Abram Sander 10/31/2019 11:38 AM

## 2019-11-01 DIAGNOSIS — E119 Type 2 diabetes mellitus without complications: Secondary | ICD-10-CM

## 2019-11-01 LAB — CBC WITH DIFFERENTIAL/PLATELET
Abs Immature Granulocytes: 0.09 10*3/uL — ABNORMAL HIGH (ref 0.00–0.07)
Basophils Absolute: 0 10*3/uL (ref 0.0–0.1)
Basophils Relative: 0 %
Eosinophils Absolute: 0 10*3/uL (ref 0.0–0.5)
Eosinophils Relative: 0 %
HCT: 28.7 % — ABNORMAL LOW (ref 36.0–46.0)
Hemoglobin: 8.2 g/dL — ABNORMAL LOW (ref 12.0–15.0)
Immature Granulocytes: 1 %
Lymphocytes Relative: 15 %
Lymphs Abs: 1.4 10*3/uL (ref 0.7–4.0)
MCH: 27.6 pg (ref 26.0–34.0)
MCHC: 28.6 g/dL — ABNORMAL LOW (ref 30.0–36.0)
MCV: 96.6 fL (ref 80.0–100.0)
Monocytes Absolute: 0.3 10*3/uL (ref 0.1–1.0)
Monocytes Relative: 3 %
Neutro Abs: 7.1 10*3/uL (ref 1.7–7.7)
Neutrophils Relative %: 81 %
Platelets: 185 10*3/uL (ref 150–400)
RBC: 2.97 MIL/uL — ABNORMAL LOW (ref 3.87–5.11)
RDW: 14.6 % (ref 11.5–15.5)
WBC: 8.8 10*3/uL (ref 4.0–10.5)
nRBC: 0 % (ref 0.0–0.2)

## 2019-11-01 LAB — COMPREHENSIVE METABOLIC PANEL
ALT: 15 U/L (ref 0–44)
AST: 19 U/L (ref 15–41)
Albumin: 2.7 g/dL — ABNORMAL LOW (ref 3.5–5.0)
Alkaline Phosphatase: 56 U/L (ref 38–126)
Anion gap: 9 (ref 5–15)
BUN: 34 mg/dL — ABNORMAL HIGH (ref 8–23)
CO2: 33 mmol/L — ABNORMAL HIGH (ref 22–32)
Calcium: 8.5 mg/dL — ABNORMAL LOW (ref 8.9–10.3)
Chloride: 93 mmol/L — ABNORMAL LOW (ref 98–111)
Creatinine, Ser: 0.83 mg/dL (ref 0.44–1.00)
GFR calc Af Amer: >60 mL/min (ref >60)
GFR calc non Af Amer: >60 mL/min (ref >60)
Glucose, Bld: 195 mg/dL — ABNORMAL HIGH (ref 70–99)
Potassium: 5.1 mmol/L (ref 3.5–5.1)
Sodium: 135 mmol/L (ref 135–145)
Total Bilirubin: 0.3 mg/dL (ref 0.3–1.2)
Total Protein: 6.2 g/dL — ABNORMAL LOW (ref 6.5–8.1)

## 2019-11-01 LAB — MRSA PCR SCREENING: MRSA by PCR: NEGATIVE

## 2019-11-01 LAB — GLUCOSE, CAPILLARY
Glucose-Capillary: 223 mg/dL — ABNORMAL HIGH (ref 70–99)
Glucose-Capillary: 227 mg/dL — ABNORMAL HIGH (ref 70–99)
Glucose-Capillary: 215 mg/dL — ABNORMAL HIGH (ref 70–99)

## 2019-11-01 LAB — MAGNESIUM: Magnesium: 2.1 mg/dL (ref 1.7–2.4)

## 2019-11-01 LAB — HEMOGLOBIN A1C
Hgb A1c MFr Bld: 6.4 % — ABNORMAL HIGH (ref 4.8–5.6)
Mean Plasma Glucose: 136.98 mg/dL

## 2019-11-01 LAB — C-REACTIVE PROTEIN: CRP: 7.2 mg/dL — ABNORMAL HIGH (ref <1.0)

## 2019-11-01 LAB — FERRITIN: Ferritin: 36 ng/mL (ref 11–307)

## 2019-11-01 LAB — D-DIMER, QUANTITATIVE: D-Dimer, Quant: 3.26 ug/mL-FEU — ABNORMAL HIGH (ref 0.00–0.50)

## 2019-11-01 MED ORDER — ORAL CARE MOUTH RINSE
15.0000 mL | Freq: Two times a day (BID) | OROMUCOSAL | Status: DC
  Administered 2019-11-01 – 2019-11-07 (×11): 15 mL via OROMUCOSAL

## 2019-11-01 MED ORDER — INSULIN GLARGINE 100 UNIT/ML ~~LOC~~ SOLN
50.0000 [IU] | Freq: Two times a day (BID) | SUBCUTANEOUS | Status: DC
  Administered 2019-11-01 – 2019-11-07 (×13): 50 [IU] via SUBCUTANEOUS
  Filled 2019-11-01 (×13): qty 0.5

## 2019-11-01 MED ORDER — INSULIN ASPART 100 UNIT/ML ~~LOC~~ SOLN
10.0000 [IU] | Freq: Three times a day (TID) | SUBCUTANEOUS | Status: DC
  Administered 2019-11-01 – 2019-11-07 (×17): 10 [IU] via SUBCUTANEOUS

## 2019-11-01 MED ORDER — FUROSEMIDE 10 MG/ML IJ SOLN
40.0000 mg | Freq: Every day | INTRAMUSCULAR | Status: DC
  Administered 2019-11-01 – 2019-11-04 (×4): 40 mg via INTRAVENOUS
  Filled 2019-11-01 (×4): qty 4

## 2019-11-01 NOTE — TOC Initial Note (Signed)
Transition of Care [facility]) - Initial/Assessment Note    Patient Details  Name: Cassandra Barnes MRN: [id number] Date of Birth: 20-Nov-1945  [facility] CM/SW Contact:    Benard Halsted, LCSW Phone Number: 11/01/2019, 3:18 PM  Clinical Narrative:                 CSW received consult for possible SNF placement at time of discharge from patient's daughter. Patient's daughter stated that she was hoping to go where her sister was in [place]. CSW explained that there are only two SNFs accepting COVID patients at this time and provided her the locations of each. She stated that she would speak with her mom and contact CSW back because she does not think she will want to go to one of those. CSW to continue to follow and assist with discharge planning needs.     Barriers to Discharge: Continued Medical Work up   Patient Goals and CMS Choice Patient states their goals for this hospitalization and ongoing recovery are:: Rehab CMS Medicare.gov Compare Post Acute Care list provided to:: Patient Represenative (must comment) (Daughter) Choice offered to / list presented to : Patient, Adult Children  Expected Discharge Plan and Services   In-house Referral: Clinical Social Work     Living arrangements for the past 2 months: Single Family Home                                      Prior Living Arrangements/Services Living arrangements for the past 2 months: Single Family Home Lives with:: Adult Children, Spouse Patient language and need for interpreter reviewed:: Yes Do you feel safe going back to the place where you live?: Yes      Need for Family Participation in Patient Care: Yes (Comment) Care giver support system in place?: Yes (comment) Current home services: DME Criminal Activity/Legal Involvement Pertinent to Current Situation/Hospitalization: No - Comment as needed  Activities of Daily Living   ADL Screening (condition at time of admission) Patient's cognitive  ability adequate to safely complete daily activities?: Yes Is the patient deaf or have difficulty hearing?: No Does the patient have difficulty seeing, even when wearing glasses/contacts?: No Does the patient have difficulty concentrating, remembering, or making decisions?: Yes Patient able to express need for assistance with ADLs?: Yes Does the patient have difficulty dressing or bathing?: Yes Independently performs ADLs?: No Does the patient have difficulty walking or climbing stairs?: Yes Weakness of Legs: Both Weakness of Arms/Hands: Both  Permission Sought/Granted Permission sought to share information with : Facility Sport and exercise psychologist, Family Supports Permission granted to share information with : Yes, Verbal Permission Granted  Share Information with NAME: Patsy     Permission granted to share info w Relationship: Daughter  Permission granted to share info w Contact Information: [phone]  Emotional Assessment   Attitude/Demeanor/Rapport:  (Appropriate)   Orientation: : Oriented to Self, Oriented to Place, Oriented to  Time, Oriented to Situation Alcohol / Substance Use: Not Applicable Psych Involvement: No (comment)  Admission diagnosis:  Sepsis ([place]) [A41.9] Sepsis due to COVID-19 ([place]) [U07.1, A41.89] Patient Active Problem List   Diagnosis Date Noted   Hypertension    Sepsis due to COVID-19 ([place])    AKI (acute kidney injury) ([place])    Diabetes mellitus without complication ([facility])    CHF (congestive heart failure) ([place])    Normocytic anemia    COPD (chronic obstructive  pulmonary disease) ([place])    PCP:  Cher Nakai, MD Pharmacy:   [facility], [place] [address] Phone: [phone] Fax: [phone]     Social Determinants of Health (SDOH) Interventions    Readmission Risk Interventions No flowsheet data found.

## 2019-11-01 NOTE — Progress Notes (Signed)
PROGRESS NOTE                                                                                                                                                                                                             Patient Demographics:    Cassandra Barnes, is a 74 y.o. female, DOB - [birth year], TDV:[id number]  Outpatient Primary MD for the patient is Cher Nakai, MD   Admit date - 10/30/2019   LOS - 2  Chief Complaint  Patient presents with   Shortness of Breath   Fever       Brief Narrative: Patient is a 74 y.o. female with PMHx of COVID-19 infection sometime in May 2021-( hospitalized at [facility]), COPD on 2 L of oxygen at home, chronic diastolic heart failure, HTN, DM-2, bedridden with chronic heel ulcers-presented with shortness of breath, fever along with confusion-found to have hypercarbia, possible COVID-19 pneumonitis-and subsequently admitted to the hospitalist service.  See below for further details.  Significant Events: 7/26>> admit to [facility] for confusion/hypercarbia/COVID-19 pneumonia 7/26>> transfer to 28M-as patient developed lethargy-ABG with hypercarbia-briefly required BiPAP. 7/27>> liberated off BiPAP-awake/alert -transfer to 5W   Significant studies: 7/27>> echo: EF 06-26%, grade 2 diastolic dysfunction.  Interventricular septum flattened-consistent with volume overload. 7/27>> CTA chest: No PE, bilateral lower lobe infiltrates worse on the right, changes of cirrhosis of the liver 7/27>> lower extremity Doppler (prelim): No DVT  COVID-19 medications: Steroids: 7/26>> Remdesivir: 7/27  Antibiotics: Rocephin: 7/26>> Zithromax: 7/26>>  Microbiology data: 7/26: Blood culture>> negative so far  Procedures: None  Consults: None  DVT prophylaxis: enoxaparin (LOVENOX) injection 40 mg Start: 11/01/19 1000 SCDs Start: 10/30/19 1429    Subjective:   No major issues overnight-feels better-is  completely awake and alert.   Assessment  & Plan :   Sepsis secondary to pneumonia: Sepsis physiology has significantly improved-suspect pneumonia mostly from bacterial pneumonia-not sure if patient has COVID-19 pneumonia at this point-she has a history of having COVID-19 pneumonia in May 2021 requiring hospitalization in [facility] in [place].  BP stable but patient still remains on midodrine-we will continue Rocephin/Zithromax-and continue with steroids and remdesivir to cover for COVID-19    Fever: afebrile O2 requirements:  SpO2: 91 % O2 Flow Rate (L/min): 2 L/min FiO2 (%): 40 %   COVID-19 Labs: Recent Labs    10/30/19 1530 10/31/19 0343  11/01/19 0424  DDIMER 3.56* 1.56* 3.26*  FERRITIN 24 41 36  LDH 131  --   --   CRP 4.6* 10.2* 7.2*       Component Value Date/Time   BNP 178.3 (H) 10/30/2019 1530    Recent Labs  Lab 10/30/19 1530  PROCALCITON 0.49    Lab Results  Component Value Date   SARSCOV2NAA POSITIVE (A) 10/30/2019     Prone/Incentive Spirometry:  incentive spirometry use 3-4/hour.  Acute metabolic encephalopathy: Likely secondary to hypercarbia-resolved-she is completely awake and alert-she briefly required BiPAP-however no BiPAP requirement over the past 24 hours.  Acute on chronic hypoxic/hypercarbic respiratory failure: Suspect multifactorial from pneumonia and possible decompensated diastolic heart failure.  Continue IV Lasix and IV antimicrobial therapy.  Acute on chronic diastolic heart failure: Volume overloaded-but bedbound and with hypoalbuminemia-however echo also shows evidence of volume overload.  Possible undiagnosed cirrhosis on CT imaging.  Blood pressure remains stable with midodrine-continue IV Lasix-keep in negative balance.  Follow.   COPD with chronic hypoxic/hypercarbic respiratory failure: Does not appear to be in exacerbation-no wheezing-but remains at risk for hypercarbia--continue bronchodilators-avoid/minimize  narcotics/benzos as much as possible.  AKI: Appears to be mild-likely hemodynamically mediated-improved with supportive care-remains volume overloaded-continue with diuretics cautiously.  Hypotension: BP much stable-continue with midodrine-we will slowly taper over the next few days.  Hypotension probably related to sepsis physiology-no indication of volume loss or cardiogenic shock.  Hyperkalemia: Resolved with Lokelma and Lasix.  HTN: Hold all antihypertensives due to hypotension-patient on midodrine.  DM-2: CBGs on the higher side due to steroids-increase Lantus to 50 units twice daily, increase NovoLog to 10 units before meals-continue SSI.  Recent Labs    10/31/19 1553 10/31/19 2119 11/01/19 1255  GLUCAP 293* 215* 223*   CT chest evidence of evidence of cirrhosis: Stable for outpatient follow-up-we will be on diuretics in any event.  Peripheral neuropathy: Continue Neurontin  Bilateral lower extremity heel ulcers: Chronic issue-appreciate wound care input.  Bedbound with significant debility/deconditioning at baseline for more than 1 year  Palliative care discussion: Bedbound for more than a year-COPD with chronic hypoxemia/hypercarbia on 2 L of oxygen at home-with encephalopathy and worsening respiratory failure from pneumonia/CHF.  Discussed with patient at length regarding CODE STATUS-she would like to discuss with her family.  Currently stable on 2 L of oxygen-we will follow discussions/reach out to family over the next few days.  Overall long-term prognosis is poor given extensive medical comorbidities and bedbound status.  Morbid Obesity: Estimated body mass index is 42.07 kg/m as calculated from the following:   Height as of this encounter: 5\' 8"  (1.727 m).   Weight as of this encounter: 125.5 kg.   RN pressure injury documentation: Pressure Injury 10/30/19 Heel Right;Left Unstageable - Full thickness tissue loss in which the base of the injury is covered by slough  (yellow, tan, gray, green or brown) and/or eschar (tan, brown or black) in the wound bed. (Active)  10/30/19   Location: Heel  Location Orientation: Right;Left  Staging: Unstageable - Full thickness tissue loss in which the base of the injury is covered by slough (yellow, tan, gray, green or brown) and/or eschar (tan, brown or black) in the wound bed.  Wound Description (Comments):   Present on Admission: Yes    GI prophylaxis: PPI  ABG:    Component Value Date/Time   PHART 7.320 (L) 10/31/2019 0207   PCO2ART 74.9 (HH) 10/31/2019 0207   PO2ART 96 10/31/2019 0207   HCO3 38.7 (H) 10/31/2019  0207   TCO2 41 (H) 10/31/2019 0207   O2SAT 96.0 10/31/2019 0207    Vent Settings:     Condition - Extremely Guarded  Family Communication  :  Daughter updated over the phone  Code Status :  Full Code  Diet :  Diet Order            Diet Carb Modified Fluid consistency: Thin; Room service appropriate? Yes with Assist; Fluid restriction: 1500 mL Fluid  Diet effective now                  Disposition Plan  :  Status is: Inpatient  Remains inpatient appropriate because:Inpatient level of care appropriate due to severity of illness   Dispo: The patient is from: Home              Anticipated d/c is to: Home              Anticipated d/c date is: > 3 days              Patient currently is not medically stable to d/c.   Barriers to discharge: Hypoxia requiring O2 supplementation/complete 5 days of IV Remdesivir  Antimicorbials  :    Anti-infectives (From admission, onward)   Start     Dose/Rate Route Frequency Ordered Stop   11/01/19 1000  remdesivir 100 mg in sodium chloride 0.9 % 100 mL IVPB     Discontinue     100 mg 200 mL/hr over 30 Minutes Intravenous Daily 10/31/19 0749 11/05/19 0959   10/31/19 1400  vancomycin (VANCOREADY) IVPB 1500 mg/300 mL  Status:  Discontinued        1,500 mg 150 mL/hr over 120 Minutes Intravenous Every 24 hours 10/30/19 1311 10/30/19 1457    10/31/19 0830  remdesivir 200 mg in sodium chloride 0.9% 250 mL IVPB        200 mg 580 mL/hr over 30 Minutes Intravenous Once 10/31/19 0749 10/31/19 1730   10/30/19 1730  piperacillin-tazobactam (ZOSYN) IVPB 3.375 g  Status:  Discontinued        3.375 g 12.5 mL/hr over 240 Minutes Intravenous Every 8 hours 10/30/19 1311 10/30/19 1457   10/30/19 1600  cefTRIAXone (ROCEPHIN) 1 g in sodium chloride 0.9 % 100 mL IVPB     Discontinue     1 g 200 mL/hr over 30 Minutes Intravenous Every 24 hours 10/30/19 1432     10/30/19 1530  azithromycin (ZITHROMAX) 500 mg in sodium chloride 0.9 % 250 mL IVPB     Discontinue     500 mg 250 mL/hr over 60 Minutes Intravenous Every 24 hours 10/30/19 1432     10/30/19 1200  vancomycin (VANCOCIN) 2,500 mg in sodium chloride 0.9 % 500 mL IVPB        2,500 mg 250 mL/hr over 120 Minutes Intravenous  Once 10/30/19 1148 10/30/19 1648   10/30/19 1130  piperacillin-tazobactam (ZOSYN) IVPB 3.375 g        3.375 g 100 mL/hr over 30 Minutes Intravenous  Once 10/30/19 1116 10/30/19 1516      Inpatient Medications  Scheduled Meds:  albuterol  2 puff Inhalation Q6H   vitamin C  500 mg Oral Daily   aspirin EC  81 mg Oral Daily   Chlorhexidine Gluconate Cloth  6 each Topical Daily   colchicine  0.6 mg Oral Daily   collagenase   Topical Daily   enoxaparin (LOVENOX) injection  40 mg Subcutaneous Q24H   ferrous sulfate  325  mg Oral Q breakfast   gabapentin  600 mg Oral BID   insulin aspart  0-20 Units Subcutaneous TID WC   insulin aspart  0-5 Units Subcutaneous QHS   insulin aspart  8 Units Subcutaneous TID WC   insulin glargine  45 Units Subcutaneous BID   mouth rinse  15 mL Mouth Rinse BID   methylPREDNISolone (SOLU-MEDROL) injection  40 mg Intravenous Q12H   midodrine  10 mg Oral TID WC   pantoprazole  40 mg Oral BID   sodium zirconium cyclosilicate  10 g Oral Daily   umeclidinium bromide  1 puff Inhalation Daily   zinc sulfate  220 mg Oral  Daily   Continuous Infusions:  azithromycin Stopped (10/31/19 1511)   cefTRIAXone (ROCEPHIN)  IV Stopped (10/31/19 1646)   remdesivir 100 mg in NS 100 mL 100 mg (11/01/19 1033)   PRN Meds:.acetaminophen, albuterol, guaiFENesin-dextromethorphan, ipratropium-albuterol, ondansetron **OR** ondansetron (ZOFRAN) IV   Time Spent in minutes  25    See all Orders from today for further details   Oren Binet M.D on 11/01/2019 at 2:06 PM  To page go to [email or web address] - use universal password  [facility] -  Office  [phone]    Objective:   Vitals:   11/01/19 0759 11/01/19 0800 11/01/19 0805 11/01/19 1259  BP:  (!) 157/87  (!) 127/43  Pulse:  66 67 63  Resp:  17 16 16   Temp:  98.3 F (36.8 C)  99.1 F (37.3 C)  TempSrc:  Oral  Oral  SpO2: 93% 94% 91% 91%  Weight:   (!) 125.5 kg   Height:    5\' 8"  (1.727 m)    Wt Readings from Last 3 Encounters:  11/01/19 (!) 125.5 kg  02/10/14 122.5 kg     Intake/Output Summary (Last 24 hours) at 11/01/2019 1406 Last data filed at 11/01/2019 0600 Gross per 24 hour  Intake 1225.64 ml  Output 2500 ml  Net -1274.36 ml     Physical Exam Gen Exam:Alert awake-not in any distress HEENT:atraumatic, normocephalic Chest: B/L clear to auscultation anteriorly CVS:S1S2 regular Abdomen:soft non tender, non distended Extremities:++edema Neurology: Non focal-but with significant generalized weakness. Skin: no rash   Data Review:    CBC Recent Labs  Lab 10/30/19 1100 10/30/19 1530 10/31/19 0207 10/31/19 0343 11/01/19 0424  WBC 14.2* 13.4*  --  10.7* 8.8  HGB 8.1* 8.0* 9.2* 9.2* 8.2*  HCT 29.4* 29.2* 27.0* 33.0* 28.7*  PLT 182 177  --  172 185  MCV 98.3 100.0  --  100.0 96.6  MCH 27.1 27.4  --  27.9 27.6  MCHC 27.6* 27.4*  --  27.9* 28.6*  RDW 14.7 14.7  --  14.7 14.6  LYMPHSABS 1.3  --   --  1.4 1.4  MONOABS 0.8  --   --  0.2 0.3  EOSABS 0.0  --   --  0.0 0.0  BASOSABS 0.0  --   --  0.0 0.0    Chemistries    Recent Labs  Lab 10/30/19 1100 10/30/19 1530 10/31/19 0207 10/31/19 0343 11/01/19 0424  NA 136  --  134* 137 135  K 4.6  --  5.4* 5.8* 5.1  CL 92*  --   --  97* 93*  CO2 37*  --   --  27 33*  GLUCOSE 109*  --   --  266* 195*  BUN 16  --   --  26* 34*  CREATININE 1.23* 1.42*  --  1.27* 0.83  CALCIUM 9.2  --   --  8.7* 8.5*  MG  --   --   --  1.9 2.1  AST 26  --   --  27 19  ALT 17  --   --  17 15  ALKPHOS 64  --   --  61 56  BILITOT 0.5  --   --  0.5 0.3   ------------------------------------------------------------------------------------------------------------------ No results for input(s): CHOL, HDL, LDLCALC, TRIG, CHOLHDL, LDLDIRECT in the last 72 hours.  Lab Results  Component Value Date   HGBA1C 6.4 (H) 11/01/2019   ------------------------------------------------------------------------------------------------------------------ No results for input(s): TSH, T4TOTAL, T3FREE, THYROIDAB in the last 72 hours.  Invalid input(s): FREET3 ------------------------------------------------------------------------------------------------------------------ Recent Labs    10/31/19 0343 11/01/19 0424  FERRITIN 41 36    Coagulation profile Recent Labs  Lab 10/30/19 1100  INR 1.1    Recent Labs    10/31/19 0343 11/01/19 0424  DDIMER 1.56* 3.26*    Cardiac Enzymes No results for input(s): CKMB, TROPONINI, MYOGLOBIN in the last 168 hours.  Invalid input(s): CK ------------------------------------------------------------------------------------------------------------------    Component Value Date/Time   BNP 178.3 (H) 10/30/2019 1530    Micro Results Recent Results (from the past 240 hour(s))  Culture, blood (Routine x 2)     Status: None (Preliminary result)   Collection Time: 10/30/19 11:00 AM   Specimen: BLOOD LEFT FOREARM  Result Value Ref Range Status   Specimen Description BLOOD LEFT FOREARM  Final   Special Requests   Final    BOTTLES DRAWN AEROBIC  AND ANAEROBIC Blood Culture adequate volume   Culture   Final    NO GROWTH 1 DAY Performed at [facility], [place] [address] [place], [place] [address]    Report Status PENDING  Incomplete  SARS Coronavirus 2 by RT PCR (hospital order, performed in [facility] hospital lab) Nasopharyngeal Nasopharyngeal Swab     Status: Abnormal   Collection Time: 10/30/19 11:33 AM   Specimen: Nasopharyngeal Swab  Result Value Ref Range Status   SARS Coronavirus 2 POSITIVE (A) NEGATIVE Final    Comment: RESULT CALLED TO, READ BACK BY AND VERIFIED WITH: Lowell Bouton RN 13:40 10/30/19 ([email or web address]) (NOTE) SARS-CoV-2 target nucleic acids are DETECTED  SARS-CoV-2 RNA is generally detectable in upper respiratory specimens  during the acute phase of infection.  Positive results are indicative  of the presence of the identified virus, but do not rule out bacterial infection or co-infection with other pathogens not detected by the test.  Clinical correlation with patient history and  other diagnostic information is necessary to determine patient infection status.  The expected result is negative.  Fact Sheet for Patients:   StrictlyIdeas.no   Fact Sheet for Healthcare Providers:   BankingDealers.co.za    This test is not yet approved or cleared by the [place] FDA and  has been authorized for detection and/or diagnosis of SARS-CoV-2 by FDA under an Emergency Use Authorization (EUA).  This EUA will remain in effect (meaning this te st can be used) for the duration of  the COVID-19 declaration under Section 564(b)(1) of the Act, 21 U.S.C. section 360-bbb-3(b)(1), unless the authorization is terminated or revoked sooner.  Performed at [facility], [place] [address] [place], [place] [address]   Culture, blood (Routine x 2)     Status: None (Preliminary result)   Collection Time: 10/30/19  3:36 PM   Specimen: BLOOD  Result Value Ref Range Status    Specimen Description BLOOD RIGHT  ANTECUBITAL  Final   Special Requests   Final    BOTTLES DRAWN AEROBIC ONLY Blood Culture adequate volume   Culture   Final    NO GROWTH < 24 HOURS Performed at [facility], [address], [place], [place] [address]    Report Status PENDING  Incomplete  MRSA PCR Screening     Status: None   Collection Time: 11/01/19  5:48 AM   Specimen: Nasal Mucosa; Nasopharyngeal  Result Value Ref Range Status   MRSA by PCR NEGATIVE NEGATIVE Final    Comment:        The GeneXpert MRSA Assay (FDA approved for NASAL specimens only), is one component of a comprehensive MRSA colonization surveillance program. It is not intended to diagnose MRSA infection nor to guide or monitor treatment for MRSA infections. Performed at [facility], [place] [address] [place], [place] [address]     Radiology Reports CT ANGIO CHEST PE W OR WO CONTRAST  Result Date: 10/31/2019 CLINICAL DATA:  Fever and COVID-19 positivity with shortness of breath EXAM: CT ANGIOGRAPHY CHEST WITH CONTRAST TECHNIQUE: Multidetector CT imaging of the chest was performed using the standard protocol during bolus administration of intravenous contrast. Multiplanar CT image reconstructions and MIPs were obtained to evaluate the vascular anatomy. CONTRAST:  77mL OMNIPAQUE IOHEXOL 350 MG/ML SOLN COMPARISON:  10/30/2019 FINDINGS: Cardiovascular: Thoracic aorta demonstrates atherosclerotic calcifications without aneurysmal dilatation. The degree of opacification is somewhat limited. Stable cardiomegaly is noted. Pulmonary artery demonstrates a normal branching pattern. No definitive filling defect to suggest pulmonary embolism is noted. Coronary calcifications are noted. Mediastinum/Nodes: Thoracic inlet is within normal limits. Scattered hilar and mediastinal lymph nodes are noted slightly more prominent than that seen on the prior exam particularly in the subcarinal region. These are felt to be reactive in  nature. The esophagus as visualized is within normal limits. Lungs/Pleura: Emphysematous changes are noted in the lungs bilaterally. Patchy bilateral lower lobe infiltrates are noted right greater than left. The degree of aeration has improved although some worsening of the infiltrate is noted particularly in the superior aspect of the right lower lobe. Upper Abdomen: Upper abdomen demonstrates cirrhotic change of the liver. No other focal abnormality is seen. Musculoskeletal: Degenerative changes of the thoracic spine are noted. Review of the MIP images confirms the above findings. IMPRESSION: No evidence of pulmonary emboli. Bilateral lower lobe waxing and waning infiltrates worse on the right. No sizable effusions are seen. Reactive hilar and mediastinal adenopathy is noted. Changes of cirrhosis of the liver. Aortic Atherosclerosis (ICD10-I70.0). Electronically Signed   By: Inez Catalina M.D.   On: 10/31/2019 01:33   DG Chest Port 1 View  Result Date: 10/30/2019 CLINICAL DATA:  Shortness of breath, fever EXAM: PORTABLE CHEST 1 VIEW COMPARISON:  08/12/2019 FINDINGS: Bilateral perihilar and lower lung opacities. No significant pleural effusion. No pneumothorax. Cardiomegaly. IMPRESSION: Bilateral perihilar lower lung opacities may reflect multifocal pneumonia or edema. Cardiomegaly. Electronically Signed   By: Macy Mis M.D.   On: 10/30/2019 12:10   ECHOCARDIOGRAM COMPLETE  Result Date: 10/31/2019    ECHOCARDIOGRAM REPORT   Patient Name:   SHANIE MAUZY Esty Date of Exam: 10/31/2019 Medical Rec #:  [id number]       Height:       68.0 in Accession #:    [id number]      Weight:       270.0 lb Date of Birth:  1945/05/26        BSA:  2.322 m Patient Age:    53 years        BP:           97/64 mmHg Patient Gender: F               HR:           66 bpm. Exam Location:  Inpatient Procedure: 2D Echo Indications:    elevated troponin  History:        Patient has no prior history of Echocardiogram examinations.                  CHF, COPD; Risk Factors:Hypertension, Diabetes and Former                 Smoker. Covid + patient.  Sonographer:    Jannett Celestine RDCS (AE) Referring Phys: [id number] Mckinley Jewel  Sonographer Comments: Patient is morbidly obese. Image acquisition challenging due to patient body habitus. immobile patient. off axis apical windows IMPRESSIONS  1. Left ventricular ejection fraction, by estimation, is 55 to 60%. The left ventricle has normal function. The left ventricle has no regional wall motion abnormalities. There is mild left ventricular hypertrophy. Left ventricular diastolic parameters are consistent with Grade II diastolic dysfunction (pseudonormalization). There is the interventricular septum is flattened in systole and diastole, consistent with right ventricular pressure and volume overload.  2. Right ventricular systolic function is normal. The right ventricular size is mildly enlarged.  3. The mitral valve is normal in structure. Trivial mitral valve regurgitation. No evidence of mitral stenosis.  4. The aortic valve is normal in structure. Aortic valve regurgitation is not visualized. No aortic stenosis is present.  5. Aortic dilatation noted. There is mild dilatation of the ascending aorta measuring 39 mm.  6. The inferior vena cava is normal in size with greater than 50% respiratory variability, suggesting right atrial pressure of 3 mmHg. FINDINGS  Left Ventricle: Left ventricular ejection fraction, by estimation, is 55 to 60%. The left ventricle has normal function. The left ventricle has no regional wall motion abnormalities. The left ventricular internal cavity size was normal in size. There is  mild left ventricular hypertrophy. The interventricular septum is flattened in systole and diastole, consistent with right ventricular pressure and volume overload. Left ventricular diastolic parameters are consistent with Grade II diastolic dysfunction  (pseudonormalization). Right Ventricle:  The right ventricular size is mildly enlarged. No increase in right ventricular wall thickness. Right ventricular systolic function is normal. Left Atrium: Left atrial size was normal in size. Right Atrium: Right atrial size was normal in size. Pericardium: There is no evidence of pericardial effusion. Mitral Valve: The mitral valve is normal in structure. Normal mobility of the mitral valve leaflets. Trivial mitral valve regurgitation. No evidence of mitral valve stenosis. Tricuspid Valve: The tricuspid valve is normal in structure. Tricuspid valve regurgitation is not demonstrated. No evidence of tricuspid stenosis. Aortic Valve: The aortic valve is normal in structure. Aortic valve regurgitation is not visualized. No aortic stenosis is present. Pulmonic Valve: The pulmonic valve was normal in structure. Pulmonic valve regurgitation is not visualized. No evidence of pulmonic stenosis. Aorta: Aortic dilatation noted. There is mild dilatation of the ascending aorta measuring 39 mm. Venous: The inferior vena cava is normal in size with greater than 50% respiratory variability, suggesting right atrial pressure of 3 mmHg. IAS/Shunts: No atrial level shunt detected by color flow Doppler.  LEFT VENTRICLE PLAX 2D LVIDd:         4.56 cm  Diastology LVIDs:         3.58 cm  LV e' lateral:   9.03 cm/s LV PW:         1.24 cm  LV E/e' lateral: 7.5 LV IVS:        1.23 cm  LV e' medial:    4.68 cm/s LVOT diam:     2.35 cm  LV E/e' medial:  14.5 LV SV:         104 LV SV Index:   45 LVOT Area:     4.34 cm  RIGHT VENTRICLE RV S prime:     9.03 cm/s TAPSE (M-mode): 2.0 cm LEFT ATRIUM           Index LA diam:      3.40 cm 1.46 cm/m LA Vol (A4C): 62.9 ml 27.09 ml/m  AORTIC VALVE LVOT Vmax:   117.00 cm/s LVOT Vmean:  84.300 cm/s LVOT VTI:    0.239 m  AORTA Ao Root diam: 3.90 cm MITRAL VALVE MV Area (PHT): 3.65 cm    SHUNTS MV Decel Time: 208 msec    Systemic VTI:  0.24 m MV E velocity: 67.70 cm/s  Systemic Diam: 2.35 cm MV A  velocity: 50.10 cm/s MV E/A ratio:  1.35 Candee Furbish MD Electronically signed by Candee Furbish MD Signature Date/Time: 10/31/2019/1:53:07 PM    Final    VAS [place] LOWER EXTREMITY VENOUS (DVT)  Result Date: 10/31/2019  Lower Venous DVTStudy Indications: Swelling, and Edema.  Limitations: Poor ultrasound/tissue interface and body habitus. Comparison Study: no prior Performing Technologist: Abram Sander RVS  Examination Guidelines: A complete evaluation includes B-mode imaging, spectral Doppler, color Doppler, and power Doppler as needed of all accessible portions of each vessel. Bilateral testing is considered an integral part of a complete examination. Limited examinations for reoccurring indications may be performed as noted. The reflux portion of the exam is performed with the patient in reverse Trendelenburg.  +---------+---------------+---------+-----------+----------+--------------+  RIGHT     Compressibility Phasicity Spontaneity Properties Thrombus Aging  +---------+---------------+---------+-----------+----------+--------------+  CFV       Full            Yes       Yes                                    +---------+---------------+---------+-----------+----------+--------------+  SFJ       Full                                                             +---------+---------------+---------+-----------+----------+--------------+  FV Prox   Full                                                             +---------+---------------+---------+-----------+----------+--------------+  FV Mid    Full                                                             +---------+---------------+---------+-----------+----------+--------------+  FV Distal                                                  Not visualized  +---------+---------------+---------+-----------+----------+--------------+  PFV       Full                                                              +---------+---------------+---------+-----------+----------+--------------+  POP       Full            Yes       Yes                                    +---------+---------------+---------+-----------+----------+--------------+  PTV                                                        Not visualized  +---------+---------------+---------+-----------+----------+--------------+  PERO                                                       Not visualized  +---------+---------------+---------+-----------+----------+--------------+   +---------+---------------+---------+-----------+----------+--------------+  LEFT      Compressibility Phasicity Spontaneity Properties Thrombus Aging  +---------+---------------+---------+-----------+----------+--------------+  CFV       Full            Yes       Yes                                    +---------+---------------+---------+-----------+----------+--------------+  SFJ       Full                                                             +---------+---------------+---------+-----------+----------+--------------+  FV Prox   Full                                                             +---------+---------------+---------+-----------+----------+--------------+  FV Mid                                                     Not visualized  +---------+---------------+---------+-----------+----------+--------------+  FV Distal  Not visualized  +---------+---------------+---------+-----------+----------+--------------+  PFV       Full                                                             +---------+---------------+---------+-----------+----------+--------------+  POP       Full            Yes       Yes                                    +---------+---------------+---------+-----------+----------+--------------+  PTV                                                        Not visualized   +---------+---------------+---------+-----------+----------+--------------+  PERO                                                       Not visualized  +---------+---------------+---------+-----------+----------+--------------+     Summary: BILATERAL: -No evidence of popliteal cyst, bilaterally. RIGHT: - There is no evidence of deep vein thrombosis in the lower extremity. However, portions of this examination were limited- see technologist comments above.  LEFT: - There is no evidence of deep vein thrombosis in the lower extremity. However, portions of this examination were limited- see technologist comments above.  *See table(s) above for measurements and observations. Electronically signed by Deitra Mayo MD on 10/31/2019 at 2:50:09 PM.    Final

## 2019-11-01 NOTE — Progress Notes (Signed)
Inpatient Diabetes Program Recommendations  AACE/ADA: New Consensus Statement on Inpatient Glycemic Control (2015)  Target Ranges:  Prepandial:   less than 140 mg/dL      Peak postprandial:   less than 180 mg/dL (1-2 hours)      Critically ill patients:  140 - 180 mg/dL   Lab Results  Component Value Date   GLUCAP 215 (H) 10/31/2019   HGBA1C 6.4 (H) 11/01/2019    Review of Glycemic Control Results for Cassandra Barnes, Cassandra Barnes (MRN [id number]) as of 11/01/2019 09:38  Ref. Range 10/31/2019 08:05 10/31/2019 11:25 10/31/2019 15:53 10/31/2019 21:19  Glucose-Capillary Latest Ref Range: 70 - 99 mg/dL 263 (H) 296 (H) 293 (H) 215 (H)   Diabetes history: Type 2 DM Outpatient Diabetes medications: Novolin R 8 units TID, Lantus 45 units BID Current orders for Inpatient glycemic control: Lantus 45 units BID, Novolog 8 units TID, Novolog 0-20 units TID, Novolog 0-5 units QHS Solumedrol 40 mg BID  Inpatient Diabetes Program Recommendations:    Consider: - increasing Lantus to 50 units BID - Increasing Novolog 10 units TID (assuming patient is consuming >50% of meal).  - Tradjenta 5 mg QD    Thanks, Bronson Curb, MSN, RNC-OB Diabetes Coordinator [phone] (8a-5p)

## 2019-11-01 NOTE — Progress Notes (Signed)
Occupational Therapy Evaluation Patient Details Name: Cassandra Barnes MRN: [id number] DOB: [birth year] Today's Date: 11/01/2019    History of Present Illness  Cassandra Barnes is a 74 y.o. female with medical history significant for chronic hypoxemic respiratory failure secondary to underlying COPD-on 2 L of oxygen via nasal cannula, hypertension, diabetes, CHF, morbid obesity, gout, GERD, chronic pressure ulcers on heel, bedridden, Covid pneumonia presents to emergency department with worsening shortness of breath since 1 week and confusion started yesterday and her symptoms got worse this morning therefore family called EMS and brought patient to the emergency department for further evaluation and management.   Clinical Impression   Patient lives at home with husband and daughter.  Daughter is primary caregiver and assist patient with all ADLs.  Patient has been bedbound since ~May of 2020, but recently working with [facility] and getting stronger.  Patient and daughter agreeable for SNF at discharge to continue work on getting stronger and more independent.  Today patient able to sit up at EOB with max encouragement and mod assist.  Patient stated she felt good sitting up and was able to maintain balance with min guard-min assist.  Right now patient able to complete UB ADLs with min assist and LB with max/total.  Will continue to follow with OT acutely to address the deficits listed below.      Follow Up Recommendations  SNF;Supervision - Intermittent    Equipment Recommendations  Other (comment) (defer to next venue)    Recommendations for Other Services       Precautions / Restrictions Precautions Precautions: Fall Precaution Comments: bedridden Restrictions Weight Bearing Restrictions: No      Mobility Bed Mobility Overal bed mobility: Needs Assistance Bed Mobility: Supine to Sit;Sit to Supine     Supine to sit: Mod assist;HOB elevated Sit to supine: Mod assist;HOB elevated    General bed mobility comments: Assist with moving legs over, patient able to pull torso up with rail and needed assist scooting around  Transfers                 General transfer comment: did not attempt    Balance Overall balance assessment: Needs assistance Sitting-balance support: Single extremity supported;Feet supported Sitting balance-Leahy Scale: Fair Sitting balance - Comments: maintained balance with min guard-min assist                                   ADL either performed or assessed with clinical judgement   ADL Overall ADL's : Needs assistance/impaired Eating/Feeding: Set up;Bed level Eating/Feeding Details (indicate cue type and reason): needs food cut and packages opened Grooming: Set up;Bed level   Upper Body Bathing: Minimal assistance;Bed level   Lower Body Bathing: Maximal assistance;Bed level   Upper Body Dressing : Minimal assistance;Bed level   Lower Body Dressing: Maximal assistance;Bed level               Functional mobility during ADLs: Moderate assistance (bed mob)       Vision         Perception     Praxis      Pertinent Vitals/Pain Pain Assessment: Faces Faces Pain Scale: Hurts little more Pain Location: feet/heels, legs and general Pain Descriptors / Indicators: Discomfort;Grimacing;Guarding Pain Intervention(s): Monitored during session;Repositioned     Hand Dominance Right   Extremity/Trunk Assessment Upper Extremity Assessment Upper Extremity Assessment: Generalized weakness;RUE deficits/detail;LUE deficits/detail RUE Deficits / Details: Difficulty with  fine motor tasks RUE Coordination: decreased fine motor LUE Deficits / Details: Difficulty with fine motor tasks LUE Coordination: decreased fine motor   Lower Extremity Assessment Lower Extremity Assessment: Defer to PT evaluation       Communication Communication Communication: No difficulties   Cognition Arousal/Alertness:  Awake/alert Behavior During Therapy: WFL for tasks assessed/performed Overall Cognitive Status: No family/caregiver present to determine baseline cognitive functioning                                 General Comments: Follows commands consistently, able to answer all questions.  Processing is a little slow and decreased problem solving skills. Stated she felt like she wasn't thinking right.  Oriented     General Comments  On 2L O2 and SpO2 93 at rest, 88/89 with sitting up    Exercises Exercises: Other exercises Other Exercises Other Exercises: x5 long supine <> long sit with HOB elevated and usingrails to pull up   Shoulder Instructions      Home Living Family/patient expects to be discharged to:: Private residence Living Arrangements: Spouse/significant other Available Help at Discharge: Family;Available 24 hours/day (spouse and daughter) Type of Home: House Home Access: Ramped entrance     Home Layout: One level               Home Equipment: Hospital bed;Other (comment) (hoyer)   Additional Comments: pt in bed unles to MD appt, etc.      Prior Functioning/Environment Level of Independence: Needs assistance  Gait / Transfers Assistance Needed: bedridden since before COVID ADL's / Homemaking Assistance Needed: assisted for all ADL's, prepped for eating. bed baths            OT Problem List: Decreased strength;Decreased activity tolerance;Impaired balance (sitting and/or standing);Decreased coordination;Cardiopulmonary status limiting activity;Obesity;Pain      OT Treatment/Interventions: Self-care/ADL training;Therapeutic exercise;Energy conservation;Therapeutic activities;Patient/family education;Balance training    OT Goals(Current goals can be found in the care plan section) Acute Rehab OT Goals Patient Stated Goal: get stronger at rehab OT Goal Formulation: With patient Time For Goal Achievement: 11/15/19 Potential to Achieve Goals: Good  OT  Frequency: Min 2X/week   Barriers to D/C:            Co-evaluation              AM-PAC OT "6 Clicks" Daily Activity     Outcome Measure Help from another person eating meals?: A Little Help from another person taking care of personal grooming?: A Little Help from another person toileting, which includes using toliet, bedpan, or urinal?: Total Help from another person bathing (including washing, rinsing, drying)?: A Lot Help from another person to put on and taking off regular upper body clothing?: A Little Help from another person to put on and taking off regular lower body clothing?: A Lot 6 Click Score: 14   End of Session Equipment Utilized During Treatment: Oxygen Nurse Communication: Mobility status  Activity Tolerance: Patient tolerated treatment well Patient left: in bed;with call bell/phone within reach;with bed alarm set  OT Visit Diagnosis: Muscle weakness (generalized) (M62.81);Unsteadiness on feet (R26.81);Pain Pain - part of body: Ankle and joints of foot                Time: 0932-6712 OT Time Calculation (min): 32 min Charges:  OT General Charges $OT Visit: 1 Visit OT Evaluation $OT Eval Moderate Complexity: 1 Mod OT Treatments $Therapeutic Activity: 8-22 mins  August Luz, OTR/L  Phylliss Bob 11/01/2019, 9:47 AM

## 2019-11-01 NOTE — NC FL2 (Addendum)
[place] LEVEL OF CARE SCREENING TOOL     IDENTIFICATION  Patient Name: Cassandra Barnes Birthdate: [birth year] Sex: female Admission Date (Current Location): 10/30/2019  [facility] and [place] Number:  [employer] and Address:  [facility], [place] [address], [place], [place] [address]      Provider Number: [id number]  Attending Physician Name and Address:  Jonetta Osgood, MD  Relative Name and Phone Number:  Tessie Fass, daughter, [phone]    Current Level of Care: Hospital Recommended Level of Care: [place] Prior Approval Number:    Date Approved/Denied:   PASRR Number: [id number]  Discharge Plan: SNF    Current Diagnoses: Patient Active Problem List   Diagnosis Date Noted  . Hypertension   . Sepsis due to COVID-19 [facility])   . AKI (acute kidney injury) ([place])   . Diabetes mellitus without complication ([place])   . CHF (congestive heart failure) ([place])   . Normocytic anemia   . COPD (chronic obstructive pulmonary disease) ([facility])     Orientation RESPIRATION BLADDER Height & Weight     Self, Time, Situation, Place  O2 (2L Nasal cannula) Incontinent, External catheter Weight: (!) 276 lb 10.8 oz (125.5 kg) Height:  5\' 8"  (172.7 cm)  BEHAVIORAL SYMPTOMS/MOOD NEUROLOGICAL BOWEL NUTRITION STATUS      Continent Diet (Please see DC Summary)  AMBULATORY STATUS COMMUNICATION OF NEEDS Skin   Extensive Assist Verbally PU Stage and Appropriate Care (Unstageable on heel)                       Personal Care Assistance Level of Assistance  Bathing, Feeding, Dressing Bathing Assistance: Limited assistance Feeding assistance: Limited assistance Dressing Assistance: Limited assistance     Functional Limitations Info  Sight, Hearing, Speech Sight Info: Adequate Hearing Info: Adequate Speech Info: Adequate    SPECIAL CARE FACTORS FREQUENCY  PT (By licensed PT), OT (By licensed OT)     PT Frequency: 5x/week OT  Frequency: 5x/week            Contractures Contractures Info: Not present    Additional Factors Info  Isolation Precautions, Insulin Sliding Scale Code Status Info: Full Allergies Info: Codeine, Hydrocodone   Insulin Sliding Scale Info: See DC Summary Isolation Precautions Info: COVID+     Current Medications (11/01/2019):  This is the current hospital active medication list Current Facility-Administered Medications  Medication Dose Route Frequency Provider Last Rate Last Admin  . acetaminophen (TYLENOL) tablet 650 mg  650 mg Oral Q6H PRN Pahwani, Rinka R, MD      . albuterol (VENTOLIN HFA) 108 (90 Base) MCG/ACT inhaler 2 puff  2 puff Inhalation Q6H Pahwani, Rinka R, MD   2 puff at 11/01/19 0757  . albuterol (VENTOLIN HFA) 108 (90 Base) MCG/ACT inhaler 2 puff  2 puff Inhalation Q4H PRN Ghimire, Henreitta Leber, MD      . ascorbic acid (VITAMIN C) tablet 500 mg  500 mg Oral Daily Pahwani, Rinka R, MD   500 mg at 11/01/19 1013  . aspirin EC tablet 81 mg  81 mg Oral Daily Jonetta Osgood, MD   81 mg at 11/01/19 1013  . azithromycin (ZITHROMAX) 500 mg in sodium chloride 0.9 % 250 mL IVPB  500 mg Intravenous Q24H Pahwani, Michell Heinrich, MD   Stopped at 10/31/19 1511  . cefTRIAXone (ROCEPHIN) 1 g in sodium chloride 0.9 % 100 mL IVPB  1 g Intravenous Q24H Pahwani, Rinka R,  MD   Paused at 10/31/19 1646  . Chlorhexidine Gluconate Cloth 2 % PADS 6 each  6 each Topical Daily Pahwani, Rinka R, MD   6 each at 11/01/19 1014  . colchicine tablet 0.6 mg  0.6 mg Oral Daily Jonetta Osgood, MD   0.6 mg at 11/01/19 1013  . collagenase (SANTYL) ointment   Topical Daily Jonetta Osgood, MD   Given at 11/01/19 1015  . enoxaparin (LOVENOX) injection 40 mg  40 mg Subcutaneous Q24H Jonetta Osgood, MD   40 mg at 11/01/19 1014  . ferrous sulfate tablet 325 mg  325 mg Oral Q breakfast Jonetta Osgood, MD   325 mg at 11/01/19 1013  . furosemide (LASIX) injection 40 mg  40 mg Intravenous Daily Ghimire, Henreitta Leber, MD      . gabapentin (NEURONTIN) tablet 600 mg  600 mg Oral BID Jonetta Osgood, MD   600 mg at 11/01/19 1013  . guaiFENesin-dextromethorphan (ROBITUSSIN DM) 100-10 MG/5ML syrup 10 mL  10 mL Oral Q4H PRN Pahwani, Rinka R, MD   10 mL at 10/31/19 2133  . insulin aspart (novoLOG) injection 0-20 Units  0-20 Units Subcutaneous TID WC Jonetta Osgood, MD   7 Units at 11/01/19 1319  . insulin aspart (novoLOG) injection 0-5 Units  0-5 Units Subcutaneous QHS Jonetta Osgood, MD   2 Units at 10/31/19 2159  . insulin aspart (novoLOG) injection 10 Units  10 Units Subcutaneous TID WC Ghimire, Shanker M, MD      . insulin glargine (LANTUS) injection 50 Units  50 Units Subcutaneous BID Ghimire, Shanker M, MD      . ipratropium-albuterol (DUONEB) 0.5-2.5 (3) MG/3ML nebulizer solution 3 mL  3 mL Nebulization Q4H PRN Pahwani, Rinka R, MD      . MEDLINE mouth rinse  15 mL Mouth Rinse BID Jonetta Osgood, MD   15 mL at 11/01/19 1016  . methylPREDNISolone sodium succinate (SOLU-MEDROL) 40 mg/mL injection 40 mg  40 mg Intravenous Q12H Jonetta Osgood, MD   40 mg at 11/01/19 1014  . midodrine (PROAMATINE) tablet 10 mg  10 mg Oral TID WC Jonetta Osgood, MD   10 mg at 11/01/19 1320  . ondansetron (ZOFRAN) tablet 4 mg  4 mg Oral Q6H PRN Pahwani, Rinka R, MD       Or  . ondansetron (ZOFRAN) injection 4 mg  4 mg Intravenous Q6H PRN Pahwani, Rinka R, MD   4 mg at 11/01/19 0235  . pantoprazole (PROTONIX) EC tablet 40 mg  40 mg Oral BID Jonetta Osgood, MD   40 mg at 11/01/19 1013  . remdesivir 100 mg in sodium chloride 0.9 % 100 mL IVPB  100 mg Intravenous Daily Sloan Leiter B, RPH 200 mL/hr at 11/01/19 1033 100 mg at 11/01/19 1033  . umeclidinium bromide (INCRUSE ELLIPTA) 62.5 MCG/INH 1 puff  1 puff Inhalation Daily Jonetta Osgood, MD   1 puff at 11/01/19 0756  . zinc sulfate capsule 220 mg  220 mg Oral Daily Pahwani, Rinka R, MD   220 mg at 11/01/19 1013     Discharge Medications: Please  see discharge summary for a list of discharge medications.  Relevant Imaging Results:  Relevant Lab Results:   Additional Information SSN: [id number]  [facility] [place], [place]

## 2019-11-02 LAB — D-DIMER, QUANTITATIVE: D-Dimer, Quant: 2.42 ug/mL-FEU — ABNORMAL HIGH (ref 0.00–0.50)

## 2019-11-02 LAB — COMPREHENSIVE METABOLIC PANEL
ALT: 13 U/L (ref 0–44)
AST: 15 U/L (ref 15–41)
Albumin: 2.8 g/dL — ABNORMAL LOW (ref 3.5–5.0)
Alkaline Phosphatase: 49 U/L (ref 38–126)
Anion gap: 9 (ref 5–15)
BUN: 37 mg/dL — ABNORMAL HIGH (ref 8–23)
CO2: 33 mmol/L — ABNORMAL HIGH (ref 22–32)
Calcium: 8.6 mg/dL — ABNORMAL LOW (ref 8.9–10.3)
Chloride: 94 mmol/L — ABNORMAL LOW (ref 98–111)
Creatinine, Ser: 0.92 mg/dL (ref 0.44–1.00)
GFR calc Af Amer: >60 mL/min (ref >60)
GFR calc non Af Amer: >60 mL/min (ref >60)
Glucose, Bld: 208 mg/dL — ABNORMAL HIGH (ref 70–99)
Potassium: 4.8 mmol/L (ref 3.5–5.1)
Sodium: 136 mmol/L (ref 135–145)
Total Bilirubin: 0.3 mg/dL (ref 0.3–1.2)
Total Protein: 6.4 g/dL — ABNORMAL LOW (ref 6.5–8.1)

## 2019-11-02 LAB — CBC WITH DIFFERENTIAL/PLATELET
Abs Immature Granulocytes: 0.14 10*3/uL — ABNORMAL HIGH (ref 0.00–0.07)
Basophils Absolute: 0 10*3/uL (ref 0.0–0.1)
Basophils Relative: 0 %
Eosinophils Absolute: 0 10*3/uL (ref 0.0–0.5)
Eosinophils Relative: 0 %
HCT: 28.5 % — ABNORMAL LOW (ref 36.0–46.0)
Hemoglobin: 7.9 g/dL — ABNORMAL LOW (ref 12.0–15.0)
Immature Granulocytes: 2 %
Lymphocytes Relative: 21 %
Lymphs Abs: 1.4 10*3/uL (ref 0.7–4.0)
MCH: 27.1 pg (ref 26.0–34.0)
MCHC: 27.7 g/dL — ABNORMAL LOW (ref 30.0–36.0)
MCV: 97.6 fL (ref 80.0–100.0)
Monocytes Absolute: 0.3 10*3/uL (ref 0.1–1.0)
Monocytes Relative: 4 %
Neutro Abs: 5 10*3/uL (ref 1.7–7.7)
Neutrophils Relative %: 73 %
Platelets: 201 10*3/uL (ref 150–400)
RBC: 2.92 MIL/uL — ABNORMAL LOW (ref 3.87–5.11)
RDW: 14.7 % (ref 11.5–15.5)
WBC: 6.9 10*3/uL (ref 4.0–10.5)
nRBC: 0 % (ref 0.0–0.2)

## 2019-11-02 LAB — GLUCOSE, CAPILLARY
Glucose-Capillary: 200 mg/dL — ABNORMAL HIGH (ref 70–99)
Glucose-Capillary: 246 mg/dL — ABNORMAL HIGH (ref 70–99)
Glucose-Capillary: 222 mg/dL — ABNORMAL HIGH (ref 70–99)
Glucose-Capillary: 216 mg/dL — ABNORMAL HIGH (ref 70–99)

## 2019-11-02 LAB — MAGNESIUM: Magnesium: 2.4 mg/dL (ref 1.7–2.4)

## 2019-11-02 LAB — FERRITIN: Ferritin: 30 ng/mL (ref 11–307)

## 2019-11-02 LAB — C-REACTIVE PROTEIN: CRP: 2.8 mg/dL — ABNORMAL HIGH (ref <1.0)

## 2019-11-02 MED ORDER — MIDODRINE HCL 5 MG PO TABS
5.0000 mg | ORAL_TABLET | Freq: Three times a day (TID) | ORAL | Status: DC
  Administered 2019-11-02: 5 mg via ORAL
  Filled 2019-11-02 (×2): qty 1

## 2019-11-02 MED ORDER — METHYLPREDNISOLONE SODIUM SUCC 40 MG IJ SOLR
30.0000 mg | Freq: Two times a day (BID) | INTRAMUSCULAR | Status: DC
  Administered 2019-11-02 – 2019-11-03 (×2): 30 mg via INTRAVENOUS
  Filled 2019-11-02 (×2): qty 1

## 2019-11-02 NOTE — TOC Progression Note (Addendum)
Transition of Care [facility]) - Progression Note    Patient Details  Name: [name] MRN: [id number] Date of Birth: [birth year]  Transition of Care [facility]) CM/SW [place], LCSW Phone Number: 11/02/2019, 12:13 PM  Clinical Narrative:    12pm-CSW spoke with patient's daughter regarding SNF decision. She states that patient is willing to go to [facility] or [facility]. CSW spoke with patient to confirm, but she stated she was not sure she wanted to be so far away from her family. CSW made her aware that she will be in quarantine and family will not be able to visit inside, but she would like to call her husband and pastor to check with them before deciding. CSW will check back this afternoon. Paged PT to see if they can change recommendation to SNF for insurance.   6pm-CSW spoke with patient again. She stated she spoke with her family and she said they told her they would bring her home if she did not like the SNF. She reported still feeling anxious about going. CSW provided supportive listening; patient reported appreciation. CSW will follow up tomorrow and start insurance authorization if patient is medically ready this weekend.      Barriers to Discharge: Continued Medical Work up  Expected Discharge Plan and Services   In-house Referral: Clinical Social Work     Living arrangements for the past 2 months: Single Family Home                                       Social Determinants of Health (SDOH) Interventions    Readmission Risk Interventions No flowsheet data found.

## 2019-11-02 NOTE — Progress Notes (Signed)
PROGRESS NOTE                                                                                                                                                                                                             Patient Demographics:    Cassandra Barnes, is a 74 y.o. female, DOB - January 09, 1946, ZCH:[id number]  Outpatient Primary MD for the patient is Cher Nakai, MD   Admit date - 10/30/2019   LOS - 3  Chief Complaint  Patient presents with  . Shortness of Breath  . Fever       Brief Narrative: Patient is a 74 y.o. female with PMHx of COVID-19 infection sometime in May 2021-( hospitalized at [facility]), COPD on 2 L of oxygen at home, chronic diastolic heart failure, HTN, DM-2, bedridden with chronic heel ulcers-presented with shortness of breath, fever along with confusion-found to have hypercarbia, possible COVID-19 pneumonitis-and subsequently admitted to the hospitalist service.  See below for further details.  Significant Events: 7/26>> admit to [facility] for confusion/hypercarbia/COVID-19 pneumonia 7/26>> transfer to 30M-as patient developed lethargy-ABG with hypercarbia-briefly required BiPAP. 7/27>> liberated off BiPAP-awake/alert -transfer to 5W  Significant studies: 7/27>> echo: EF 28-78%, grade 2 diastolic dysfunction.  Interventricular septum flattened-consistent with volume overload. 7/27>> CTA chest: No PE, bilateral lower lobe infiltrates worse on the right, changes of cirrhosis of the liver 7/27>> lower extremity Doppler: No DVT  COVID-19 medications: Steroids: 7/26>> Remdesivir: 7/27  Antibiotics: Rocephin: 7/26>> Zithromax: 7/26>>7/29  Microbiology data: 7/26: Blood culture>> negative so far  Procedures: None  Consults: None  DVT prophylaxis: enoxaparin (LOVENOX) injection 40 mg Start: 11/01/19 1000 SCDs Start: 10/30/19 1429    Subjective:   Lying comfortably in bed-no major issues overnight.   Stable on just 2 L of oxygen which is her baseline.   Assessment  & Plan :   Sepsis secondary to pneumonia: Sepsis physiology has significantly improved-suspect pneumonia mostly from bacterial pneumonia-not sure if patient has COVID-19 pneumonia at this point-she has a history of having COVID-19 pneumonia in May 2021 requiring hospitalization in [facility] in [place].  Continue Rocephin, Remdesivir-and steroids-but will start tapering steroids.   Fever: afebrile O2 requirements:  SpO2: 93 % O2 Flow Rate (L/min): 2 L/min FiO2 (%): 40 %   COVID-19 Labs: Recent Labs    10/30/19 1530 10/30/19 1530 10/31/19 0343 11/01/19 0424 11/02/19  0402  DDIMER 3.56*   < > 1.56* 3.26* 2.42*  FERRITIN 24   < > 41 36 30  LDH 131  --   --   --   --   CRP 4.6*   < > 10.2* 7.2* 2.8*   < > = values in this interval not displayed.       Component Value Date/Time   BNP 178.3 (H) 10/30/2019 1530    Recent Labs  Lab 10/30/19 1530  PROCALCITON 0.49    Lab Results  Component Value Date   SARSCOV2NAA POSITIVE (A) 10/30/2019     Prone/Incentive Spirometry:  incentive spirometry use 3-4/hour.  Acute metabolic encephalopathy: Likely secondary to hypercarbia-resolved-she is completely awake and alert-she briefly required BiPAP-however no BiPAP requirement over the past 2-3 days  Acute on chronic hypoxic/hypercarbic respiratory failure: Suspect multifactorial from pneumonia and possible decompensated diastolic heart failure.  Continue IV Lasix and IV antimicrobial therapy.  Acute on chronic diastolic heart failure: Volume overloaded-but bedbound and with hypoalbuminemia-however echo also shows evidence of volume overload.  Possible undiagnosed cirrhosis on CT imaging.  Blood pressure remains stable with midodrine-continue IV Lasix-keep in negative balance.  Follow.   COPD with chronic hypoxic/hypercarbic respiratory failure: Does not appear to be in exacerbation-no wheezing-but remains at  risk for hypercarbia--continue bronchodilators-avoid/minimize narcotics/benzos as much as possible.  AKI: Appears to be mild-likely hemodynamically mediated-improved with supportive care-remains volume overloaded-continue with diuretics cautiously.  Hypotension: BP much stable-continue with midodrine-decrease in dose to 5 mg.  Hypotension probably related to sepsis physiology-no indication of volume loss or cardiogenic shock.  Hyperkalemia: Resolved with Lokelma and Lasix.  HTN: Hold all antihypertensives due to hypotension-patient on midodrine.  DM-2: CBGs on the higher side due to steroids-continue Lantus 15 units twice daily, 10 units of NovoLog with meals and SSI-steroids being tapered down-suspect CBGs will improve over the next few days.   Recent Labs    11/01/19 2126 11/02/19 0746 11/02/19 1145  GLUCAP 215* 200* 246*   CT chest evidence of evidence of cirrhosis: Stable for outpatient follow-up-we will be on diuretics in any event.  Peripheral neuropathy: Continue Neurontin  Bilateral lower extremity heel ulcers: Chronic issue-appreciate wound care input.  Bedbound with significant debility/deconditioning at baseline for more than 1 year  Palliative care discussion: Bedbound for more than a year-COPD with chronic hypoxemia/hypercarbia on 2 L of oxygen at home-with encephalopathy and worsening respiratory failure from pneumonia/CHF.  Discussed with patient at length regarding CODE STATUS-after discussion with family-she remains a full code.  Overall long-term prognosis is poor given extensive medical comorbidities and bedbound status.  Morbid Obesity: Estimated body mass index is 42.07 kg/m as calculated from the following:   Height as of this encounter: 5\' 8"  (1.727 m).   Weight as of this encounter: 125.5 kg.   RN pressure injury documentation: Pressure Injury 10/30/19 Heel Right;Left Unstageable - Full thickness tissue loss in which the base of the injury is covered by  slough (yellow, tan, gray, green or brown) and/or eschar (tan, brown or black) in the wound bed. (Active)  10/30/19   Location: Heel  Location Orientation: Right;Left  Staging: Unstageable - Full thickness tissue loss in which the base of the injury is covered by slough (yellow, tan, gray, green or brown) and/or eschar (tan, brown or black) in the wound bed.  Wound Description (Comments):   Present on Admission: Yes    GI prophylaxis: PPI  ABG:    Component Value Date/Time   PHART 7.320 (L) 10/31/2019  0207   PCO2ART 74.9 (HH) 10/31/2019 0207   PO2ART 96 10/31/2019 0207   HCO3 38.7 (H) 10/31/2019 0207   TCO2 41 (H) 10/31/2019 0207   O2SAT 96.0 10/31/2019 0207    Vent Settings:     Condition - Guarded  Family Communication  :  Daughter updated over the phone on 7/29  Code Status :  Full Code  Diet :  Diet Order            Diet Carb Modified Fluid consistency: Thin; Room service appropriate? Yes with Assist; Fluid restriction: 1500 mL Fluid  Diet effective now                  Disposition Plan  :  Status is: Inpatient  Remains inpatient appropriate because:Inpatient level of care appropriate due to severity of illness   Dispo: The patient is from: Home              Anticipated d/c is to: Home              Anticipated d/c date is: > 3 days              Patient currently is not medically stable to d/c.   Barriers to discharge: Hypoxia requiring O2 supplementation/complete 5 days of IV Remdesivir  Antimicorbials  :    Anti-infectives (From admission, onward)   Start     Dose/Rate Route Frequency Ordered Stop   11/01/19 1000  remdesivir 100 mg in sodium chloride 0.9 % 100 mL IVPB     Discontinue     100 mg 200 mL/hr over 30 Minutes Intravenous Daily 10/31/19 0749 11/05/19 0959   10/31/19 1400  vancomycin (VANCOREADY) IVPB 1500 mg/300 mL  Status:  Discontinued        1,500 mg 150 mL/hr over 120 Minutes Intravenous Every 24 hours 10/30/19 1311 10/30/19 1457    10/31/19 0830  remdesivir 200 mg in sodium chloride 0.9% 250 mL IVPB        200 mg 580 mL/hr over 30 Minutes Intravenous Once 10/31/19 0749 10/31/19 1730   10/30/19 1730  piperacillin-tazobactam (ZOSYN) IVPB 3.375 g  Status:  Discontinued        3.375 g 12.5 mL/hr over 240 Minutes Intravenous Every 8 hours 10/30/19 1311 10/30/19 1457   10/30/19 1600  cefTRIAXone (ROCEPHIN) 1 g in sodium chloride 0.9 % 100 mL IVPB     Discontinue     1 g 200 mL/hr over 30 Minutes Intravenous Every 24 hours 10/30/19 1432 11/04/19 1559   10/30/19 1530  azithromycin (ZITHROMAX) 500 mg in sodium chloride 0.9 % 250 mL IVPB        500 mg 250 mL/hr over 60 Minutes Intravenous Every 24 hours 10/30/19 1432 11/02/19 1143   10/30/19 1200  vancomycin (VANCOCIN) 2,500 mg in sodium chloride 0.9 % 500 mL IVPB        2,500 mg 250 mL/hr over 120 Minutes Intravenous  Once 10/30/19 1148 10/30/19 1648   10/30/19 1130  piperacillin-tazobactam (ZOSYN) IVPB 3.375 g        3.375 g 100 mL/hr over 30 Minutes Intravenous  Once 10/30/19 1116 10/30/19 1516      Inpatient Medications  Scheduled Meds: . albuterol  2 puff Inhalation Q6H  . vitamin C  500 mg Oral Daily  . aspirin EC  81 mg Oral Daily  . Chlorhexidine Gluconate Cloth  6 each Topical Daily  . colchicine  0.6 mg Oral Daily  . collagenase  Topical Daily  . enoxaparin (LOVENOX) injection  40 mg Subcutaneous Q24H  . ferrous sulfate  325 mg Oral Q breakfast  . furosemide  40 mg Intravenous Daily  . gabapentin  600 mg Oral BID  . insulin aspart  0-20 Units Subcutaneous TID WC  . insulin aspart  0-5 Units Subcutaneous QHS  . insulin aspart  10 Units Subcutaneous TID WC  . insulin glargine  50 Units Subcutaneous BID  . mouth rinse  15 mL Mouth Rinse BID  . methylPREDNISolone (SOLU-MEDROL) injection  40 mg Intravenous Q12H  . midodrine  10 mg Oral TID WC  . pantoprazole  40 mg Oral BID  . umeclidinium bromide  1 puff Inhalation Daily  . zinc sulfate  220 mg Oral  Daily   Continuous Infusions: . cefTRIAXone (ROCEPHIN)  IV 1 g (11/01/19 1720)  . remdesivir 100 mg in NS 100 mL 100 mg (11/02/19 0943)   PRN Meds:.acetaminophen, albuterol, guaiFENesin-dextromethorphan, ipratropium-albuterol, ondansetron **OR** ondansetron (ZOFRAN) IV   Time Spent in minutes  25    See all Orders from today for further details   Oren Binet M.D on 11/02/2019 at 2:45 PM  To page go to [email or web address] - use universal password  [facility] -  Office  [phone]    Objective:   Vitals:   11/01/19 2346 11/02/19 0348 11/02/19 0500 11/02/19 1225  BP:  (!) 136/58    Pulse:  61    Resp: 13 16    Temp:  98.3 F (36.8 C)  97.8 F (36.6 C)  TempSrc:  Oral  Axillary  SpO2:  93%    Weight:   (!) 125.5 kg   Height:        Wt Readings from Last 3 Encounters:  11/02/19 (!) 125.5 kg  02/10/14 122.5 kg     Intake/Output Summary (Last 24 hours) at 11/02/2019 1445 Last data filed at 11/02/2019 1332 Gross per 24 hour  Intake 1080 ml  Output 2250 ml  Net -1170 ml     Physical Exam Gen Exam:Alert awake-not in any distress HEENT:atraumatic, normocephalic Chest: B/L clear to auscultation anteriorly CVS:S1S2 regular Abdomen:soft non tender, non distended Extremities:+ edema Neurology: Non focal Skin: no rash   Data Review:    CBC Recent Labs  Lab 10/30/19 1100 10/30/19 1100 10/30/19 1530 10/31/19 0207 10/31/19 0343 11/01/19 0424 11/02/19 0402  WBC 14.2*  --  13.4*  --  10.7* 8.8 6.9  HGB 8.1*   < > 8.0* 9.2* 9.2* 8.2* 7.9*  HCT 29.4*   < > 29.2* 27.0* 33.0* 28.7* 28.5*  PLT 182  --  177  --  172 185 201  MCV 98.3  --  100.0  --  100.0 96.6 97.6  MCH 27.1  --  27.4  --  27.9 27.6 27.1  MCHC 27.6*  --  27.4*  --  27.9* 28.6* 27.7*  RDW 14.7  --  14.7  --  14.7 14.6 14.7  LYMPHSABS 1.3  --   --   --  1.4 1.4 1.4  MONOABS 0.8  --   --   --  0.2 0.3 0.3  EOSABS 0.0  --   --   --  0.0 0.0 0.0  BASOSABS 0.0  --   --   --  0.0 0.0 0.0   <  > = values in this interval not displayed.    [place]  Lab 10/30/19 1100 10/30/19 1530 10/31/19 0207 10/31/19 0343 11/01/19 0424 11/02/19 0402  NA 136  --  134* 137 135 136  K 4.6  --  5.4* 5.8* 5.1 4.8  CL 92*  --   --  97* 93* 94*  CO2 37*  --   --  27 33* 33*  GLUCOSE 109*  --   --  266* 195* 208*  BUN 16  --   --  26* 34* 37*  CREATININE 1.23* 1.42*  --  1.27* 0.83 0.92  CALCIUM 9.2  --   --  8.7* 8.5* 8.6*  MG  --   --   --  1.9 2.1 2.4  AST 26  --   --  27 19 15   ALT 17  --   --  17 15 13   ALKPHOS 64  --   --  61 56 49  BILITOT 0.5  --   --  0.5 0.3 0.3   ------------------------------------------------------------------------------------------------------------------ No results for input(s): CHOL, HDL, LDLCALC, TRIG, CHOLHDL, LDLDIRECT in the last 72 hours.  Lab Results  Component Value Date   HGBA1C 6.4 (H) 11/01/2019   ------------------------------------------------------------------------------------------------------------------ No results for input(s): TSH, T4TOTAL, T3FREE, THYROIDAB in the last 72 hours.  Invalid input(s): FREET3 ------------------------------------------------------------------------------------------------------------------ Recent Labs    11/01/19 0424 11/02/19 0402  FERRITIN 36 30    Coagulation profile Recent Labs  Lab 10/30/19 1100  INR 1.1    Recent Labs    11/01/19 0424 11/02/19 0402  DDIMER 3.26* 2.42*    Cardiac Enzymes No results for input(s): CKMB, TROPONINI, MYOGLOBIN in the last 168 hours.  Invalid input(s): CK ------------------------------------------------------------------------------------------------------------------    Component Value Date/Time   BNP 178.3 (H) 10/30/2019 1530    Micro Results Recent Results (from the past 240 hour(s))  Culture, blood (Routine x 2)     Status: None (Preliminary result)   Collection Time: 10/30/19 11:00 AM   Specimen: BLOOD LEFT FOREARM  Result  Value Ref Range Status   Specimen Description BLOOD LEFT FOREARM  Final   Special Requests   Final    BOTTLES DRAWN AEROBIC AND ANAEROBIC Blood Culture adequate volume   Culture   Final    NO GROWTH 3 DAYS Performed at [facility], [place] [address], [place], [place] [address]    Report Status PENDING  Incomplete  SARS Coronavirus 2 by RT PCR (hospital order, performed in [facility] hospital lab) Nasopharyngeal Nasopharyngeal Swab     Status: Abnormal   Collection Time: 10/30/19 11:33 AM   Specimen: Nasopharyngeal Swab  Result Value Ref Range Status   SARS Coronavirus 2 POSITIVE (A) NEGATIVE Final    Comment: RESULT CALLED TO, READ BACK BY AND VERIFIED WITH: Lowell Bouton RN 13:40 10/30/19 ([email or web address]) (NOTE) SARS-CoV-2 target nucleic acids are DETECTED  SARS-CoV-2 RNA is generally detectable in upper respiratory specimens  during the acute phase of infection.  Positive results are indicative  of the presence of the identified virus, but do not rule out bacterial infection or co-infection with other pathogens not detected by the test.  Clinical correlation with patient history and  other diagnostic information is necessary to determine patient infection status.  The expected result is negative.  Fact Sheet for Patients:   StrictlyIdeas.no   Fact Sheet for Healthcare Providers:   BankingDealers.co.za    This test is not yet approved or cleared by the [place] FDA and  has been authorized for detection and/or diagnosis of SARS-CoV-2 by FDA under an Emergency Use Authorization (EUA).  This EUA will remain in effect (meaning this te  st can be used) for the duration of  the COVID-19 declaration under Section 564(b)(1) of the Act, 21 U.S.C. section 360-bbb-3(b)(1), unless the authorization is terminated or revoked sooner.  Performed at [facility], [place] [address] [place], [place] [address]   Culture, Urine     Status: None  (Preliminary result)   Collection Time: 10/30/19  1:34 PM   Specimen: Urine, Random  Result Value Ref Range Status   Specimen Description URINE, RANDOM  Final   Special Requests NONE  Final   Culture   Final    CULTURE REINCUBATED FOR BETTER GROWTH Performed at [facility], [place] [address] [place], [place] [address]    Report Status PENDING  Incomplete  Culture, blood (Routine x 2)     Status: None (Preliminary result)   Collection Time: 10/30/19  3:36 PM   Specimen: BLOOD  Result Value Ref Range Status   Specimen Description BLOOD RIGHT ANTECUBITAL  Final   Special Requests   Final    BOTTLES DRAWN AEROBIC ONLY Blood Culture adequate volume   Culture   Final    NO GROWTH 3 DAYS Performed at [facility], [place] [address], [place], [place] [address]    Report Status PENDING  Incomplete  MRSA PCR Screening     Status: None   Collection Time: 11/01/19  5:48 AM   Specimen: Nasal Mucosa; Nasopharyngeal  Result Value Ref Range Status   MRSA by PCR NEGATIVE NEGATIVE Final    Comment:        The GeneXpert MRSA Assay (FDA approved for NASAL specimens only), is one component of a comprehensive MRSA colonization surveillance program. It is not intended to diagnose MRSA infection nor to guide or monitor treatment for MRSA infections. Performed at [facility], [place] [address] [place], [place] [address]     Radiology Reports CT ANGIO CHEST PE W OR WO CONTRAST  Result Date: 10/31/2019 CLINICAL DATA:  Fever and COVID-19 positivity with shortness of breath EXAM: CT ANGIOGRAPHY CHEST WITH CONTRAST TECHNIQUE: Multidetector CT imaging of the chest was performed using the standard protocol during bolus administration of intravenous contrast. Multiplanar CT image reconstructions and MIPs were obtained to evaluate the vascular anatomy. CONTRAST:  50mL OMNIPAQUE IOHEXOL 350 MG/ML SOLN COMPARISON:  10/30/2019 FINDINGS: Cardiovascular: Thoracic aorta demonstrates atherosclerotic  calcifications without aneurysmal dilatation. The degree of opacification is somewhat limited. Stable cardiomegaly is noted. Pulmonary artery demonstrates a normal branching pattern. No definitive filling defect to suggest pulmonary embolism is noted. Coronary calcifications are noted. Mediastinum/Nodes: Thoracic inlet is within normal limits. Scattered hilar and mediastinal lymph nodes are noted slightly more prominent than that seen on the prior exam particularly in the subcarinal region. These are felt to be reactive in nature. The esophagus as visualized is within normal limits. Lungs/Pleura: Emphysematous changes are noted in the lungs bilaterally. Patchy bilateral lower lobe infiltrates are noted right greater than left. The degree of aeration has improved although some worsening of the infiltrate is noted particularly in the superior aspect of the right lower lobe. Upper Abdomen: Upper abdomen demonstrates cirrhotic change of the liver. No other focal abnormality is seen. Musculoskeletal: Degenerative changes of the thoracic spine are noted. Review of the MIP images confirms the above findings. IMPRESSION: No evidence of pulmonary emboli. Bilateral lower lobe waxing and waning infiltrates worse on the right. No sizable effusions are seen. Reactive hilar and mediastinal adenopathy is noted. Changes of cirrhosis of the liver. Aortic Atherosclerosis (  ICD10-I70.0). Electronically Signed   By: Inez Catalina M.D.   On: 10/31/2019 01:33   DG Chest Port 1 View  Result Date: 10/30/2019 CLINICAL DATA:  Shortness of breath, fever EXAM: PORTABLE CHEST 1 VIEW COMPARISON:  08/12/2019 FINDINGS: Bilateral perihilar and lower lung opacities. No significant pleural effusion. No pneumothorax. Cardiomegaly. IMPRESSION: Bilateral perihilar lower lung opacities may reflect multifocal pneumonia or edema. Cardiomegaly. Electronically Signed   By: Macy Mis M.D.   On: 10/30/2019 12:10   ECHOCARDIOGRAM COMPLETE  Result  Date: 10/31/2019    ECHOCARDIOGRAM REPORT   Patient Name:   MELEAH DEMEYER Waibel Date of Exam: 10/31/2019 Medical Rec #:  [id number]       Height:       68.0 in Accession #:    [id number]      Weight:       270.0 lb Date of Birth:  07-27-1945        BSA:          2.322 m Patient Age:    59 years        BP:           97/64 mmHg Patient Gender: F               HR:           66 bpm. Exam Location:  Inpatient Procedure: 2D Echo Indications:    elevated troponin  History:        Patient has no prior history of Echocardiogram examinations.                 CHF, COPD; Risk Factors:Hypertension, Diabetes and Former                 Smoker. Covid + patient.  Sonographer:    Jannett Celestine RDCS (AE) Referring Phys: [id number] Mckinley Jewel  Sonographer Comments: Patient is morbidly obese. Image acquisition challenging due to patient body habitus. immobile patient. off axis apical windows IMPRESSIONS  1. Left ventricular ejection fraction, by estimation, is 55 to 60%. The left ventricle has normal function. The left ventricle has no regional wall motion abnormalities. There is mild left ventricular hypertrophy. Left ventricular diastolic parameters are consistent with Grade II diastolic dysfunction (pseudonormalization). There is the interventricular septum is flattened in systole and diastole, consistent with right ventricular pressure and volume overload.  2. Right ventricular systolic function is normal. The right ventricular size is mildly enlarged.  3. The mitral valve is normal in structure. Trivial mitral valve regurgitation. No evidence of mitral stenosis.  4. The aortic valve is normal in structure. Aortic valve regurgitation is not visualized. No aortic stenosis is present.  5. Aortic dilatation noted. There is mild dilatation of the ascending aorta measuring 39 mm.  6. The inferior vena cava is normal in size with greater than 50% respiratory variability, suggesting right atrial pressure of 3 mmHg. FINDINGS  Left Ventricle:  Left ventricular ejection fraction, by estimation, is 55 to 60%. The left ventricle has normal function. The left ventricle has no regional wall motion abnormalities. The left ventricular internal cavity size was normal in size. There is  mild left ventricular hypertrophy. The interventricular septum is flattened in systole and diastole, consistent with right ventricular pressure and volume overload. Left ventricular diastolic parameters are consistent with Grade II diastolic dysfunction  (pseudonormalization). Right Ventricle: The right ventricular size is mildly enlarged. No increase in right ventricular wall thickness. Right ventricular systolic function is normal. Left Atrium: Left atrial size  was normal in size. Right Atrium: Right atrial size was normal in size. Pericardium: There is no evidence of pericardial effusion. Mitral Valve: The mitral valve is normal in structure. Normal mobility of the mitral valve leaflets. Trivial mitral valve regurgitation. No evidence of mitral valve stenosis. Tricuspid Valve: The tricuspid valve is normal in structure. Tricuspid valve regurgitation is not demonstrated. No evidence of tricuspid stenosis. Aortic Valve: The aortic valve is normal in structure. Aortic valve regurgitation is not visualized. No aortic stenosis is present. Pulmonic Valve: The pulmonic valve was normal in structure. Pulmonic valve regurgitation is not visualized. No evidence of pulmonic stenosis. Aorta: Aortic dilatation noted. There is mild dilatation of the ascending aorta measuring 39 mm. Venous: The inferior vena cava is normal in size with greater than 50% respiratory variability, suggesting right atrial pressure of 3 mmHg. IAS/Shunts: No atrial level shunt detected by color flow Doppler.  LEFT VENTRICLE PLAX 2D LVIDd:         4.56 cm  Diastology LVIDs:         3.58 cm  LV e' lateral:   9.03 cm/s LV PW:         1.24 cm  LV E/e' lateral: 7.5 LV IVS:        1.23 cm  LV e' medial:    4.68 cm/s LVOT  diam:     2.35 cm  LV E/e' medial:  14.5 LV SV:         104 LV SV Index:   45 LVOT Area:     4.34 cm  RIGHT VENTRICLE RV S prime:     9.03 cm/s TAPSE (M-mode): 2.0 cm LEFT ATRIUM           Index LA diam:      3.40 cm 1.46 cm/m LA Vol (A4C): 62.9 ml 27.09 ml/m  AORTIC VALVE LVOT Vmax:   117.00 cm/s LVOT Vmean:  84.300 cm/s LVOT VTI:    0.239 m  AORTA Ao Root diam: 3.90 cm MITRAL VALVE MV Area (PHT): 3.65 cm    SHUNTS MV Decel Time: 208 msec    Systemic VTI:  0.24 m MV E velocity: 67.70 cm/s  Systemic Diam: 2.35 cm MV A velocity: 50.10 cm/s MV E/A ratio:  1.35 Candee Furbish MD Electronically signed by Candee Furbish MD Signature Date/Time: 10/31/2019/1:53:07 PM    Final    VAS [place] LOWER EXTREMITY VENOUS (DVT)  Result Date: 10/31/2019  Lower Venous DVTStudy Indications: Swelling, and Edema.  Limitations: Poor ultrasound/tissue interface and body habitus. Comparison Study: no prior Performing Technologist: Abram Sander RVS  Examination Guidelines: A complete evaluation includes B-mode imaging, spectral Doppler, color Doppler, and power Doppler as needed of all accessible portions of each vessel. Bilateral testing is considered an integral part of a complete examination. Limited examinations for reoccurring indications may be performed as noted. The reflux portion of the exam is performed with the patient in reverse Trendelenburg.  +---------+---------------+---------+-----------+----------+--------------+ RIGHT    CompressibilityPhasicitySpontaneityPropertiesThrombus Aging +---------+---------------+---------+-----------+----------+--------------+ CFV      Full           Yes      Yes                                 +---------+---------------+---------+-----------+----------+--------------+ SFJ      Full                                                        +---------+---------------+---------+-----------+----------+--------------+  FV Prox  Full                                                         +---------+---------------+---------+-----------+----------+--------------+ FV Mid   Full                                                        +---------+---------------+---------+-----------+----------+--------------+ FV Distal                                             Not visualized +---------+---------------+---------+-----------+----------+--------------+ PFV      Full                                                        +---------+---------------+---------+-----------+----------+--------------+ POP      Full           Yes      Yes                                 +---------+---------------+---------+-----------+----------+--------------+ PTV                                                   Not visualized +---------+---------------+---------+-----------+----------+--------------+ PERO                                                  Not visualized +---------+---------------+---------+-----------+----------+--------------+   +---------+---------------+---------+-----------+----------+--------------+ LEFT     CompressibilityPhasicitySpontaneityPropertiesThrombus Aging +---------+---------------+---------+-----------+----------+--------------+ CFV      Full           Yes      Yes                                 +---------+---------------+---------+-----------+----------+--------------+ SFJ      Full                                                        +---------+---------------+---------+-----------+----------+--------------+ FV Prox  Full                                                        +---------+---------------+---------+-----------+----------+--------------+ FV Mid  Not visualized +---------+---------------+---------+-----------+----------+--------------+ FV Distal                                             Not visualized  +---------+---------------+---------+-----------+----------+--------------+ PFV      Full                                                        +---------+---------------+---------+-----------+----------+--------------+ POP      Full           Yes      Yes                                 +---------+---------------+---------+-----------+----------+--------------+ PTV                                                   Not visualized +---------+---------------+---------+-----------+----------+--------------+ PERO                                                  Not visualized +---------+---------------+---------+-----------+----------+--------------+     Summary: BILATERAL: -No evidence of popliteal cyst, bilaterally. RIGHT: - There is no evidence of deep vein thrombosis in the lower extremity. However, portions of this examination were limited- see technologist comments above.  LEFT: - There is no evidence of deep vein thrombosis in the lower extremity. However, portions of this examination were limited- see technologist comments above.  *See table(s) above for measurements and observations. Electronically signed by Deitra Mayo MD on 10/31/2019 at 2:50:09 PM.    Final

## 2019-11-02 NOTE — Progress Notes (Signed)
Bipap last used on 7/26, CPAP not being used at this time; COVID patient.

## 2019-11-02 NOTE — Progress Notes (Signed)
Physical Therapy Treatment Patient Details Name: Cassandra Barnes MRN: [id number] DOB: [birth year] Today's Date: 11/02/2019    History of Present Illness  Cassandra Barnes is a 74 y.o. female with medical history significant for chronic hypoxemic respiratory failure secondary to underlying COPD-on 2 L of oxygen via nasal cannula, hypertension, diabetes, CHF, morbid obesity, gout, GERD, chronic pressure ulcers on heel, bedridden, Covid pneumonia presents to emergency department with worsening shortness of breath since 1 week and confusion started yesterday and her symptoms got worse this morning therefore family called EMS and brought patient to the emergency department for further evaluation and management.    PT Comments    Pt finding it more difficult to roll and move about in the bed than PTA.  She is now open to the idea of SNF for rehab.  Pt was able to participate in LE exercises, bed mobility and did desaturate with those activities to 85% on 2 L O2 [place] (she was on 2 L at home).  She rebounded in <2 mins back into the low 90s.  PT will continue to follow acutely for safe mobility progression.  If she is agreeable , plan to lift OOB to chair next session.  Will need geomat cushion for the chair.    Follow Up Recommendations  SNF     Equipment Recommendations  None recommended by PT    Recommendations for Other Services   NA     Precautions / Restrictions Precautions Precautions: Fall Precaution Comments: Weak, pressure injuries bil heels.  Required Braces or Orthoses: Other Brace Other Brace: asked Dr. Sloan Leiter for prevalons for heel protection    Mobility  Bed Mobility Overal bed mobility: Needs Assistance Bed Mobility: Rolling;Supine to Sit Rolling: Mod assist   Supine to sit: Mod assist;HOB elevated     General bed mobility comments: Mod assist to roll left mostly to manage legs and hips pt able to manage uper extremities and trunk with her hands, harder to roll to the right  due to L hand is more arthritic, pt able to pull to long sitting with HOB elevated for positioning of pillow to eat in more upright position.   Transfers                 General transfer comment: Per pt report she is lifted via hoyer OOB to [facility] for MD appointments.       Balance Overall balance assessment: Needs assistance Sitting-balance support: Single extremity supported Sitting balance-Leahy Scale: Poor Sitting balance - Comments: mod assist at trunk using bed rail for long sitting.                                    Cognition Arousal/Alertness: Awake/alert Behavior During Therapy: WFL for tasks assessed/performed Overall Cognitive Status: Within Functional Limits for tasks assessed                                        Exercises General Exercises - Lower Extremity Ankle Circles/Pumps: AROM;Both;5 reps Heel Slides: AROM;Both;5 reps Straight Leg Raises: AROM;Both;5 reps    General Comments General comments (skin integrity, edema, etc.): Pt desaturated to 85% on 2 L O2 [place] during bed mobility and exercises. Came back to 90s with 1-2 mins of rest.       Pertinent Vitals/Pain Pain Assessment: Faces Faces  Pain Scale: Hurts little more Pain Location: feet/heels, legs and general Pain Descriptors / Indicators: Discomfort;Grimacing;Guarding Pain Intervention(s): Limited activity within patient's tolerance;Monitored during session;Repositioned;Other (comment) (floated heels and placed in R rotation to get off of bottom.)           PT Goals (current goals can now be found in the care plan section) Acute Rehab PT Goals Patient Stated Goal: get stronger at rehab Progress towards PT goals: Progressing toward goals    Frequency    Min 2X/week      PT Plan Discharge plan needs to be updated       AM-PAC PT "6 Clicks" Mobility   Outcome Measure  Help needed turning from your back to your side while in a flat bed without using  bedrails?: A Lot Help needed moving from lying on your back to sitting on the side of a flat bed without using bedrails?: A Lot Help needed moving to and from a bed to a chair (including a wheelchair)?: Total Help needed standing up from a chair using your arms (e.g., wheelchair or bedside chair)?: Total Help needed to walk in hospital room?: Total Help needed climbing 3-5 steps with a railing? : Total 6 Click Score: 8    End of Session Equipment Utilized During Treatment: Oxygen Activity Tolerance: Patient limited by pain;Patient limited by fatigue Patient left: in bed;with call bell/phone within reach   PT Visit Diagnosis: Muscle weakness (generalized) (M62.81);Difficulty in walking, not elsewhere classified (R26.2);Pain Pain - Right/Left:  (bil) Pain - part of body: Leg (bil legs)     Time: 1238-1300 PT Time Calculation (min) (ACUTE ONLY): 22 min  Charges:  $Therapeutic Activity: 8-22 mins                    Verdene Lennert, PT, DPT  Acute Rehabilitation [phone] pager [phone]) [phone] office

## 2019-11-03 LAB — CBC WITH DIFFERENTIAL/PLATELET
Abs Immature Granulocytes: 0.11 10*3/uL — ABNORMAL HIGH (ref 0.00–0.07)
Basophils Absolute: 0 10*3/uL (ref 0.0–0.1)
Basophils Relative: 0 %
Eosinophils Absolute: 0 10*3/uL (ref 0.0–0.5)
Eosinophils Relative: 0 %
HCT: 28.1 % — ABNORMAL LOW (ref 36.0–46.0)
Hemoglobin: 7.7 g/dL — ABNORMAL LOW (ref 12.0–15.0)
Immature Granulocytes: 2 %
Lymphocytes Relative: 25 %
Lymphs Abs: 1.2 10*3/uL (ref 0.7–4.0)
MCH: 27.1 pg (ref 26.0–34.0)
MCHC: 27.4 g/dL — ABNORMAL LOW (ref 30.0–36.0)
MCV: 98.9 fL (ref 80.0–100.0)
Monocytes Absolute: 0.4 10*3/uL (ref 0.1–1.0)
Monocytes Relative: 7 %
Neutro Abs: 3.2 10*3/uL (ref 1.7–7.7)
Neutrophils Relative %: 66 %
Platelets: 179 10*3/uL (ref 150–400)
RBC: 2.84 MIL/uL — ABNORMAL LOW (ref 3.87–5.11)
RDW: 14.5 % (ref 11.5–15.5)
WBC: 4.9 10*3/uL (ref 4.0–10.5)
nRBC: 0 % (ref 0.0–0.2)

## 2019-11-03 LAB — GLUCOSE, CAPILLARY
Glucose-Capillary: 176 mg/dL — ABNORMAL HIGH (ref 70–99)
Glucose-Capillary: 244 mg/dL — ABNORMAL HIGH (ref 70–99)
Glucose-Capillary: 257 mg/dL — ABNORMAL HIGH (ref 70–99)
Glucose-Capillary: 384 mg/dL — ABNORMAL HIGH (ref 70–99)

## 2019-11-03 LAB — C-REACTIVE PROTEIN: CRP: 1.2 mg/dL — ABNORMAL HIGH (ref <1.0)

## 2019-11-03 LAB — COMPREHENSIVE METABOLIC PANEL
ALT: 12 U/L (ref 0–44)
AST: 16 U/L (ref 15–41)
Albumin: 2.8 g/dL — ABNORMAL LOW (ref 3.5–5.0)
Alkaline Phosphatase: 51 U/L (ref 38–126)
Anion gap: 11 (ref 5–15)
BUN: 34 mg/dL — ABNORMAL HIGH (ref 8–23)
CO2: 30 mmol/L (ref 22–32)
Calcium: 8.7 mg/dL — ABNORMAL LOW (ref 8.9–10.3)
Chloride: 98 mmol/L (ref 98–111)
Creatinine, Ser: 0.78 mg/dL (ref 0.44–1.00)
GFR calc Af Amer: >60 mL/min (ref >60)
GFR calc non Af Amer: >60 mL/min (ref >60)
Glucose, Bld: 204 mg/dL — ABNORMAL HIGH (ref 70–99)
Potassium: 4.5 mmol/L (ref 3.5–5.1)
Sodium: 139 mmol/L (ref 135–145)
Total Bilirubin: 0.3 mg/dL (ref 0.3–1.2)
Total Protein: 6 g/dL — ABNORMAL LOW (ref 6.5–8.1)

## 2019-11-03 LAB — D-DIMER, QUANTITATIVE: D-Dimer, Quant: 2.11 ug/mL-FEU — ABNORMAL HIGH (ref 0.00–0.50)

## 2019-11-03 LAB — URINE CULTURE: Culture: >=100000 — AB

## 2019-11-03 LAB — MAGNESIUM: Magnesium: 2.3 mg/dL (ref 1.7–2.4)

## 2019-11-03 LAB — FERRITIN: Ferritin: 28 ng/mL (ref 11–307)

## 2019-11-03 MED ORDER — MIDODRINE HCL 5 MG PO TABS
5.0000 mg | ORAL_TABLET | Freq: Two times a day (BID) | ORAL | Status: DC
  Administered 2019-11-03 – 2019-11-07 (×9): 5 mg via ORAL
  Filled 2019-11-03 (×8): qty 1

## 2019-11-03 MED ORDER — METHYLPREDNISOLONE SODIUM SUCC 40 MG IJ SOLR
20.0000 mg | Freq: Two times a day (BID) | INTRAMUSCULAR | Status: DC
  Administered 2019-11-03 – 2019-11-04 (×2): 20 mg via INTRAVENOUS
  Filled 2019-11-03 (×2): qty 1

## 2019-11-03 MED ORDER — POLYVINYL ALCOHOL 1.4 % OP SOLN
1.0000 [drp] | OPHTHALMIC | Status: DC | PRN
  Administered 2019-11-03: 1 [drp] via OPHTHALMIC
  Filled 2019-11-03: qty 15

## 2019-11-03 NOTE — Progress Notes (Signed)
PROGRESS NOTE                                                                                                                                                                                                             Patient Demographics:    Cassandra Barnes, is a 74 y.o. female, DOB - Sep 18, 1945, ZOX:[id number]  Outpatient Primary MD for the patient is Cher Nakai, MD   Admit date - 10/30/2019   LOS - 4  Chief Complaint  Patient presents with  . Shortness of Breath  . Fever       Brief Narrative: Patient is a 74 y.o. female with PMHx of COVID-19 infection sometime in May 2021-( hospitalized at [facility]), COPD on 2 L of oxygen at home, chronic diastolic heart failure, HTN, DM-2, bedridden with chronic heel ulcers-presented with shortness of breath, fever along with confusion-found to have hypercarbia, possible COVID-19 pneumonitis-and subsequently admitted to the hospitalist service.  See below for further details.  Significant Events: 7/26>> admit to [facility] for confusion/hypercarbia/COVID-19 pneumonia 7/26>> transfer to [facility]M-as patient developed lethargy-ABG with hypercarbia-briefly required BiPAP. 7/27>> liberated off BiPAP-awake/alert -transfer to 5W  Significant studies: 7/27>> echo: EF 81-19%, grade 2 diastolic dysfunction.  Interventricular septum flattened-consistent with volume overload. 7/27>> CTA chest: No PE, bilateral lower lobe infiltrates worse on the right, changes of cirrhosis of the liver 7/27>> lower extremity Doppler: No DVT  COVID-19 medications: Steroids: 7/26>> Remdesivir: 7/27  Antibiotics: Rocephin: 7/26>> Zithromax: 7/26>>7/29  Microbiology data: 7/26: Blood culture>> negative so far  Procedures: None  Consults: None  DVT prophylaxis: enoxaparin (LOVENOX) injection 40 mg Start: 11/01/19 1000 SCDs Start: 10/30/19 1429    Subjective:   Lying comfortably in bed-no major issues  overnight.   Assessment  & Plan :   Sepsis secondary to pneumonia: Sepsis physiology has significantly improved-suspect pneumonia mostly from bacterial pneumonia-not sure if patient has COVID-19 pneumonia at this point-she has a history of having COVID-19 pneumonia in May 2021 requiring hospitalization in [facility] in [employer].  Continue Rocephin, Remdesivir-and tapering steroids.   Fever: afebrile O2 requirements:  SpO2: 95 % O2 Flow Rate (L/min): 2 L/min FiO2 (%): 40 %   COVID-19 Labs: Recent Labs    11/01/19 0424 11/02/19 0402 11/03/19 0559  DDIMER 3.26* 2.42* 2.11*  FERRITIN 36 30 28  CRP 7.2* 2.8* 1.2*  Component Value Date/Time   BNP 178.3 (H) 10/30/2019 1530    Recent Labs  Lab 10/30/19 1530  PROCALCITON 0.49    Lab Results  Component Value Date   SARSCOV2NAA POSITIVE (A) 10/30/2019     Prone/Incentive Spirometry:  incentive spirometry use 3-4/hour.  Acute metabolic encephalopathy: Likely secondary to hypercarbia-resolved-she is completely awake and alert-she briefly required BiPAP-however no BiPAP requirement over the past 2-3 days  Acute on chronic hypoxic/hypercarbic respiratory failure: Suspect multifactorial from pneumonia and possible decompensated diastolic heart failure.  Continue IV Lasix and IV antimicrobial therapy.  Acute on chronic diastolic heart failure: Improving-has severe hypoalbuminemia which is contributing to edema as well-has CT findings of possible cirrhosis-continue IV Lasix-keep in negative balance.     COPD with chronic hypoxic/hypercarbic respiratory failure: Does not appear to be in exacerbation-no wheezing-but remains at risk for hypercarbia--continue bronchodilators-avoid/minimize narcotics/benzos as much as possible.  AKI: Appears to be mild-likely hemodynamically mediated-improved with supportive care-remains volume overloaded-continue with diuretics cautiously.  Anemia: Normocytic anemia-likely secondary to  acute illness-appears to have some amount of chronic anemia as well-no indication of blood loss.  No indication to transfuse-follow CBC periodically  Hypotension: BP much stable-change midodrine to twice daily dosing-suspect we could discontinue the next day or so. Hypotension probably related to sepsis physiology-no indication of volume loss or cardiogenic shock.  Hyperkalemia: Resolved with Lokelma and Lasix.  HTN: Hold all antihypertensives due to hypotension-patient on midodrine.  DM-2: CBGs stable-continue Lantus 15 units twice daily, tenets of NovoLog with meals and SSI-suspected steroids get tapered down-we will need further adjustment of insulin regimen.   Recent Labs    11/02/19 2118 11/03/19 0741 11/03/19 1138  GLUCAP 216* 176* 244*   CT chest evidence of evidence of cirrhosis: Stable for outpatient follow-up-we will be on diuretics in any event.  Peripheral neuropathy: Continue Neurontin  Bilateral lower extremity heel ulcers: Chronic issue-appreciate wound care input.  Bedbound with significant debility/deconditioning at baseline for more than 1 year  Palliative care discussion: Bedbound for more than a year-COPD with chronic hypoxemia/hypercarbia on 2 L of oxygen at home-with encephalopathy and worsening respiratory failure from pneumonia/CHF.  Discussed with patient at length regarding CODE STATUS-after discussion with family-she remains a full code.  Overall long-term prognosis is poor given extensive medical comorbidities and bedbound status.  Morbid Obesity: Estimated body mass index is 42.04 kg/m as calculated from the following:   Height as of this encounter: 5\' 8"  (1.727 m).   Weight as of this encounter: 125.4 kg.   RN pressure injury documentation: Pressure Injury 10/30/19 Heel Right;Left Unstageable - Full thickness tissue loss in which the base of the injury is covered by slough (yellow, tan, gray, green or brown) and/or eschar (tan, brown or black) in the  wound bed. (Active)  10/30/19   Location: Heel  Location Orientation: Right;Left  Staging: Unstageable - Full thickness tissue loss in which the base of the injury is covered by slough (yellow, tan, gray, green or brown) and/or eschar (tan, brown or black) in the wound bed.  Wound Description (Comments):   Present on Admission: Yes     Pressure Injury 11/02/19 Heel Left Unstageable - Full thickness tissue loss in which the base of the injury is covered by slough (yellow, tan, gray, green or brown) and/or eschar (tan, brown or black) in the wound bed. (Active)  11/02/19 1606  Location: Heel  Location Orientation: Left  Staging: Unstageable - Full thickness tissue loss in which the base of the injury is  covered by slough (yellow, tan, gray, green or brown) and/or eschar (tan, brown or black) in the wound bed.  Wound Description (Comments):   Present on Admission: Yes    GI prophylaxis: PPI  ABG:    Component Value Date/Time   PHART 7.320 (L) 10/31/2019 0207   PCO2ART 74.9 (HH) 10/31/2019 0207   PO2ART 96 10/31/2019 0207   HCO3 38.7 (H) 10/31/2019 0207   TCO2 41 (H) 10/31/2019 0207   O2SAT 96.0 10/31/2019 0207    Vent Settings:     Condition - Guarded  Family Communication  :  Daughter updated over the phone on 7/29  Code Status :  Full Code  Diet :  Diet Order            Diet Carb Modified Fluid consistency: Thin; Room service appropriate? Yes with Assist; Fluid restriction: 1500 mL Fluid  Diet effective now                  Disposition Plan  :  Status is: Inpatient  Remains inpatient appropriate because:Inpatient level of care appropriate due to severity of illness   Dispo: The patient is from: Home              Anticipated d/c is to: Home              Anticipated d/c date is: > 3 days              Patient currently is not medically stable to d/c.   Barriers to discharge: Hypoxia requiring O2 supplementation/complete 5 days of IV  Remdesivir  Antimicorbials  :    Anti-infectives (From admission, onward)   Start     Dose/Rate Route Frequency Ordered Stop   11/01/19 1000  remdesivir 100 mg in sodium chloride 0.9 % 100 mL IVPB     Discontinue     100 mg 200 mL/hr over 30 Minutes Intravenous Daily 10/31/19 0749 11/05/19 0959   10/31/19 1400  vancomycin (VANCOREADY) IVPB 1500 mg/300 mL  Status:  Discontinued        1,500 mg 150 mL/hr over 120 Minutes Intravenous Every 24 hours 10/30/19 1311 10/30/19 1457   10/31/19 0830  remdesivir 200 mg in sodium chloride 0.9% 250 mL IVPB        200 mg 580 mL/hr over 30 Minutes Intravenous Once 10/31/19 0749 10/31/19 1730   10/30/19 1730  piperacillin-tazobactam (ZOSYN) IVPB 3.375 g  Status:  Discontinued        3.375 g 12.5 mL/hr over 240 Minutes Intravenous Every 8 hours 10/30/19 1311 10/30/19 1457   10/30/19 1600  cefTRIAXone (ROCEPHIN) 1 g in sodium chloride 0.9 % 100 mL IVPB     Discontinue     1 g 200 mL/hr over 30 Minutes Intravenous Every 24 hours 10/30/19 1432 11/04/19 1559   10/30/19 1530  azithromycin (ZITHROMAX) 500 mg in sodium chloride 0.9 % 250 mL IVPB        500 mg 250 mL/hr over 60 Minutes Intravenous Every 24 hours 10/30/19 1432 11/02/19 1143   10/30/19 1200  vancomycin (VANCOCIN) 2,500 mg in sodium chloride 0.9 % 500 mL IVPB        2,500 mg 250 mL/hr over 120 Minutes Intravenous  Once 10/30/19 1148 10/30/19 1648   10/30/19 1130  piperacillin-tazobactam (ZOSYN) IVPB 3.375 g        3.375 g 100 mL/hr over 30 Minutes Intravenous  Once 10/30/19 1116 10/30/19 1516      Inpatient Medications  Scheduled Meds: . albuterol  2 puff Inhalation Q6H  . vitamin C  500 mg Oral Daily  . aspirin EC  81 mg Oral Daily  . Chlorhexidine Gluconate Cloth  6 each Topical Daily  . colchicine  0.6 mg Oral Daily  . collagenase   Topical Daily  . enoxaparin (LOVENOX) injection  40 mg Subcutaneous Q24H  . ferrous sulfate  325 mg Oral Q breakfast  . furosemide  40 mg Intravenous  Daily  . gabapentin  600 mg Oral BID  . insulin aspart  0-20 Units Subcutaneous TID WC  . insulin aspart  0-5 Units Subcutaneous QHS  . insulin aspart  10 Units Subcutaneous TID WC  . insulin glargine  50 Units Subcutaneous BID  . mouth rinse  15 mL Mouth Rinse BID  . methylPREDNISolone (SOLU-MEDROL) injection  30 mg Intravenous Q12H  . midodrine  5 mg Oral BID WC  . pantoprazole  40 mg Oral BID  . umeclidinium bromide  1 puff Inhalation Daily  . zinc sulfate  220 mg Oral Daily   Continuous Infusions: . cefTRIAXone (ROCEPHIN)  IV 1 g (11/02/19 1520)  . remdesivir 100 mg in NS 100 mL 100 mg (11/03/19 0835)   PRN Meds:.acetaminophen, albuterol, guaiFENesin-dextromethorphan, ipratropium-albuterol, ondansetron **OR** ondansetron (ZOFRAN) IV, polyvinyl alcohol   Time Spent in minutes  25    See all Orders from today for further details   Oren Binet M.D on 11/03/2019 at 2:01 PM  To page go to [email or web address] - use universal password  [facility] -  Office  [phone]    Objective:   Vitals:   11/03/19 0436 11/03/19 0512 11/03/19 0800 11/03/19 1211  BP:  (!) 136/42  128/70  Pulse: 52 53  56  Resp: 14 16  17   Temp:  98.2 F (36.8 C) 97.8 F (36.6 C) 98.7 F (37.1 C)  TempSrc:  Oral Axillary Axillary  SpO2: 96% 94%  95%  Weight: (!) 125.4 kg     Height:        Wt Readings from Last 3 Encounters:  11/03/19 (!) 125.4 kg  02/10/14 122.5 kg     Intake/Output Summary (Last 24 hours) at 11/03/2019 1401 Last data filed at 11/03/2019 1100 Gross per 24 hour  Intake 700 ml  Output 2950 ml  Net -2250 ml     Physical Exam Gen Exam:Alert awake-not in any distress HEENT:atraumatic, normocephalic Chest: B/L clear to auscultation anteriorly CVS:S1S2 regular Abdomen:soft non tender, non distended Extremities:+ edema Neurology: Non focal Skin: no rash   Data Review:    CBC Recent Labs  Lab 10/30/19 1100 10/30/19 1100 10/30/19 1530 10/30/19 1530  10/31/19 0207 10/31/19 0343 11/01/19 0424 11/02/19 0402 11/03/19 0559  WBC 14.2*   < > 13.4*  --   --  10.7* 8.8 6.9 4.9  HGB 8.1*   < > 8.0*   < > 9.2* 9.2* 8.2* 7.9* 7.7*  HCT 29.4*   < > 29.2*   < > 27.0* 33.0* 28.7* 28.5* 28.1*  PLT 182   < > 177  --   --  172 185 201 179  MCV 98.3   < > 100.0  --   --  100.0 96.6 97.6 98.9  MCH 27.1   < > 27.4  --   --  27.9 27.6 27.1 27.1  MCHC 27.6*   < > 27.4*  --   --  27.9* 28.6* 27.7* 27.4*  RDW 14.7   < > 14.7  --   --  14.7 14.6 14.7 14.5  LYMPHSABS 1.3  --   --   --   --  1.4 1.4 1.4 1.2  MONOABS 0.8  --   --   --   --  0.2 0.3 0.3 0.4  EOSABS 0.0  --   --   --   --  0.0 0.0 0.0 0.0  BASOSABS 0.0  --   --   --   --  0.0 0.0 0.0 0.0   < > = values in this interval not displayed.    Chemistries  Recent Labs  Lab 10/30/19 1100 10/30/19 1100 10/30/19 1530 10/31/19 0207 10/31/19 0343 11/01/19 0424 11/02/19 0402 11/03/19 0559  NA 136   < >  --  134* 137 135 136 139  K 4.6   < >  --  5.4* 5.8* 5.1 4.8 4.5  CL 92*  --   --   --  97* 93* 94* 98  CO2 37*  --   --   --  27 33* 33* 30  GLUCOSE 109*  --   --   --  266* 195* 208* 204*  BUN 16  --   --   --  26* 34* 37* 34*  CREATININE 1.23*   < > 1.42*  --  1.27* 0.83 0.92 0.78  CALCIUM 9.2  --   --   --  8.7* 8.5* 8.6* 8.7*  MG  --   --   --   --  1.9 2.1 2.4 2.3  AST 26  --   --   --  27 19 15 16   ALT 17  --   --   --  17 15 13 12   ALKPHOS 64  --   --   --  61 56 49 51  BILITOT 0.5  --   --   --  0.5 0.3 0.3 0.3   < > = values in this interval not displayed.   ------------------------------------------------------------------------------------------------------------------ No results for input(s): CHOL, HDL, LDLCALC, TRIG, CHOLHDL, LDLDIRECT in the last 72 hours.  Lab Results  Component Value Date   HGBA1C 6.4 (H) 11/01/2019   ------------------------------------------------------------------------------------------------------------------ No results for input(s): TSH,  T4TOTAL, T3FREE, THYROIDAB in the last 72 hours.  Invalid input(s): FREET3 ------------------------------------------------------------------------------------------------------------------ Recent Labs    11/02/19 0402 11/03/19 0559  FERRITIN 30 28    Coagulation profile Recent Labs  Lab 10/30/19 1100  INR 1.1    Recent Labs    11/02/19 0402 11/03/19 0559  DDIMER 2.42* 2.11*    Cardiac Enzymes No results for input(s): CKMB, TROPONINI, MYOGLOBIN in the last 168 hours.  Invalid input(s): CK ------------------------------------------------------------------------------------------------------------------    Component Value Date/Time   BNP 178.3 (H) 10/30/2019 1530    Micro Results Recent Results (from the past 240 hour(s))  Culture, blood (Routine x 2)     Status: None (Preliminary result)   Collection Time: 10/30/19 11:00 AM   Specimen: BLOOD LEFT FOREARM  Result Value Ref Range Status   Specimen Description BLOOD LEFT FOREARM  Final   Special Requests   Final    BOTTLES DRAWN AEROBIC AND ANAEROBIC Blood Culture adequate volume   Culture   Final    NO GROWTH 3 DAYS Performed at [facility], [place] [address], [place], [place] [address]    Report Status PENDING  Incomplete  SARS Coronavirus 2 by RT PCR (hospital order, performed in [facility] hospital lab) Nasopharyngeal Nasopharyngeal Swab     Status: Abnormal   Collection Time:  10/30/19 11:33 AM   Specimen: Nasopharyngeal Swab  Result Value Ref Range Status   SARS Coronavirus 2 POSITIVE (A) NEGATIVE Final    Comment: RESULT CALLED TO, READ BACK BY AND VERIFIED WITH: Lowell Bouton RN 13:40 10/30/19 ([email or web address]) (NOTE) SARS-CoV-2 target nucleic acids are DETECTED  SARS-CoV-2 RNA is generally detectable in upper respiratory specimens  during the acute phase of infection.  Positive results are indicative  of the presence of the identified virus, but do not rule out bacterial infection or co-infection with other  pathogens not detected by the test.  Clinical correlation with patient history and  other diagnostic information is necessary to determine patient infection status.  The expected result is negative.  Fact Sheet for Patients:   StrictlyIdeas.no   Fact Sheet for Healthcare Providers:   BankingDealers.co.za    This test is not yet approved or cleared by the [place] FDA and  has been authorized for detection and/or diagnosis of SARS-CoV-2 by FDA under an Emergency Use Authorization (EUA).  This EUA will remain in effect (meaning this te st can be used) for the duration of  the COVID-19 declaration under Section 564(b)(1) of the Act, 21 U.S.C. section 360-bbb-3(b)(1), unless the authorization is terminated or revoked sooner.  Performed at [facility], [place] [address], [place], [place] [address]   Culture, Urine     Status: Abnormal   Collection Time: 10/30/19  1:34 PM   Specimen: Urine, Random  Result Value Ref Range Status   Specimen Description URINE, RANDOM  Final   Special Requests   Final    NONE Performed at [facility], [place] [address], [place], [place] [address]    Culture >=100,000 COLONIES/mL ENTEROCOCCUS FAECIUM (A)  Final   Report Status 11/03/2019 FINAL  Final   Organism ID, Bacteria ENTEROCOCCUS FAECIUM (A)  Final      Susceptibility   Enterococcus faecium - MIC*    AMPICILLIN >=32 RESISTANT Resistant     NITROFURANTOIN 64 INTERMEDIATE Intermediate     VANCOMYCIN <=0.5 SENSITIVE Sensitive     * >=100,000 COLONIES/mL ENTEROCOCCUS FAECIUM  Culture, blood (Routine x 2)     Status: None (Preliminary result)   Collection Time: 10/30/19  3:36 PM   Specimen: BLOOD  Result Value Ref Range Status   Specimen Description BLOOD RIGHT ANTECUBITAL  Final   Special Requests   Final    BOTTLES DRAWN AEROBIC ONLY Blood Culture adequate volume   Culture   Final    NO GROWTH 3 DAYS Performed at [place], [place] [address] [place], [place] [address]    Report Status PENDING  Incomplete  MRSA PCR Screening     Status: None   Collection Time: 11/01/19  5:48 AM   Specimen: Nasal Mucosa; Nasopharyngeal  Result Value Ref Range Status   MRSA by PCR NEGATIVE NEGATIVE Final    Comment:        The GeneXpert MRSA Assay (FDA approved for NASAL specimens only), is one component of a comprehensive MRSA colonization surveillance program. It is not intended to diagnose MRSA infection nor to guide or monitor treatment for MRSA infections. Performed at [facility], [place] [address], [place],  [address]     Radiology Reports CT ANGIO CHEST PE W OR WO CONTRAST  Result Date: 10/31/2019 CLINICAL DATA:  Fever and COVID-19 positivity with shortness of breath EXAM: CT ANGIOGRAPHY CHEST WITH CONTRAST TECHNIQUE: Multidetector CT imaging of the chest was performed using  the standard protocol during bolus administration of intravenous contrast. Multiplanar CT image reconstructions and MIPs were obtained to evaluate the vascular anatomy. CONTRAST:  39mL OMNIPAQUE IOHEXOL 350 MG/ML SOLN COMPARISON:  10/30/2019 FINDINGS: Cardiovascular: Thoracic aorta demonstrates atherosclerotic calcifications without aneurysmal dilatation. The degree of opacification is somewhat limited. Stable cardiomegaly is noted. Pulmonary artery demonstrates a normal branching pattern. No definitive filling defect to suggest pulmonary embolism is noted. Coronary calcifications are noted. Mediastinum/Nodes: Thoracic inlet is within normal limits. Scattered hilar and mediastinal lymph nodes are noted slightly more prominent than that seen on the prior exam particularly in the subcarinal region. These are felt to be reactive in nature. The esophagus as visualized is within normal limits. Lungs/Pleura: Emphysematous changes are noted in the lungs bilaterally. Patchy bilateral lower lobe infiltrates are noted right greater than left. The degree  of aeration has improved although some worsening of the infiltrate is noted particularly in the superior aspect of the right lower lobe. Upper Abdomen: Upper abdomen demonstrates cirrhotic change of the liver. No other focal abnormality is seen. Musculoskeletal: Degenerative changes of the thoracic spine are noted. Review of the MIP images confirms the above findings. IMPRESSION: No evidence of pulmonary emboli. Bilateral lower lobe waxing and waning infiltrates worse on the right. No sizable effusions are seen. Reactive hilar and mediastinal adenopathy is noted. Changes of cirrhosis of the liver. Aortic Atherosclerosis (ICD10-I70.0). Electronically Signed   By: Inez Catalina M.D.   On: 10/31/2019 01:33   DG Chest Port 1 View  Result Date: 10/30/2019 CLINICAL DATA:  Shortness of breath, fever EXAM: PORTABLE CHEST 1 VIEW COMPARISON:  08/12/2019 FINDINGS: Bilateral perihilar and lower lung opacities. No significant pleural effusion. No pneumothorax. Cardiomegaly. IMPRESSION: Bilateral perihilar lower lung opacities may reflect multifocal pneumonia or edema. Cardiomegaly. Electronically Signed   By: Macy Mis M.D.   On: 10/30/2019 12:10   ECHOCARDIOGRAM COMPLETE  Result Date: 10/31/2019    ECHOCARDIOGRAM REPORT   Patient Name:   Cassandra Barnes Date of Exam: 10/31/2019 Medical Rec #:  [id number]       Height:       68.0 in Accession #:    [id number]      Weight:       270.0 lb Date of Birth:  05-17-1945        BSA:          2.322 m Patient Age:    49 years        BP:           97/64 mmHg Patient Gender: F               HR:           66 bpm. Exam Location:  Inpatient Procedure: 2D Echo Indications:    elevated troponin  History:        Patient has no prior history of Echocardiogram examinations.                 CHF, COPD; Risk Factors:Hypertension, Diabetes and Former                 Smoker. Covid + patient.  Sonographer:    Jannett Celestine RDCS (AE) Referring Phys: [id number] Mckinley Jewel  Sonographer Comments:  Patient is morbidly obese. Image acquisition challenging due to patient body habitus. immobile patient. off axis apical windows IMPRESSIONS  1. Left ventricular ejection fraction, by estimation, is 55 to 60%. The left ventricle has normal function. The left ventricle has no  regional wall motion abnormalities. There is mild left ventricular hypertrophy. Left ventricular diastolic parameters are consistent with Grade II diastolic dysfunction (pseudonormalization). There is the interventricular septum is flattened in systole and diastole, consistent with right ventricular pressure and volume overload.  2. Right ventricular systolic function is normal. The right ventricular size is mildly enlarged.  3. The mitral valve is normal in structure. Trivial mitral valve regurgitation. No evidence of mitral stenosis.  4. The aortic valve is normal in structure. Aortic valve regurgitation is not visualized. No aortic stenosis is present.  5. Aortic dilatation noted. There is mild dilatation of the ascending aorta measuring 39 mm.  6. The inferior vena cava is normal in size with greater than 50% respiratory variability, suggesting right atrial pressure of 3 mmHg. FINDINGS  Left Ventricle: Left ventricular ejection fraction, by estimation, is 55 to 60%. The left ventricle has normal function. The left ventricle has no regional wall motion abnormalities. The left ventricular internal cavity size was normal in size. There is  mild left ventricular hypertrophy. The interventricular septum is flattened in systole and diastole, consistent with right ventricular pressure and volume overload. Left ventricular diastolic parameters are consistent with Grade II diastolic dysfunction  (pseudonormalization). Right Ventricle: The right ventricular size is mildly enlarged. No increase in right ventricular wall thickness. Right ventricular systolic function is normal. Left Atrium: Left atrial size was normal in size. Right Atrium: Right atrial  size was normal in size. Pericardium: There is no evidence of pericardial effusion. Mitral Valve: The mitral valve is normal in structure. Normal mobility of the mitral valve leaflets. Trivial mitral valve regurgitation. No evidence of mitral valve stenosis. Tricuspid Valve: The tricuspid valve is normal in structure. Tricuspid valve regurgitation is not demonstrated. No evidence of tricuspid stenosis. Aortic Valve: The aortic valve is normal in structure. Aortic valve regurgitation is not visualized. No aortic stenosis is present. Pulmonic Valve: The pulmonic valve was normal in structure. Pulmonic valve regurgitation is not visualized. No evidence of pulmonic stenosis. Aorta: Aortic dilatation noted. There is mild dilatation of the ascending aorta measuring 39 mm. Venous: The inferior vena cava is normal in size with greater than 50% respiratory variability, suggesting right atrial pressure of 3 mmHg. IAS/Shunts: No atrial level shunt detected by color flow Doppler.  LEFT VENTRICLE PLAX 2D LVIDd:         4.56 cm  Diastology LVIDs:         3.58 cm  LV e' lateral:   9.03 cm/s LV PW:         1.24 cm  LV E/e' lateral: 7.5 LV IVS:        1.23 cm  LV e' medial:    4.68 cm/s LVOT diam:     2.35 cm  LV E/e' medial:  14.5 LV SV:         104 LV SV Index:   45 LVOT Area:     4.34 cm  RIGHT VENTRICLE RV S prime:     9.03 cm/s TAPSE (M-mode): 2.0 cm LEFT ATRIUM           Index LA diam:      3.40 cm 1.46 cm/m LA Vol (A4C): 62.9 ml 27.09 ml/m  AORTIC VALVE LVOT Vmax:   117.00 cm/s LVOT Vmean:  84.300 cm/s LVOT VTI:    0.239 m  AORTA Ao Root diam: 3.90 cm MITRAL VALVE MV Area (PHT): 3.65 cm    SHUNTS MV Decel Time: 208 msec    Systemic VTI:  0.24 m MV E velocity: 67.70 cm/s  Systemic Diam: 2.35 cm MV A velocity: 50.10 cm/s MV E/A ratio:  1.35 Candee Furbish MD Electronically signed by Candee Furbish MD Signature Date/Time: 10/31/2019/1:53:07 PM    Final    VAS [place] LOWER EXTREMITY VENOUS (DVT)  Result Date: 10/31/2019  Lower  Venous DVTStudy Indications: Swelling, and Edema.  Limitations: Poor ultrasound/tissue interface and body habitus. Comparison Study: no prior Performing Technologist: Abram Sander RVS  Examination Guidelines: A complete evaluation includes B-mode imaging, spectral Doppler, color Doppler, and power Doppler as needed of all accessible portions of each vessel. Bilateral testing is considered an integral part of a complete examination. Limited examinations for reoccurring indications may be performed as noted. The reflux portion of the exam is performed with the patient in reverse Trendelenburg.  +---------+---------------+---------+-----------+----------+--------------+ RIGHT    CompressibilityPhasicitySpontaneityPropertiesThrombus Aging +---------+---------------+---------+-----------+----------+--------------+ CFV      Full           Yes      Yes                                 +---------+---------------+---------+-----------+----------+--------------+ SFJ      Full                                                        +---------+---------------+---------+-----------+----------+--------------+ FV Prox  Full                                                        +---------+---------------+---------+-----------+----------+--------------+ FV Mid   Full                                                        +---------+---------------+---------+-----------+----------+--------------+ FV Distal                                             Not visualized +---------+---------------+---------+-----------+----------+--------------+ PFV      Full                                                        +---------+---------------+---------+-----------+----------+--------------+ POP      Full           Yes      Yes                                 +---------+---------------+---------+-----------+----------+--------------+ PTV  Not visualized +---------+---------------+---------+-----------+----------+--------------+ PERO                                                  Not visualized +---------+---------------+---------+-----------+----------+--------------+   +---------+---------------+---------+-----------+----------+--------------+ LEFT     CompressibilityPhasicitySpontaneityPropertiesThrombus Aging +---------+---------------+---------+-----------+----------+--------------+ CFV      Full           Yes      Yes                                 +---------+---------------+---------+-----------+----------+--------------+ SFJ      Full                                                        +---------+---------------+---------+-----------+----------+--------------+ FV Prox  Full                                                        +---------+---------------+---------+-----------+----------+--------------+ FV Mid                                                Not visualized +---------+---------------+---------+-----------+----------+--------------+ FV Distal                                             Not visualized +---------+---------------+---------+-----------+----------+--------------+ PFV      Full                                                        +---------+---------------+---------+-----------+----------+--------------+ POP      Full           Yes      Yes                                 +---------+---------------+---------+-----------+----------+--------------+ PTV                                                   Not visualized +---------+---------------+---------+-----------+----------+--------------+ PERO                                                  Not visualized +---------+---------------+---------+-----------+----------+--------------+     Summary: BILATERAL: -No evidence of popliteal cyst, bilaterally. RIGHT: - There is no evidence of  deep vein thrombosis in the  lower extremity. However, portions of this examination were limited- see technologist comments above.  LEFT: - There is no evidence of deep vein thrombosis in the lower extremity. However, portions of this examination were limited- see technologist comments above.  *See table(s) above for measurements and observations. Electronically signed by Deitra Mayo MD on 10/31/2019 at 2:50:09 PM.    Final

## 2019-11-04 LAB — CBC WITH DIFFERENTIAL/PLATELET
Abs Immature Granulocytes: 0.27 10*3/uL — ABNORMAL HIGH (ref 0.00–0.07)
Basophils Absolute: 0 10*3/uL (ref 0.0–0.1)
Basophils Relative: 0 %
Eosinophils Absolute: 0 10*3/uL (ref 0.0–0.5)
Eosinophils Relative: 0 %
HCT: 28.5 % — ABNORMAL LOW (ref 36.0–46.0)
Hemoglobin: 7.8 g/dL — ABNORMAL LOW (ref 12.0–15.0)
Immature Granulocytes: 5 %
Lymphocytes Relative: 29 %
Lymphs Abs: 1.8 10*3/uL (ref 0.7–4.0)
MCH: 27.1 pg (ref 26.0–34.0)
MCHC: 27.4 g/dL — ABNORMAL LOW (ref 30.0–36.0)
MCV: 99 fL (ref 80.0–100.0)
Monocytes Absolute: 0.5 10*3/uL (ref 0.1–1.0)
Monocytes Relative: 9 %
Neutro Abs: 3.5 10*3/uL (ref 1.7–7.7)
Neutrophils Relative %: 57 %
Platelets: 178 10*3/uL (ref 150–400)
RBC: 2.88 MIL/uL — ABNORMAL LOW (ref 3.87–5.11)
RDW: 14.6 % (ref 11.5–15.5)
WBC: 6 10*3/uL (ref 4.0–10.5)
nRBC: 0 % (ref 0.0–0.2)

## 2019-11-04 LAB — GLUCOSE, CAPILLARY
Glucose-Capillary: 134 mg/dL — ABNORMAL HIGH (ref 70–99)
Glucose-Capillary: 206 mg/dL — ABNORMAL HIGH (ref 70–99)
Glucose-Capillary: 242 mg/dL — ABNORMAL HIGH (ref 70–99)
Glucose-Capillary: 172 mg/dL — ABNORMAL HIGH (ref 70–99)
Glucose-Capillary: 213 mg/dL — ABNORMAL HIGH (ref 70–99)

## 2019-11-04 LAB — CULTURE, BLOOD (ROUTINE X 2)
Culture: NO GROWTH
Culture: NO GROWTH
Special Requests: ADEQUATE
Special Requests: ADEQUATE

## 2019-11-04 LAB — COMPREHENSIVE METABOLIC PANEL
ALT: 17 U/L (ref 0–44)
AST: 20 U/L (ref 15–41)
Albumin: 2.7 g/dL — ABNORMAL LOW (ref 3.5–5.0)
Alkaline Phosphatase: 48 U/L (ref 38–126)
Anion gap: 6 (ref 5–15)
BUN: 32 mg/dL — ABNORMAL HIGH (ref 8–23)
CO2: 37 mmol/L — ABNORMAL HIGH (ref 22–32)
Calcium: 8.8 mg/dL — ABNORMAL LOW (ref 8.9–10.3)
Chloride: 97 mmol/L — ABNORMAL LOW (ref 98–111)
Creatinine, Ser: 0.9 mg/dL (ref 0.44–1.00)
GFR calc Af Amer: >60 mL/min (ref >60)
GFR calc non Af Amer: >60 mL/min (ref >60)
Glucose, Bld: 163 mg/dL — ABNORMAL HIGH (ref 70–99)
Potassium: 4.7 mmol/L (ref 3.5–5.1)
Sodium: 140 mmol/L (ref 135–145)
Total Bilirubin: 0.4 mg/dL (ref 0.3–1.2)
Total Protein: 5.7 g/dL — ABNORMAL LOW (ref 6.5–8.1)

## 2019-11-04 LAB — C-REACTIVE PROTEIN: CRP: 0.8 mg/dL (ref <1.0)

## 2019-11-04 LAB — FERRITIN: Ferritin: 24 ng/mL (ref 11–307)

## 2019-11-04 LAB — D-DIMER, QUANTITATIVE: D-Dimer, Quant: 2.43 ug/mL-FEU — ABNORMAL HIGH (ref 0.00–0.50)

## 2019-11-04 MED ORDER — PREDNISONE 20 MG PO TABS
30.0000 mg | ORAL_TABLET | Freq: Every day | ORAL | Status: DC
  Administered 2019-11-05: 30 mg via ORAL
  Filled 2019-11-04: qty 1

## 2019-11-04 MED ORDER — ENOXAPARIN SODIUM 80 MG/0.8ML ~~LOC~~ SOLN
0.5000 mg/kg | SUBCUTANEOUS | Status: DC
  Administered 2019-11-05 – 2019-11-07 (×3): 62.5 mg via SUBCUTANEOUS
  Filled 2019-11-04 (×3): qty 0.8

## 2019-11-04 MED ORDER — FUROSEMIDE 40 MG PO TABS
40.0000 mg | ORAL_TABLET | Freq: Every day | ORAL | Status: DC
  Administered 2019-11-05 – 2019-11-07 (×3): 40 mg via ORAL
  Filled 2019-11-04 (×3): qty 1

## 2019-11-04 NOTE — TOC Progression Note (Signed)
Transition of Care [facility]) - Progression Note    Patient Details  Name: Cassandra Barnes MRN: [id number] Date of Birth: 1946-02-02  Transition of Care [facility]) CM/SW [place], LCSW Phone Number: 11/04/2019, 10:36 AM  Clinical Narrative:    Insurance authorization still pending.   Expected Discharge Plan: Skilled Nursing Facility Barriers to Discharge: Insurance Authorization  Expected Discharge Plan and Services Expected Discharge Plan: [place] In-house Referral: Clinical Social Work     Living arrangements for the past 2 months: Single Family Home                                       Social Determinants of Health (SDOH) Interventions    Readmission Risk Interventions No flowsheet data found.

## 2019-11-04 NOTE — Progress Notes (Addendum)
PROGRESS NOTE                                                                                                                                                                                                             Patient Demographics:    Cassandra Barnes, is a 74 y.o. female, DOB - [birth year], ENI:[id number]  Outpatient Primary MD for the patient is Cher Nakai, MD   Admit date - 10/30/2019   LOS - 5  Chief Complaint  Patient presents with  . Shortness of Breath  . Fever       Brief Narrative: Patient is a 74 y.o. female with PMHx of COVID-19 infection sometime in May 2021-( hospitalized at [place]), COPD on 2 L of oxygen at home, chronic diastolic heart failure, HTN, DM-2, bedridden with chronic heel ulcers-presented with shortness of breath, fever along with confusion-found to have hypercarbia, possible COVID-19 pneumonitis-and subsequently admitted to the hospitalist service.  See below for further details.  Significant Events: 7/26>> admit to [facility] for confusion/hypercarbia/COVID-19 pneumonia 7/26>> transfer to 42M-as patient developed lethargy-ABG with hypercarbia-briefly required BiPAP. 7/27>> liberated off BiPAP-awake/alert -transfer to 5W  Significant studies: 7/27>> echo: EF 61-44%, grade 2 diastolic dysfunction.  Interventricular septum flattened-consistent with volume overload. 7/27>> CTA chest: No PE, bilateral lower lobe infiltrates worse on the right, changes of cirrhosis of the liver 7/27>> lower extremity Doppler: No DVT  COVID-19 medications: Steroids: 7/26>> Remdesivir: 7/27> 7/31  Antibiotics: Rocephin: 7/26>> 7/30 Zithromax: 7/26>>7/29  Microbiology data: 7/26: Blood culture>> negative so far 7/26: Urine culture>> Enterococcus faecalis  Procedures: None  Consults: None  DVT prophylaxis: SCDs Start: 10/30/19 1429    Subjective:   Lying comfortably in bed-feels weak but no chest pain  or shortness of breath.   Assessment  & Plan :   Sepsis secondary to pneumonia: Sepsis physiology has significantly improved-suspect pneumonia mostly from bacterial pneumonia-not sure if patient has COVID-19 pneumonia at this point-she has a history of having COVID-19 pneumonia in May 2021 requiring hospitalization in [facility] in [place].  Has finished a course of empiric antibiotics (Rocephin/Zithromax) and Remdesivir.  Continue tapering steroids.    Fever: afebrile O2 requirements:  SpO2: 95 % O2 Flow Rate (L/min): 2 L/min FiO2 (%): 40 %   COVID-19 Labs: Recent Labs    11/02/19 0402 11/03/19 0559 11/04/19 0521  DDIMER 2.42*  2.11* 2.43*  FERRITIN 30 28 24   CRP 2.8* 1.2* 0.8       Component Value Date/Time   BNP 178.3 (H) 10/30/2019 1530    Recent Labs  Lab 10/30/19 1530  PROCALCITON 0.49    Lab Results  Component Value Date   SARSCOV2NAA POSITIVE (A) 10/30/2019     Prone/Incentive Spirometry:  incentive spirometry use 3-4/hour.  Acute metabolic encephalopathy: Likely secondary to hypercarbia-resolved-she is completely awake and alert-she briefly required BiPAP-however no BiPAP requirement over the past 2-3 days  Acute on chronic hypoxic/hypercarbic respiratory failure: Suspect multifactorial from pneumonia and possible decompensated diastolic heart failure.  Respiratory failure improved-back on usual 2 L - 3 L of oxygen.  Acute on chronic diastolic heart failure: Volume status is significantly improved-has hypoalbuminemia which may be contributing to edema as well-also has incidental finding of possible cirrhosis on CT chest-change to oral Lasix-continue to keep in negative balance.      Asymptomatic bacteriuria: Urine culture positive for Enterococcus faecalis-she has no symptoms of UTI-her presenting symptoms on admission were all consistent with pneumonia.  Has improved with pneumonia treatment.  Doubt UTI requires any treatment at this point  COPD  with chronic hypoxic/hypercarbic respiratory failure: Does not appear to be in exacerbation-no wheezing-but remains at risk for hypercarbia--continue bronchodilators-avoid/minimize narcotics/benzos as much as possible.  AKI: Appears to be mild-likely hemodynamically mediated-improved with supportive care-remains volume overloaded-continue with diuretics cautiously.  Anemia: Normocytic anemia-likely secondary to acute illness-appears to have some amount of chronic anemia as well-no indication of blood loss.  No indication to transfuse-follow CBC periodically  Hypotension: BP much stable-change midodrine to twice daily dosing-suspect we could discontinue the next day or so. Hypotension probably related to sepsis physiology-no indication of volume loss or cardiogenic shock.  Hyperkalemia: Resolved with Lokelma and Lasix.  HTN: Hold all antihypertensives due to hypotension-patient on midodrine.  DM-2: CBGs stable-continue Lantus 15 units twice daily, tenets of NovoLog with meals and SSI-suspected steroids get tapered down-we will need further adjustment of insulin regimen.   Recent Labs    11/03/19 2109 11/04/19 0748 11/04/19 1218  GLUCAP 384* 134* 206*   CT chest evidence of evidence of cirrhosis: Stable for outpatient follow-up-we will be on diuretics in any event.  Peripheral neuropathy: Continue Neurontin  Bilateral lower extremity heel ulcers: Chronic issue-appreciate wound care input.  Bedbound with significant debility/deconditioning at baseline for more than 1 year  Palliative care discussion: Bedbound for more than a year-COPD with chronic hypoxemia/hypercarbia on 2 L of oxygen at home-with encephalopathy and worsening respiratory failure from pneumonia/CHF.  Discussed with patient at length regarding CODE STATUS-after discussion with family-she remains a full code.  Overall long-term prognosis is poor given extensive medical comorbidities and bedbound status.  Morbid  Obesity: Estimated body mass index is 42.1 kg/m as calculated from the following:   Height as of this encounter: 5\' 8"  (1.727 m).   Weight as of this encounter: 125.6 kg.   RN pressure injury documentation: Pressure Injury 10/30/19 Heel Right;Left Unstageable - Full thickness tissue loss in which the base of the injury is covered by slough (yellow, tan, gray, green or brown) and/or eschar (tan, brown or black) in the wound bed. (Active)  10/30/19   Location: Heel  Location Orientation: Right;Left  Staging: Unstageable - Full thickness tissue loss in which the base of the injury is covered by slough (yellow, tan, gray, green or brown) and/or eschar (tan, brown or black) in the wound bed.  Wound Description (Comments):  Present on Admission: Yes     Pressure Injury 11/02/19 Heel Left Unstageable - Full thickness tissue loss in which the base of the injury is covered by slough (yellow, tan, gray, green or brown) and/or eschar (tan, brown or black) in the wound bed. (Active)  11/02/19 1606  Location: Heel  Location Orientation: Left  Staging: Unstageable - Full thickness tissue loss in which the base of the injury is covered by slough (yellow, tan, gray, green or brown) and/or eschar (tan, brown or black) in the wound bed.  Wound Description (Comments):   Present on Admission: Yes    GI prophylaxis: PPI  ABG:    Component Value Date/Time   PHART 7.320 (L) 10/31/2019 0207   PCO2ART 74.9 (HH) 10/31/2019 0207   PO2ART 96 10/31/2019 0207   HCO3 38.7 (H) 10/31/2019 0207   TCO2 41 (H) 10/31/2019 0207   O2SAT 96.0 10/31/2019 0207    Vent Settings:     Condition - Guarded  Family Communication  :  Daughter updated over the phone on 7/29  Code Status :  Full Code  Diet :  Diet Order            Diet Carb Modified Fluid consistency: Thin; Room service appropriate? Yes with Assist; Fluid restriction: 1500 mL Fluid  Diet effective now                  Disposition Plan  :   Status is: Inpatient  Remains inpatient appropriate because:Inpatient level of care appropriate due to severity of illness   Dispo: The patient is from: Home              Anticipated d/c is to: Home              Anticipated d/c date is: > 1 days              Patient currently is  medically stable to d/c.  Barriers to discharge: Awaiting SNF bed/insurance authorization  Antimicorbials  :    Anti-infectives (From admission, onward)   Start     Dose/Rate Route Frequency Ordered Stop   11/01/19 1000  remdesivir 100 mg in sodium chloride 0.9 % 100 mL IVPB        100 mg 200 mL/hr over 30 Minutes Intravenous Daily 10/31/19 0749 11/04/19 0944   10/31/19 1400  vancomycin (VANCOREADY) IVPB 1500 mg/300 mL  Status:  Discontinued        1,500 mg 150 mL/hr over 120 Minutes Intravenous Every 24 hours 10/30/19 1311 10/30/19 1457   10/31/19 0830  remdesivir 200 mg in sodium chloride 0.9% 250 mL IVPB        200 mg 580 mL/hr over 30 Minutes Intravenous Once 10/31/19 0749 10/31/19 1730   10/30/19 1730  piperacillin-tazobactam (ZOSYN) IVPB 3.375 g  Status:  Discontinued        3.375 g 12.5 mL/hr over 240 Minutes Intravenous Every 8 hours 10/30/19 1311 10/30/19 1457   10/30/19 1600  cefTRIAXone (ROCEPHIN) 1 g in sodium chloride 0.9 % 100 mL IVPB        1 g 200 mL/hr over 30 Minutes Intravenous Every 24 hours 10/30/19 1432 11/03/19 1650   10/30/19 1530  azithromycin (ZITHROMAX) 500 mg in sodium chloride 0.9 % 250 mL IVPB        500 mg 250 mL/hr over 60 Minutes Intravenous Every 24 hours 10/30/19 1432 11/02/19 1143   10/30/19 1200  vancomycin (VANCOCIN) 2,500 mg in sodium chloride 0.9 %  500 mL IVPB        2,500 mg 250 mL/hr over 120 Minutes Intravenous  Once 10/30/19 1148 10/30/19 1648   10/30/19 1130  piperacillin-tazobactam (ZOSYN) IVPB 3.375 g        3.375 g 100 mL/hr over 30 Minutes Intravenous  Once 10/30/19 1116 10/30/19 1516      Inpatient Medications  Scheduled Meds: . albuterol  2  puff Inhalation Q6H  . vitamin C  500 mg Oral Daily  . aspirin EC  81 mg Oral Daily  . Chlorhexidine Gluconate Cloth  6 each Topical Daily  . colchicine  0.6 mg Oral Daily  . collagenase   Topical Daily  . [START ON 11/05/2019] enoxaparin (LOVENOX) injection  0.5 mg/kg Subcutaneous Q24H  . ferrous sulfate  325 mg Oral Q breakfast  . furosemide  40 mg Intravenous Daily  . gabapentin  600 mg Oral BID  . insulin aspart  0-20 Units Subcutaneous TID WC  . insulin aspart  0-5 Units Subcutaneous QHS  . insulin aspart  10 Units Subcutaneous TID WC  . insulin glargine  50 Units Subcutaneous BID  . mouth rinse  15 mL Mouth Rinse BID  . methylPREDNISolone (SOLU-MEDROL) injection  20 mg Intravenous Q12H  . midodrine  5 mg Oral BID WC  . pantoprazole  40 mg Oral BID  . umeclidinium bromide  1 puff Inhalation Daily  . zinc sulfate  220 mg Oral Daily   Continuous Infusions:  PRN Meds:.acetaminophen, albuterol, guaiFENesin-dextromethorphan, ipratropium-albuterol, ondansetron **OR** ondansetron (ZOFRAN) IV, polyvinyl alcohol   Time Spent in minutes  25    See all Orders from today for further details   Oren Binet M.D on 11/04/2019 at 2:54 PM  To page go to [email or web address] - use universal password  [facility] -  Office  [phone]    Objective:   Vitals:   11/03/19 1211 11/03/19 2014 11/04/19 0500 11/04/19 0610  BP: 128/70     Pulse: 56     Resp: 17     Temp: 98.7 F (37.1 C) 99.1 F (37.3 C) 98.8 F (37.1 C)   TempSrc: Axillary Oral Oral   SpO2: 95%     Weight:    (!) 125.6 kg  Height:        Wt Readings from Last 3 Encounters:  11/04/19 (!) 125.6 kg  02/10/14 122.5 kg     Intake/Output Summary (Last 24 hours) at 11/04/2019 1454 Last data filed at 11/04/2019 4967 Gross per 24 hour  Intake 222 ml  Output 2000 ml  Net -1778 ml     Physical Exam Gen Exam:Alert awake-not in any distress HEENT:atraumatic, normocephalic Chest: B/L clear to auscultation  anteriorly CVS:S1S2 regular Abdomen:soft non tender, non distended Extremities:+ edema Neurology: Non focal Skin: no rash   Data Review:    CBC Recent Labs  Lab 10/31/19 0343 11/01/19 0424 11/02/19 0402 11/03/19 0559 11/04/19 0521  WBC 10.7* 8.8 6.9 4.9 6.0  HGB 9.2* 8.2* 7.9* 7.7* 7.8*  HCT 33.0* 28.7* 28.5* 28.1* 28.5*  PLT 172 185 201 179 178  MCV 100.0 96.6 97.6 98.9 99.0  MCH 27.9 27.6 27.1 27.1 27.1  MCHC 27.9* 28.6* 27.7* 27.4* 27.4*  RDW 14.7 14.6 14.7 14.5 14.6  LYMPHSABS 1.4 1.4 1.4 1.2 1.8  MONOABS 0.2 0.3 0.3 0.4 0.5  EOSABS 0.0 0.0 0.0 0.0 0.0  BASOSABS 0.0 0.0 0.0 0.0 0.0    Chemistries  Recent Labs  Lab 10/31/19 0343 11/01/19 0424 11/02/19 0402 11/03/19 0559 11/04/19  0521  NA 137 135 136 139 140  K 5.8* 5.1 4.8 4.5 4.7  CL 97* 93* 94* 98 97*  CO2 27 33* 33* 30 37*  GLUCOSE 266* 195* 208* 204* 163*  BUN 26* 34* 37* 34* 32*  CREATININE 1.27* 0.83 0.92 0.78 0.90  CALCIUM 8.7* 8.5* 8.6* 8.7* 8.8*  MG 1.9 2.1 2.4 2.3  --   AST 27 19 15 16 20   ALT 17 15 13 12 17   ALKPHOS 61 56 49 51 48  BILITOT 0.5 0.3 0.3 0.3 0.4   ------------------------------------------------------------------------------------------------------------------ No results for input(s): CHOL, HDL, LDLCALC, TRIG, CHOLHDL, LDLDIRECT in the last 72 hours.  Lab Results  Component Value Date   HGBA1C 6.4 (H) 11/01/2019   ------------------------------------------------------------------------------------------------------------------ No results for input(s): TSH, T4TOTAL, T3FREE, THYROIDAB in the last 72 hours.  Invalid input(s): FREET3 ------------------------------------------------------------------------------------------------------------------ Recent Labs    11/03/19 0559 11/04/19 0521  FERRITIN 28 24    Coagulation profile Recent Labs  Lab 10/30/19 1100  INR 1.1    Recent Labs    11/03/19 0559 11/04/19 0521  DDIMER 2.11* 2.43*    Cardiac Enzymes No  results for input(s): CKMB, TROPONINI, MYOGLOBIN in the last 168 hours.  Invalid input(s): CK ------------------------------------------------------------------------------------------------------------------    Component Value Date/Time   BNP 178.3 (H) 10/30/2019 1530    Micro Results Recent Results (from the past 240 hour(s))  Culture, blood (Routine x 2)     Status: None   Collection Time: 10/30/19 11:00 AM   Specimen: BLOOD LEFT FOREARM  Result Value Ref Range Status   Specimen Description BLOOD LEFT FOREARM  Final   Special Requests   Final    BOTTLES DRAWN AEROBIC AND ANAEROBIC Blood Culture adequate volume   Culture   Final    NO GROWTH 5 DAYS Performed at [facility], [address] [place], [place] [address]    Report Status 11/04/2019 FINAL  Final  SARS Coronavirus 2 by RT PCR (hospital order, performed in [facility] hospital lab) Nasopharyngeal Nasopharyngeal Swab     Status: Abnormal   Collection Time: 10/30/19 11:33 AM   Specimen: Nasopharyngeal Swab  Result Value Ref Range Status   SARS Coronavirus 2 POSITIVE (A) NEGATIVE Final    Comment: RESULT CALLED TO, READ BACK BY AND VERIFIED WITH: Lowell Bouton RN 13:40 10/30/19 ([email or web address]) (NOTE) SARS-CoV-2 target nucleic acids are DETECTED  SARS-CoV-2 RNA is generally detectable in upper respiratory specimens  during the acute phase of infection.  Positive results are indicative  of the presence of the identified virus, but do not rule out bacterial infection or co-infection with other pathogens not detected by the test.  Clinical correlation with patient history and  other diagnostic information is necessary to determine patient infection status.  The expected result is negative.  Fact Sheet for Patients:   StrictlyIdeas.no   Fact Sheet for Healthcare Providers:   BankingDealers.co.za    This test is not yet approved or cleared by the [place] FDA and  has been  authorized for detection and/or diagnosis of SARS-CoV-2 by FDA under an Emergency Use Authorization (EUA).  This EUA will remain in effect (meaning this te st can be used) for the duration of  the COVID-19 declaration under Section 564(b)(1) of the Act, 21 U.S.C. section 360-bbb-3(b)(1), unless the authorization is terminated or revoked sooner.  Performed at [facility], [place] [address], [place], [place] [address]   Culture, Urine     Status: Abnormal   Collection  Time: 10/30/19  1:34 PM   Specimen: Urine, Random  Result Value Ref Range Status   Specimen Description URINE, RANDOM  Final   Special Requests   Final    NONE Performed at [facility], [address], [place], [place] [address]    Culture >=100,000 COLONIES/mL ENTEROCOCCUS FAECIUM (A)  Final   Report Status 11/03/2019 FINAL  Final   Organism ID, Bacteria ENTEROCOCCUS FAECIUM (A)  Final      Susceptibility   Enterococcus faecium - MIC*    AMPICILLIN >=32 RESISTANT Resistant     NITROFURANTOIN 64 INTERMEDIATE Intermediate     VANCOMYCIN <=0.5 SENSITIVE Sensitive     * >=100,000 COLONIES/mL ENTEROCOCCUS FAECIUM  Culture, blood (Routine x 2)     Status: None   Collection Time: 10/30/19  3:36 PM   Specimen: BLOOD  Result Value Ref Range Status   Specimen Description BLOOD RIGHT ANTECUBITAL  Final   Special Requests   Final    BOTTLES DRAWN AEROBIC ONLY Blood Culture adequate volume   Culture   Final    NO GROWTH 5 DAYS Performed at [facility], [place] [address] [place], [place] [address]    Report Status 11/04/2019 FINAL  Final  MRSA PCR Screening     Status: None   Collection Time: 11/01/19  5:48 AM   Specimen: Nasal Mucosa; Nasopharyngeal  Result Value Ref Range Status   MRSA by PCR NEGATIVE NEGATIVE Final    Comment:        The GeneXpert MRSA Assay (FDA approved for NASAL specimens only), is one component of a comprehensive MRSA colonization surveillance program. It is not intended to  diagnose MRSA infection nor to guide or monitor treatment for MRSA infections. Performed at [facility], [place] [address] [place], [place] [address]     Radiology Reports CT ANGIO CHEST PE W OR WO CONTRAST  Result Date: 10/31/2019 CLINICAL DATA:  Fever and COVID-19 positivity with shortness of breath EXAM: CT ANGIOGRAPHY CHEST WITH CONTRAST TECHNIQUE: Multidetector CT imaging of the chest was performed using the standard protocol during bolus administration of intravenous contrast. Multiplanar CT image reconstructions and MIPs were obtained to evaluate the vascular anatomy. CONTRAST:  32mL OMNIPAQUE IOHEXOL 350 MG/ML SOLN COMPARISON:  10/30/2019 FINDINGS: Cardiovascular: Thoracic aorta demonstrates atherosclerotic calcifications without aneurysmal dilatation. The degree of opacification is somewhat limited. Stable cardiomegaly is noted. Pulmonary artery demonstrates a normal branching pattern. No definitive filling defect to suggest pulmonary embolism is noted. Coronary calcifications are noted. Mediastinum/Nodes: Thoracic inlet is within normal limits. Scattered hilar and mediastinal lymph nodes are noted slightly more prominent than that seen on the prior exam particularly in the subcarinal region. These are felt to be reactive in nature. The esophagus as visualized is within normal limits. Lungs/Pleura: Emphysematous changes are noted in the lungs bilaterally. Patchy bilateral lower lobe infiltrates are noted right greater than left. The degree of aeration has improved although some worsening of the infiltrate is noted particularly in the superior aspect of the right lower lobe. Upper Abdomen: Upper abdomen demonstrates cirrhotic change of the liver. No other focal abnormality is seen. Musculoskeletal: Degenerative changes of the thoracic spine are noted. Review of the MIP images confirms the above findings. IMPRESSION: No evidence of pulmonary emboli. Bilateral lower lobe waxing and waning  infiltrates worse on the right. No sizable effusions are seen. Reactive hilar and mediastinal adenopathy is noted. Changes of cirrhosis of the liver. Aortic Atherosclerosis (ICD10-I70.0). Electronically Signed  By: Inez Catalina M.D.   On: 10/31/2019 01:33   DG Chest Port 1 View  Result Date: 10/30/2019 CLINICAL DATA:  Shortness of breath, fever EXAM: PORTABLE CHEST 1 VIEW COMPARISON:  08/12/2019 FINDINGS: Bilateral perihilar and lower lung opacities. No significant pleural effusion. No pneumothorax. Cardiomegaly. IMPRESSION: Bilateral perihilar lower lung opacities may reflect multifocal pneumonia or edema. Cardiomegaly. Electronically Signed   By: Macy Mis M.D.   On: 10/30/2019 12:10   ECHOCARDIOGRAM COMPLETE  Result Date: 10/31/2019    ECHOCARDIOGRAM REPORT   Patient Name:   Cassandra Barnes Date of Exam: 10/31/2019 Medical Rec #:  [id number]       Height:       68.0 in Accession #:    [id number]      Weight:       270.0 lb Date of Birth:  1946/01/12        BSA:          2.322 m Patient Age:    46 years        BP:           97/64 mmHg Patient Gender: F               HR:           66 bpm. Exam Location:  Inpatient Procedure: 2D Echo Indications:    elevated troponin  History:        Patient has no prior history of Echocardiogram examinations.                 CHF, COPD; Risk Factors:Hypertension, Diabetes and Former                 Smoker. Covid + patient.  Sonographer:    Jannett Celestine RDCS (AE) Referring Phys: [id number] Mckinley Jewel  Sonographer Comments: Patient is morbidly obese. Image acquisition challenging due to patient body habitus. immobile patient. off axis apical windows IMPRESSIONS  1. Left ventricular ejection fraction, by estimation, is 55 to 60%. The left ventricle has normal function. The left ventricle has no regional wall motion abnormalities. There is mild left ventricular hypertrophy. Left ventricular diastolic parameters are consistent with Grade II diastolic dysfunction  (pseudonormalization). There is the interventricular septum is flattened in systole and diastole, consistent with right ventricular pressure and volume overload.  2. Right ventricular systolic function is normal. The right ventricular size is mildly enlarged.  3. The mitral valve is normal in structure. Trivial mitral valve regurgitation. No evidence of mitral stenosis.  4. The aortic valve is normal in structure. Aortic valve regurgitation is not visualized. No aortic stenosis is present.  5. Aortic dilatation noted. There is mild dilatation of the ascending aorta measuring 39 mm.  6. The inferior vena cava is normal in size with greater than 50% respiratory variability, suggesting right atrial pressure of 3 mmHg. FINDINGS  Left Ventricle: Left ventricular ejection fraction, by estimation, is 55 to 60%. The left ventricle has normal function. The left ventricle has no regional wall motion abnormalities. The left ventricular internal cavity size was normal in size. There is  mild left ventricular hypertrophy. The interventricular septum is flattened in systole and diastole, consistent with right ventricular pressure and volume overload. Left ventricular diastolic parameters are consistent with Grade II diastolic dysfunction  (pseudonormalization). Right Ventricle: The right ventricular size is mildly enlarged. No increase in right ventricular wall thickness. Right ventricular systolic function is normal. Left Atrium: Left atrial size was normal in size. Right Atrium:  Right atrial size was normal in size. Pericardium: There is no evidence of pericardial effusion. Mitral Valve: The mitral valve is normal in structure. Normal mobility of the mitral valve leaflets. Trivial mitral valve regurgitation. No evidence of mitral valve stenosis. Tricuspid Valve: The tricuspid valve is normal in structure. Tricuspid valve regurgitation is not demonstrated. No evidence of tricuspid stenosis. Aortic Valve: The aortic valve is  normal in structure. Aortic valve regurgitation is not visualized. No aortic stenosis is present. Pulmonic Valve: The pulmonic valve was normal in structure. Pulmonic valve regurgitation is not visualized. No evidence of pulmonic stenosis. Aorta: Aortic dilatation noted. There is mild dilatation of the ascending aorta measuring 39 mm. Venous: The inferior vena cava is normal in size with greater than 50% respiratory variability, suggesting right atrial pressure of 3 mmHg. IAS/Shunts: No atrial level shunt detected by color flow Doppler.  LEFT VENTRICLE PLAX 2D LVIDd:         4.56 cm  Diastology LVIDs:         3.58 cm  LV e' lateral:   9.03 cm/s LV PW:         1.24 cm  LV E/e' lateral: 7.5 LV IVS:        1.23 cm  LV e' medial:    4.68 cm/s LVOT diam:     2.35 cm  LV E/e' medial:  14.5 LV SV:         104 LV SV Index:   45 LVOT Area:     4.34 cm  RIGHT VENTRICLE RV S prime:     9.03 cm/s TAPSE (M-mode): 2.0 cm LEFT ATRIUM           Index LA diam:      3.40 cm 1.46 cm/m LA Vol (A4C): 62.9 ml 27.09 ml/m  AORTIC VALVE LVOT Vmax:   117.00 cm/s LVOT Vmean:  84.300 cm/s LVOT VTI:    0.239 m  AORTA Ao Root diam: 3.90 cm MITRAL VALVE MV Area (PHT): 3.65 cm    SHUNTS MV Decel Time: 208 msec    Systemic VTI:  0.24 m MV E velocity: 67.70 cm/s  Systemic Diam: 2.35 cm MV A velocity: 50.10 cm/s MV E/A ratio:  1.35 Candee Furbish MD Electronically signed by Candee Furbish MD Signature Date/Time: 10/31/2019/1:53:07 PM    Final    VAS [place] LOWER EXTREMITY VENOUS (DVT)  Result Date: 10/31/2019  Lower Venous DVTStudy Indications: Swelling, and Edema.  Limitations: Poor ultrasound/tissue interface and body habitus. Comparison Study: no prior Performing Technologist: Abram Sander RVS  Examination Guidelines: A complete evaluation includes B-mode imaging, spectral Doppler, color Doppler, and power Doppler as needed of all accessible portions of each vessel. Bilateral testing is considered an integral part of a complete examination. Limited  examinations for reoccurring indications may be performed as noted. The reflux portion of the exam is performed with the patient in reverse Trendelenburg.  +---------+---------------+---------+-----------+----------+--------------+ RIGHT    CompressibilityPhasicitySpontaneityPropertiesThrombus Aging +---------+---------------+---------+-----------+----------+--------------+ CFV      Full           Yes      Yes                                 +---------+---------------+---------+-----------+----------+--------------+ SFJ      Full                                                        +---------+---------------+---------+-----------+----------+--------------+  FV Prox  Full                                                        +---------+---------------+---------+-----------+----------+--------------+ FV Mid   Full                                                        +---------+---------------+---------+-----------+----------+--------------+ FV Distal                                             Not visualized +---------+---------------+---------+-----------+----------+--------------+ PFV      Full                                                        +---------+---------------+---------+-----------+----------+--------------+ POP      Full           Yes      Yes                                 +---------+---------------+---------+-----------+----------+--------------+ PTV                                                   Not visualized +---------+---------------+---------+-----------+----------+--------------+ PERO                                                  Not visualized +---------+---------------+---------+-----------+----------+--------------+   +---------+---------------+---------+-----------+----------+--------------+ LEFT     CompressibilityPhasicitySpontaneityPropertiesThrombus Aging  +---------+---------------+---------+-----------+----------+--------------+ CFV      Full           Yes      Yes                                 +---------+---------------+---------+-----------+----------+--------------+ SFJ      Full                                                        +---------+---------------+---------+-----------+----------+--------------+ FV Prox  Full                                                        +---------+---------------+---------+-----------+----------+--------------+ FV Mid  Not visualized +---------+---------------+---------+-----------+----------+--------------+ FV Distal                                             Not visualized +---------+---------------+---------+-----------+----------+--------------+ PFV      Full                                                        +---------+---------------+---------+-----------+----------+--------------+ POP      Full           Yes      Yes                                 +---------+---------------+---------+-----------+----------+--------------+ PTV                                                   Not visualized +---------+---------------+---------+-----------+----------+--------------+ PERO                                                  Not visualized +---------+---------------+---------+-----------+----------+--------------+     Summary: BILATERAL: -No evidence of popliteal cyst, bilaterally. RIGHT: - There is no evidence of deep vein thrombosis in the lower extremity. However, portions of this examination were limited- see technologist comments above.  LEFT: - There is no evidence of deep vein thrombosis in the lower extremity. However, portions of this examination were limited- see technologist comments above.  *See table(s) above for measurements and observations. Electronically signed by Deitra Mayo MD on  10/31/2019 at 2:50:09 PM.    Final

## 2019-11-05 DIAGNOSIS — I5033 Acute on chronic diastolic (congestive) heart failure: Secondary | ICD-10-CM

## 2019-11-05 LAB — BASIC METABOLIC PANEL
Anion gap: 6 (ref 5–15)
BUN: 28 mg/dL — ABNORMAL HIGH (ref 8–23)
CO2: 40 mmol/L — ABNORMAL HIGH (ref 22–32)
Calcium: 9 mg/dL (ref 8.9–10.3)
Chloride: 97 mmol/L — ABNORMAL LOW (ref 98–111)
Creatinine, Ser: 0.87 mg/dL (ref 0.44–1.00)
GFR calc Af Amer: >60 mL/min (ref >60)
GFR calc non Af Amer: >60 mL/min (ref >60)
Glucose, Bld: 89 mg/dL (ref 70–99)
Potassium: 4.9 mmol/L (ref 3.5–5.1)
Sodium: 143 mmol/L (ref 135–145)

## 2019-11-05 LAB — GLUCOSE, CAPILLARY
Glucose-Capillary: 78 mg/dL (ref 70–99)
Glucose-Capillary: 155 mg/dL — ABNORMAL HIGH (ref 70–99)
Glucose-Capillary: 157 mg/dL — ABNORMAL HIGH (ref 70–99)
Glucose-Capillary: 188 mg/dL — ABNORMAL HIGH (ref 70–99)

## 2019-11-05 MED ORDER — PREDNISONE 5 MG PO TABS
15.0000 mg | ORAL_TABLET | Freq: Every day | ORAL | Status: DC
  Administered 2019-11-06 – 2019-11-07 (×2): 15 mg via ORAL
  Filled 2019-11-05 (×2): qty 1

## 2019-11-05 NOTE — Progress Notes (Signed)
PROGRESS NOTE    Cassandra Barnes  EHM:[id number] DOB: [birth year] DOA: 10/30/2019 PCP: Cher Nakai, MD    Brief Narrative:  Patient admitted to the hospital with working diagnosis of acute hypoxic respiratory failure due to right lower lobe pneumonia, complicated with sepsis, endorgan failure encephalopathy.  In the setting of recent SARS COVID-19 viral pneumonia.   Patient had Covid pneumonia May 2021, she was treated in [facility] hospital with steroids and antivirals.  She reported 1 week of confusion and dyspnea prior to this hospitalization.  She was also noted to be febrile and worsening oxygen saturations.  Patient was noted to be confused.  On her initial physical examination blood pressure was 102/50, heart rate 70, respirate 15, oxygen saturation 95% on 3 L per nasal cannula.  Her lungs are clear to auscultation, heart S1-S2, present rhythmic, soft abdomen, no extremity edema.  Chest x-ray had a dense patchy right lower lobe infiltrate.   Patient had a prolonged hospitalization, she required noninvasive mechanical ventilation for hypercapnic respiratory failure.  She has been treated with steroids and remdesivir, antibiotic therapy with ceftriaxone and azithromycin.   Assessment & Plan:   Principal Problem:   Sepsis due to COVID-19 [facility]) Active Problems:   Hypertension   AKI (acute kidney injury) ([place])   Diabetes mellitus without complication (HCC)   CHF (congestive heart failure) (HCC)   Normocytic anemia   COPD (chronic obstructive pulmonary disease) ([place])   1. Right lower lobe community acquired pneumonia in the setting of recent COVID 19 viral pneumonia, end organ failure encephalopathy. (acute on chronic hypoxic and hypercapnic respiratory failure) Oxygenation this am is 98% on 2 L/ min per [place]. Wbc 6.0/ cultures have been with no growth.   Patient now has completed antibiotic therapy, continue oxymetry monitoring.   Will start tapering off prednisone.   2. Acute on  chronic diastolic heart failure. Improved volume status, continue close monitoring of blood pressure.   Continue with furosemide daily.   3. Asymptomatic bacteriuria. Urine culture positive for enterococcus, holding on antibiotic therapy, ruled out urine infection  4. AKI with hyperkalemia. Stable renal function with serum cr down to 0,87 with K at 4,9 and serum bicarbonate at 40.   5. HTN/ hypotension. Patient has been on midodrine, will continue close blood pressure monitoring.   6. T2DM. Continue basal insulin regime with 15 units bid of lantus, and insulin sliding scale. 10 units with premeal insulin. Patient is toleration po well.   7. Bilateral heal pressure ulcer, present on admission, not able to stage. Continue with local wound care.   8. Iron deficiency anemia. Continue with iron supplementation.   Status is: Inpatient  Remains inpatient appropriate because:Inpatient level of care appropriate due to severity of illness   Dispo:  Patient From: Home  Planned Disposition: [place]  Expected discharge date: 11/05/19  Medically stable for discharge: Yes   DVT prophylaxis: Enoxaparin   Code Status:   full  Family Communication:  No family at the bedside      Nutrition Status:           Skin Documentation: Pressure Injury 10/30/19 Heel Right;Left Unstageable - Full thickness tissue loss in which the base of the injury is covered by slough (yellow, tan, gray, green or brown) and/or eschar (tan, brown or black) in the wound bed. (Active)  10/30/19   Location: Heel  Location Orientation: Right;Left  Staging: Unstageable - Full thickness tissue loss in which the base of the injury is covered  by slough (yellow, tan, gray, green or brown) and/or eschar (tan, brown or black) in the wound bed.  Wound Description (Comments):   Present on Admission: Yes     Pressure Injury 11/02/19 Heel Left Unstageable - Full thickness tissue loss in which the base of the  injury is covered by slough (yellow, tan, gray, green or brown) and/or eschar (tan, brown or black) in the wound bed. (Active)  11/02/19 1606  Location: Heel  Location Orientation: Left  Staging: Unstageable - Full thickness tissue loss in which the base of the injury is covered by slough (yellow, tan, gray, green or brown) and/or eschar (tan, brown or black) in the wound bed.  Wound Description (Comments):   Present on Admission: Yes        Subjective: Patient continue to be very weak and deconditioned, dyspnea has improved but not yet back to baseline, no nausea or vomiting,   Objective: Vitals:   11/04/19 1605 11/04/19 2120 11/05/19 0511 11/05/19 0628  BP: (!) 124/43  (!) 122/51   Pulse: 64  56   Resp: 17  13   Temp: 98.8 F (37.1 C) 98.4 F (36.9 C) 98 F (36.7 C)   TempSrc: Oral Oral Oral   SpO2: 95%  98%   Weight:    (!) 125.7 kg  Height:        Intake/Output Summary (Last 24 hours) at 11/05/2019 1021 Last data filed at 11/05/2019 0800 Gross per 24 hour  Intake --  Output 2375 ml  Net -2375 ml   Filed Weights   11/03/19 0436 11/04/19 0610 11/05/19 0628  Weight: (!) 125.4 kg (!) 125.6 kg (!) 125.7 kg    Examination:   General: deconditioned  Neurology: Awake and alert, non focal  E ENT: mild pallor, no icterus, oral mucosa moist Cardiovascular: No JVD. S1-S2 present, rhythmic, no gallops, rubs, or murmurs. Trace lower extremity edema. Boots in place.  Pulmonary: positive breath sounds bilaterally,  Gastrointestinal. Abdomen soft and non tender Skin boots in place Musculoskeletal: no joint deformities     Data Reviewed: I have personally reviewed following labs and imaging studies  CBC: Recent Labs  Lab 10/31/19 0343 11/01/19 0424 11/02/19 0402 11/03/19 0559 11/04/19 0521  WBC 10.7* 8.8 6.9 4.9 6.0  NEUTROABS 8.9* 7.1 5.0 3.2 3.5  HGB 9.2* 8.2* 7.9* 7.7* 7.8*  HCT 33.0* 28.7* 28.5* 28.1* 28.5*  MCV 100.0 96.6 97.6 98.9 99.0  PLT 172 185 201 179  419   Basic Metabolic Panel: Recent Labs  Lab 10/31/19 0343 10/31/19 0343 11/01/19 0424 11/02/19 0402 11/03/19 0559 11/04/19 0521 11/05/19 0756  NA 137   < > 135 136 139 140 143  K 5.8*   < > 5.1 4.8 4.5 4.7 4.9  CL 97*   < > 93* 94* 98 97* 97*  CO2 27   < > 33* 33* 30 37* 40*  GLUCOSE 266*   < > 195* 208* 204* 163* 89  BUN 26*   < > 34* 37* 34* 32* 28*  CREATININE 1.27*   < > 0.83 0.92 0.78 0.90 0.87  CALCIUM 8.7*   < > 8.5* 8.6* 8.7* 8.8* 9.0  MG 1.9  --  2.1 2.4 2.3  --   --   PHOS 6.0*  --   --   --   --   --   --    < > = values in this interval not displayed.   GFR: Estimated Creatinine Clearance: 81.8 mL/min (by C-G  formula based on SCr of 0.87 mg/dL). Liver Function Tests: Recent Labs  Lab 10/31/19 0343 11/01/19 0424 11/02/19 0402 11/03/19 0559 11/04/19 0521  AST 27 19 15 16 20   ALT 17 15 13 12 17   ALKPHOS 61 56 49 51 48  BILITOT 0.5 0.3 0.3 0.3 0.4  PROT 6.2* 6.2* 6.4* 6.0* 5.7*  ALBUMIN 2.7* 2.7* 2.8* 2.8* 2.7*   No results for input(s): LIPASE, AMYLASE in the last 168 hours. No results for input(s): AMMONIA in the last 168 hours. Coagulation Profile: Recent Labs  Lab 10/30/19 1100  INR 1.1   Cardiac Enzymes: No results for input(s): CKTOTAL, CKMB, CKMBINDEX, TROPONINI in the last 168 hours. BNP (last 3 results) No results for input(s): PROBNP in the last 8760 hours. HbA1C: No results for input(s): HGBA1C in the last 72 hours. CBG: Recent Labs  Lab 11/04/19 1218 11/04/19 1654 11/04/19 2036 11/04/19 2114 11/05/19 0733  GLUCAP 206* 242* 172* 213* 78   Lipid Profile: No results for input(s): CHOL, HDL, LDLCALC, TRIG, CHOLHDL, LDLDIRECT in the last 72 hours. Thyroid Function Tests: No results for input(s): TSH, T4TOTAL, FREET4, T3FREE, THYROIDAB in the last 72 hours. Anemia Panel: Recent Labs    11/03/19 0559 11/04/19 0521  FERRITIN 28 24      Radiology Studies: I have reviewed all of the imaging during this hospital visit  personally     Scheduled Meds: . albuterol  2 puff Inhalation Q6H  . vitamin C  500 mg Oral Daily  . aspirin EC  81 mg Oral Daily  . Chlorhexidine Gluconate Cloth  6 each Topical Daily  . colchicine  0.6 mg Oral Daily  . collagenase   Topical Daily  . enoxaparin (LOVENOX) injection  0.5 mg/kg Subcutaneous Q24H  . ferrous sulfate  325 mg Oral Q breakfast  . furosemide  40 mg Oral Daily  . gabapentin  600 mg Oral BID  . insulin aspart  0-20 Units Subcutaneous TID WC  . insulin aspart  0-5 Units Subcutaneous QHS  . insulin aspart  10 Units Subcutaneous TID WC  . insulin glargine  50 Units Subcutaneous BID  . mouth rinse  15 mL Mouth Rinse BID  . midodrine  5 mg Oral BID WC  . pantoprazole  40 mg Oral BID  . predniSONE  30 mg Oral Q breakfast  . umeclidinium bromide  1 puff Inhalation Daily  . zinc sulfate  220 mg Oral Daily   Continuous Infusions:   LOS: 6 days        Deysy Schabel Gerome Apley, MD

## 2019-11-06 LAB — GLUCOSE, CAPILLARY
Glucose-Capillary: 95 mg/dL (ref 70–99)
Glucose-Capillary: 135 mg/dL — ABNORMAL HIGH (ref 70–99)
Glucose-Capillary: 240 mg/dL — ABNORMAL HIGH (ref 70–99)
Glucose-Capillary: 134 mg/dL — ABNORMAL HIGH (ref 70–99)

## 2019-11-06 MED ORDER — SODIUM ZIRCONIUM CYCLOSILICATE 10 G PO PACK
10.0000 g | PACK | Freq: Two times a day (BID) | ORAL | Status: AC
  Administered 2019-11-06 (×2): 10 g via ORAL
  Filled 2019-11-06 (×2): qty 1

## 2019-11-06 NOTE — Progress Notes (Addendum)
Occupational Therapy Treatment Patient Details Name: Cassandra Barnes MRN: [id number] DOB: 06-23-1945 Today's Date: 11/06/2019    History of present illness  Cassandra Barnes is a 74 y.o. female with medical history significant for chronic hypoxemic respiratory failure secondary to underlying COPD-on 2 L of oxygen via nasal cannula, hypertension, diabetes, CHF, morbid obesity, gout, GERD, chronic pressure ulcers on heel, bedridden, Covid pneumonia presents to emergency department with worsening shortness of breath since 1 week and confusion started yesterday and her symptoms got worse this morning therefore family called EMS and brought patient to the emergency department for further evaluation and management.   OT comments  Patient supine in bed on arrival and is happy to work with therapy.  She expressed she really likes being able to sit up.  Got to EOB with mod assist.  She required cueing for sequencing steps of sitting up and needs max assist moving legs.  Seated balance is progressing.  Patient able to sit EOB ~20 min this session with single-no UE support and min guard.  Practiced reaching and grasping cup outside of base of support to increase balance and fine motor coordination.  Also completed UE exercise at EOB.  On 2L SpO2 does decrease when she talks to 84-87.  Patient performing well and continues to express desire to get stronger.  Recommendation for SNF is still appropriate.  Will continue to follow with OT acutely to address the deficits listed below.    Follow Up Recommendations  SNF;Supervision - Intermittent    Equipment Recommendations  Other (comment) (defer to next venue)    Recommendations for Other Services      Precautions / Restrictions Precautions Precautions: Fall Precaution Comments: Weak, pressure injuries bil heels.  Required Braces or Orthoses: Other Brace       Mobility Bed Mobility Overal bed mobility: Needs Assistance Bed Mobility: Supine to Sit;Sit to  Supine     Supine to sit: Mod assist;HOB elevated Sit to supine: Mod assist;HOB elevated      Transfers                 General transfer comment: Per pt report she is lifted via hoyer OOB to [facility] for MD appointments.     Balance Overall balance assessment: Needs assistance Sitting-balance support: Single extremity supported;No upper extremity supported Sitting balance-Leahy Scale: Fair Sitting balance - Comments: ABle to sit EOB and reach for objects without support                                   ADL either performed or assessed with clinical judgement   ADL Overall ADL's : Needs assistance/impaired Eating/Feeding: Set up;Sitting                                   Functional mobility during ADLs: Moderate assistance General ADL Comments: Session focused on building activity tolerance and improving seated balance to prepare for seated ADLs     Vision       Perception     Praxis      Cognition Arousal/Alertness: Awake/alert Behavior During Therapy: WFL for tasks assessed/performed Overall Cognitive Status: Within Functional Limits for tasks assessed  General Comments: Some difficulty sequencing/problem solving steps to sit up        Exercises Exercises: General Upper Extremity General Exercises - Upper Extremity Shoulder Flexion: AROM;Strengthening;Both;10 reps;Seated Shoulder Extension: AROM;Strengthening;Both;10 reps;Seated Shoulder Horizontal ABduction: AROM;Strengthening;Both;10 reps;Seated Shoulder Horizontal ADduction: AROM;Strengthening;Both;10 reps;Seated Elbow Flexion: AROM;Strengthening;Both;10 reps;Seated Elbow Extension: AROM;Strengthening;Both;10 reps;Seated Other Exercises Other Exercises: Seated EOB ~20 min   Shoulder Instructions       General Comments BP 106/60 after patient reporting feling a little dizzy at EOB    Pertinent Vitals/ Pain       Pain  Assessment: Faces Faces Pain Scale: Hurts a little bit Pain Location: feet/ heels Pain Descriptors / Indicators: Discomfort;Grimacing;Guarding Pain Intervention(s): Monitored during session;Repositioned  Home Living                                          Prior Functioning/Environment              Frequency  Min 2X/week        Progress Toward Goals  OT Goals(current goals can now be found in the care plan section)  Progress towards OT goals: Progressing toward goals  Acute Rehab OT Goals Patient Stated Goal: get stronger at rehab OT Goal Formulation: With patient Time For Goal Achievement: 11/15/19 Potential to Achieve Goals: Good  Plan Discharge plan remains appropriate    Co-evaluation                 AM-PAC OT "6 Clicks" Daily Activity     Outcome Measure   Help from another person eating meals?: A Little Help from another person taking care of personal grooming?: A Little Help from another person toileting, which includes using toliet, bedpan, or urinal?: Total Help from another person bathing (including washing, rinsing, drying)?: A Lot Help from another person to put on and taking off regular upper body clothing?: A Little Help from another person to put on and taking off regular lower body clothing?: A Lot 6 Click Score: 14    End of Session Equipment Utilized During Treatment: Oxygen  OT Visit Diagnosis: Muscle weakness (generalized) (M62.81);Unsteadiness on feet (R26.81);Pain Pain - part of body: Ankle and joints of foot   Activity Tolerance Patient tolerated treatment well   Patient Left in bed;with call bell/phone within reach;with bed alarm set   Nurse Communication Mobility status        Time: 2876-8115 OT Time Calculation (min): 31 min  Charges: OT General Charges $OT Visit: 1 Visit OT Treatments $Self Care/Home Management : 8-22 mins $Therapeutic Exercise: 8-22 mins  August Luz,  OTR/L    Phylliss Bob 11/06/2019, 9:39 AM

## 2019-11-06 NOTE — Plan of Care (Signed)
  Problem: Clinical Measurements: Goal: Respiratory complications will improve Outcome: Progressing   Problem: Nutrition: Goal: Adequate nutrition will be maintained Outcome: Progressing   Problem: Coping: Goal: Level of anxiety will decrease Outcome: Progressing   Problem: Safety: Goal: Ability to remain free from injury will improve Outcome: Progressing

## 2019-11-06 NOTE — TOC Progression Note (Addendum)
Transition of Care [facility]) - Progression Note    Patient Details  Name: Cassandra Barnes MRN: [id number] Date of Birth: May 20, 1945  Transition of [facility] CM/SW [place], LCSW Phone Number: 11/06/2019, 9:24 AM  Clinical Narrative:    9:24am-CSW contacted [place] to inquire about patient's SNF approval. It is still pending.  1:46pm-CSW received call from [facility] that patient has been denied and is offering a peer to peer with the MD. CSW paged MD.  2:39pm-MD to speak with patient and family regarding return home.      Expected Discharge Plan: Skilled Nursing Facility Barriers to Discharge: Insurance Authorization  Expected Discharge Plan and Services Expected Discharge Plan: [place] In-house Referral: Clinical Social Work     Living arrangements for the past 2 months: Single Family Home                                       Social Determinants of Health (SDOH) Interventions    Readmission Risk Interventions No flowsheet data found.

## 2019-11-06 NOTE — Progress Notes (Addendum)
PROGRESS NOTE    Cassandra Barnes  TDD:[id number] DOB: 05/27/1945 DOA: 10/30/2019 PCP: Cher Nakai, MD    Brief Narrative:  Patient admitted to the hospital with working diagnosis of acute hypoxic respiratory failure due to right lower lobe pneumonia, complicated with sepsis, endorgan failure encephalopathy.  In the setting of recent SARS COVID-19 viral pneumonia.   Patient had Covid pneumonia May 2021, she was treated in [facility] hospital with steroids and antivirals.  She reported 1 week of confusion and dyspnea prior to this hospitalization.  She was also noted to be febrile and worsening oxygen saturations.  Patient was noted to be confused.  On her initial physical examination blood pressure was 102/50, heart rate 70, respirate 15, oxygen saturation 95% on 3 L per nasal cannula.  Her lungs are clear to auscultation, heart S1-S2, present rhythmic, soft abdomen, no extremity edema.  Chest x-ray had a dense patchy right lower lobe infiltrate.   Patient had a prolonged hospitalization, she required noninvasive mechanical ventilation for hypercapnic respiratory failure.  She has been treated with steroids and remdesivir, antibiotic therapy with ceftriaxone and azithromycin.    Assessment & Plan:   Principal Problem:   Sepsis due to COVID-19 [facility]) Active Problems:   Hypertension   AKI (acute kidney injury) ([place])   Diabetes mellitus without complication (HCC)   CHF (congestive heart failure) (HCC)   Normocytic anemia   COPD (chronic obstructive pulmonary disease) ([place])     1. Right lower lobe community acquired pneumonia in the setting of recent COVID 19 viral pneumonia, end organ failure encephalopathy. (acute on chronic hypoxic and hypercapnic respiratory failure) Continue oxygenation above 88% on 2 L/ min per [place]. Clinically dyspnea continue to improve, but patient very weak and deconditioned.   Continue with tapering off prednisone. Patient is bedbound.   2. Acute on  chronic diastolic heart failure. Continue blood pressure control. Clinically now euvolemic.    On daily furosemide.   3. Asymptomatic bacteriuria. Urine culture positive for enterococcus, holding on antibiotic therapy, ruled out urine infection  4. AKI with hyperkalemia. Continue to be stable renal function, with serum cr at 0,87,K is rising to 4,9 and serum bicarbonate stable at 40.  Will add 2 doses of sodium zirconium and will check renal panel in am.   5. HTN/ hypotension. Continue with midodrine.  6. Controlled T2DM Hgb A1c 6,4. Fating glucose this am down to 89, capillary 188, 95 and 135.  Will continue with insulin lantus 15 units bid along with insulin sliding scale, plus 10 units with premeal insulin.  Steroid dose has been reduced.   7. Bilateral heal pressure ulcer, present on admission, not able to stage. Local wound care. Patient has been bedbound.   8. Iron deficiency anemia. On iron supplementation.     Status is: Inpatient  Remains inpatient appropriate because:Inpatient level of care appropriate due to severity of illness   Dispo:  Patient From: Home  Planned Disposition: [place]  Expected discharge date: 11/05/19  Medically stable for discharge: Yes    DVT prophylaxis: Enoxaparin   Code Status:   full  Family Communication:  I spoke over the phone with the patient's daughter about patient's  condition, plan of care, prognosis and all questions were addressed.     Nutrition Status:           Skin Documentation: Pressure Injury 10/30/19 Heel Right;Left Unstageable - Full thickness tissue loss in which the base of the injury is covered by slough (yellow, tan,  gray, green or brown) and/or eschar (tan, brown or black) in the wound bed. (Active)  10/30/19   Location: Heel  Location Orientation: Right;Left  Staging: Unstageable - Full thickness tissue loss in which the base of the injury is covered by slough (yellow, tan, gray,  green or brown) and/or eschar (tan, brown or black) in the wound bed.  Wound Description (Comments):   Present on Admission: Yes     Pressure Injury 11/02/19 Heel Left Unstageable - Full thickness tissue loss in which the base of the injury is covered by slough (yellow, tan, gray, green or brown) and/or eschar (tan, brown or black) in the wound bed. (Active)  11/02/19 1606  Location: Heel  Location Orientation: Left  Staging: Unstageable - Full thickness tissue loss in which the base of the injury is covered by slough (yellow, tan, gray, green or brown) and/or eschar (tan, brown or black) in the wound bed.  Wound Description (Comments):   Present on Admission: Yes      Subjective: Patient with continue to improve dyspnea, but continue to be very weak and deconditioned, no nausea or vomiting, no chest pain. Patient has been bedbound.   Objective: Vitals:   11/05/19 0511 11/05/19 0628 11/05/19 2155 11/06/19 0520  BP: (!) 122/51  (!) 122/50 (!) 130/47  Pulse: 56  65 61  Resp: 13  17 14   Temp: 98 F (36.7 C)  98.5 F (36.9 C) 98.8 F (37.1 C)  TempSrc: Oral  Oral Oral  SpO2: 98%  90%   Weight:  (!) 125.7 kg  (!) 124.5 kg  Height:        Intake/Output Summary (Last 24 hours) at 11/06/2019 1015 Last data filed at 11/06/2019 0900 Gross per 24 hour  Intake 240 ml  Output 1950 ml  Net -1710 ml   Filed Weights   11/04/19 0610 11/05/19 0628 11/06/19 0520  Weight: (!) 125.6 kg (!) 125.7 kg (!) 124.5 kg    Examination:   General: Not in pain or dyspnea. Deconditioned  Neurology: Awake and alert, non focal  E ENT: mild pallor, no icterus, oral mucosa moist Cardiovascular: No JVD. S1-S2 present, rhythmic, no gallops, rubs, or murmurs. No lower extremity edema. Pulmonary: positive breath sounds bilaterally, decreased breath sounds at bases. Gastrointestinal. Abdomen soft and non tender Skin. No rashes Musculoskeletal: no joint deformities     Data Reviewed: I have personally  reviewed following labs and imaging studies  CBC: Recent Labs  Lab 10/31/19 0343 11/01/19 0424 11/02/19 0402 11/03/19 0559 11/04/19 0521  WBC 10.7* 8.8 6.9 4.9 6.0  NEUTROABS 8.9* 7.1 5.0 3.2 3.5  HGB 9.2* 8.2* 7.9* 7.7* 7.8*  HCT 33.0* 28.7* 28.5* 28.1* 28.5*  MCV 100.0 96.6 97.6 98.9 99.0  PLT 172 185 201 179 220   Basic Metabolic Panel: Recent Labs  Lab 10/31/19 0343 10/31/19 0343 11/01/19 0424 11/02/19 0402 11/03/19 0559 11/04/19 0521 11/05/19 0756  NA 137   < > 135 136 139 140 143  K 5.8*   < > 5.1 4.8 4.5 4.7 4.9  CL 97*   < > 93* 94* 98 97* 97*  CO2 27   < > 33* 33* 30 37* 40*  GLUCOSE 266*   < > 195* 208* 204* 163* 89  BUN 26*   < > 34* 37* 34* 32* 28*  CREATININE 1.27*   < > 0.83 0.92 0.78 0.90 0.87  CALCIUM 8.7*   < > 8.5* 8.6* 8.7* 8.8* 9.0  MG 1.9  --  2.1 2.4 2.3  --   --   PHOS 6.0*  --   --   --   --   --   --    < > = values in this interval not displayed.   GFR: Estimated Creatinine Clearance: 81.3 mL/min (by C-G formula based on SCr of 0.87 mg/dL). Liver Function Tests: Recent Labs  Lab 10/31/19 0343 11/01/19 0424 11/02/19 0402 11/03/19 0559 11/04/19 0521  AST 27 19 15 16 20   ALT 17 15 13 12 17   ALKPHOS 61 56 49 51 48  BILITOT 0.5 0.3 0.3 0.3 0.4  PROT 6.2* 6.2* 6.4* 6.0* 5.7*  ALBUMIN 2.7* 2.7* 2.8* 2.8* 2.7*   No results for input(s): LIPASE, AMYLASE in the last 168 hours. No results for input(s): AMMONIA in the last 168 hours. Coagulation Profile: Recent Labs  Lab 10/30/19 1100  INR 1.1   Cardiac Enzymes: No results for input(s): CKTOTAL, CKMB, CKMBINDEX, TROPONINI in the last 168 hours. BNP (last 3 results) No results for input(s): PROBNP in the last 8760 hours. HbA1C: No results for input(s): HGBA1C in the last 72 hours. CBG: Recent Labs  Lab 11/05/19 0733 11/05/19 1154 11/05/19 1610 11/05/19 2155 11/06/19 0731  GLUCAP 78 155* 157* 188* 95   Lipid Profile: No results for input(s): CHOL, HDL, LDLCALC, TRIG,  CHOLHDL, LDLDIRECT in the last 72 hours. Thyroid Function Tests: No results for input(s): TSH, T4TOTAL, FREET4, T3FREE, THYROIDAB in the last 72 hours. Anemia Panel: Recent Labs    11/04/19 0521  FERRITIN 24      Radiology Studies: I have reviewed all of the imaging during this hospital visit personally     Scheduled Meds: . albuterol  2 puff Inhalation Q6H  . vitamin C  500 mg Oral Daily  . aspirin EC  81 mg Oral Daily  . Chlorhexidine Gluconate Cloth  6 each Topical Daily  . colchicine  0.6 mg Oral Daily  . collagenase   Topical Daily  . enoxaparin (LOVENOX) injection  0.5 mg/kg Subcutaneous Q24H  . ferrous sulfate  325 mg Oral Q breakfast  . furosemide  40 mg Oral Daily  . gabapentin  600 mg Oral BID  . insulin aspart  0-20 Units Subcutaneous TID WC  . insulin aspart  0-5 Units Subcutaneous QHS  . insulin aspart  10 Units Subcutaneous TID WC  . insulin glargine  50 Units Subcutaneous BID  . mouth rinse  15 mL Mouth Rinse BID  . midodrine  5 mg Oral BID WC  . pantoprazole  40 mg Oral BID  . predniSONE  15 mg Oral Q breakfast  . umeclidinium bromide  1 puff Inhalation Daily  . zinc sulfate  220 mg Oral Daily   Continuous Infusions:   LOS: 7 days        Bralyn Espino Gerome Apley, MD

## 2019-11-07 DIAGNOSIS — D649 Anemia, unspecified: Secondary | ICD-10-CM

## 2019-11-07 LAB — GLUCOSE, CAPILLARY
Glucose-Capillary: 62 mg/dL — ABNORMAL LOW (ref 70–99)
Glucose-Capillary: 62 mg/dL — ABNORMAL LOW (ref 70–99)
Glucose-Capillary: 89 mg/dL (ref 70–99)
Glucose-Capillary: 89 mg/dL (ref 70–99)
Glucose-Capillary: 158 mg/dL — ABNORMAL HIGH (ref 70–99)
Glucose-Capillary: 156 mg/dL — ABNORMAL HIGH (ref 70–99)
Glucose-Capillary: 147 mg/dL — ABNORMAL HIGH (ref 70–99)

## 2019-11-07 NOTE — TOC Progression Note (Signed)
Transition of Care [facility]) - Progression Note    Patient Details  Name: Cassandra Barnes MRN: [id number] Date of Birth: [birth year]  [facility] CM/SW Contact  Carles Collet, RN Phone Number: 11/07/2019, 2:39 PM  Clinical Narrative:    Damaris Schooner w patient's daughter, Tessie Fass. Patsy states that patient lives at home her spouse who provides meals for patient and is home with patient for supervision. Patsy expressed that she has caregiver fatigue. She states that she does not get very much help from siblings, lives 20 minutes from her mother, and is at her house daily. She described how they do not get along, that the patient does not listen to her requests to be more active or self sufficient. She does however comply w [facility] services. CM provided compassionate listening, also explained why SNF could not be arranged due to insurance denial. Tessie Fass states that she will be at the house today around 7pm, and requested to have [facility] provide transport. Spoke w the patient to update her and she states that her spouse will be home at 6pm, but agreed to 7pm for [facility].     Liller, Yohn Daughter   [phone]     Expected Discharge Plan: [place] Barriers to Discharge: Barriers Resolved  Expected Discharge Plan and Services Expected Discharge Plan: [place] In-house Referral: Clinical Social Work     Living arrangements for the past 2 months: [place] Expected Discharge Date: 11/07/19                         HH Arranged: RN, PT [place] Agency: [place] Date [place]: 11/07/19 Time [place]: 51 Representative spoke with at [place]: [place] ([place]) Interventions    Readmission Risk Interventions No flowsheet data found.

## 2019-11-07 NOTE — TOC Transition Note (Addendum)
Transition of Care [facility]) - CM/SW Discharge Note   Patient Details  Name: Cassandra Barnes MRN: [id number] Date of Birth: September 25, 1945  Transition of Care [facility]) CM/SW Contact:  Benard Halsted, LCSW Phone Number: 11/07/2019, 3:37 PM   Clinical Narrative:    Patient will DC to: Home Anticipated DC date: 11/07/19 Family notified: Daughter, Human resources officer by: Corey Harold 7pm  CSW sent message to [facility] liaison to see if they can resume services as well.   Per MD patient ready for DC to Home. RN, patient, patient's family, and home health notified of DC.  DC packet on chart. Ambulance transport requested for patient.   CSW will sign off for now as social work intervention is no longer needed. Please consult [place] again if new needs arise.      Final next level of care: [place] Barriers to Discharge: Barriers Resolved   Patient Goals and CMS Choice Patient states their goals for this hospitalization and ongoing recovery are:: Rehab CMS Medicare.gov Compare Post Acute Care list provided to:: Patient Represenative (must comment) (Daughter) Choice offered to / list presented to : Patient, Adult Children  Discharge Placement                Patient to be transferred to facility by: [place] Name of family member notified: Daughter Patient and family notified of of transfer: 11/07/19  Discharge Plan and Services In-house Referral: Clinical Social Work                        HH Arranged: Therapist, sports, PT [place] Agency: [place] Date [place]: 11/07/19 Time [place]: 39 Representative spoke with at [place]: [place] ([place]) Interventions     Readmission Risk Interventions No flowsheet data found.

## 2019-11-07 NOTE — Discharge Summary (Signed)
Physician Discharge Summary  Cassandra Barnes ZOX:[id number] DOB: 01-May-1945 DOA: 10/30/2019  PCP: Cher Nakai, MD  Admit date: 10/30/2019 Discharge date: 11/07/2019  Admitted From: Home  Disposition:  Home   Recommendations for Outpatient Follow-up and new medication changes:  1. Follow up with Dr. Truman Hayward in 7 days.  2. Continue diuresis with torsemide 3. Follow up on renal function in 7 days.   Home Health: yes plus social work Equipment/Devices: na    Discharge Condition: stable  CODE STATUS: full  Diet recommendation: heart healthy and diabetic prudent.   Brief/Interim Summary: Patient admitted to the hospital with working diagnosis of acute hypoxic respiratory failure due to right lower lobe pneumonia, complicated with sepsis, endorgan failure encephalopathy.In the setting of recent SARS COVID-19 viral pneumonia.  Patient had Covid pneumonia May 2021, she was treated in [facility] hospital with steroids and antivirals. She reported 1 week of confusion and dyspnea prior to this hospitalization. She was also noted to be febrile and worsening oxygen saturations. Patient was noted to be confused.  On her initial physical examination blood pressure was 102/50, heart rate 70, respiratory rate 15, oxygen saturation 95% on 3 L per nasal cannula. Her lungs were clear to auscultation, heart S1-S2, present rhythmic, soft abdomen, no lower extremity edema.  Urine culture positive for VRE. Sodium 136, potassium 4.6, chloride 92, bicarb 37, glucose 109, BUN 16, creatinine 1.23.  White count 14.2, hemoglobin 80.1, hematocrit 29.4, platelets 182.  SARS COVID-19 was positive.  Urinalysis more than 50 white cells. Chest x-rayhad a dense patchy right lower lobe infiltrate.  Patient had a prolonged hospitalization, she required noninvasive mechanical ventilation for hypercapnic respiratory failure. She has been treated with steroids and remdesivir, antibiotic therapy with ceftriaxone and  azithromycin.  1.  Right lower lobe community-acquired pneumonia, in the setting of recent SARS COVID-19 viral pneumonia.  Endorgan failure encephalopathy, (acute on chronic hypoxic -hypercapnic respiratory failure). Patient received antibiotic therapy with good toleration, his oxygenation at discharge 98% on 2 L per nasal cannula. She also received systemic steroids, antiviral and antibiotic therapy.  Patient had further work-up with CT chest negative for pulmonary embolism.  Positive bilateral lower lobe infiltrates.  2.  Acute on chronic diastolic heart failure.  Patient was diuresed with furosemide, clinically euvolemic, at discharge she will resume torsemide.  Further work-up with echocardiography showed a preserved LV systolic function 55 to 81%.  RV function was normal.  3.  Asymptomatic bacteriuria.  Urine culture was positive for Enterococcus, she also had pyuria but no urinary symptoms.  She had close monitoring, urine infection was ruled out.  4.  Acute kidney injury with hyperkalemia.  Patient well medical therapy.  Patient has sodium zirconium, discharge potassium 4.9.  Will recommend follow-up as an outpatient. His discharge creatinine is 0.87.  5.  Hypertension.  Patient received monitoring while hospitalized.  6.  Controlled type 2 diabetes mellitus, hemoglobin A1c 6.4 patient received insulin therapy during her hospitalization, long-acting, sliding scale and premeal.  7.  Bilateral lower extremity heel pressure ulcers.  Unable to stage, present on admission, continue local wound care.  8.  Iron deficiency anemia.  Continue iron supplementation.  Discharge Diagnoses:  Principal Problem:   Sepsis due to COVID-19 [facility]) Active Problems:   Hypertension   AKI (acute kidney injury) ([place])   Diabetes mellitus without complication (HCC)   CHF (congestive heart failure) (HCC)   Normocytic anemia   COPD (chronic obstructive pulmonary disease) ([place])    Discharge  Instructions  Allergies as of 11/07/2019      Reactions   Codeine    Hydrocodone       Medication List    STOP taking these medications   clopidogrel 75 MG tablet Commonly known as: PLAVIX   meloxicam 15 MG tablet Commonly known as: MOBIC     TAKE these medications   acetaminophen 325 MG tablet Commonly known as: TYLENOL Take 650 mg by mouth every 6 (six) hours as needed for mild pain.   albuterol (2.5 MG/3ML) 0.083% nebulizer solution Commonly known as: PROVENTIL Take 2.5 mg by nebulization every 4 (four) hours as needed for wheezing or shortness of breath.   allopurinol 100 MG tablet Commonly known as: ZYLOPRIM Take 100 mg by mouth daily.   aspirin EC 81 MG tablet Take 81 mg by mouth daily.   colchicine 0.6 MG tablet Take 0.6 mg by mouth daily.   docusate sodium 100 MG capsule Commonly known as: COLACE Take 100 mg by mouth daily. Daughter reports taking OTC stool softener 100 mg QD per PCP instructions   ferrous sulfate 325 (65 FE) MG EC tablet Take 325 mg by mouth daily with breakfast.   gabapentin 600 MG tablet Commonly known as: NEURONTIN Take 600 mg by mouth 2 (two) times daily.   insulin glargine 100 UNIT/ML injection Commonly known as: LANTUS Inject 45 Units into the skin 2 (two) times daily.   insulin regular 100 units/mL injection Commonly known as: NOVOLIN R Inject 8 Units into the skin 3 (three) times daily before meals. Daughter reports taking TIC/ AC; uses sliding scale 0-8 U according to PCP instructions   metoprolol succinate 25 MG 24 hr tablet Commonly known as: TOPROL-XL Take 25 mg by mouth daily.   multivitamin with minerals Tabs tablet Take 1 tablet by mouth daily.   nystatin powder Commonly known as: MYCOSTATIN/NYSTOP Apply topically 4 (four) times daily.   ondansetron 4 MG tablet Commonly known as: ZOFRAN Take 4 mg by mouth every 8 (eight) hours as needed for nausea or vomiting.   pantoprazole 40 MG tablet Commonly known  as: PROTONIX Take 40 mg by mouth 2 (two) times daily.   promethazine 25 MG tablet Commonly known as: PHENERGAN Take 25 mg by mouth every 6 (six) hours as needed for nausea or vomiting.   torsemide 20 MG tablet Commonly known as: DEMADEX Take 20 mg by mouth daily. Daughter reports taking (2) 20 mg (for total dose of 40 mg) po QD            Discharge Care Instructions  (From admission, onward)         Start     Ordered   11/07/19 0000  Discharge wound care:       Comments: Continue local wound care, clean wound and protecting dressing daily.   11/07/19 1201          Follow-up Information    Cher Nakai, MD Follow up in 1 week(s).   Specialty: Internal Medicine Contact information: [place] [id number] [phone]              Allergies  Allergen Reactions  . Codeine   . Hydrocodone       Procedures/Studies: CT ANGIO CHEST PE W OR WO CONTRAST  Result Date: 10/31/2019 CLINICAL DATA:  Fever and COVID-19 positivity with shortness of breath EXAM: CT ANGIOGRAPHY CHEST WITH CONTRAST TECHNIQUE: Multidetector CT imaging of the chest was performed using the standard protocol during bolus administration  of intravenous contrast. Multiplanar CT image reconstructions and MIPs were obtained to evaluate the vascular anatomy. CONTRAST:  72mL OMNIPAQUE IOHEXOL 350 MG/ML SOLN COMPARISON:  10/30/2019 FINDINGS: Cardiovascular: Thoracic aorta demonstrates atherosclerotic calcifications without aneurysmal dilatation. The degree of opacification is somewhat limited. Stable cardiomegaly is noted. Pulmonary artery demonstrates a normal branching pattern. No definitive filling defect to suggest pulmonary embolism is noted. Coronary calcifications are noted. Mediastinum/Nodes: Thoracic inlet is within normal limits. Scattered hilar and mediastinal lymph nodes are noted slightly more prominent than that seen on the prior exam particularly in the subcarinal region.  These are felt to be reactive in nature. The esophagus as visualized is within normal limits. Lungs/Pleura: Emphysematous changes are noted in the lungs bilaterally. Patchy bilateral lower lobe infiltrates are noted right greater than left. The degree of aeration has improved although some worsening of the infiltrate is noted particularly in the superior aspect of the right lower lobe. Upper Abdomen: Upper abdomen demonstrates cirrhotic change of the liver. No other focal abnormality is seen. Musculoskeletal: Degenerative changes of the thoracic spine are noted. Review of the MIP images confirms the above findings. IMPRESSION: No evidence of pulmonary emboli. Bilateral lower lobe waxing and waning infiltrates worse on the right. No sizable effusions are seen. Reactive hilar and mediastinal adenopathy is noted. Changes of cirrhosis of the liver. Aortic Atherosclerosis (ICD10-I70.0). Electronically Signed   By: Inez Catalina M.D.   On: 10/31/2019 01:33   DG Chest Port 1 View  Result Date: 10/30/2019 CLINICAL DATA:  Shortness of breath, fever EXAM: PORTABLE CHEST 1 VIEW COMPARISON:  08/12/2019 FINDINGS: Bilateral perihilar and lower lung opacities. No significant pleural effusion. No pneumothorax. Cardiomegaly. IMPRESSION: Bilateral perihilar lower lung opacities may reflect multifocal pneumonia or edema. Cardiomegaly. Electronically Signed   By: Macy Mis M.D.   On: 10/30/2019 12:10   ECHOCARDIOGRAM COMPLETE  Result Date: 10/31/2019    ECHOCARDIOGRAM REPORT   Patient Name:   VELITA QUIRK Tseng Date of Exam: 10/31/2019 Medical Rec #:  [id number]       Height:       68.0 in Accession #:    [id number]      Weight:       270.0 lb Date of Birth:  14-Sep-1945        BSA:          2.322 m Patient Age:    33 years        BP:           97/64 mmHg Patient Gender: F               HR:           66 bpm. Exam Location:  Inpatient Procedure: 2D Echo Indications:    elevated troponin  History:        Patient has no prior history  of Echocardiogram examinations.                 CHF, COPD; Risk Factors:Hypertension, Diabetes and Former                 Smoker. Covid + patient.  Sonographer:    Jannett Celestine RDCS (AE) Referring Phys: [id number] Mckinley Jewel  Sonographer Comments: Patient is morbidly obese. Image acquisition challenging due to patient body habitus. immobile patient. off axis apical windows IMPRESSIONS  1. Left ventricular ejection fraction, by estimation, is 55 to 60%. The left ventricle has normal function. The left ventricle has no regional wall motion abnormalities. There is  mild left ventricular hypertrophy. Left ventricular diastolic parameters are consistent with Grade II diastolic dysfunction (pseudonormalization). There is the interventricular septum is flattened in systole and diastole, consistent with right ventricular pressure and volume overload.  2. Right ventricular systolic function is normal. The right ventricular size is mildly enlarged.  3. The mitral valve is normal in structure. Trivial mitral valve regurgitation. No evidence of mitral stenosis.  4. The aortic valve is normal in structure. Aortic valve regurgitation is not visualized. No aortic stenosis is present.  5. Aortic dilatation noted. There is mild dilatation of the ascending aorta measuring 39 mm.  6. The inferior vena cava is normal in size with greater than 50% respiratory variability, suggesting right atrial pressure of 3 mmHg. FINDINGS  Left Ventricle: Left ventricular ejection fraction, by estimation, is 55 to 60%. The left ventricle has normal function. The left ventricle has no regional wall motion abnormalities. The left ventricular internal cavity size was normal in size. There is  mild left ventricular hypertrophy. The interventricular septum is flattened in systole and diastole, consistent with right ventricular pressure and volume overload. Left ventricular diastolic parameters are consistent with Grade II diastolic dysfunction   (pseudonormalization). Right Ventricle: The right ventricular size is mildly enlarged. No increase in right ventricular wall thickness. Right ventricular systolic function is normal. Left Atrium: Left atrial size was normal in size. Right Atrium: Right atrial size was normal in size. Pericardium: There is no evidence of pericardial effusion. Mitral Valve: The mitral valve is normal in structure. Normal mobility of the mitral valve leaflets. Trivial mitral valve regurgitation. No evidence of mitral valve stenosis. Tricuspid Valve: The tricuspid valve is normal in structure. Tricuspid valve regurgitation is not demonstrated. No evidence of tricuspid stenosis. Aortic Valve: The aortic valve is normal in structure. Aortic valve regurgitation is not visualized. No aortic stenosis is present. Pulmonic Valve: The pulmonic valve was normal in structure. Pulmonic valve regurgitation is not visualized. No evidence of pulmonic stenosis. Aorta: Aortic dilatation noted. There is mild dilatation of the ascending aorta measuring 39 mm. Venous: The inferior vena cava is normal in size with greater than 50% respiratory variability, suggesting right atrial pressure of 3 mmHg. IAS/Shunts: No atrial level shunt detected by color flow Doppler.  LEFT VENTRICLE PLAX 2D LVIDd:         4.56 cm  Diastology LVIDs:         3.58 cm  LV e' lateral:   9.03 cm/s LV PW:         1.24 cm  LV E/e' lateral: 7.5 LV IVS:        1.23 cm  LV e' medial:    4.68 cm/s LVOT diam:     2.35 cm  LV E/e' medial:  14.5 LV SV:         104 LV SV Index:   45 LVOT Area:     4.34 cm  RIGHT VENTRICLE RV S prime:     9.03 cm/s TAPSE (M-mode): 2.0 cm LEFT ATRIUM           Index LA diam:      3.40 cm 1.46 cm/m LA Vol (A4C): 62.9 ml 27.09 ml/m  AORTIC VALVE LVOT Vmax:   117.00 cm/s LVOT Vmean:  84.300 cm/s LVOT VTI:    0.239 m  AORTA Ao Root diam: 3.90 cm MITRAL VALVE MV Area (PHT): 3.65 cm    SHUNTS MV Decel Time: 208 msec    Systemic VTI:  0.24 m MV E velocity:  67.70  cm/s  Systemic Diam: 2.35 cm MV A velocity: 50.10 cm/s MV E/A ratio:  1.35 Candee Furbish MD Electronically signed by Candee Furbish MD Signature Date/Time: 10/31/2019/1:53:07 PM    Final    VAS [place] LOWER EXTREMITY VENOUS (DVT)  Result Date: 10/31/2019  Lower Venous DVTStudy Indications: Swelling, and Edema.  Limitations: Poor ultrasound/tissue interface and body habitus. Comparison Study: no prior Performing Technologist: Abram Sander RVS  Examination Guidelines: A complete evaluation includes B-mode imaging, spectral Doppler, color Doppler, and power Doppler as needed of all accessible portions of each vessel. Bilateral testing is considered an integral part of a complete examination. Limited examinations for reoccurring indications may be performed as noted. The reflux portion of the exam is performed with the patient in reverse Trendelenburg.  +---------+---------------+---------+-----------+----------+--------------+ RIGHT    CompressibilityPhasicitySpontaneityPropertiesThrombus Aging +---------+---------------+---------+-----------+----------+--------------+ CFV      Full           Yes      Yes                                 +---------+---------------+---------+-----------+----------+--------------+ SFJ      Full                                                        +---------+---------------+---------+-----------+----------+--------------+ FV Prox  Full                                                        +---------+---------------+---------+-----------+----------+--------------+ FV Mid   Full                                                        +---------+---------------+---------+-----------+----------+--------------+ FV Distal                                             Not visualized +---------+---------------+---------+-----------+----------+--------------+ PFV      Full                                                         +---------+---------------+---------+-----------+----------+--------------+ POP      Full           Yes      Yes                                 +---------+---------------+---------+-----------+----------+--------------+ PTV  Not visualized +---------+---------------+---------+-----------+----------+--------------+ PERO                                                  Not visualized +---------+---------------+---------+-----------+----------+--------------+   +---------+---------------+---------+-----------+----------+--------------+ LEFT     CompressibilityPhasicitySpontaneityPropertiesThrombus Aging +---------+---------------+---------+-----------+----------+--------------+ CFV      Full           Yes      Yes                                 +---------+---------------+---------+-----------+----------+--------------+ SFJ      Full                                                        +---------+---------------+---------+-----------+----------+--------------+ FV Prox  Full                                                        +---------+---------------+---------+-----------+----------+--------------+ FV Mid                                                Not visualized +---------+---------------+---------+-----------+----------+--------------+ FV Distal                                             Not visualized +---------+---------------+---------+-----------+----------+--------------+ PFV      Full                                                        +---------+---------------+---------+-----------+----------+--------------+ POP      Full           Yes      Yes                                 +---------+---------------+---------+-----------+----------+--------------+ PTV                                                   Not visualized  +---------+---------------+---------+-----------+----------+--------------+ PERO                                                  Not visualized +---------+---------------+---------+-----------+----------+--------------+     Summary: BILATERAL: -No evidence of popliteal cyst, bilaterally. RIGHT: - There is no evidence of deep vein thrombosis in the  lower extremity. However, portions of this examination were limited- see technologist comments above.  LEFT: - There is no evidence of deep vein thrombosis in the lower extremity. However, portions of this examination were limited- see technologist comments above.  *See table(s) above for measurements and observations. Electronically signed by Deitra Mayo MD on 10/31/2019 at 2:50:09 PM.    Final        Subjective: Patient is feeling stable, continue to be deconditioned, no mayor dyspnea or chest pain.   Discharge Exam: Vitals:   11/06/19 2142 11/07/19 0454  BP: (!) 131/54 (!) 133/48  Pulse: (!) 59 64  Resp: 17 16  Temp: 98.5 F (36.9 C) 98.6 F (37 C)  SpO2: 96% 98%   Vitals:   11/06/19 1400 11/06/19 2142 11/07/19 0454 11/07/19 0621  BP:  (!) 131/54 (!) 133/48   Pulse: 65 (!) 59 64   Resp: 13 17 16    Temp:  98.5 F (36.9 C) 98.6 F (37 C)   TempSrc:  Oral Oral   SpO2: 90% 96% 98%   Weight:    118.7 kg  Height:        General: deconditioned.  Neurology: Awake and alert, non focal  E ENT" pallor, no icterus, oral mucosa moist Cardiovascular: No JVD. S1-S2 present, rhythmic, no gallops, rubs, or murmurs. No lower extremity edema. Pulmonary: positive breath sounds bilaterally, Gastrointestinal. Abdomen soft and non tender Skin. No rashes Musculoskeletal: no joint deformities   The results of significant diagnostics from this hospitalization (including imaging, microbiology, ancillary and laboratory) are listed below for reference.     Microbiology: Recent Results (from the past 240 hour(s))  Culture, blood  (Routine x 2)     Status: None   Collection Time: 10/30/19 11:00 AM   Specimen: BLOOD LEFT FOREARM  Result Value Ref Range Status   Specimen Description BLOOD LEFT FOREARM  Final   Special Requests   Final    BOTTLES DRAWN AEROBIC AND ANAEROBIC Blood Culture adequate volume   Culture   Final    NO GROWTH 5 DAYS Performed at [facility], [address] [place], [place] [address]    Report Status 11/04/2019 FINAL  Final  SARS Coronavirus 2 by RT PCR (hospital order, performed in [facility] hospital lab) Nasopharyngeal Nasopharyngeal Swab     Status: Abnormal   Collection Time: 10/30/19 11:33 AM   Specimen: Nasopharyngeal Swab  Result Value Ref Range Status   SARS Coronavirus 2 POSITIVE (A) NEGATIVE Final    Comment: RESULT CALLED TO, READ BACK BY AND VERIFIED WITH: Lowell Bouton RN 13:40 10/30/19 ([email or web address]) (NOTE) SARS-CoV-2 target nucleic acids are DETECTED  SARS-CoV-2 RNA is generally detectable in upper respiratory specimens  during the acute phase of infection.  Positive results are indicative  of the presence of the identified virus, but do not rule out bacterial infection or co-infection with other pathogens not detected by the test.  Clinical correlation with patient history and  other diagnostic information is necessary to determine patient infection status.  The expected result is negative.  Fact Sheet for Patients:   StrictlyIdeas.no   Fact Sheet for Healthcare Providers:   BankingDealers.co.za    This test is not yet approved or cleared by the [place] FDA and  has been authorized for detection and/or diagnosis of SARS-CoV-2 by FDA under an Emergency Use Authorization (EUA).  This EUA will remain in effect (meaning this te st can be used) for the duration of  the COVID-19 declaration  under Section 564(b)(1) of the Act, 21 U.S.C. section 360-bbb-3(b)(1), unless the authorization is terminated or revoked  sooner.  Performed at [facility], [place] [address] [place], [place] [address]   Culture, Urine     Status: Abnormal   Collection Time: 10/30/19  1:34 PM   Specimen: Urine, Random  Result Value Ref Range Status   Specimen Description URINE, RANDOM  Final   Special Requests   Final    NONE Performed at [facility], [place] [address] [place], [place] [address]    Culture >=100,000 COLONIES/mL ENTEROCOCCUS FAECIUM (A)  Final   Report Status 11/03/2019 FINAL  Final   Organism ID, Bacteria ENTEROCOCCUS FAECIUM (A)  Final      Susceptibility   Enterococcus faecium - MIC*    AMPICILLIN >=32 RESISTANT Resistant     NITROFURANTOIN 64 INTERMEDIATE Intermediate     VANCOMYCIN <=0.5 SENSITIVE Sensitive     * >=100,000 COLONIES/mL ENTEROCOCCUS FAECIUM  Culture, blood (Routine x 2)     Status: None   Collection Time: 10/30/19  3:36 PM   Specimen: BLOOD  Result Value Ref Range Status   Specimen Description BLOOD RIGHT ANTECUBITAL  Final   Special Requests   Final    BOTTLES DRAWN AEROBIC ONLY Blood Culture adequate volume   Culture   Final    NO GROWTH 5 DAYS Performed at [facility], [place] [address], [place], [place] [address]    Report Status 11/04/2019 FINAL  Final  MRSA PCR Screening     Status: None   Collection Time: 11/01/19  5:48 AM   Specimen: Nasal Mucosa; Nasopharyngeal  Result Value Ref Range Status   MRSA by PCR NEGATIVE NEGATIVE Final    Comment:        The GeneXpert MRSA Assay (FDA approved for NASAL specimens only), is one component of a comprehensive MRSA colonization surveillance program. It is not intended to diagnose MRSA infection nor to guide or monitor treatment for MRSA infections. Performed at [facility], [place] [address] [place], [place] [address]      Labs: BNP (last 3 results) Recent Labs    10/30/19 1530  BNP 482.7*   Basic Metabolic Panel: Recent Labs  Lab 11/01/19 0424 11/02/19 0402 11/03/19 0559 11/04/19 0521  11/05/19 0756  NA 135 136 139 140 143  K 5.1 4.8 4.5 4.7 4.9  CL 93* 94* 98 97* 97*  CO2 33* 33* 30 37* 40*  GLUCOSE 195* 208* 204* 163* 89  BUN 34* 37* 34* 32* 28*  CREATININE 0.83 0.92 0.78 0.90 0.87  CALCIUM 8.5* 8.6* 8.7* 8.8* 9.0  MG 2.1 2.4 2.3  --   --    Liver Function Tests: Recent Labs  Lab 11/01/19 0424 11/02/19 0402 11/03/19 0559 11/04/19 0521  AST 19 15 16 20   ALT 15 13 12 17   ALKPHOS 56 49 51 48  BILITOT 0.3 0.3 0.3 0.4  PROT 6.2* 6.4* 6.0* 5.7*  ALBUMIN 2.7* 2.8* 2.8* 2.7*   No results for input(s): LIPASE, AMYLASE in the last 168 hours. No results for input(s): AMMONIA in the last 168 hours. CBC: Recent Labs  Lab 11/01/19 0424 11/02/19 0402 11/03/19 0559 11/04/19 0521  WBC 8.8 6.9 4.9 6.0  NEUTROABS 7.1 5.0 3.2 3.5  HGB 8.2* 7.9* 7.7* 7.8*  HCT 28.7* 28.5* 28.1* 28.5*  MCV 96.6 97.6 98.9 99.0  PLT 185 201 179 178   Cardiac Enzymes: No results for input(s): CKTOTAL, CKMB,  CKMBINDEX, TROPONINI in the last 168 hours. BNP: Invalid input(s): POCBNP CBG: Recent Labs  Lab 11/06/19 1159 11/06/19 1615 11/06/19 2155 11/07/19 0716 11/07/19 0732  GLUCAP 135* 240* 134* 62* 89   D-Dimer No results for input(s): DDIMER in the last 72 hours. Hgb A1c No results for input(s): HGBA1C in the last 72 hours. Lipid Profile No results for input(s): CHOL, HDL, LDLCALC, TRIG, CHOLHDL, LDLDIRECT in the last 72 hours. Thyroid function studies No results for input(s): TSH, T4TOTAL, T3FREE, THYROIDAB in the last 72 hours.  Invalid input(s): FREET3 Anemia work up No results for input(s): VITAMINB12, FOLATE, FERRITIN, TIBC, IRON, RETICCTPCT in the last 72 hours. Urinalysis    Component Value Date/Time   COLORURINE AMBER (A) 10/30/2019 1330   APPEARANCEUR CLOUDY (A) 10/30/2019 1330   LABSPEC 1.012 10/30/2019 1330   PHURINE 6.0 10/30/2019 1330   GLUCOSEU NEGATIVE 10/30/2019 1330   HGBUR SMALL (A) 10/30/2019 1330   BILIRUBINUR NEGATIVE 10/30/2019 1330    KETONESUR NEGATIVE 10/30/2019 1330   PROTEINUR NEGATIVE 10/30/2019 1330   NITRITE NEGATIVE 10/30/2019 1330   LEUKOCYTESUR LARGE (A) 10/30/2019 1330   Sepsis Labs Invalid input(s): PROCALCITONIN,  WBC,  LACTICIDVEN Microbiology Recent Results (from the past 240 hour(s))  Culture, blood (Routine x 2)     Status: None   Collection Time: 10/30/19 11:00 AM   Specimen: BLOOD LEFT FOREARM  Result Value Ref Range Status   Specimen Description BLOOD LEFT FOREARM  Final   Special Requests   Final    BOTTLES DRAWN AEROBIC AND ANAEROBIC Blood Culture adequate volume   Culture   Final    NO GROWTH 5 DAYS Performed at [facility], [place] [address] [place], [place] [address]    Report Status 11/04/2019 FINAL  Final  SARS Coronavirus 2 by RT PCR (hospital order, performed in [facility] hospital lab) Nasopharyngeal Nasopharyngeal Swab     Status: Abnormal   Collection Time: 10/30/19 11:33 AM   Specimen: Nasopharyngeal Swab  Result Value Ref Range Status   SARS Coronavirus 2 POSITIVE (A) NEGATIVE Final    Comment: RESULT CALLED TO, READ BACK BY AND VERIFIED WITH: Lowell Bouton RN 13:40 10/30/19 ([email or web address]) (NOTE) SARS-CoV-2 target nucleic acids are DETECTED  SARS-CoV-2 RNA is generally detectable in upper respiratory specimens  during the acute phase of infection.  Positive results are indicative  of the presence of the identified virus, but do not rule out bacterial infection or co-infection with other pathogens not detected by the test.  Clinical correlation with patient history and  other diagnostic information is necessary to determine patient infection status.  The expected result is negative.  Fact Sheet for Patients:   StrictlyIdeas.no   Fact Sheet for Healthcare Providers:   BankingDealers.co.za    This test is not yet approved or cleared by the [place] FDA and  has been authorized for detection and/or diagnosis of SARS-CoV-2  by FDA under an Emergency Use Authorization (EUA).  This EUA will remain in effect (meaning this te st can be used) for the duration of  the COVID-19 declaration under Section 564(b)(1) of the Act, 21 U.S.C. section 360-bbb-3(b)(1), unless the authorization is terminated or revoked sooner.  Performed at [facility], [place] [address] [place], [place] [address]   Culture, Urine     Status: Abnormal   Collection Time: 10/30/19  1:34 PM   Specimen: Urine, Random  Result Value Ref Range Status   Specimen Description URINE, RANDOM  Final   Special  Requests   Final    NONE Performed at [facility], [place] [address] [place], [place] [address]    Culture >=100,000 COLONIES/mL ENTEROCOCCUS FAECIUM (A)  Final   Report Status 11/03/2019 FINAL  Final   Organism ID, Bacteria ENTEROCOCCUS FAECIUM (A)  Final      Susceptibility   Enterococcus faecium - MIC*    AMPICILLIN >=32 RESISTANT Resistant     NITROFURANTOIN 64 INTERMEDIATE Intermediate     VANCOMYCIN <=0.5 SENSITIVE Sensitive     * >=100,000 COLONIES/mL ENTEROCOCCUS FAECIUM  Culture, blood (Routine x 2)     Status: None   Collection Time: 10/30/19  3:36 PM   Specimen: BLOOD  Result Value Ref Range Status   Specimen Description BLOOD RIGHT ANTECUBITAL  Final   Special Requests   Final    BOTTLES DRAWN AEROBIC ONLY Blood Culture adequate volume   Culture   Final    NO GROWTH 5 DAYS Performed at [facility], [place] [address] [place], [place] [address]    Report Status 11/04/2019 FINAL  Final  MRSA PCR Screening     Status: None   Collection Time: 11/01/19  5:48 AM   Specimen: Nasal Mucosa; Nasopharyngeal  Result Value Ref Range Status   MRSA by PCR NEGATIVE NEGATIVE Final    Comment:        The GeneXpert MRSA Assay (FDA approved for NASAL specimens only), is one component of a comprehensive MRSA colonization surveillance program. It is not intended to diagnose MRSA infection nor to guide or monitor treatment  for MRSA infections. Performed at [facility], [place] [address], [place], [place] [address]      Time coordinating discharge: 45 minutes  SIGNED:   Tawni Millers, MD  [facility] 11/07/2019, 9:52 AM

## 2019-11-07 NOTE — Progress Notes (Signed)
Inpatient Diabetes Program Recommendations  AACE/ADA: New Consensus Statement on Inpatient Glycemic Control (2015)  Target Ranges:  Prepandial:   less than 140 mg/dL      Peak postprandial:   less than 180 mg/dL (1-2 hours)      Critically ill patients:  140 - 180 mg/dL   Lab Results  Component Value Date   GLUCAP 89 11/07/2019   GLUCAP 89 11/07/2019   HGBA1C 6.4 (H) 11/01/2019    Review of Glycemic Control  Hypoglycemia this am of 62 mg/dL. Steroids being tapered.  Inpatient Diabetes Program Recommendations:     Decrease Lantus to 45 units bid  Will continue to follow.   Thank you. Lorenda Peck, RD, LDN, CDE Inpatient Diabetes Coordinator [phone]

## 2019-11-07 NOTE — TOC Progression Note (Signed)
Transition of Care [facility]) - Progression Note    Patient Details  Name: Cassandra Barnes MRN: [id number] Date of Birth: 01/15/1946  Transition of Care [facility]) CM/SW [place], LCSW Phone Number: 11/07/2019, 10:19 AM  Clinical Narrative:    CSW spoke with patient's daughter regarding discharge home. She reported being very exhausted and disappointed that patient is not able to go to SNF for rehab. CSW inquired if she had ever applied for patient for Medicaid, which would pay for long term care, and she reported that patient was not eligible. She stated her siblings do not assist her with the patient. CSW provided supportive listening and encouraged patient's daughter to care for herself and ask her siblings for help. She stated she does not have a car and does not have gas money, so she got a job this week and now will have to give it up to help the patient. CSW inquired about home health services. Daughter reported that the patient was receiving services from [facility]. Patient has all needed DME (hospital bed, lift, wheelchair, walker, oxygen). CSW confirmed patient's address ([place], [place]). Patient's daughter reported she does not have transportation to her mom's house today but that patient's spouse will be home from work tonight to receive her. CSW will arrange for a late PTAR transport. CSW spoke with [facility] and confirmed they are active with the patient; she can receive Home Health RN/PT services. No Face to Face required. CSW will fax documents once available.    Expected Discharge Plan: [place] Barriers to Discharge: Barriers Resolved  Expected Discharge Plan and Services Expected Discharge Plan: [place] In-house Referral: Clinical Social Work     Living arrangements for the past 2 months: [place]: RN, PT [place] Agency: [place] Date [place]: 11/07/19 Time [place]: 74 Representative spoke with at [place]: [place] ([place]) Interventions    Readmission Risk Interventions No flowsheet data found.

## 2019-11-07 NOTE — Care Management Important Message (Signed)
Important Message  Patient Details  Name: Cassandra Barnes MRN: [id number] Date of Birth: April 11, 1945   Medicare Important Message Given:  Yes - Important Message mailed due to current National Emergency  Verbal consent obtained due to current National Emergency  Relationship to patient: Self Contact Name: Roneka Gilpin Call Date: 11/07/19  Time: [place] Phone: [phone] Outcome: Spoke with contact Important Message mailed to: Other (must enter comment) (NCM to deliver to patient room/ nurse)    Delorse Lek 11/07/2019, 1:47 PM

## 2019-11-08 ENCOUNTER — Other Ambulatory Visit: Payer: Self-pay

## 2019-11-08 DIAGNOSIS — R6520 Severe sepsis without septic shock: Secondary | ICD-10-CM | POA: Diagnosis not present

## 2019-11-08 DIAGNOSIS — E1151 Type 2 diabetes mellitus with diabetic peripheral angiopathy without gangrene: Secondary | ICD-10-CM | POA: Diagnosis not present

## 2019-11-08 DIAGNOSIS — I890 Lymphedema, not elsewhere classified: Secondary | ICD-10-CM | POA: Diagnosis not present

## 2019-11-08 DIAGNOSIS — A4189 Other specified sepsis: Secondary | ICD-10-CM | POA: Diagnosis not present

## 2019-11-08 DIAGNOSIS — L89322 Pressure ulcer of left buttock, stage 2: Secondary | ICD-10-CM | POA: Diagnosis not present

## 2019-11-08 DIAGNOSIS — I10 Essential (primary) hypertension: Secondary | ICD-10-CM | POA: Diagnosis not present

## 2019-11-08 DIAGNOSIS — D509 Iron deficiency anemia, unspecified: Secondary | ICD-10-CM | POA: Diagnosis not present

## 2019-11-08 DIAGNOSIS — I872 Venous insufficiency (chronic) (peripheral): Secondary | ICD-10-CM | POA: Diagnosis not present

## 2019-11-08 DIAGNOSIS — I110 Hypertensive heart disease with heart failure: Secondary | ICD-10-CM | POA: Diagnosis not present

## 2019-11-08 DIAGNOSIS — L89623 Pressure ulcer of left heel, stage 3: Secondary | ICD-10-CM | POA: Diagnosis not present

## 2019-11-08 DIAGNOSIS — Z9981 Dependence on supplemental oxygen: Secondary | ICD-10-CM | POA: Diagnosis not present

## 2019-11-08 DIAGNOSIS — I5033 Acute on chronic diastolic (congestive) heart failure: Secondary | ICD-10-CM | POA: Diagnosis not present

## 2019-11-08 DIAGNOSIS — J9621 Acute and chronic respiratory failure with hypoxia: Secondary | ICD-10-CM | POA: Diagnosis not present

## 2019-11-08 DIAGNOSIS — Z794 Long term (current) use of insulin: Secondary | ICD-10-CM | POA: Diagnosis not present

## 2019-11-08 DIAGNOSIS — G9341 Metabolic encephalopathy: Secondary | ICD-10-CM | POA: Diagnosis not present

## 2019-11-08 DIAGNOSIS — L89612 Pressure ulcer of right heel, stage 2: Secondary | ICD-10-CM | POA: Diagnosis not present

## 2019-11-13 DIAGNOSIS — A4189 Other specified sepsis: Secondary | ICD-10-CM | POA: Diagnosis not present

## 2019-11-13 DIAGNOSIS — G9341 Metabolic encephalopathy: Secondary | ICD-10-CM | POA: Diagnosis not present

## 2019-11-13 DIAGNOSIS — Z9981 Dependence on supplemental oxygen: Secondary | ICD-10-CM | POA: Diagnosis not present

## 2019-11-13 DIAGNOSIS — I110 Hypertensive heart disease with heart failure: Secondary | ICD-10-CM | POA: Diagnosis not present

## 2019-11-13 DIAGNOSIS — I10 Essential (primary) hypertension: Secondary | ICD-10-CM | POA: Diagnosis not present

## 2019-11-13 DIAGNOSIS — Z794 Long term (current) use of insulin: Secondary | ICD-10-CM | POA: Diagnosis not present

## 2019-11-13 DIAGNOSIS — L89322 Pressure ulcer of left buttock, stage 2: Secondary | ICD-10-CM | POA: Diagnosis not present

## 2019-11-13 DIAGNOSIS — R6520 Severe sepsis without septic shock: Secondary | ICD-10-CM | POA: Diagnosis not present

## 2019-11-13 DIAGNOSIS — D509 Iron deficiency anemia, unspecified: Secondary | ICD-10-CM | POA: Diagnosis not present

## 2019-11-13 DIAGNOSIS — I872 Venous insufficiency (chronic) (peripheral): Secondary | ICD-10-CM | POA: Diagnosis not present

## 2019-11-13 DIAGNOSIS — I5033 Acute on chronic diastolic (congestive) heart failure: Secondary | ICD-10-CM | POA: Diagnosis not present

## 2019-11-13 DIAGNOSIS — L89623 Pressure ulcer of left heel, stage 3: Secondary | ICD-10-CM | POA: Diagnosis not present

## 2019-11-13 DIAGNOSIS — L89612 Pressure ulcer of right heel, stage 2: Secondary | ICD-10-CM | POA: Diagnosis not present

## 2019-11-13 DIAGNOSIS — I890 Lymphedema, not elsewhere classified: Secondary | ICD-10-CM | POA: Diagnosis not present

## 2019-11-13 DIAGNOSIS — J9621 Acute and chronic respiratory failure with hypoxia: Secondary | ICD-10-CM | POA: Diagnosis not present

## 2019-11-13 DIAGNOSIS — E1151 Type 2 diabetes mellitus with diabetic peripheral angiopathy without gangrene: Secondary | ICD-10-CM | POA: Diagnosis not present

## 2019-11-14 ENCOUNTER — Other Ambulatory Visit: Payer: Self-pay

## 2019-11-14 DIAGNOSIS — J989 Respiratory disorder, unspecified: Secondary | ICD-10-CM | POA: Diagnosis not present

## 2019-11-14 DIAGNOSIS — A4189 Other specified sepsis: Secondary | ICD-10-CM | POA: Diagnosis not present

## 2019-11-14 DIAGNOSIS — D509 Iron deficiency anemia, unspecified: Secondary | ICD-10-CM | POA: Diagnosis not present

## 2019-11-14 DIAGNOSIS — E1151 Type 2 diabetes mellitus with diabetic peripheral angiopathy without gangrene: Secondary | ICD-10-CM | POA: Diagnosis not present

## 2019-11-14 DIAGNOSIS — L89612 Pressure ulcer of right heel, stage 2: Secondary | ICD-10-CM | POA: Diagnosis not present

## 2019-11-14 DIAGNOSIS — Z743 Need for continuous supervision: Secondary | ICD-10-CM | POA: Diagnosis not present

## 2019-11-14 DIAGNOSIS — J90 Pleural effusion, not elsewhere classified: Secondary | ICD-10-CM | POA: Diagnosis not present

## 2019-11-14 DIAGNOSIS — I4510 Unspecified right bundle-branch block: Secondary | ICD-10-CM | POA: Diagnosis not present

## 2019-11-14 DIAGNOSIS — I872 Venous insufficiency (chronic) (peripheral): Secondary | ICD-10-CM | POA: Diagnosis not present

## 2019-11-14 DIAGNOSIS — Z9981 Dependence on supplemental oxygen: Secondary | ICD-10-CM | POA: Diagnosis not present

## 2019-11-14 DIAGNOSIS — U071 COVID-19: Secondary | ICD-10-CM | POA: Diagnosis not present

## 2019-11-14 DIAGNOSIS — I10 Essential (primary) hypertension: Secondary | ICD-10-CM | POA: Diagnosis not present

## 2019-11-14 DIAGNOSIS — I5033 Acute on chronic diastolic (congestive) heart failure: Secondary | ICD-10-CM | POA: Diagnosis not present

## 2019-11-14 DIAGNOSIS — R6520 Severe sepsis without septic shock: Secondary | ICD-10-CM | POA: Diagnosis not present

## 2019-11-14 DIAGNOSIS — M2550 Pain in unspecified joint: Secondary | ICD-10-CM | POA: Diagnosis not present

## 2019-11-14 DIAGNOSIS — R0602 Shortness of breath: Secondary | ICD-10-CM | POA: Diagnosis not present

## 2019-11-14 DIAGNOSIS — Z7401 Bed confinement status: Secondary | ICD-10-CM | POA: Diagnosis not present

## 2019-11-14 DIAGNOSIS — Z794 Long term (current) use of insulin: Secondary | ICD-10-CM | POA: Diagnosis not present

## 2019-11-14 DIAGNOSIS — R29898 Other symptoms and signs involving the musculoskeletal system: Secondary | ICD-10-CM | POA: Diagnosis not present

## 2019-11-14 DIAGNOSIS — I110 Hypertensive heart disease with heart failure: Secondary | ICD-10-CM | POA: Diagnosis not present

## 2019-11-14 DIAGNOSIS — J9621 Acute and chronic respiratory failure with hypoxia: Secondary | ICD-10-CM | POA: Diagnosis not present

## 2019-11-14 DIAGNOSIS — L89322 Pressure ulcer of left buttock, stage 2: Secondary | ICD-10-CM | POA: Diagnosis not present

## 2019-11-14 DIAGNOSIS — L89623 Pressure ulcer of left heel, stage 3: Secondary | ICD-10-CM | POA: Diagnosis not present

## 2019-11-14 DIAGNOSIS — R6889 Other general symptoms and signs: Secondary | ICD-10-CM | POA: Diagnosis not present

## 2019-11-14 DIAGNOSIS — R069 Unspecified abnormalities of breathing: Secondary | ICD-10-CM | POA: Diagnosis not present

## 2019-11-14 DIAGNOSIS — G9341 Metabolic encephalopathy: Secondary | ICD-10-CM | POA: Diagnosis not present

## 2019-11-14 DIAGNOSIS — I890 Lymphedema, not elsewhere classified: Secondary | ICD-10-CM | POA: Diagnosis not present

## 2019-11-14 DIAGNOSIS — J441 Chronic obstructive pulmonary disease with (acute) exacerbation: Secondary | ICD-10-CM | POA: Diagnosis not present

## 2019-11-14 DIAGNOSIS — Z87891 Personal history of nicotine dependence: Secondary | ICD-10-CM | POA: Diagnosis not present

## 2019-11-14 NOTE — Patient Outreach (Signed)
[place] [facility]) Care Management  11/14/2019  Cassandra Barnes [birth year] [id number]   New referral: Discharged from [facility] on 11/07/2019 with sepsis. MD office does transition of care.  Placed call to patient. A female answered the phone and reports patient is admitted to [facility].  PLAN: will reviewed [facility] EMR. Will monitor patient for discharge plan.  Tomasa Rand, RN, BSN, CEN [facility] [employer] [phone]

## 2019-11-15 DIAGNOSIS — R6520 Severe sepsis without septic shock: Secondary | ICD-10-CM | POA: Diagnosis not present

## 2019-11-15 DIAGNOSIS — G9341 Metabolic encephalopathy: Secondary | ICD-10-CM | POA: Diagnosis not present

## 2019-11-15 DIAGNOSIS — Z9981 Dependence on supplemental oxygen: Secondary | ICD-10-CM | POA: Diagnosis not present

## 2019-11-15 DIAGNOSIS — A4189 Other specified sepsis: Secondary | ICD-10-CM | POA: Diagnosis not present

## 2019-11-15 DIAGNOSIS — Z794 Long term (current) use of insulin: Secondary | ICD-10-CM | POA: Diagnosis not present

## 2019-11-15 DIAGNOSIS — D509 Iron deficiency anemia, unspecified: Secondary | ICD-10-CM | POA: Diagnosis not present

## 2019-11-15 DIAGNOSIS — I5033 Acute on chronic diastolic (congestive) heart failure: Secondary | ICD-10-CM | POA: Diagnosis not present

## 2019-11-15 DIAGNOSIS — L89623 Pressure ulcer of left heel, stage 3: Secondary | ICD-10-CM | POA: Diagnosis not present

## 2019-11-15 DIAGNOSIS — E1151 Type 2 diabetes mellitus with diabetic peripheral angiopathy without gangrene: Secondary | ICD-10-CM | POA: Diagnosis not present

## 2019-11-15 DIAGNOSIS — I110 Hypertensive heart disease with heart failure: Secondary | ICD-10-CM | POA: Diagnosis not present

## 2019-11-15 DIAGNOSIS — I890 Lymphedema, not elsewhere classified: Secondary | ICD-10-CM | POA: Diagnosis not present

## 2019-11-15 DIAGNOSIS — J9621 Acute and chronic respiratory failure with hypoxia: Secondary | ICD-10-CM | POA: Diagnosis not present

## 2019-11-15 DIAGNOSIS — L89612 Pressure ulcer of right heel, stage 2: Secondary | ICD-10-CM | POA: Diagnosis not present

## 2019-11-15 DIAGNOSIS — I872 Venous insufficiency (chronic) (peripheral): Secondary | ICD-10-CM | POA: Diagnosis not present

## 2019-11-15 DIAGNOSIS — I10 Essential (primary) hypertension: Secondary | ICD-10-CM | POA: Diagnosis not present

## 2019-11-15 DIAGNOSIS — L89322 Pressure ulcer of left buttock, stage 2: Secondary | ICD-10-CM | POA: Diagnosis not present

## 2019-11-16 DIAGNOSIS — D509 Iron deficiency anemia, unspecified: Secondary | ICD-10-CM | POA: Diagnosis not present

## 2019-11-16 DIAGNOSIS — L89322 Pressure ulcer of left buttock, stage 2: Secondary | ICD-10-CM | POA: Diagnosis not present

## 2019-11-16 DIAGNOSIS — J9621 Acute and chronic respiratory failure with hypoxia: Secondary | ICD-10-CM | POA: Diagnosis not present

## 2019-11-16 DIAGNOSIS — R6520 Severe sepsis without septic shock: Secondary | ICD-10-CM | POA: Diagnosis not present

## 2019-11-16 DIAGNOSIS — I10 Essential (primary) hypertension: Secondary | ICD-10-CM | POA: Diagnosis not present

## 2019-11-16 DIAGNOSIS — I890 Lymphedema, not elsewhere classified: Secondary | ICD-10-CM | POA: Diagnosis not present

## 2019-11-16 DIAGNOSIS — Z794 Long term (current) use of insulin: Secondary | ICD-10-CM | POA: Diagnosis not present

## 2019-11-16 DIAGNOSIS — I110 Hypertensive heart disease with heart failure: Secondary | ICD-10-CM | POA: Diagnosis not present

## 2019-11-16 DIAGNOSIS — I5033 Acute on chronic diastolic (congestive) heart failure: Secondary | ICD-10-CM | POA: Diagnosis not present

## 2019-11-16 DIAGNOSIS — Z9981 Dependence on supplemental oxygen: Secondary | ICD-10-CM | POA: Diagnosis not present

## 2019-11-16 DIAGNOSIS — E1151 Type 2 diabetes mellitus with diabetic peripheral angiopathy without gangrene: Secondary | ICD-10-CM | POA: Diagnosis not present

## 2019-11-16 DIAGNOSIS — I872 Venous insufficiency (chronic) (peripheral): Secondary | ICD-10-CM | POA: Diagnosis not present

## 2019-11-16 DIAGNOSIS — L89623 Pressure ulcer of left heel, stage 3: Secondary | ICD-10-CM | POA: Diagnosis not present

## 2019-11-16 DIAGNOSIS — L89612 Pressure ulcer of right heel, stage 2: Secondary | ICD-10-CM | POA: Diagnosis not present

## 2019-11-16 DIAGNOSIS — G9341 Metabolic encephalopathy: Secondary | ICD-10-CM | POA: Diagnosis not present

## 2019-11-16 DIAGNOSIS — A4189 Other specified sepsis: Secondary | ICD-10-CM | POA: Diagnosis not present

## 2019-11-17 ENCOUNTER — Other Ambulatory Visit: Payer: Self-pay

## 2019-11-17 DIAGNOSIS — U071 COVID-19: Secondary | ICD-10-CM | POA: Diagnosis not present

## 2019-11-17 DIAGNOSIS — J189 Pneumonia, unspecified organism: Secondary | ICD-10-CM | POA: Diagnosis not present

## 2019-11-17 DIAGNOSIS — A4189 Other specified sepsis: Secondary | ICD-10-CM | POA: Diagnosis not present

## 2019-11-17 DIAGNOSIS — J449 Chronic obstructive pulmonary disease, unspecified: Secondary | ICD-10-CM | POA: Diagnosis not present

## 2019-11-17 DIAGNOSIS — E785 Hyperlipidemia, unspecified: Secondary | ICD-10-CM | POA: Diagnosis not present

## 2019-11-17 NOTE — Patient Outreach (Signed)
[place] [facility]) Care Management  11/17/2019  Cassandra Barnes March 19, 1946 [id number]   Telephone assessment:  Placed call to patient who answered and reports she is doing ok.  Reports she has home health therapy coming to see her today. Reports she has a burning sensation to her bottom States that she has spoken with MD office today and will have a telephone visit with MD today yo review medications.    Patient has agreed to [facility] services and I will call her back in 2 business day to  Discuss needs after she talks to her doctor today.   PLAN: will call patient back in 2 business days and complete assessments and goals.  Will mail new patient packet and letter.  Tomasa Rand, RN, BSN, CEN [facility] [employer] [phone]

## 2019-11-19 DIAGNOSIS — L89610 Pressure ulcer of right heel, unstageable: Secondary | ICD-10-CM | POA: Diagnosis not present

## 2019-11-19 DIAGNOSIS — J449 Chronic obstructive pulmonary disease, unspecified: Secondary | ICD-10-CM | POA: Diagnosis not present

## 2019-11-19 DIAGNOSIS — I110 Hypertensive heart disease with heart failure: Secondary | ICD-10-CM | POA: Diagnosis not present

## 2019-11-19 DIAGNOSIS — I5032 Chronic diastolic (congestive) heart failure: Secondary | ICD-10-CM | POA: Diagnosis not present

## 2019-11-20 DIAGNOSIS — L89612 Pressure ulcer of right heel, stage 2: Secondary | ICD-10-CM | POA: Diagnosis not present

## 2019-11-20 DIAGNOSIS — D509 Iron deficiency anemia, unspecified: Secondary | ICD-10-CM | POA: Diagnosis not present

## 2019-11-20 DIAGNOSIS — L89322 Pressure ulcer of left buttock, stage 2: Secondary | ICD-10-CM | POA: Diagnosis not present

## 2019-11-20 DIAGNOSIS — R6520 Severe sepsis without septic shock: Secondary | ICD-10-CM | POA: Diagnosis not present

## 2019-11-20 DIAGNOSIS — I872 Venous insufficiency (chronic) (peripheral): Secondary | ICD-10-CM | POA: Diagnosis not present

## 2019-11-20 DIAGNOSIS — A4189 Other specified sepsis: Secondary | ICD-10-CM | POA: Diagnosis not present

## 2019-11-20 DIAGNOSIS — E1151 Type 2 diabetes mellitus with diabetic peripheral angiopathy without gangrene: Secondary | ICD-10-CM | POA: Diagnosis not present

## 2019-11-20 DIAGNOSIS — Z9981 Dependence on supplemental oxygen: Secondary | ICD-10-CM | POA: Diagnosis not present

## 2019-11-20 DIAGNOSIS — L89623 Pressure ulcer of left heel, stage 3: Secondary | ICD-10-CM | POA: Diagnosis not present

## 2019-11-20 DIAGNOSIS — I110 Hypertensive heart disease with heart failure: Secondary | ICD-10-CM | POA: Diagnosis not present

## 2019-11-20 DIAGNOSIS — I890 Lymphedema, not elsewhere classified: Secondary | ICD-10-CM | POA: Diagnosis not present

## 2019-11-20 DIAGNOSIS — G9341 Metabolic encephalopathy: Secondary | ICD-10-CM | POA: Diagnosis not present

## 2019-11-20 DIAGNOSIS — I10 Essential (primary) hypertension: Secondary | ICD-10-CM | POA: Diagnosis not present

## 2019-11-20 DIAGNOSIS — I5033 Acute on chronic diastolic (congestive) heart failure: Secondary | ICD-10-CM | POA: Diagnosis not present

## 2019-11-20 DIAGNOSIS — J9621 Acute and chronic respiratory failure with hypoxia: Secondary | ICD-10-CM | POA: Diagnosis not present

## 2019-11-20 DIAGNOSIS — Z794 Long term (current) use of insulin: Secondary | ICD-10-CM | POA: Diagnosis not present

## 2019-11-21 ENCOUNTER — Other Ambulatory Visit: Payer: Self-pay

## 2019-11-21 NOTE — Addendum Note (Signed)
Addended by: Thana Ates on: 11/21/2019 04:16 PM   Modules accepted: Orders

## 2019-11-21 NOTE — Patient Outreach (Signed)
[facility]) Care Management  [place]  11/21/2019   LARISA LANIUS [birth year] [id number]  Subjective:  Patient reports she needs help at home. Reports her daughter works 12 hour shifts and is trying to take care of her as well. Patient reports to me that she has a bath aid that comes on Wednesdays to give her a bath. Patient reports that she continues to be bed bounds. States she wears a diaper and a t shirt most of the times. Patient reports that she has a lift at home that her daughter uses to get her into a wheelchair.  Patient reports that she is on oxygen at 2 liters. States she can not walk due to ulcers on her feet. Patient reports that her CBG was 81 and 215 for the last 2 days. Reports her daughter puts medication in a pill planner.  Reports she needs help getting her medication out of the pill planner.  Reports she lives with her husband but he is not able to take care of her.  Reports her daughter take turns but that her daughter is very tired.  Patient reports to me that her breathing is good.  Denies attending follow up appointment s due to her daughters work schedule.  Objective: awake and alert talking on the phone with me. Today's Vitals   11/21/19 1313  Weight: 250 lb (113.4 kg)  Height: 1.727 m (5\' 8" )  PainSc: 5     Encounter Medications:  Outpatient Encounter Medications as of 11/21/2019  Medication Sig  . acetaminophen (TYLENOL) 325 MG tablet Take 650 mg by mouth every 6 (six) hours as needed for mild pain.   Marland Kitchen albuterol (PROVENTIL) (2.5 MG/3ML) 0.083% nebulizer solution Take 2.5 mg by nebulization every 4 (four) hours as needed for wheezing or shortness of breath.   . allopurinol (ZYLOPRIM) 100 MG tablet Take 100 mg by mouth daily.  Marland Kitchen aspirin EC 81 MG tablet Take 81 mg by mouth daily.  . colchicine 0.6 MG tablet Take 0.6 mg by mouth daily.   Marland Kitchen docusate sodium (COLACE) 100 MG capsule Take 100 mg by mouth daily. Daughter reports taking OTC  stool softener 100 mg QD per PCP instructions  . ferrous sulfate 325 (65 FE) MG EC tablet Take 325 mg by mouth daily with breakfast.  . gabapentin (NEURONTIN) 600 MG tablet Take 600 mg by mouth 2 (two) times daily.   . insulin glargine (LANTUS) 100 UNIT/ML injection Inject 45 Units into the skin 2 (two) times daily.   . insulin regular (NOVOLIN R) 100 units/mL injection Inject 8 Units into the skin 3 (three) times daily before meals. Daughter reports taking TIC/ AC; uses sliding scale 0-8 U according to PCP instructions  . metoprolol succinate (TOPROL-XL) 25 MG 24 hr tablet Take 25 mg by mouth daily.  . Multiple Vitamin (MULTIVITAMIN WITH MINERALS) TABS tablet Take 1 tablet by mouth daily.  Marland Kitchen nystatin (MYCOSTATIN/NYSTOP) powder Apply topically 4 (four) times daily.  . ondansetron (ZOFRAN) 4 MG tablet Take 4 mg by mouth every 8 (eight) hours as needed for nausea or vomiting.  . pantoprazole (PROTONIX) 40 MG tablet Take 40 mg by mouth 2 (two) times daily.   . promethazine (PHENERGAN) 25 MG tablet Take 25 mg by mouth every 6 (six) hours as needed for nausea or vomiting.  . torsemide (DEMADEX) 20 MG tablet Take 20 mg by mouth daily. Daughter reports taking (2) 20 mg (for total dose of 40 mg) po QD  No facility-administered encounter medications on file as of 11/21/2019.    Functional Status: Functional Status Survey: Is the patient deaf or have difficulty hearing?: No Does the patient have difficulty seeing, even when wearing glasses/contacts?: Yes Does the patient have difficulty concentrating, remembering, or making decisions?: Yes Does the patient have difficulty dressing or bathing?: Yes Does the patient have difficulty doing errands alone such as visiting a doctor's office or shopping?: Yes   Fall/Depression Screening: Fall Risk  11/21/2019 03/29/2019 03/09/2019  Falls in the past year? 0 (No Data) (No Data)  Comment - Falls screening previously completed- denies new/ recent falls Falls  screening previously completed- patient denies new/ recent falls  Number falls in past yr: - - -  Injury with Fall? - - -  Risk for fall due to : - Impaired mobility -  Risk for fall due to: Comment - - -  Follow up - Falls prevention discussed Falls prevention discussed   PHQ 2/9 Scores 11/21/2019 11/21/2019 02/07/2019  PHQ - 2 Score 0 0 1    Assessment: (1) non ambulatory- has bed, wheelchair and lift. (2) wounds to feet and bottom (3) request help in the home (4) no follow up PCP visit due to transportation concerns. (5) MD office does transition of care recent admission for COPD and sepsis (6) active with home health for nursing and PT. (7) daughter manages her medications.    Plan:  (1)reviewed fall precautions using a lift. Reviewed importance of breaks on wheelchair and bed.  (2)encouraged patient to follow directions and recommendations from home health nurse.  (3)will lace [facility] social worker referral.  (4) will ask social worker to assist. (5) reviewed notes from MD office (6)Enouraged patient to call home health for after hours concerns.  (7) Encouraged patient to take all medications as prescribed.  This note will be sent to MD office. Next follow up via phon in 10 days.   THN CM Care Plan Problem One     Most Recent Value  Care Plan Problem One Recent admission for COPD and sepsis  Role Documenting the Problem One Care Management [place] for Problem One Active  THN Long Term Goal  Patient will report no reamissions to the hospital in the next 60 days.   THN Long Term Goal Start Date 11/21/19  Interventions for Problem One Long Term Goal Reviewed discharge plan. reviewed impotance of taking all medications as prescribed.   THN CM Short Term Goal #1  Patient will verbalize increased energy levels in the next 30days.   THN CM Short Term Goal #1 Start Date 11/21/19  Interventions for Short Term Goal #1 Encouraged patient to contnue to follow her home  exercise plan outlind by home health.   THN CM Short Term Goal #2  Patient will verabize haealing of wounds n the next 30 days.   THN CM Short Term Goal #2 Start Date 11/21/19  Interventions for Short Term Goal #2 Reviewed with patient about her wounds. Reports home health nurse does dressng changes to both feet. Encouraged patient to inform nurse of increased pain or additional symtoms.       Tomasa Rand, RN, BSN, CEN [facility] [employer] [phone]

## 2019-11-22 ENCOUNTER — Other Ambulatory Visit: Payer: Self-pay | Admitting: *Deleted

## 2019-11-22 DIAGNOSIS — I5033 Acute on chronic diastolic (congestive) heart failure: Secondary | ICD-10-CM | POA: Diagnosis not present

## 2019-11-22 DIAGNOSIS — D509 Iron deficiency anemia, unspecified: Secondary | ICD-10-CM | POA: Diagnosis not present

## 2019-11-22 DIAGNOSIS — Z794 Long term (current) use of insulin: Secondary | ICD-10-CM | POA: Diagnosis not present

## 2019-11-22 DIAGNOSIS — L89623 Pressure ulcer of left heel, stage 3: Secondary | ICD-10-CM | POA: Diagnosis not present

## 2019-11-22 DIAGNOSIS — L89612 Pressure ulcer of right heel, stage 2: Secondary | ICD-10-CM | POA: Diagnosis not present

## 2019-11-22 DIAGNOSIS — L89322 Pressure ulcer of left buttock, stage 2: Secondary | ICD-10-CM | POA: Diagnosis not present

## 2019-11-22 DIAGNOSIS — E1151 Type 2 diabetes mellitus with diabetic peripheral angiopathy without gangrene: Secondary | ICD-10-CM | POA: Diagnosis not present

## 2019-11-22 DIAGNOSIS — I10 Essential (primary) hypertension: Secondary | ICD-10-CM | POA: Diagnosis not present

## 2019-11-22 DIAGNOSIS — I890 Lymphedema, not elsewhere classified: Secondary | ICD-10-CM | POA: Diagnosis not present

## 2019-11-22 DIAGNOSIS — I110 Hypertensive heart disease with heart failure: Secondary | ICD-10-CM | POA: Diagnosis not present

## 2019-11-22 DIAGNOSIS — G9341 Metabolic encephalopathy: Secondary | ICD-10-CM | POA: Diagnosis not present

## 2019-11-22 DIAGNOSIS — J9621 Acute and chronic respiratory failure with hypoxia: Secondary | ICD-10-CM | POA: Diagnosis not present

## 2019-11-22 DIAGNOSIS — R6520 Severe sepsis without septic shock: Secondary | ICD-10-CM | POA: Diagnosis not present

## 2019-11-22 DIAGNOSIS — Z9981 Dependence on supplemental oxygen: Secondary | ICD-10-CM | POA: Diagnosis not present

## 2019-11-22 DIAGNOSIS — I872 Venous insufficiency (chronic) (peripheral): Secondary | ICD-10-CM | POA: Diagnosis not present

## 2019-11-22 DIAGNOSIS — A4189 Other specified sepsis: Secondary | ICD-10-CM | POA: Diagnosis not present

## 2019-11-22 NOTE — Patient Outreach (Signed)
[place] [facility]) Care Management  11/22/2019  SAMIKA VETSCH 1945/07/28 [id number]   CSW received referral on 11/21/2019 for transportation and home assistance.  CSW made contact with pt today and confirmed her identity.  CSW introduced self, role and reason for call.  Per pt, she lives with her 72yo husband and she is bedridden.  "I have to have someone with me all the time". Pt reports her husband is self employed and she has extended family (grandson, daughter, etc) who assist with caring for her as well. Pt reports she has used [facility] transportation in the past and her family uses a bed lift to get her out of bed and into wheelchair.  Pt knowledgeable about how to arrange for rides with [facility].  CSW inquired about pt and spouses income- they may be eligible for Medicaid and CSW offered to mail her an application to complete and mail in or deliver to local DSS office.  CSW educated her on the guidelines and based on their total base income (both receive Social Security) they may be eligible.  CSW discussed the benefits of Medicaid if they are approved which could provided additional in home support/services through [facility] and/or CAPS.   Pt appreciative of call and is reminded to expect the application in the mail in the next week,  CSW provided pt with her contact # and will plan a follow up call in 2 weeks.   Eduard Clos, MSW, [place] Worker  [place] [phone]

## 2019-11-27 DIAGNOSIS — G9341 Metabolic encephalopathy: Secondary | ICD-10-CM | POA: Diagnosis not present

## 2019-11-27 DIAGNOSIS — E1151 Type 2 diabetes mellitus with diabetic peripheral angiopathy without gangrene: Secondary | ICD-10-CM | POA: Diagnosis not present

## 2019-11-27 DIAGNOSIS — L89623 Pressure ulcer of left heel, stage 3: Secondary | ICD-10-CM | POA: Diagnosis not present

## 2019-11-27 DIAGNOSIS — L89322 Pressure ulcer of left buttock, stage 2: Secondary | ICD-10-CM | POA: Diagnosis not present

## 2019-11-27 DIAGNOSIS — I5033 Acute on chronic diastolic (congestive) heart failure: Secondary | ICD-10-CM | POA: Diagnosis not present

## 2019-11-27 DIAGNOSIS — Z9981 Dependence on supplemental oxygen: Secondary | ICD-10-CM | POA: Diagnosis not present

## 2019-11-27 DIAGNOSIS — D509 Iron deficiency anemia, unspecified: Secondary | ICD-10-CM | POA: Diagnosis not present

## 2019-11-27 DIAGNOSIS — R6520 Severe sepsis without septic shock: Secondary | ICD-10-CM | POA: Diagnosis not present

## 2019-11-27 DIAGNOSIS — I10 Essential (primary) hypertension: Secondary | ICD-10-CM | POA: Diagnosis not present

## 2019-11-27 DIAGNOSIS — I872 Venous insufficiency (chronic) (peripheral): Secondary | ICD-10-CM | POA: Diagnosis not present

## 2019-11-27 DIAGNOSIS — L89612 Pressure ulcer of right heel, stage 2: Secondary | ICD-10-CM | POA: Diagnosis not present

## 2019-11-27 DIAGNOSIS — J9621 Acute and chronic respiratory failure with hypoxia: Secondary | ICD-10-CM | POA: Diagnosis not present

## 2019-11-27 DIAGNOSIS — I890 Lymphedema, not elsewhere classified: Secondary | ICD-10-CM | POA: Diagnosis not present

## 2019-11-27 DIAGNOSIS — I110 Hypertensive heart disease with heart failure: Secondary | ICD-10-CM | POA: Diagnosis not present

## 2019-11-27 DIAGNOSIS — A4189 Other specified sepsis: Secondary | ICD-10-CM | POA: Diagnosis not present

## 2019-11-27 DIAGNOSIS — Z794 Long term (current) use of insulin: Secondary | ICD-10-CM | POA: Diagnosis not present

## 2019-11-28 DIAGNOSIS — Z794 Long term (current) use of insulin: Secondary | ICD-10-CM | POA: Diagnosis not present

## 2019-11-28 DIAGNOSIS — I872 Venous insufficiency (chronic) (peripheral): Secondary | ICD-10-CM | POA: Diagnosis not present

## 2019-11-28 DIAGNOSIS — L89612 Pressure ulcer of right heel, stage 2: Secondary | ICD-10-CM | POA: Diagnosis not present

## 2019-11-28 DIAGNOSIS — J9621 Acute and chronic respiratory failure with hypoxia: Secondary | ICD-10-CM | POA: Diagnosis not present

## 2019-11-28 DIAGNOSIS — G9341 Metabolic encephalopathy: Secondary | ICD-10-CM | POA: Diagnosis not present

## 2019-11-28 DIAGNOSIS — I890 Lymphedema, not elsewhere classified: Secondary | ICD-10-CM | POA: Diagnosis not present

## 2019-11-28 DIAGNOSIS — R6520 Severe sepsis without septic shock: Secondary | ICD-10-CM | POA: Diagnosis not present

## 2019-11-28 DIAGNOSIS — I10 Essential (primary) hypertension: Secondary | ICD-10-CM | POA: Diagnosis not present

## 2019-11-28 DIAGNOSIS — I110 Hypertensive heart disease with heart failure: Secondary | ICD-10-CM | POA: Diagnosis not present

## 2019-11-28 DIAGNOSIS — L89322 Pressure ulcer of left buttock, stage 2: Secondary | ICD-10-CM | POA: Diagnosis not present

## 2019-11-28 DIAGNOSIS — Z9981 Dependence on supplemental oxygen: Secondary | ICD-10-CM | POA: Diagnosis not present

## 2019-11-28 DIAGNOSIS — E1151 Type 2 diabetes mellitus with diabetic peripheral angiopathy without gangrene: Secondary | ICD-10-CM | POA: Diagnosis not present

## 2019-11-28 DIAGNOSIS — D509 Iron deficiency anemia, unspecified: Secondary | ICD-10-CM | POA: Diagnosis not present

## 2019-11-28 DIAGNOSIS — A4189 Other specified sepsis: Secondary | ICD-10-CM | POA: Diagnosis not present

## 2019-11-28 DIAGNOSIS — L89623 Pressure ulcer of left heel, stage 3: Secondary | ICD-10-CM | POA: Diagnosis not present

## 2019-11-28 DIAGNOSIS — I5033 Acute on chronic diastolic (congestive) heart failure: Secondary | ICD-10-CM | POA: Diagnosis not present

## 2019-11-30 ENCOUNTER — Other Ambulatory Visit: Payer: Self-pay

## 2019-11-30 NOTE — Patient Outreach (Signed)
[place] [facility]) Care Management  11/30/2019  Cassandra Barnes [birth year] [id number]   Telephone assessment:  Placed follow up call to patient who reports she is feeling good. Reports CBG today of 259.  States her breathing is the same.  Reports no readmissions to the hospital. Reports her bottom is healing. Reports she continues to work with PT and do her home exercises. Patient denies any other problems or concerns today.   PLAN: will follow up in 10 days.  Tomasa Rand, RN, BSN, CEN [facility] [employer] [phone]

## 2019-12-01 ENCOUNTER — Ambulatory Visit: Payer: Self-pay

## 2019-12-01 DIAGNOSIS — I10 Essential (primary) hypertension: Secondary | ICD-10-CM | POA: Diagnosis not present

## 2019-12-01 DIAGNOSIS — I5033 Acute on chronic diastolic (congestive) heart failure: Secondary | ICD-10-CM | POA: Diagnosis not present

## 2019-12-01 DIAGNOSIS — D509 Iron deficiency anemia, unspecified: Secondary | ICD-10-CM | POA: Diagnosis not present

## 2019-12-01 DIAGNOSIS — A4189 Other specified sepsis: Secondary | ICD-10-CM | POA: Diagnosis not present

## 2019-12-01 DIAGNOSIS — L89612 Pressure ulcer of right heel, stage 2: Secondary | ICD-10-CM | POA: Diagnosis not present

## 2019-12-01 DIAGNOSIS — E1151 Type 2 diabetes mellitus with diabetic peripheral angiopathy without gangrene: Secondary | ICD-10-CM | POA: Diagnosis not present

## 2019-12-01 DIAGNOSIS — L89322 Pressure ulcer of left buttock, stage 2: Secondary | ICD-10-CM | POA: Diagnosis not present

## 2019-12-01 DIAGNOSIS — G9341 Metabolic encephalopathy: Secondary | ICD-10-CM | POA: Diagnosis not present

## 2019-12-01 DIAGNOSIS — I890 Lymphedema, not elsewhere classified: Secondary | ICD-10-CM | POA: Diagnosis not present

## 2019-12-01 DIAGNOSIS — J9621 Acute and chronic respiratory failure with hypoxia: Secondary | ICD-10-CM | POA: Diagnosis not present

## 2019-12-01 DIAGNOSIS — R6520 Severe sepsis without septic shock: Secondary | ICD-10-CM | POA: Diagnosis not present

## 2019-12-01 DIAGNOSIS — Z794 Long term (current) use of insulin: Secondary | ICD-10-CM | POA: Diagnosis not present

## 2019-12-01 DIAGNOSIS — I110 Hypertensive heart disease with heart failure: Secondary | ICD-10-CM | POA: Diagnosis not present

## 2019-12-01 DIAGNOSIS — I872 Venous insufficiency (chronic) (peripheral): Secondary | ICD-10-CM | POA: Diagnosis not present

## 2019-12-01 DIAGNOSIS — Z9981 Dependence on supplemental oxygen: Secondary | ICD-10-CM | POA: Diagnosis not present

## 2019-12-01 DIAGNOSIS — L89623 Pressure ulcer of left heel, stage 3: Secondary | ICD-10-CM | POA: Diagnosis not present

## 2019-12-02 DIAGNOSIS — I739 Peripheral vascular disease, unspecified: Secondary | ICD-10-CM | POA: Diagnosis not present

## 2019-12-02 DIAGNOSIS — J449 Chronic obstructive pulmonary disease, unspecified: Secondary | ICD-10-CM | POA: Diagnosis not present

## 2019-12-02 DIAGNOSIS — I509 Heart failure, unspecified: Secondary | ICD-10-CM | POA: Diagnosis not present

## 2019-12-04 ENCOUNTER — Other Ambulatory Visit: Payer: Self-pay | Admitting: *Deleted
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
## 2019-12-06 DIAGNOSIS — R6520 Severe sepsis without septic shock: Secondary | ICD-10-CM | POA: Diagnosis not present

## 2019-12-06 DIAGNOSIS — I872 Venous insufficiency (chronic) (peripheral): Secondary | ICD-10-CM | POA: Diagnosis not present

## 2019-12-06 DIAGNOSIS — G9341 Metabolic encephalopathy: Secondary | ICD-10-CM | POA: Diagnosis not present

## 2019-12-06 DIAGNOSIS — L89623 Pressure ulcer of left heel, stage 3: Secondary | ICD-10-CM | POA: Diagnosis not present

## 2019-12-06 DIAGNOSIS — E1151 Type 2 diabetes mellitus with diabetic peripheral angiopathy without gangrene: Secondary | ICD-10-CM | POA: Diagnosis not present

## 2019-12-06 DIAGNOSIS — I890 Lymphedema, not elsewhere classified: Secondary | ICD-10-CM | POA: Diagnosis not present

## 2019-12-06 DIAGNOSIS — D509 Iron deficiency anemia, unspecified: Secondary | ICD-10-CM | POA: Diagnosis not present

## 2019-12-06 DIAGNOSIS — A4189 Other specified sepsis: Secondary | ICD-10-CM | POA: Diagnosis not present

## 2019-12-06 DIAGNOSIS — J9621 Acute and chronic respiratory failure with hypoxia: Secondary | ICD-10-CM | POA: Diagnosis not present

## 2019-12-06 DIAGNOSIS — I5033 Acute on chronic diastolic (congestive) heart failure: Secondary | ICD-10-CM | POA: Diagnosis not present

## 2019-12-06 DIAGNOSIS — I110 Hypertensive heart disease with heart failure: Secondary | ICD-10-CM | POA: Diagnosis not present

## 2019-12-06 DIAGNOSIS — L89612 Pressure ulcer of right heel, stage 2: Secondary | ICD-10-CM | POA: Diagnosis not present

## 2019-12-06 DIAGNOSIS — Z794 Long term (current) use of insulin: Secondary | ICD-10-CM | POA: Diagnosis not present

## 2019-12-06 DIAGNOSIS — Z9981 Dependence on supplemental oxygen: Secondary | ICD-10-CM | POA: Diagnosis not present

## 2019-12-07 DIAGNOSIS — G9341 Metabolic encephalopathy: Secondary | ICD-10-CM | POA: Diagnosis not present

## 2019-12-07 DIAGNOSIS — R6520 Severe sepsis without septic shock: Secondary | ICD-10-CM | POA: Diagnosis not present

## 2019-12-07 DIAGNOSIS — L89612 Pressure ulcer of right heel, stage 2: Secondary | ICD-10-CM | POA: Diagnosis not present

## 2019-12-07 DIAGNOSIS — L89623 Pressure ulcer of left heel, stage 3: Secondary | ICD-10-CM | POA: Diagnosis not present

## 2019-12-07 DIAGNOSIS — J9621 Acute and chronic respiratory failure with hypoxia: Secondary | ICD-10-CM | POA: Diagnosis not present

## 2019-12-07 DIAGNOSIS — I110 Hypertensive heart disease with heart failure: Secondary | ICD-10-CM | POA: Diagnosis not present

## 2019-12-07 DIAGNOSIS — Z9981 Dependence on supplemental oxygen: Secondary | ICD-10-CM | POA: Diagnosis not present

## 2019-12-07 DIAGNOSIS — J449 Chronic obstructive pulmonary disease, unspecified: Secondary | ICD-10-CM | POA: Diagnosis not present

## 2019-12-07 DIAGNOSIS — I872 Venous insufficiency (chronic) (peripheral): Secondary | ICD-10-CM | POA: Diagnosis not present

## 2019-12-07 DIAGNOSIS — Z794 Long term (current) use of insulin: Secondary | ICD-10-CM | POA: Diagnosis not present

## 2019-12-07 DIAGNOSIS — D509 Iron deficiency anemia, unspecified: Secondary | ICD-10-CM | POA: Diagnosis not present

## 2019-12-07 DIAGNOSIS — A4189 Other specified sepsis: Secondary | ICD-10-CM | POA: Diagnosis not present

## 2019-12-07 DIAGNOSIS — I890 Lymphedema, not elsewhere classified: Secondary | ICD-10-CM | POA: Diagnosis not present

## 2019-12-07 DIAGNOSIS — E1151 Type 2 diabetes mellitus with diabetic peripheral angiopathy without gangrene: Secondary | ICD-10-CM | POA: Diagnosis not present

## 2019-12-07 DIAGNOSIS — I5033 Acute on chronic diastolic (congestive) heart failure: Secondary | ICD-10-CM | POA: Diagnosis not present

## 2019-12-13 DIAGNOSIS — A4189 Other specified sepsis: Secondary | ICD-10-CM | POA: Diagnosis not present

## 2019-12-13 DIAGNOSIS — L89623 Pressure ulcer of left heel, stage 3: Secondary | ICD-10-CM | POA: Diagnosis not present

## 2019-12-13 DIAGNOSIS — I110 Hypertensive heart disease with heart failure: Secondary | ICD-10-CM | POA: Diagnosis not present

## 2019-12-13 DIAGNOSIS — Z794 Long term (current) use of insulin: Secondary | ICD-10-CM | POA: Diagnosis not present

## 2019-12-13 DIAGNOSIS — R6520 Severe sepsis without septic shock: Secondary | ICD-10-CM | POA: Diagnosis not present

## 2019-12-13 DIAGNOSIS — Z9981 Dependence on supplemental oxygen: Secondary | ICD-10-CM | POA: Diagnosis not present

## 2019-12-13 DIAGNOSIS — G9341 Metabolic encephalopathy: Secondary | ICD-10-CM | POA: Diagnosis not present

## 2019-12-13 DIAGNOSIS — I890 Lymphedema, not elsewhere classified: Secondary | ICD-10-CM | POA: Diagnosis not present

## 2019-12-13 DIAGNOSIS — I5033 Acute on chronic diastolic (congestive) heart failure: Secondary | ICD-10-CM | POA: Diagnosis not present

## 2019-12-13 DIAGNOSIS — J9621 Acute and chronic respiratory failure with hypoxia: Secondary | ICD-10-CM | POA: Diagnosis not present

## 2019-12-13 DIAGNOSIS — L89612 Pressure ulcer of right heel, stage 2: Secondary | ICD-10-CM | POA: Diagnosis not present

## 2019-12-13 DIAGNOSIS — D509 Iron deficiency anemia, unspecified: Secondary | ICD-10-CM | POA: Diagnosis not present

## 2019-12-13 DIAGNOSIS — E1151 Type 2 diabetes mellitus with diabetic peripheral angiopathy without gangrene: Secondary | ICD-10-CM | POA: Diagnosis not present

## 2019-12-13 DIAGNOSIS — I872 Venous insufficiency (chronic) (peripheral): Secondary | ICD-10-CM | POA: Diagnosis not present

## 2019-12-14 DIAGNOSIS — G9341 Metabolic encephalopathy: Secondary | ICD-10-CM | POA: Diagnosis not present

## 2019-12-14 DIAGNOSIS — E1151 Type 2 diabetes mellitus with diabetic peripheral angiopathy without gangrene: Secondary | ICD-10-CM | POA: Diagnosis not present

## 2019-12-14 DIAGNOSIS — J9621 Acute and chronic respiratory failure with hypoxia: Secondary | ICD-10-CM | POA: Diagnosis not present

## 2019-12-14 DIAGNOSIS — I110 Hypertensive heart disease with heart failure: Secondary | ICD-10-CM | POA: Diagnosis not present

## 2019-12-14 DIAGNOSIS — Z9981 Dependence on supplemental oxygen: Secondary | ICD-10-CM | POA: Diagnosis not present

## 2019-12-14 DIAGNOSIS — L89623 Pressure ulcer of left heel, stage 3: Secondary | ICD-10-CM | POA: Diagnosis not present

## 2019-12-14 DIAGNOSIS — Z794 Long term (current) use of insulin: Secondary | ICD-10-CM | POA: Diagnosis not present

## 2019-12-14 DIAGNOSIS — I872 Venous insufficiency (chronic) (peripheral): Secondary | ICD-10-CM | POA: Diagnosis not present

## 2019-12-14 DIAGNOSIS — I890 Lymphedema, not elsewhere classified: Secondary | ICD-10-CM | POA: Diagnosis not present

## 2019-12-14 DIAGNOSIS — R6520 Severe sepsis without septic shock: Secondary | ICD-10-CM | POA: Diagnosis not present

## 2019-12-14 DIAGNOSIS — A4189 Other specified sepsis: Secondary | ICD-10-CM | POA: Diagnosis not present

## 2019-12-14 DIAGNOSIS — L89612 Pressure ulcer of right heel, stage 2: Secondary | ICD-10-CM | POA: Diagnosis not present

## 2019-12-14 DIAGNOSIS — D509 Iron deficiency anemia, unspecified: Secondary | ICD-10-CM | POA: Diagnosis not present

## 2019-12-14 DIAGNOSIS — I5033 Acute on chronic diastolic (congestive) heart failure: Secondary | ICD-10-CM | POA: Diagnosis not present

## 2019-12-18 DIAGNOSIS — E1151 Type 2 diabetes mellitus with diabetic peripheral angiopathy without gangrene: Secondary | ICD-10-CM | POA: Diagnosis not present

## 2019-12-18 DIAGNOSIS — E785 Hyperlipidemia, unspecified: Secondary | ICD-10-CM | POA: Diagnosis not present

## 2019-12-18 DIAGNOSIS — K219 Gastro-esophageal reflux disease without esophagitis: Secondary | ICD-10-CM | POA: Diagnosis not present

## 2019-12-18 DIAGNOSIS — I890 Lymphedema, not elsewhere classified: Secondary | ICD-10-CM | POA: Diagnosis not present

## 2019-12-18 DIAGNOSIS — G9341 Metabolic encephalopathy: Secondary | ICD-10-CM | POA: Diagnosis not present

## 2019-12-18 DIAGNOSIS — R6520 Severe sepsis without septic shock: Secondary | ICD-10-CM | POA: Diagnosis not present

## 2019-12-18 DIAGNOSIS — I5033 Acute on chronic diastolic (congestive) heart failure: Secondary | ICD-10-CM | POA: Diagnosis not present

## 2019-12-18 DIAGNOSIS — Z9981 Dependence on supplemental oxygen: Secondary | ICD-10-CM | POA: Diagnosis not present

## 2019-12-18 DIAGNOSIS — L89623 Pressure ulcer of left heel, stage 3: Secondary | ICD-10-CM | POA: Diagnosis not present

## 2019-12-18 DIAGNOSIS — J449 Chronic obstructive pulmonary disease, unspecified: Secondary | ICD-10-CM | POA: Diagnosis not present

## 2019-12-18 DIAGNOSIS — Z794 Long term (current) use of insulin: Secondary | ICD-10-CM | POA: Diagnosis not present

## 2019-12-18 DIAGNOSIS — J9621 Acute and chronic respiratory failure with hypoxia: Secondary | ICD-10-CM | POA: Diagnosis not present

## 2019-12-18 DIAGNOSIS — L89612 Pressure ulcer of right heel, stage 2: Secondary | ICD-10-CM | POA: Diagnosis not present

## 2019-12-18 DIAGNOSIS — D509 Iron deficiency anemia, unspecified: Secondary | ICD-10-CM | POA: Diagnosis not present

## 2019-12-18 DIAGNOSIS — E1343 Other specified diabetes mellitus with diabetic autonomic (poly)neuropathy: Secondary | ICD-10-CM | POA: Diagnosis not present

## 2019-12-18 DIAGNOSIS — A4189 Other specified sepsis: Secondary | ICD-10-CM | POA: Diagnosis not present

## 2019-12-18 DIAGNOSIS — I110 Hypertensive heart disease with heart failure: Secondary | ICD-10-CM | POA: Diagnosis not present

## 2019-12-18 DIAGNOSIS — I4510 Unspecified right bundle-branch block: Secondary | ICD-10-CM | POA: Diagnosis not present

## 2019-12-18 DIAGNOSIS — I872 Venous insufficiency (chronic) (peripheral): Secondary | ICD-10-CM | POA: Diagnosis not present

## 2019-12-20 DIAGNOSIS — L89623 Pressure ulcer of left heel, stage 3: Secondary | ICD-10-CM | POA: Diagnosis not present

## 2019-12-20 DIAGNOSIS — D509 Iron deficiency anemia, unspecified: Secondary | ICD-10-CM | POA: Diagnosis not present

## 2019-12-20 DIAGNOSIS — L89610 Pressure ulcer of right heel, unstageable: Secondary | ICD-10-CM | POA: Diagnosis not present

## 2019-12-20 DIAGNOSIS — A4189 Other specified sepsis: Secondary | ICD-10-CM | POA: Diagnosis not present

## 2019-12-20 DIAGNOSIS — G9341 Metabolic encephalopathy: Secondary | ICD-10-CM | POA: Diagnosis not present

## 2019-12-20 DIAGNOSIS — J9621 Acute and chronic respiratory failure with hypoxia: Secondary | ICD-10-CM | POA: Diagnosis not present

## 2019-12-20 DIAGNOSIS — I872 Venous insufficiency (chronic) (peripheral): Secondary | ICD-10-CM | POA: Diagnosis not present

## 2019-12-20 DIAGNOSIS — J449 Chronic obstructive pulmonary disease, unspecified: Secondary | ICD-10-CM | POA: Diagnosis not present

## 2019-12-20 DIAGNOSIS — I110 Hypertensive heart disease with heart failure: Secondary | ICD-10-CM | POA: Diagnosis not present

## 2019-12-20 DIAGNOSIS — R6520 Severe sepsis without septic shock: Secondary | ICD-10-CM | POA: Diagnosis not present

## 2019-12-20 DIAGNOSIS — I5032 Chronic diastolic (congestive) heart failure: Secondary | ICD-10-CM | POA: Diagnosis not present

## 2019-12-20 DIAGNOSIS — Z794 Long term (current) use of insulin: Secondary | ICD-10-CM | POA: Diagnosis not present

## 2019-12-20 DIAGNOSIS — Z9981 Dependence on supplemental oxygen: Secondary | ICD-10-CM | POA: Diagnosis not present

## 2019-12-20 DIAGNOSIS — E1151 Type 2 diabetes mellitus with diabetic peripheral angiopathy without gangrene: Secondary | ICD-10-CM | POA: Diagnosis not present

## 2019-12-20 DIAGNOSIS — I5033 Acute on chronic diastolic (congestive) heart failure: Secondary | ICD-10-CM | POA: Diagnosis not present

## 2019-12-20 DIAGNOSIS — L89612 Pressure ulcer of right heel, stage 2: Secondary | ICD-10-CM | POA: Diagnosis not present

## 2019-12-20 DIAGNOSIS — I890 Lymphedema, not elsewhere classified: Secondary | ICD-10-CM | POA: Diagnosis not present

## 2019-12-21 ENCOUNTER — Other Ambulatory Visit: Payer: Self-pay | Admitting: *Deleted

## 2019-12-21 NOTE — Patient Outreach (Signed)
[place] [facility]) Care Management  12/21/2019  XOCHITL EGLE [birth year] [id number]   CSW contacted pt who reports she is uncertain of the status of the Medicaid application, stating; "my daughter was handling that".  CSW stressed to pt the importance of getting it completed and sent to DSS for determination.  CSW made contact with daughter, Tessie Fass, as well today by phone and reminded her to contact me once they have word on the Medicaid approval- if approved, she can get some in home support (PCS, CAPS).    CSW will sign off at this time.  CSW will update PCP and [facility] team.   Eduard Clos, MSW, [place] Worker  [place] [phone]

## 2019-12-25 ENCOUNTER — Other Ambulatory Visit: Payer: Self-pay

## 2019-12-26 NOTE — Patient Outreach (Signed)
[place] [facility]) Care Management  12/26/2019  Cassandra Barnes [birth year] [id number]   Telephone assessment:  Placed call to patient who answered and reports she is doing well. Reports her grandson is assisting with care giving during the day and daughter at night. Patient reports wounds to buttocks are healed. Reports wounds to legs are healing as well. Reports she is doing very well. Taking her medications as prescribed. Denies any new problems or concerns.  PLAN: follow up call in 2 week. Tomasa Rand, RN, BSN, CEN [facility] [employer] [phone]

## 2019-12-27 DIAGNOSIS — D509 Iron deficiency anemia, unspecified: Secondary | ICD-10-CM | POA: Diagnosis not present

## 2019-12-27 DIAGNOSIS — L89612 Pressure ulcer of right heel, stage 2: Secondary | ICD-10-CM | POA: Diagnosis not present

## 2019-12-27 DIAGNOSIS — G9341 Metabolic encephalopathy: Secondary | ICD-10-CM | POA: Diagnosis not present

## 2019-12-27 DIAGNOSIS — J9621 Acute and chronic respiratory failure with hypoxia: Secondary | ICD-10-CM | POA: Diagnosis not present

## 2019-12-27 DIAGNOSIS — A4189 Other specified sepsis: Secondary | ICD-10-CM | POA: Diagnosis not present

## 2019-12-27 DIAGNOSIS — Z9981 Dependence on supplemental oxygen: Secondary | ICD-10-CM | POA: Diagnosis not present

## 2019-12-27 DIAGNOSIS — I110 Hypertensive heart disease with heart failure: Secondary | ICD-10-CM | POA: Diagnosis not present

## 2019-12-27 DIAGNOSIS — R6520 Severe sepsis without septic shock: Secondary | ICD-10-CM | POA: Diagnosis not present

## 2019-12-27 DIAGNOSIS — I890 Lymphedema, not elsewhere classified: Secondary | ICD-10-CM | POA: Diagnosis not present

## 2019-12-27 DIAGNOSIS — Z794 Long term (current) use of insulin: Secondary | ICD-10-CM | POA: Diagnosis not present

## 2019-12-27 DIAGNOSIS — I872 Venous insufficiency (chronic) (peripheral): Secondary | ICD-10-CM | POA: Diagnosis not present

## 2019-12-27 DIAGNOSIS — I5033 Acute on chronic diastolic (congestive) heart failure: Secondary | ICD-10-CM | POA: Diagnosis not present

## 2019-12-27 DIAGNOSIS — E1151 Type 2 diabetes mellitus with diabetic peripheral angiopathy without gangrene: Secondary | ICD-10-CM | POA: Diagnosis not present

## 2019-12-27 DIAGNOSIS — L89623 Pressure ulcer of left heel, stage 3: Secondary | ICD-10-CM | POA: Diagnosis not present

## 2019-12-29 DIAGNOSIS — L89612 Pressure ulcer of right heel, stage 2: Secondary | ICD-10-CM | POA: Diagnosis not present

## 2019-12-29 DIAGNOSIS — Z9981 Dependence on supplemental oxygen: Secondary | ICD-10-CM | POA: Diagnosis not present

## 2019-12-29 DIAGNOSIS — D509 Iron deficiency anemia, unspecified: Secondary | ICD-10-CM | POA: Diagnosis not present

## 2019-12-29 DIAGNOSIS — Z794 Long term (current) use of insulin: Secondary | ICD-10-CM | POA: Diagnosis not present

## 2019-12-29 DIAGNOSIS — I110 Hypertensive heart disease with heart failure: Secondary | ICD-10-CM | POA: Diagnosis not present

## 2019-12-29 DIAGNOSIS — I872 Venous insufficiency (chronic) (peripheral): Secondary | ICD-10-CM | POA: Diagnosis not present

## 2019-12-29 DIAGNOSIS — R6520 Severe sepsis without septic shock: Secondary | ICD-10-CM | POA: Diagnosis not present

## 2019-12-29 DIAGNOSIS — E1151 Type 2 diabetes mellitus with diabetic peripheral angiopathy without gangrene: Secondary | ICD-10-CM | POA: Diagnosis not present

## 2019-12-29 DIAGNOSIS — J9621 Acute and chronic respiratory failure with hypoxia: Secondary | ICD-10-CM | POA: Diagnosis not present

## 2019-12-29 DIAGNOSIS — L89623 Pressure ulcer of left heel, stage 3: Secondary | ICD-10-CM | POA: Diagnosis not present

## 2019-12-29 DIAGNOSIS — A4189 Other specified sepsis: Secondary | ICD-10-CM | POA: Diagnosis not present

## 2019-12-29 DIAGNOSIS — I890 Lymphedema, not elsewhere classified: Secondary | ICD-10-CM | POA: Diagnosis not present

## 2019-12-29 DIAGNOSIS — G9341 Metabolic encephalopathy: Secondary | ICD-10-CM | POA: Diagnosis not present

## 2019-12-29 DIAGNOSIS — I5033 Acute on chronic diastolic (congestive) heart failure: Secondary | ICD-10-CM | POA: Diagnosis not present

## 2020-01-01 DIAGNOSIS — I4510 Unspecified right bundle-branch block: Secondary | ICD-10-CM | POA: Diagnosis not present

## 2020-01-01 DIAGNOSIS — I872 Venous insufficiency (chronic) (peripheral): Secondary | ICD-10-CM | POA: Diagnosis not present

## 2020-01-01 DIAGNOSIS — L89623 Pressure ulcer of left heel, stage 3: Secondary | ICD-10-CM | POA: Diagnosis not present

## 2020-01-01 DIAGNOSIS — R6520 Severe sepsis without septic shock: Secondary | ICD-10-CM | POA: Diagnosis not present

## 2020-01-01 DIAGNOSIS — E1151 Type 2 diabetes mellitus with diabetic peripheral angiopathy without gangrene: Secondary | ICD-10-CM | POA: Diagnosis not present

## 2020-01-01 DIAGNOSIS — L89612 Pressure ulcer of right heel, stage 2: Secondary | ICD-10-CM | POA: Diagnosis not present

## 2020-01-01 DIAGNOSIS — Z9981 Dependence on supplemental oxygen: Secondary | ICD-10-CM | POA: Diagnosis not present

## 2020-01-01 DIAGNOSIS — A4189 Other specified sepsis: Secondary | ICD-10-CM | POA: Diagnosis not present

## 2020-01-01 DIAGNOSIS — G9341 Metabolic encephalopathy: Secondary | ICD-10-CM | POA: Diagnosis not present

## 2020-01-01 DIAGNOSIS — J449 Chronic obstructive pulmonary disease, unspecified: Secondary | ICD-10-CM | POA: Diagnosis not present

## 2020-01-01 DIAGNOSIS — Z794 Long term (current) use of insulin: Secondary | ICD-10-CM | POA: Diagnosis not present

## 2020-01-01 DIAGNOSIS — K219 Gastro-esophageal reflux disease without esophagitis: Secondary | ICD-10-CM | POA: Diagnosis not present

## 2020-01-01 DIAGNOSIS — E1343 Other specified diabetes mellitus with diabetic autonomic (poly)neuropathy: Secondary | ICD-10-CM | POA: Diagnosis not present

## 2020-01-01 DIAGNOSIS — I110 Hypertensive heart disease with heart failure: Secondary | ICD-10-CM | POA: Diagnosis not present

## 2020-01-01 DIAGNOSIS — J9621 Acute and chronic respiratory failure with hypoxia: Secondary | ICD-10-CM | POA: Diagnosis not present

## 2020-01-01 DIAGNOSIS — E785 Hyperlipidemia, unspecified: Secondary | ICD-10-CM | POA: Diagnosis not present

## 2020-01-01 DIAGNOSIS — I890 Lymphedema, not elsewhere classified: Secondary | ICD-10-CM | POA: Diagnosis not present

## 2020-01-01 DIAGNOSIS — I5033 Acute on chronic diastolic (congestive) heart failure: Secondary | ICD-10-CM | POA: Diagnosis not present

## 2020-01-01 DIAGNOSIS — D509 Iron deficiency anemia, unspecified: Secondary | ICD-10-CM | POA: Diagnosis not present

## 2020-01-02 DIAGNOSIS — I509 Heart failure, unspecified: Secondary | ICD-10-CM | POA: Diagnosis not present

## 2020-01-02 DIAGNOSIS — I739 Peripheral vascular disease, unspecified: Secondary | ICD-10-CM | POA: Diagnosis not present

## 2020-01-02 DIAGNOSIS — J449 Chronic obstructive pulmonary disease, unspecified: Secondary | ICD-10-CM | POA: Diagnosis not present

## 2020-01-03 DIAGNOSIS — J9621 Acute and chronic respiratory failure with hypoxia: Secondary | ICD-10-CM | POA: Diagnosis not present

## 2020-01-03 DIAGNOSIS — I872 Venous insufficiency (chronic) (peripheral): Secondary | ICD-10-CM | POA: Diagnosis not present

## 2020-01-03 DIAGNOSIS — D509 Iron deficiency anemia, unspecified: Secondary | ICD-10-CM | POA: Diagnosis not present

## 2020-01-03 DIAGNOSIS — I110 Hypertensive heart disease with heart failure: Secondary | ICD-10-CM | POA: Diagnosis not present

## 2020-01-03 DIAGNOSIS — E1151 Type 2 diabetes mellitus with diabetic peripheral angiopathy without gangrene: Secondary | ICD-10-CM | POA: Diagnosis not present

## 2020-01-03 DIAGNOSIS — G9341 Metabolic encephalopathy: Secondary | ICD-10-CM | POA: Diagnosis not present

## 2020-01-03 DIAGNOSIS — Z9981 Dependence on supplemental oxygen: Secondary | ICD-10-CM | POA: Diagnosis not present

## 2020-01-03 DIAGNOSIS — Z794 Long term (current) use of insulin: Secondary | ICD-10-CM | POA: Diagnosis not present

## 2020-01-03 DIAGNOSIS — L89612 Pressure ulcer of right heel, stage 2: Secondary | ICD-10-CM | POA: Diagnosis not present

## 2020-01-03 DIAGNOSIS — I5033 Acute on chronic diastolic (congestive) heart failure: Secondary | ICD-10-CM | POA: Diagnosis not present

## 2020-01-03 DIAGNOSIS — A4189 Other specified sepsis: Secondary | ICD-10-CM | POA: Diagnosis not present

## 2020-01-03 DIAGNOSIS — I890 Lymphedema, not elsewhere classified: Secondary | ICD-10-CM | POA: Diagnosis not present

## 2020-01-03 DIAGNOSIS — L89623 Pressure ulcer of left heel, stage 3: Secondary | ICD-10-CM | POA: Diagnosis not present

## 2020-01-03 DIAGNOSIS — R6520 Severe sepsis without septic shock: Secondary | ICD-10-CM | POA: Diagnosis not present

## 2020-01-04 DIAGNOSIS — A4189 Other specified sepsis: Secondary | ICD-10-CM | POA: Diagnosis not present

## 2020-01-04 DIAGNOSIS — L89612 Pressure ulcer of right heel, stage 2: Secondary | ICD-10-CM | POA: Diagnosis not present

## 2020-01-04 DIAGNOSIS — Z794 Long term (current) use of insulin: Secondary | ICD-10-CM | POA: Diagnosis not present

## 2020-01-04 DIAGNOSIS — L89623 Pressure ulcer of left heel, stage 3: Secondary | ICD-10-CM | POA: Diagnosis not present

## 2020-01-04 DIAGNOSIS — Z9981 Dependence on supplemental oxygen: Secondary | ICD-10-CM | POA: Diagnosis not present

## 2020-01-04 DIAGNOSIS — I890 Lymphedema, not elsewhere classified: Secondary | ICD-10-CM | POA: Diagnosis not present

## 2020-01-04 DIAGNOSIS — G9341 Metabolic encephalopathy: Secondary | ICD-10-CM | POA: Diagnosis not present

## 2020-01-04 DIAGNOSIS — E1151 Type 2 diabetes mellitus with diabetic peripheral angiopathy without gangrene: Secondary | ICD-10-CM | POA: Diagnosis not present

## 2020-01-04 DIAGNOSIS — I872 Venous insufficiency (chronic) (peripheral): Secondary | ICD-10-CM | POA: Diagnosis not present

## 2020-01-04 DIAGNOSIS — I110 Hypertensive heart disease with heart failure: Secondary | ICD-10-CM | POA: Diagnosis not present

## 2020-01-04 DIAGNOSIS — D509 Iron deficiency anemia, unspecified: Secondary | ICD-10-CM | POA: Diagnosis not present

## 2020-01-04 DIAGNOSIS — J9621 Acute and chronic respiratory failure with hypoxia: Secondary | ICD-10-CM | POA: Diagnosis not present

## 2020-01-04 DIAGNOSIS — I5033 Acute on chronic diastolic (congestive) heart failure: Secondary | ICD-10-CM | POA: Diagnosis not present

## 2020-01-04 DIAGNOSIS — R6520 Severe sepsis without septic shock: Secondary | ICD-10-CM | POA: Diagnosis not present

## 2020-01-06 DIAGNOSIS — J449 Chronic obstructive pulmonary disease, unspecified: Secondary | ICD-10-CM | POA: Diagnosis not present

## 2020-01-11 DIAGNOSIS — I872 Venous insufficiency (chronic) (peripheral): Secondary | ICD-10-CM | POA: Diagnosis not present

## 2020-01-11 DIAGNOSIS — E1151 Type 2 diabetes mellitus with diabetic peripheral angiopathy without gangrene: Secondary | ICD-10-CM | POA: Diagnosis not present

## 2020-01-11 DIAGNOSIS — G9341 Metabolic encephalopathy: Secondary | ICD-10-CM | POA: Diagnosis not present

## 2020-01-11 DIAGNOSIS — I110 Hypertensive heart disease with heart failure: Secondary | ICD-10-CM | POA: Diagnosis not present

## 2020-01-11 DIAGNOSIS — Z9981 Dependence on supplemental oxygen: Secondary | ICD-10-CM | POA: Diagnosis not present

## 2020-01-11 DIAGNOSIS — L89623 Pressure ulcer of left heel, stage 3: Secondary | ICD-10-CM | POA: Diagnosis not present

## 2020-01-11 DIAGNOSIS — I890 Lymphedema, not elsewhere classified: Secondary | ICD-10-CM | POA: Diagnosis not present

## 2020-01-11 DIAGNOSIS — I5033 Acute on chronic diastolic (congestive) heart failure: Secondary | ICD-10-CM | POA: Diagnosis not present

## 2020-01-11 DIAGNOSIS — L89612 Pressure ulcer of right heel, stage 2: Secondary | ICD-10-CM | POA: Diagnosis not present

## 2020-01-11 DIAGNOSIS — Z794 Long term (current) use of insulin: Secondary | ICD-10-CM | POA: Diagnosis not present

## 2020-01-11 DIAGNOSIS — J9621 Acute and chronic respiratory failure with hypoxia: Secondary | ICD-10-CM | POA: Diagnosis not present

## 2020-01-11 DIAGNOSIS — D509 Iron deficiency anemia, unspecified: Secondary | ICD-10-CM | POA: Diagnosis not present

## 2020-01-11 DIAGNOSIS — A4189 Other specified sepsis: Secondary | ICD-10-CM | POA: Diagnosis not present

## 2020-01-11 DIAGNOSIS — R6520 Severe sepsis without septic shock: Secondary | ICD-10-CM | POA: Diagnosis not present

## 2020-01-12 ENCOUNTER — Other Ambulatory Visit: Payer: Self-pay

## 2020-01-12 NOTE — Patient Outreach (Signed)
[place] [facility]) Care Management  01/12/2020  Cassandra Barnes [birth year] [id number]   Telephone assessment:  Placed call to patient with no answer.  PLAN: will mail outreach letter and attempt contact again in 3-4 days.   Tomasa Rand, RN, BSN, CEN [facility] [employer] [phone]

## 2020-01-13 DIAGNOSIS — G9341 Metabolic encephalopathy: Secondary | ICD-10-CM | POA: Diagnosis not present

## 2020-01-13 DIAGNOSIS — I872 Venous insufficiency (chronic) (peripheral): Secondary | ICD-10-CM | POA: Diagnosis not present

## 2020-01-13 DIAGNOSIS — L89623 Pressure ulcer of left heel, stage 3: Secondary | ICD-10-CM | POA: Diagnosis not present

## 2020-01-13 DIAGNOSIS — A4189 Other specified sepsis: Secondary | ICD-10-CM | POA: Diagnosis not present

## 2020-01-13 DIAGNOSIS — R6520 Severe sepsis without septic shock: Secondary | ICD-10-CM | POA: Diagnosis not present

## 2020-01-13 DIAGNOSIS — L89612 Pressure ulcer of right heel, stage 2: Secondary | ICD-10-CM | POA: Diagnosis not present

## 2020-01-13 DIAGNOSIS — D509 Iron deficiency anemia, unspecified: Secondary | ICD-10-CM | POA: Diagnosis not present

## 2020-01-13 DIAGNOSIS — J9621 Acute and chronic respiratory failure with hypoxia: Secondary | ICD-10-CM | POA: Diagnosis not present

## 2020-01-13 DIAGNOSIS — Z794 Long term (current) use of insulin: Secondary | ICD-10-CM | POA: Diagnosis not present

## 2020-01-13 DIAGNOSIS — E1151 Type 2 diabetes mellitus with diabetic peripheral angiopathy without gangrene: Secondary | ICD-10-CM | POA: Diagnosis not present

## 2020-01-13 DIAGNOSIS — Z9981 Dependence on supplemental oxygen: Secondary | ICD-10-CM | POA: Diagnosis not present

## 2020-01-13 DIAGNOSIS — I5033 Acute on chronic diastolic (congestive) heart failure: Secondary | ICD-10-CM | POA: Diagnosis not present

## 2020-01-13 DIAGNOSIS — I110 Hypertensive heart disease with heart failure: Secondary | ICD-10-CM | POA: Diagnosis not present

## 2020-01-13 DIAGNOSIS — I890 Lymphedema, not elsewhere classified: Secondary | ICD-10-CM | POA: Diagnosis not present

## 2020-01-15 DIAGNOSIS — L89612 Pressure ulcer of right heel, stage 2: Secondary | ICD-10-CM | POA: Diagnosis not present

## 2020-01-15 DIAGNOSIS — G9341 Metabolic encephalopathy: Secondary | ICD-10-CM | POA: Diagnosis not present

## 2020-01-15 DIAGNOSIS — Z794 Long term (current) use of insulin: Secondary | ICD-10-CM | POA: Diagnosis not present

## 2020-01-15 DIAGNOSIS — I5033 Acute on chronic diastolic (congestive) heart failure: Secondary | ICD-10-CM | POA: Diagnosis not present

## 2020-01-15 DIAGNOSIS — Z9981 Dependence on supplemental oxygen: Secondary | ICD-10-CM | POA: Diagnosis not present

## 2020-01-15 DIAGNOSIS — R6520 Severe sepsis without septic shock: Secondary | ICD-10-CM | POA: Diagnosis not present

## 2020-01-15 DIAGNOSIS — I872 Venous insufficiency (chronic) (peripheral): Secondary | ICD-10-CM | POA: Diagnosis not present

## 2020-01-15 DIAGNOSIS — J9621 Acute and chronic respiratory failure with hypoxia: Secondary | ICD-10-CM | POA: Diagnosis not present

## 2020-01-15 DIAGNOSIS — I890 Lymphedema, not elsewhere classified: Secondary | ICD-10-CM | POA: Diagnosis not present

## 2020-01-15 DIAGNOSIS — E1151 Type 2 diabetes mellitus with diabetic peripheral angiopathy without gangrene: Secondary | ICD-10-CM | POA: Diagnosis not present

## 2020-01-15 DIAGNOSIS — I110 Hypertensive heart disease with heart failure: Secondary | ICD-10-CM | POA: Diagnosis not present

## 2020-01-15 DIAGNOSIS — L89623 Pressure ulcer of left heel, stage 3: Secondary | ICD-10-CM | POA: Diagnosis not present

## 2020-01-15 DIAGNOSIS — A4189 Other specified sepsis: Secondary | ICD-10-CM | POA: Diagnosis not present

## 2020-01-15 DIAGNOSIS — D509 Iron deficiency anemia, unspecified: Secondary | ICD-10-CM | POA: Diagnosis not present

## 2020-01-17 ENCOUNTER — Other Ambulatory Visit: Payer: Self-pay

## 2020-01-17 NOTE — Patient Outreach (Signed)
[place] [facility]) Care Management  01/17/2020  LINDA GRIMMER 07-Sep-1945 [id number]   Telephone assessment and case closure:  Place call to patient who answered and reports she is doing well. Reports no changes in condition. Reports CBG is up and down. Reports no swelling. Reports breathing is   " good" .     Reviewed with patient again about the importance of follow up with MD. Reports she had an appointment with MD at 10 am and [facility] came at 7am and she was still in the bed.  I offered to call and arrange follow up again and patient declines and states that her daughter would do it. I encouraged patient to follow up as soon as possible.  PLAN: reviewed goals with patient to avoid admissions and readmissions and she has.  No new needs identified by patient. Patient agrees to case closure.  Will mail case closure letter to patient and MD.   Tomasa Rand, RN, BSN, CEN [place] Coordinator [phone]

## 2020-01-18 DIAGNOSIS — L89612 Pressure ulcer of right heel, stage 2: Secondary | ICD-10-CM | POA: Diagnosis not present

## 2020-01-18 DIAGNOSIS — Z9981 Dependence on supplemental oxygen: Secondary | ICD-10-CM | POA: Diagnosis not present

## 2020-01-18 DIAGNOSIS — L89623 Pressure ulcer of left heel, stage 3: Secondary | ICD-10-CM | POA: Diagnosis not present

## 2020-01-18 DIAGNOSIS — G9341 Metabolic encephalopathy: Secondary | ICD-10-CM | POA: Diagnosis not present

## 2020-01-18 DIAGNOSIS — E1151 Type 2 diabetes mellitus with diabetic peripheral angiopathy without gangrene: Secondary | ICD-10-CM | POA: Diagnosis not present

## 2020-01-18 DIAGNOSIS — D509 Iron deficiency anemia, unspecified: Secondary | ICD-10-CM | POA: Diagnosis not present

## 2020-01-18 DIAGNOSIS — I110 Hypertensive heart disease with heart failure: Secondary | ICD-10-CM | POA: Diagnosis not present

## 2020-01-18 DIAGNOSIS — R6520 Severe sepsis without septic shock: Secondary | ICD-10-CM | POA: Diagnosis not present

## 2020-01-18 DIAGNOSIS — I872 Venous insufficiency (chronic) (peripheral): Secondary | ICD-10-CM | POA: Diagnosis not present

## 2020-01-18 DIAGNOSIS — Z794 Long term (current) use of insulin: Secondary | ICD-10-CM | POA: Diagnosis not present

## 2020-01-18 DIAGNOSIS — J9621 Acute and chronic respiratory failure with hypoxia: Secondary | ICD-10-CM | POA: Diagnosis not present

## 2020-01-18 DIAGNOSIS — A4189 Other specified sepsis: Secondary | ICD-10-CM | POA: Diagnosis not present

## 2020-01-18 DIAGNOSIS — I5033 Acute on chronic diastolic (congestive) heart failure: Secondary | ICD-10-CM | POA: Diagnosis not present

## 2020-01-18 DIAGNOSIS — I890 Lymphedema, not elsewhere classified: Secondary | ICD-10-CM | POA: Diagnosis not present

## 2020-01-19 DIAGNOSIS — I5032 Chronic diastolic (congestive) heart failure: Secondary | ICD-10-CM | POA: Diagnosis not present

## 2020-01-19 DIAGNOSIS — L89610 Pressure ulcer of right heel, unstageable: Secondary | ICD-10-CM | POA: Diagnosis not present

## 2020-01-19 DIAGNOSIS — J449 Chronic obstructive pulmonary disease, unspecified: Secondary | ICD-10-CM | POA: Diagnosis not present

## 2020-01-19 DIAGNOSIS — I110 Hypertensive heart disease with heart failure: Secondary | ICD-10-CM | POA: Diagnosis not present

## 2020-01-24 DIAGNOSIS — G9341 Metabolic encephalopathy: Secondary | ICD-10-CM | POA: Diagnosis not present

## 2020-01-24 DIAGNOSIS — E1151 Type 2 diabetes mellitus with diabetic peripheral angiopathy without gangrene: Secondary | ICD-10-CM | POA: Diagnosis not present

## 2020-01-24 DIAGNOSIS — I872 Venous insufficiency (chronic) (peripheral): Secondary | ICD-10-CM | POA: Diagnosis not present

## 2020-01-24 DIAGNOSIS — R6520 Severe sepsis without septic shock: Secondary | ICD-10-CM | POA: Diagnosis not present

## 2020-01-24 DIAGNOSIS — Z794 Long term (current) use of insulin: Secondary | ICD-10-CM | POA: Diagnosis not present

## 2020-01-24 DIAGNOSIS — Z9981 Dependence on supplemental oxygen: Secondary | ICD-10-CM | POA: Diagnosis not present

## 2020-01-24 DIAGNOSIS — L89612 Pressure ulcer of right heel, stage 2: Secondary | ICD-10-CM | POA: Diagnosis not present

## 2020-01-24 DIAGNOSIS — J9621 Acute and chronic respiratory failure with hypoxia: Secondary | ICD-10-CM | POA: Diagnosis not present

## 2020-01-24 DIAGNOSIS — L89623 Pressure ulcer of left heel, stage 3: Secondary | ICD-10-CM | POA: Diagnosis not present

## 2020-01-24 DIAGNOSIS — D509 Iron deficiency anemia, unspecified: Secondary | ICD-10-CM | POA: Diagnosis not present

## 2020-01-24 DIAGNOSIS — I890 Lymphedema, not elsewhere classified: Secondary | ICD-10-CM | POA: Diagnosis not present

## 2020-01-24 DIAGNOSIS — I5033 Acute on chronic diastolic (congestive) heart failure: Secondary | ICD-10-CM | POA: Diagnosis not present

## 2020-01-24 DIAGNOSIS — I110 Hypertensive heart disease with heart failure: Secondary | ICD-10-CM | POA: Diagnosis not present

## 2020-01-24 DIAGNOSIS — A4189 Other specified sepsis: Secondary | ICD-10-CM | POA: Diagnosis not present

## 2020-01-29 DIAGNOSIS — Z9981 Dependence on supplemental oxygen: Secondary | ICD-10-CM | POA: Diagnosis not present

## 2020-01-29 DIAGNOSIS — J9621 Acute and chronic respiratory failure with hypoxia: Secondary | ICD-10-CM | POA: Diagnosis not present

## 2020-01-29 DIAGNOSIS — I5033 Acute on chronic diastolic (congestive) heart failure: Secondary | ICD-10-CM | POA: Diagnosis not present

## 2020-01-29 DIAGNOSIS — Z794 Long term (current) use of insulin: Secondary | ICD-10-CM | POA: Diagnosis not present

## 2020-01-29 DIAGNOSIS — I872 Venous insufficiency (chronic) (peripheral): Secondary | ICD-10-CM | POA: Diagnosis not present

## 2020-01-29 DIAGNOSIS — G9341 Metabolic encephalopathy: Secondary | ICD-10-CM | POA: Diagnosis not present

## 2020-01-29 DIAGNOSIS — L89612 Pressure ulcer of right heel, stage 2: Secondary | ICD-10-CM | POA: Diagnosis not present

## 2020-01-29 DIAGNOSIS — I110 Hypertensive heart disease with heart failure: Secondary | ICD-10-CM | POA: Diagnosis not present

## 2020-01-29 DIAGNOSIS — A4189 Other specified sepsis: Secondary | ICD-10-CM | POA: Diagnosis not present

## 2020-01-29 DIAGNOSIS — L89623 Pressure ulcer of left heel, stage 3: Secondary | ICD-10-CM | POA: Diagnosis not present

## 2020-01-29 DIAGNOSIS — I890 Lymphedema, not elsewhere classified: Secondary | ICD-10-CM | POA: Diagnosis not present

## 2020-01-29 DIAGNOSIS — E1151 Type 2 diabetes mellitus with diabetic peripheral angiopathy without gangrene: Secondary | ICD-10-CM | POA: Diagnosis not present

## 2020-01-29 DIAGNOSIS — D509 Iron deficiency anemia, unspecified: Secondary | ICD-10-CM | POA: Diagnosis not present

## 2020-01-29 DIAGNOSIS — R6520 Severe sepsis without septic shock: Secondary | ICD-10-CM | POA: Diagnosis not present

## 2020-01-30 ENCOUNTER — Ambulatory Visit: Payer: Medicare Other | Admitting: Sports Medicine

## 2020-02-01 DIAGNOSIS — I890 Lymphedema, not elsewhere classified: Secondary | ICD-10-CM | POA: Diagnosis not present

## 2020-02-01 DIAGNOSIS — J9621 Acute and chronic respiratory failure with hypoxia: Secondary | ICD-10-CM | POA: Diagnosis not present

## 2020-02-01 DIAGNOSIS — I739 Peripheral vascular disease, unspecified: Secondary | ICD-10-CM | POA: Diagnosis not present

## 2020-02-01 DIAGNOSIS — I872 Venous insufficiency (chronic) (peripheral): Secondary | ICD-10-CM | POA: Diagnosis not present

## 2020-02-01 DIAGNOSIS — I5033 Acute on chronic diastolic (congestive) heart failure: Secondary | ICD-10-CM | POA: Diagnosis not present

## 2020-02-01 DIAGNOSIS — I110 Hypertensive heart disease with heart failure: Secondary | ICD-10-CM | POA: Diagnosis not present

## 2020-02-01 DIAGNOSIS — L89623 Pressure ulcer of left heel, stage 3: Secondary | ICD-10-CM | POA: Diagnosis not present

## 2020-02-01 DIAGNOSIS — Z9981 Dependence on supplemental oxygen: Secondary | ICD-10-CM | POA: Diagnosis not present

## 2020-02-01 DIAGNOSIS — R6520 Severe sepsis without septic shock: Secondary | ICD-10-CM | POA: Diagnosis not present

## 2020-02-01 DIAGNOSIS — G9341 Metabolic encephalopathy: Secondary | ICD-10-CM | POA: Diagnosis not present

## 2020-02-01 DIAGNOSIS — Z794 Long term (current) use of insulin: Secondary | ICD-10-CM | POA: Diagnosis not present

## 2020-02-01 DIAGNOSIS — D509 Iron deficiency anemia, unspecified: Secondary | ICD-10-CM | POA: Diagnosis not present

## 2020-02-01 DIAGNOSIS — E1151 Type 2 diabetes mellitus with diabetic peripheral angiopathy without gangrene: Secondary | ICD-10-CM | POA: Diagnosis not present

## 2020-02-01 DIAGNOSIS — L89612 Pressure ulcer of right heel, stage 2: Secondary | ICD-10-CM | POA: Diagnosis not present

## 2020-02-01 DIAGNOSIS — I509 Heart failure, unspecified: Secondary | ICD-10-CM | POA: Diagnosis not present

## 2020-02-01 DIAGNOSIS — A4189 Other specified sepsis: Secondary | ICD-10-CM | POA: Diagnosis not present

## 2020-02-01 DIAGNOSIS — J449 Chronic obstructive pulmonary disease, unspecified: Secondary | ICD-10-CM | POA: Diagnosis not present

## 2020-02-06 DIAGNOSIS — J449 Chronic obstructive pulmonary disease, unspecified: Secondary | ICD-10-CM | POA: Diagnosis not present

## 2020-05-25 DIAGNOSIS — J9612 Chronic respiratory failure with hypercapnia: Secondary | ICD-10-CM

## 2020-10-04 DEATH — deceased

## 2020-12-26 IMAGING — US IR ANGIO/EXT/BI
1 series · 4 of 4 positions shown · non-contrast
Comparison: none

INDICATION: 73-year-old female with a history of right heel wound, critical limb
ischemia, Tessie 6 category symptoms. She presents today for
right lower extremity angiogram and possible intervention.

[Series 1: ir angio/ext/bi · 4 of 4 slices shown]
[im 1/4]
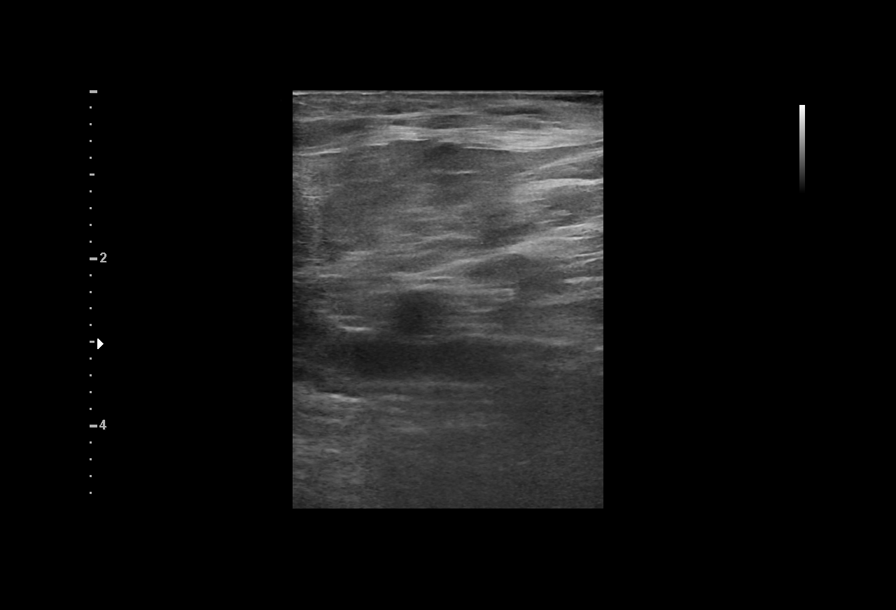
[im 2/4]
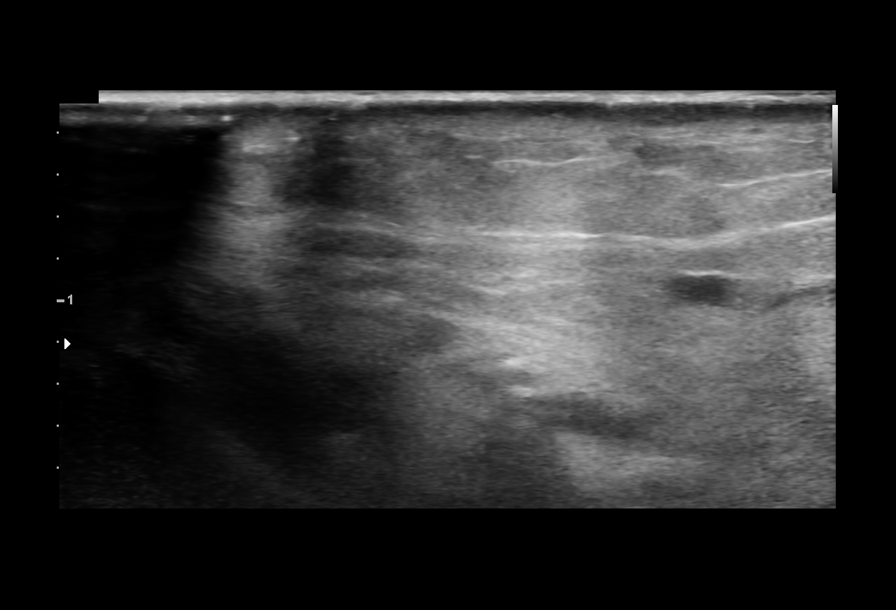
[im 3/4]
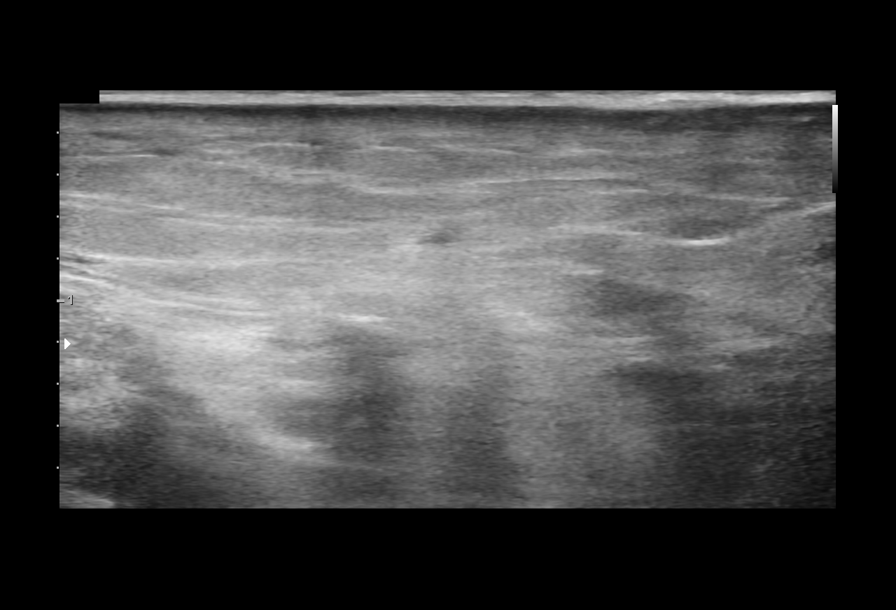
[im 4/4]
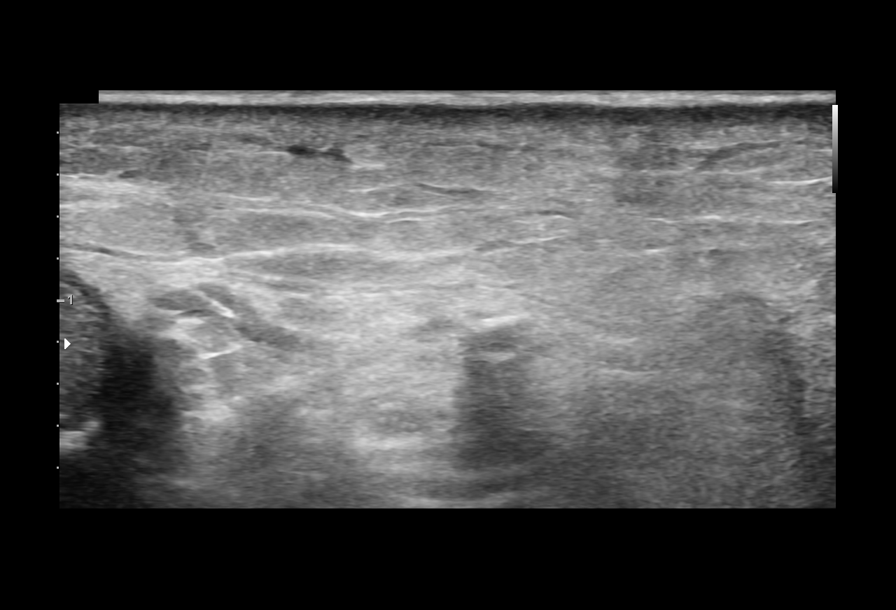

[4 of 4 positions shown; findings below may reference images not displayed]

EXAM:
ULTRASOUND-GUIDED ACCESS LEFT COMMON FEMORAL ARTERY

PELVIC ANGIOGRAM AND RIGHT LOWER EXTREMITY ANGIOGRAM

TREATMENT OF CRITICAL STENOSIS OF RIGHT SUPERFICIAL FEMORAL ARTERY
WITH DRUG-ELUTING BALLOON TECHNOLOGY

ANGIO-SEAL FOR HEMOSTASIS.

MEDICATIONS:
61111 units IV heparin, 50 mg IV protamine sulfate

ANESTHESIA/SEDATION:
Moderate (conscious) sedation was employed during this procedure. A
total of Versed 0 mg and Fentanyl 75 mcg was administered
intravenously.

Moderate Sedation Time: 0 minutes. The patient's level of
consciousness and vital signs were monitored continuously by
radiology nursing throughout the procedure under my direct
supervision.

CONTRAST:  96 cc intra arterial contrast

FLUOROSCOPY TIME:  Fluoroscopy Time: 6 minutes 12 seconds (259 mGy).

COMPLICATIONS:
None



Ultrasound survey of the left inguinal region was performed with
images stored and sent to PACs, confirming patency of the vessel.

1% lidocaine was used for local anesthesia. Small stab incision was
made with 11 blade scalpel. Blunt dissection was performed. A
micropuncture needle was used access the left common femoral artery
under ultrasound. With excellent arterial blood flow returned, and
an .018 micro wire was passed through the needle, observed enter the
abdominal aorta under fluoroscopy. The needle was removed, and a
micropuncture sheath was placed over the wire. The inner dilator and
wire were removed, and an 035 Bentson wire was advanced under
fluoroscopy into the abdominal aorta. The sheath was removed and a
standard 5 French vascular sheath was placed. The dilator was
removed and the sheath was flushed.

Angiogram of the lower aorta and pelvis performed.

Omni Flush catheter was used to navigate the Bentson wire into the
right iliac system. Omni Flush catheter was removed and a 100 cm
vertebral catheter was advanced over the wire to the distal external
iliac artery.

Wire was removed and angiogram was performed of the right lower
extremity.

Systemic heparinization was then performed with [place] of 61111
units heparin.

Glidewire was used to navigate the catheter into the proximal
superficial femoral artery. Glidewire was removed and a rose in wire
was placed in the proximal SFA. The catheter was removed and then
the short 5 French sheath was exchanged for a 45 cm straight tip
destination 7 French sheath.

Once the sheath was in place, combination of a Glidewire and the
vertebral catheter were used to cross the proximal SFA critical
stenosis. With the catheter distal to the stenosis, the Rose an wire
was placed into the SFA. Crossing catheter was removed.

Standard balloon angioplasty was then performed at the critical
stenosis, 5 mm x 40 mm.

A second narrowing proximal to the critical stenosis was treated
with 5 mm x 40 mm balloon angioplasty.

5 mm x 80 mm drug-eluting balloon was then used to treat this
irregular segment, with 3 minutes time interval observed with the
inflated balloon.

Balloon was removed and a final image was stored.

Completion angiogram was performed of the right lower extremity

Given the location of the wound, and attempt was made to
revascularize the posterior tibial artery. No origin of the artery
was identified on the angiogram, and so we did not attempt and
antegrade approach to the artery. An attempt at pedal puncture for
retrograde approach was performed. After local anesthesia with 1%
lidocaine was applied, 3 attempts for puncturing the common plantar
artery performed, which ultimately was unsuccessful.

After confirming excellent pulse at the posterior tibial artery, we
withdrew from the case.

Six French Angio-Seal was deployed on the left for hemostasis.

Patient tolerated the procedure well and remained hemodynamically
stable throughout.

No complications were encountered and no significant blood loss.
FINDINGS: Ultrasound demonstrates patency of the left common femoral artery

Pelvic angiogram demonstrates mild irregular plaque at the distal
aorta with no aneurysm.

Normal course caliber and contour of the bilateral iliac arteries
with mild atherosclerosis. Bilateral hypogastric arteries are patent
as well as the anterior and posterior division.

No significant stenosis of the external iliac artery identified.

Right lower extremity angiogram demonstrates mild atherosclerosis of
the common femoral artery with patent profunda femoris and thigh
branches.

Critical stenosis in the proximal segment of the superficial femoral
artery, short segment. Moderate atherosclerotic changes through the
remainder of the superficial femoral artery which is patent through
the popliteal artery.

Trifurcation is patent with patency of the anterior tibial artery
from the origin to the ankle. Moderate atherosclerotic changes with
no high-grade stenosis or occlusion of the anterior tibial artery.
Dorsalis pedis remains patent to the pedal loop.

Peroneal artery is patent as these only patent branch from the
posterior tibial artery.

The proximal posterior tibial artery is not identified.

The distal posterior tibial artery is patent via collateral flow
from a calcaneal branch. Common plantar artery patent.

After treatment of short segment critical stenosis with 5 mm balloon
angioplasty there is resolution of the stenosis with no residual
stenosis.

Excellent pulses at the completion of the exam.
IMPRESSION: Status post ultrasound-guided access left common femoral artery for
pelvic and right lower extremity angiogram with treatment of a short
segment critical stenosis of the right superficial femoral artery
with 5 mm drug-eluting balloon technology.
